# Patient Record
Sex: Female | Born: 1945 | Race: White | Hispanic: No | Smoking: Former smoker
Health system: Southern US, Community
[De-identification: ages and names within clinical notes are randomized; demographics above are authoritative.]

## PROBLEM LIST (undated history)

## (undated) ENCOUNTER — Emergency Department (HOSPITAL_BASED_OUTPATIENT_CLINIC_OR_DEPARTMENT_OTHER): Payer: Medicare Other

## (undated) DIAGNOSIS — M1990 Unspecified osteoarthritis, unspecified site: Secondary | ICD-10-CM

## (undated) DIAGNOSIS — E785 Hyperlipidemia, unspecified: Secondary | ICD-10-CM

## (undated) DIAGNOSIS — G609 Hereditary and idiopathic neuropathy, unspecified: Secondary | ICD-10-CM

## (undated) DIAGNOSIS — G629 Polyneuropathy, unspecified: Secondary | ICD-10-CM

## (undated) DIAGNOSIS — R519 Headache, unspecified: Secondary | ICD-10-CM

## (undated) DIAGNOSIS — T7840XA Allergy, unspecified, initial encounter: Secondary | ICD-10-CM

## (undated) DIAGNOSIS — I10 Essential (primary) hypertension: Secondary | ICD-10-CM

## (undated) DIAGNOSIS — R51 Headache: Secondary | ICD-10-CM

## (undated) DIAGNOSIS — H269 Unspecified cataract: Secondary | ICD-10-CM

## (undated) DIAGNOSIS — F1190 Opioid use, unspecified, uncomplicated: Secondary | ICD-10-CM

## (undated) DIAGNOSIS — E876 Hypokalemia: Secondary | ICD-10-CM

## (undated) DIAGNOSIS — Z973 Presence of spectacles and contact lenses: Secondary | ICD-10-CM

## (undated) HISTORY — PX: EYE SURGERY: SHX253

## (undated) HISTORY — DX: Unspecified cataract: H26.9

## (undated) HISTORY — DX: Polyneuropathy, unspecified: G62.9

## (undated) HISTORY — DX: Essential (primary) hypertension: I10

## (undated) HISTORY — PX: SHOULDER OPEN ROTATOR CUFF REPAIR: SHX2407

## (undated) HISTORY — DX: Hyperlipidemia, unspecified: E78.5

## (undated) HISTORY — PX: KNEE ARTHROSCOPY: SUR90

## (undated) HISTORY — PX: ABDOMINAL HYSTERECTOMY: SHX81

## (undated) HISTORY — DX: Allergy, unspecified, initial encounter: T78.40XA

## (undated) HISTORY — PX: TONSILLECTOMY: SUR1361

## (undated) HISTORY — PX: CATARACT EXTRACTION: SUR2

---

## 2011-04-05 DIAGNOSIS — E785 Hyperlipidemia, unspecified: Secondary | ICD-10-CM | POA: Diagnosis not present

## 2011-04-05 DIAGNOSIS — Z79899 Other long term (current) drug therapy: Secondary | ICD-10-CM | POA: Diagnosis not present

## 2011-04-05 DIAGNOSIS — J209 Acute bronchitis, unspecified: Secondary | ICD-10-CM | POA: Diagnosis not present

## 2011-04-20 DIAGNOSIS — IMO0002 Reserved for concepts with insufficient information to code with codable children: Secondary | ICD-10-CM | POA: Diagnosis not present

## 2011-05-17 DIAGNOSIS — J209 Acute bronchitis, unspecified: Secondary | ICD-10-CM | POA: Diagnosis not present

## 2011-05-29 DIAGNOSIS — G589 Mononeuropathy, unspecified: Secondary | ICD-10-CM | POA: Diagnosis not present

## 2011-05-29 DIAGNOSIS — J209 Acute bronchitis, unspecified: Secondary | ICD-10-CM | POA: Diagnosis not present

## 2011-07-18 DIAGNOSIS — Z1272 Encounter for screening for malignant neoplasm of vagina: Secondary | ICD-10-CM | POA: Diagnosis not present

## 2011-07-18 DIAGNOSIS — Z01419 Encounter for gynecological examination (general) (routine) without abnormal findings: Secondary | ICD-10-CM | POA: Diagnosis not present

## 2011-08-14 DIAGNOSIS — E785 Hyperlipidemia, unspecified: Secondary | ICD-10-CM | POA: Diagnosis not present

## 2011-08-14 DIAGNOSIS — I10 Essential (primary) hypertension: Secondary | ICD-10-CM | POA: Diagnosis not present

## 2011-08-29 DIAGNOSIS — G609 Hereditary and idiopathic neuropathy, unspecified: Secondary | ICD-10-CM | POA: Diagnosis not present

## 2011-08-29 DIAGNOSIS — M1990 Unspecified osteoarthritis, unspecified site: Secondary | ICD-10-CM | POA: Diagnosis not present

## 2011-09-27 DIAGNOSIS — M1990 Unspecified osteoarthritis, unspecified site: Secondary | ICD-10-CM | POA: Diagnosis not present

## 2011-09-27 DIAGNOSIS — G609 Hereditary and idiopathic neuropathy, unspecified: Secondary | ICD-10-CM | POA: Diagnosis not present

## 2011-09-27 DIAGNOSIS — Z79899 Other long term (current) drug therapy: Secondary | ICD-10-CM | POA: Diagnosis not present

## 2011-10-25 DIAGNOSIS — E8941 Symptomatic postprocedural ovarian failure: Secondary | ICD-10-CM | POA: Diagnosis not present

## 2011-11-01 DIAGNOSIS — Z1231 Encounter for screening mammogram for malignant neoplasm of breast: Secondary | ICD-10-CM | POA: Diagnosis not present

## 2011-11-02 DIAGNOSIS — H18519 Endothelial corneal dystrophy, unspecified eye: Secondary | ICD-10-CM | POA: Diagnosis not present

## 2011-11-02 DIAGNOSIS — H524 Presbyopia: Secondary | ICD-10-CM | POA: Diagnosis not present

## 2011-11-02 DIAGNOSIS — H3129 Other hereditary choroidal dystrophy: Secondary | ICD-10-CM | POA: Diagnosis not present

## 2011-11-09 DIAGNOSIS — E559 Vitamin D deficiency, unspecified: Secondary | ICD-10-CM | POA: Diagnosis not present

## 2011-11-09 DIAGNOSIS — E785 Hyperlipidemia, unspecified: Secondary | ICD-10-CM | POA: Diagnosis not present

## 2011-11-13 DIAGNOSIS — I10 Essential (primary) hypertension: Secondary | ICD-10-CM | POA: Diagnosis not present

## 2011-11-13 DIAGNOSIS — E785 Hyperlipidemia, unspecified: Secondary | ICD-10-CM | POA: Diagnosis not present

## 2011-11-16 DIAGNOSIS — Z1212 Encounter for screening for malignant neoplasm of rectum: Secondary | ICD-10-CM | POA: Diagnosis not present

## 2011-11-23 DIAGNOSIS — L820 Inflamed seborrheic keratosis: Secondary | ICD-10-CM | POA: Diagnosis not present

## 2012-03-13 DIAGNOSIS — Z79899 Other long term (current) drug therapy: Secondary | ICD-10-CM | POA: Diagnosis not present

## 2012-03-13 DIAGNOSIS — G609 Hereditary and idiopathic neuropathy, unspecified: Secondary | ICD-10-CM | POA: Diagnosis not present

## 2012-03-13 DIAGNOSIS — M1990 Unspecified osteoarthritis, unspecified site: Secondary | ICD-10-CM | POA: Diagnosis not present

## 2012-04-11 DIAGNOSIS — H109 Unspecified conjunctivitis: Secondary | ICD-10-CM | POA: Diagnosis not present

## 2012-05-22 DIAGNOSIS — M1990 Unspecified osteoarthritis, unspecified site: Secondary | ICD-10-CM | POA: Diagnosis not present

## 2012-05-22 DIAGNOSIS — G609 Hereditary and idiopathic neuropathy, unspecified: Secondary | ICD-10-CM | POA: Diagnosis not present

## 2012-05-22 DIAGNOSIS — Z79899 Other long term (current) drug therapy: Secondary | ICD-10-CM | POA: Diagnosis not present

## 2012-06-12 DIAGNOSIS — M1990 Unspecified osteoarthritis, unspecified site: Secondary | ICD-10-CM | POA: Diagnosis not present

## 2012-06-12 DIAGNOSIS — Z79899 Other long term (current) drug therapy: Secondary | ICD-10-CM | POA: Diagnosis not present

## 2012-06-12 DIAGNOSIS — G609 Hereditary and idiopathic neuropathy, unspecified: Secondary | ICD-10-CM | POA: Diagnosis not present

## 2012-07-02 DIAGNOSIS — M79609 Pain in unspecified limb: Secondary | ICD-10-CM | POA: Diagnosis not present

## 2012-07-02 DIAGNOSIS — E1142 Type 2 diabetes mellitus with diabetic polyneuropathy: Secondary | ICD-10-CM | POA: Diagnosis not present

## 2012-07-17 ENCOUNTER — Ambulatory Visit (INDEPENDENT_AMBULATORY_CARE_PROVIDER_SITE_OTHER): Payer: Medicare Other | Admitting: Nurse Practitioner

## 2012-07-17 ENCOUNTER — Encounter: Payer: Self-pay | Admitting: Nurse Practitioner

## 2012-07-17 VITALS — BP 117/67 | HR 69 | Temp 99.0°F | Ht 68.4 in | Wt 191.0 lb

## 2012-07-17 DIAGNOSIS — G609 Hereditary and idiopathic neuropathy, unspecified: Secondary | ICD-10-CM

## 2012-07-17 DIAGNOSIS — I10 Essential (primary) hypertension: Secondary | ICD-10-CM | POA: Diagnosis not present

## 2012-07-17 DIAGNOSIS — Z23 Encounter for immunization: Secondary | ICD-10-CM

## 2012-07-17 DIAGNOSIS — E785 Hyperlipidemia, unspecified: Secondary | ICD-10-CM

## 2012-07-17 DIAGNOSIS — G43909 Migraine, unspecified, not intractable, without status migrainosus: Secondary | ICD-10-CM | POA: Diagnosis not present

## 2012-07-17 DIAGNOSIS — E876 Hypokalemia: Secondary | ICD-10-CM

## 2012-07-17 DIAGNOSIS — E782 Mixed hyperlipidemia: Secondary | ICD-10-CM | POA: Insufficient documentation

## 2012-07-17 LAB — COMPLETE METABOLIC PANEL WITH GFR
ALT: 30 U/L (ref 0–35)
AST: 26 U/L (ref 0–37)
Albumin: 4.2 g/dL (ref 3.5–5.2)
Alkaline Phosphatase: 59 U/L (ref 39–117)
BUN: 18 mg/dL (ref 6–23)
CO2: 31 mEq/L (ref 19–32)
Calcium: 9.5 mg/dL (ref 8.4–10.5)
Chloride: 102 mEq/L (ref 96–112)
Creat: 0.82 mg/dL (ref 0.50–1.10)
GFR, Est African American: 86 mL/min
GFR, Est Non African American: 75 mL/min
Glucose, Bld: 102 mg/dL — ABNORMAL HIGH (ref 70–99)
Potassium: 3.8 mEq/L (ref 3.5–5.3)
Sodium: 141 mEq/L (ref 135–145)
Total Bilirubin: 1.2 mg/dL (ref 0.3–1.2)
Total Protein: 5.9 g/dL — ABNORMAL LOW (ref 6.0–8.3)

## 2012-07-17 MED ORDER — ATORVASTATIN CALCIUM 40 MG PO TABS: 40.0000 mg | ORAL_TABLET | Freq: Every day | ORAL | Status: DC

## 2012-07-17 MED ORDER — ATENOLOL 50 MG PO TABS: 50.0000 mg | ORAL_TABLET | Freq: Every day | ORAL | Status: DC

## 2012-07-17 MED ORDER — POTASSIUM CHLORIDE CRYS ER 20 MEQ PO TBCR: 20.0000 meq | EXTENDED_RELEASE_TABLET | Freq: Every day | ORAL | Status: DC

## 2012-07-17 MED ORDER — TRIAMTERENE-HCTZ 37.5-25 MG PO TABS: 1.0000 | ORAL_TABLET | Freq: Every day | ORAL | Status: DC

## 2012-07-17 NOTE — Patient Instructions (Addendum)
Health Maintenance, Females A healthy lifestyle and preventative care can promote health and wellness.  Maintain regular health, dental, and eye exams.  Eat a healthy diet. Foods like vegetables, fruits, whole grains, low-fat dairy products, and lean protein foods contain the nutrients you need without too many calories. Decrease your intake of foods high in solid fats, added sugars, and salt. Get information about a proper diet from your caregiver, if necessary.  Regular physical exercise is one of the most important things you can do for your health. Most adults should get at least 150 minutes of moderate-intensity exercise (any activity that increases your heart rate and causes you to sweat) each week. In addition, most adults need muscle-strengthening exercises on 2 or more days a week.   Maintain a healthy weight. The body mass index (BMI) is a screening tool to identify possible weight problems. It provides an estimate of body fat based on height and weight. Your caregiver can help determine your BMI, and can help you achieve or maintain a healthy weight. For adults 20 years and older:  A BMI below 18.5 is considered underweight.  A BMI of 18.5 to 24.9 is normal.  A BMI of 25 to 29.9 is considered overweight.  A BMI of 30 and above is considered obese.  Maintain normal blood lipids and cholesterol by exercising and minimizing your intake of saturated fat. Eat a balanced diet with plenty of fruits and vegetables. Blood tests for lipids and cholesterol should begin at age 20 and be repeated every 5 years. If your lipid or cholesterol levels are high, you are over 50, or you are a high risk for heart disease, you may need your cholesterol levels checked more frequently.Ongoing high lipid and cholesterol levels should be treated with medicines if diet and exercise are not effective.  If you smoke, find out from your caregiver how to quit. If you do not use tobacco, do not start.  If you  are pregnant, do not drink alcohol. If you are breastfeeding, be very cautious about drinking alcohol. If you are not pregnant and choose to drink alcohol, do not exceed 1 drink per day. One drink is considered to be 12 ounces (355 mL) of beer, 5 ounces (148 mL) of wine, or 1.5 ounces (44 mL) of liquor.  Avoid use of street drugs. Do not share needles with anyone. Ask for help if you need support or instructions about stopping the use of drugs.  High blood pressure causes heart disease and increases the risk of stroke. Blood pressure should be checked at least every 1 to 2 years. Ongoing high blood pressure should be treated with medicines, if weight loss and exercise are not effective.  If you are 55 to 66 years old, ask your caregiver if you should take aspirin to prevent strokes.  Diabetes screening involves taking a blood sample to check your fasting blood sugar level. This should be done once every 3 years, after age 45, if you are within normal weight and without risk factors for diabetes. Testing should be considered at a younger age or be carried out more frequently if you are overweight and have at least 1 risk factor for diabetes.  Breast cancer screening is essential preventative care for women. You should practice "breast self-awareness." This means understanding the normal appearance and feel of your breasts and may include breast self-examination. Any changes detected, no matter how small, should be reported to a caregiver. Women in their 20s and 30s should have   a clinical breast exam (CBE) by a caregiver as part of a regular health exam every 1 to 3 years. After age 40, women should have a CBE every year. Starting at age 40, women should consider having a mammogram (breast X-ray) every year. Women who have a family history of breast cancer should talk to their caregiver about genetic screening. Women at a high risk of breast cancer should talk to their caregiver about having an MRI and a  mammogram every year.  The Pap test is a screening test for cervical cancer. Women should have a Pap test starting at age 21. Between ages 21 and 29, Pap tests should be repeated every 2 years. Beginning at age 30, you should have a Pap test every 3 years as long as the past 3 Pap tests have been normal. If you had a hysterectomy for a problem that was not cancer or a condition that could lead to cancer, then you no longer need Pap tests. If you are between ages 65 and 70, and you have had normal Pap tests going back 10 years, you no longer need Pap tests. If you have had past treatment for cervical cancer or a condition that could lead to cancer, you need Pap tests and screening for cancer for at least 20 years after your treatment. If Pap tests have been discontinued, risk factors (such as a new sexual partner) need to be reassessed to determine if screening should be resumed. Some women have medical problems that increase the chance of getting cervical cancer. In these cases, your caregiver may recommend more frequent screening and Pap tests.  The human papillomavirus (HPV) test is an additional test that may be used for cervical cancer screening. The HPV test looks for the virus that can cause the cell changes on the cervix. The cells collected during the Pap test can be tested for HPV. The HPV test could be used to screen women aged 30 years and older, and should be used in women of any age who have unclear Pap test results. After the age of 30, women should have HPV testing at the same frequency as a Pap test.  Colorectal cancer can be detected and often prevented. Most routine colorectal cancer screening begins at the age of 50 and continues through age 75. However, your caregiver may recommend screening at an earlier age if you have risk factors for colon cancer. On a yearly basis, your caregiver may provide home test kits to check for hidden blood in the stool. Use of a small camera at the end of a  tube, to directly examine the colon (sigmoidoscopy or colonoscopy), can detect the earliest forms of colorectal cancer. Talk to your caregiver about this at age 50, when routine screening begins. Direct examination of the colon should be repeated every 5 to 10 years through age 75, unless early forms of pre-cancerous polyps or small growths are found.  Hepatitis C blood testing is recommended for all people born from 1945 through 1965 and any individual with known risks for hepatitis C.  Practice safe sex. Use condoms and avoid high-risk sexual practices to reduce the spread of sexually transmitted infections (STIs). Sexually active women aged 25 and younger should be checked for Chlamydia, which is a common sexually transmitted infection. Older women with new or multiple partners should also be tested for Chlamydia. Testing for other STIs is recommended if you are sexually active and at increased risk.  Osteoporosis is a disease in which the   bones lose minerals and strength with aging. This can result in serious bone fractures. The risk of osteoporosis can be identified using a bone density scan. Women ages 63 and over and women at risk for fractures or osteoporosis should discuss screening with their caregivers. Ask your caregiver whether you should be taking a calcium supplement or vitamin D to reduce the rate of osteoporosis.  Menopause can be associated with physical symptoms and risks. Hormone replacement therapy is available to decrease symptoms and risks. You should talk to your caregiver about whether hormone replacement therapy is right for you.  Use sunscreen with a sun protection factor (SPF) of 30 or greater. Apply sunscreen liberally and repeatedly throughout the day. You should seek shade when your shadow is shorter than you. Protect yourself by wearing long sleeves, pants, a wide-brimmed hat, and sunglasses year round, whenever you are outdoors.  Notify your caregiver of new moles or  changes in moles, especially if there is a change in shape or color. Also notify your caregiver if a mole is larger than the size of a pencil eraser.  Stay current with your immunizations. Document Released: 09/26/2010 Document Revised: 06/05/2011 Document Reviewed: 09/26/2010 [facility] Patient Information 2013 [place], [place]. Pneumococcal Vaccine, Polyvalent solution for injection What is this medicine? PNEUMOCOCCAL VACCINE, POLYVALENT (NEU mo KOK al vak SEEN, pol ee VEY luhnt) is a vaccine to prevent pneumococcus bacteria infection. These bacteria are a major cause of ear infections, Strep throat infections, and serious pneumonia, meningitis, or blood infections worldwide. These vaccines help the body to produce antibodies (protective substances) that help your body defend against these bacteria. This vaccine is recommended for people 86 years of age and older with health problems. It is also recommended for all adults over 57 years old. This vaccine will not treat an infection. This medicine may be used for other purposes; ask your health care provider or pharmacist if you have questions. What should I tell my health care provider before I take this medicine? They need to know if you have any of these conditions: -bleeding problems -bone marrow or organ transplant -cancer, Hodgkin's disease -fever -infection -immune system problems -low platelet count in the blood -seizures -an unusual or allergic reaction to pneumococcal vaccine, diphtheria toxoid, other vaccines, latex, other medicines, foods, dyes, or preservatives -pregnant or trying to get pregnant -breast-feeding How should I use this medicine? This vaccine is for injection into a muscle or under the skin. It is given by a health care professional. A copy of Vaccine Information Statements will be given before each vaccination. Read this sheet carefully each time. The sheet may change frequently. Talk to your pediatrician regarding  the use of this medicine in children. While this drug may be prescribed for children as young as 67 years of age for selected conditions, precautions do apply. Overdosage: If you think you have taken too much of this medicine contact a poison control center or emergency room at once. NOTE: This medicine is only for you. Do not share this medicine with others. What if I miss a dose? It is important not to miss your dose. Call your doctor or health care professional if you are unable to keep an appointment. What may interact with this medicine? -medicines for cancer chemotherapy -medicines that suppress your immune function -medicines that treat or prevent blood clots like warfarin, enoxaparin, and dalteparin -steroid medicines like prednisone or cortisone This list may not describe all possible interactions. Give your health care provider a list of  all the medicines, herbs, non-prescription drugs, or dietary supplements you use. Also tell them if you smoke, drink alcohol, or use illegal drugs. Some items may interact with your medicine. What should I watch for while using this medicine? Mild fever and pain should go away in 3 days or less. Report any unusual symptoms to your doctor or health care professional. What side effects may I notice from receiving this medicine? Side effects that you should report to your doctor or health care professional as soon as possible: -allergic reactions like skin rash, itching or hives, swelling of the face, lips, or tongue -breathing problems -confused -fever over 102 degrees F -pain, tingling, numbness in the hands or feet -seizures -unusual bleeding or bruising -unusual muscle weakness Side effects that usually do not require medical attention (report to your doctor or health care professional if they continue or are bothersome): -aches and pains -diarrhea -fever of 102 degrees F or less -headache -irritable -loss of appetite -pain, tender at site  where injected -trouble sleeping This list may not describe all possible side effects. Call your doctor for medical advice about side effects. You may report side effects to FDA at 1-800-FDA-1088. Where should I keep my medicine? This does not apply. This vaccine is given in a clinic, pharmacy, doctor's office, or other health care setting and will not be stored at home. NOTE: This sheet is a summary. It may not cover all possible information. If you have questions about this medicine, talk to your doctor, pharmacist, or health care provider.  2013, [employer]. (10/18/2007 2:32:37 PM)

## 2012-07-17 NOTE — Progress Notes (Signed)
Subjective:    Patient ID: Brittany Mercer, female    DOB: 03-06-1946, 67 y.o.   MRN: [id number]  Hypertension This is a chronic problem. The current episode started more than 1 year ago. The problem is unchanged. The problem is controlled. Pertinent negatives include no blurred vision, chest pain, headaches, neck pain, palpitations, peripheral edema or shortness of breath. There are no associated agents to hypertension. Risk factors for coronary artery disease include dyslipidemia, post-menopausal state and stress. Past treatments include beta blockers and diuretics. The current treatment provides significant improvement. There are no compliance problems.   Hyperlipidemia This is a chronic problem. The current episode started more than 1 year ago. The problem is controlled. Recent lipid tests were reviewed and are normal. There are no known factors aggravating her hyperlipidemia. Pertinent negatives include no chest pain, focal weakness, leg pain, myalgias or shortness of breath. The current treatment provides significant improvement of lipids. There are no compliance problems.  Risk factors for coronary artery disease include hypertension and post-menopausal.  Hypokalemia Patient takes K+ daily. No c/o cramps in lower extremities Peripheral Neuropathy Nucyenta and oxycodone- Doing well- Patient in fight with ins to get nucyenta approved. Nucynta works better than oxycodone without the side effects.   Review of Systems  HENT: Negative for neck pain.   Eyes: Negative for blurred vision.  Respiratory: Negative for shortness of breath.   Cardiovascular: Negative for chest pain and palpitations.  Musculoskeletal: Negative for myalgias.  Neurological: Negative for focal weakness and headaches.  All other systems reviewed and are negative.       Objective:   Physical Exam  Constitutional: She is oriented to person, place, and time. She appears well-developed and well-nourished.  HENT:   Nose: Nose normal.  Mouth/Throat: Oropharynx is clear and moist.  Eyes: EOM are normal.  Neck: Trachea normal, normal range of motion and full passive range of motion without pain. Neck supple. No JVD present. Carotid bruit is not present. No thyromegaly present.  Cardiovascular: Normal rate, regular rhythm, normal heart sounds and intact distal pulses.  Exam reveals no gallop and no friction rub.   No murmur heard. Pulmonary/Chest: Effort normal and breath sounds normal.  Abdominal: Soft. Bowel sounds are normal. She exhibits no distension and no mass. There is no tenderness.  Musculoskeletal: Normal range of motion.  Lymphadenopathy:    She has no cervical adenopathy.  Neurological: She is alert and oriented to person, place, and time. She has normal reflexes.  Skin: Skin is warm and dry.  Psychiatric: She has a normal mood and affect. Her behavior is normal. Judgment and thought content normal.   BP 117/67  Pulse 69  Temp(Src) 99 F (37.2 C) (Oral)  Ht 5' 8.4" (1.737 m)  Wt 191 lb (86.637 kg)  BMI 28.71 kg/m2        Assessment & Plan:  1. Essential hypertension, benign Low Na+ diet - COMPLETE METABOLIC PANEL WITH GFR - triamterene-hydrochlorothiazide (MAXZIDE-25) 37.5-25 MG per tablet; Take 1 each (1 tablet total) by mouth daily.  Dispense: 90 tablet; Refill: 1 - atenolol (TENORMIN) 50 MG tablet; Take 1 tablet (50 mg total) by mouth daily.  Dispense: 90 tablet; Refill: 1  2. Hyperlipidemia with target LDL less than 100 Low fat diet and exercise - NMR Lipoprofile with Lipids - atorvastatin (LIPITOR) 40 MG tablet; Take 1 tablet (40 mg total) by mouth daily.  Dispense: 90 tablet; Refill: 1  3. Migraines Avoid caffeine Keep headache diary  4. Unspecified hereditary and  idiopathic peripheral neuropathy Patient will take topamax when needed Nucynta as needed  5. Hypokalemia  - potassium chloride SA (K-DUR,KLOR-CON) 20 MEQ tablet; Take 1 tablet (20 mEq total) by mouth  daily.  Dispense: 90 tablet; Refill: 1  6. Immunization due  - Pneumococcal polysaccharide vaccine 23-valent greater than or equal to 2yo subcutaneous/IM; Standing Mary-Margaret Daphine Deutscher, FNP

## 2012-07-18 LAB — NMR LIPOPROFILE WITH LIPIDS
Cholesterol, Total: 141 mg/dL (ref <200)
HDL Particle Number: 28.7 umol/L — ABNORMAL LOW (ref >=30.5)
HDL Size: 8.6 nm — ABNORMAL LOW (ref >=9.2)
HDL-C: 37 mg/dL — ABNORMAL LOW (ref >=40)
LDL (calc): 81 mg/dL (ref <100)
LDL Particle Number: 1420 nmol/L — ABNORMAL HIGH (ref <1000)
LDL Size: 20.2 nm — ABNORMAL LOW (ref >20.5)
LP-IR Score: 73 — ABNORMAL HIGH (ref <=45)
Large HDL-P: 3.1 umol/L — ABNORMAL LOW (ref >=4.8)
Large VLDL-P: 3.9 nmol/L — ABNORMAL HIGH (ref <=2.7)
Small LDL Particle Number: 883 nmol/L — ABNORMAL HIGH (ref <=527)
Triglycerides: 116 mg/dL (ref <150)
VLDL Size: 49.6 nm — ABNORMAL HIGH (ref <=46.6)

## 2012-07-23 ENCOUNTER — Other Ambulatory Visit: Payer: Self-pay | Admitting: Nurse Practitioner

## 2012-08-21 DIAGNOSIS — Z79899 Other long term (current) drug therapy: Secondary | ICD-10-CM | POA: Diagnosis not present

## 2012-08-21 DIAGNOSIS — G609 Hereditary and idiopathic neuropathy, unspecified: Secondary | ICD-10-CM | POA: Diagnosis not present

## 2012-08-21 DIAGNOSIS — M1990 Unspecified osteoarthritis, unspecified site: Secondary | ICD-10-CM | POA: Diagnosis not present

## 2012-10-23 DIAGNOSIS — Z79899 Other long term (current) drug therapy: Secondary | ICD-10-CM | POA: Diagnosis not present

## 2012-10-23 DIAGNOSIS — M1990 Unspecified osteoarthritis, unspecified site: Secondary | ICD-10-CM | POA: Diagnosis not present

## 2012-10-23 DIAGNOSIS — G609 Hereditary and idiopathic neuropathy, unspecified: Secondary | ICD-10-CM | POA: Diagnosis not present

## 2012-11-27 DIAGNOSIS — Z79899 Other long term (current) drug therapy: Secondary | ICD-10-CM | POA: Diagnosis not present

## 2012-11-27 DIAGNOSIS — M1990 Unspecified osteoarthritis, unspecified site: Secondary | ICD-10-CM | POA: Diagnosis not present

## 2012-11-27 DIAGNOSIS — G609 Hereditary and idiopathic neuropathy, unspecified: Secondary | ICD-10-CM | POA: Diagnosis not present

## 2012-12-12 DIAGNOSIS — H2510 Age-related nuclear cataract, unspecified eye: Secondary | ICD-10-CM | POA: Diagnosis not present

## 2013-01-09 DIAGNOSIS — Z79899 Other long term (current) drug therapy: Secondary | ICD-10-CM | POA: Diagnosis not present

## 2013-02-17 ENCOUNTER — Encounter (INDEPENDENT_AMBULATORY_CARE_PROVIDER_SITE_OTHER): Payer: Self-pay

## 2013-02-17 ENCOUNTER — Ambulatory Visit (INDEPENDENT_AMBULATORY_CARE_PROVIDER_SITE_OTHER): Payer: Medicare Other | Admitting: Family Medicine

## 2013-02-17 ENCOUNTER — Encounter: Payer: Self-pay | Admitting: Family Medicine

## 2013-02-17 VITALS — BP 116/67 | HR 73 | Temp 99.5°F | Ht 68.4 in | Wt 188.0 lb

## 2013-02-17 DIAGNOSIS — R300 Dysuria: Secondary | ICD-10-CM | POA: Diagnosis not present

## 2013-02-17 DIAGNOSIS — Z23 Encounter for immunization: Secondary | ICD-10-CM

## 2013-02-17 LAB — POCT URINALYSIS DIPSTICK
Bilirubin, UA: NEGATIVE
Blood, UA: NEGATIVE
Glucose, UA: NEGATIVE
Ketones, UA: NEGATIVE
Nitrite, UA: NEGATIVE
Protein, UA: NEGATIVE
Spec Grav, UA: <=1.005
Urobilinogen, UA: NEGATIVE
pH, UA: 7.5

## 2013-02-17 LAB — POCT UA - MICROSCOPIC ONLY
Bacteria, U Microscopic: NEGATIVE
Casts, Ur, LPF, POC: NEGATIVE
Crystals, Ur, HPF, POC: NEGATIVE
Mucus, UA: NEGATIVE
RBC, urine, microscopic: NEGATIVE
Yeast, UA: NEGATIVE

## 2013-02-17 MED ORDER — CIPROFLOXACIN HCL 500 MG PO TABS: 500.0000 mg | ORAL_TABLET | Freq: Two times a day (BID) | ORAL | Status: DC

## 2013-02-17 NOTE — Addendum Note (Signed)
Addended by: Gwenith Daily on: 02/17/2013 02:10 PM   Modules accepted: Orders

## 2013-02-17 NOTE — Progress Notes (Signed)
  Subjective:    Patient ID: Brittany Mercer, female    DOB: 1945-12-10, 67 y.o.   MRN: [id number]  HPI DYSURIA Onset:  1 week  Description: dysuria, increased urinary frequency  Modifying factors: none   Symptoms Urgency:  yes Frequency: yes  Hesitancy:  yes Hematuria:  no Flank Pain:  no Fever: no Nausea/Vomiting:  no Missed LMP: no STD exposure: no Discharge: no Irritants: no Rash: no  Red Flags   More than 3 UTI's last 12 months:  no PMH of  Diabetes or Immunosuppression:  no Renal Disease/Calculi: no Urinary Tract Abnormality:  no Instrumentation or Trauma: no      Review of Systems  All other systems reviewed and are negative.       Objective:   Physical Exam  Constitutional: She is oriented to person, place, and time. She appears well-developed and well-nourished.  HENT:  Head: Normocephalic and atraumatic.  Eyes: Conjunctivae are normal. Pupils are equal, round, and reactive to light.  Neck: Normal range of motion. Neck supple.  Cardiovascular: Normal rate and regular rhythm.   Pulmonary/Chest: Effort normal and breath sounds normal.  Abdominal: Soft. Bowel sounds are normal.  No flank pain  Minimal suprapubic tenderness   Musculoskeletal: Normal range of motion.  Neurological: She is alert and oriented to person, place, and time.  Skin: Skin is warm.    Urinalysis    Component Value Date/Time   BILIRUBINUR NEG 02/17/2013 1158   UROBILINOGEN negative 02/17/2013 1158   NITRITE NEG 02/17/2013 1158   LEUKOCYTESUR moderate (2+) 02/17/2013 1158           Assessment & Plan:  Dysuria - Plan: POCT urinalysis dipstick, POCT UA - Microscopic Only, ciprofloxacin (CIPRO) 500 MG tablet  Will treat with cipro  Urine culture No red flags on exam today.  Discussed supportive care and GU red flags.  Follow up as needed.

## 2013-02-17 NOTE — Patient Instructions (Signed)
Influenza Vaccine (Flu Vaccine, Inactivated) 2013 2014 What You Need to Know WHY GET VACCINATED?  Influenza ("flu") is a contagious disease that spreads around the [place] every winter, usually between October and May.  Flu is caused by the influenza virus, and can be spread by coughing, sneezing, and close contact.  Anyone can get flu, but the risk of getting flu is highest among children. Symptoms come on suddenly and may last several days. They can include:  Fever or chills.  Sore throat.  Muscle aches.  Fatigue.  Cough.  Headache.  Runny or stuffy nose. Flu can make some people much sicker than others. These people include young children, people 65 and older, pregnant women, and people with certain health conditions such as heart, lung or kidney disease, or a weakened immune system. Flu vaccine is especially important for these people, and anyone in close contact with them. Flu can also lead to pneumonia, and make existing medical conditions worse. It can cause diarrhea and seizures in children. Each year thousands of people in the [place] die from flu, and many more are hospitalized. Flu vaccine is the best protection we have from flu and its complications. Flu vaccine also helps prevent spreading flu from person to person. INACTIVATED FLU VACCINE There are 2 types of influenza vaccine:  You are getting an inactivated flu vaccine, which does not contain any live influenza virus. It is given by injection with a needle, and often called the "flu shot."  A different live, attenuated (weakened) influenza vaccine is sprayed into the nostrils. This vaccine is described in a separate Vaccine Information Statement. Flu vaccine is recommended every year. Children 6 months through 8 years of age should get 2 doses the first year they get vaccinated. Flu viruses are always changing. Each year's flu vaccine is made to protect from viruses that are most likely to cause disease  that year. While flu vaccine cannot prevent all cases of flu, it is our best defense against the disease. Inactivated flu vaccine protects against 3 or 4 different influenza viruses. It takes about 2 weeks for protection to develop after the vaccination, and protection lasts several months to a year. Some illnesses that are not caused by influenza virus are often mistaken for flu. Flu vaccine will not prevent these illnesses. It can only prevent influenza. A "high-dose" flu vaccine is available for people 65 years of age and older. The person giving you the vaccine can tell you more about it. Some inactivated flu vaccine contains a very small amount of a mercury-based preservative called thimerosal. Studies have shown that thimerosal in vaccines is not harmful, but flu vaccines that do not contain a preservative are available. SOME PEOPLE SHOULD NOT GET THIS VACCINE Tell the person who gives you the vaccine:  If you have any severe (life-threatening) allergies. If you ever had a life-threatening allergic reaction after a dose of flu vaccine, or have a severe allergy to any part of this vaccine, you may be advised not to get a dose. Most, but not all, types of flu vaccine contain a small amount of egg.  If you ever had Guillain Barr Syndrome (a severe paralyzing illness, also called GBS). Some people with a history of GBS should not get this vaccine. This should be discussed with your doctor.  If you are not feeling well. They might suggest waiting until you feel better. But you should come back. RISKS OF A VACCINE REACTION With a vaccine, like any medicine, there   If you are not feeling well. They might suggest waiting until you feel better. But you should come back.  RISKS OF A VACCINE REACTION  With a vaccine, like any medicine, there is a chance of side effects. These are usually mild and go away on their own.  Serious side effects are also possible, but are very rare. Inactivated flu vaccine does not contain live flu virus, sogetting flu from this vaccine is not possible.  Brief fainting spells and related symptoms (such as jerking movements) can happen after any medical  procedure, including vaccination. Sitting or lying down for about 15 minutes after a vaccination can help prevent fainting and injuries caused by falls. Tell your doctor if you feel dizzy or lightheaded, or have vision changes or ringing in the ears.  Mild problems following inactivated flu vaccine:  · Soreness, redness, or swelling where the shot was given.  · Hoarseness; sore, red or itchy eyes; or cough.  · Fever.  · Aches.  · Headache.  · Itching.  · Fatigue.  If these problems occur, they usually begin soon after the shot and last 1 or 2 days.  Moderate problems following inactivated flu vaccine:  · Young children who get inactivated flu vaccine and pneumococcal vaccine (PCV13) at the same time may be at increased risk for seizures caused by fever. Ask your doctor for more information. Tell your doctor if a child who is getting flu vaccine has ever had a seizure.  Severe problems following inactivated flu vaccine:  · A severe allergic reaction could occur after any vaccine (estimated less than 1 in a million doses).  · There is a small possibility that inactivated flu vaccine could be associated with Guillan Barré Syndrome (GBS), no more than 1 or 2 cases per million people vaccinated. This is much lower than the risk of severe complications from flu, which can be prevented by flu vaccine.  The safety of vaccines is always being monitored. For more information, visit: www.cdc.gov/vaccinesafety/  WHAT IF THERE IS A SERIOUS REACTION?  What should I look for?  · Look for anything that concerns you, such as signs of a severe allergic reaction, very high fever, or behavior changes.  Signs of a severe allergic reaction can include hives, swelling of the face and throat, difficulty breathing, a fast heartbeat, dizziness, and weakness. These would start a few minutes to a few hours after the vaccination.  What should I do?  · If you think it is a severe allergic reaction or other emergency that cannot wait, call 9 1 1  or get the person to the nearest hospital. Otherwise, call your doctor.  · Afterward, the reaction should be reported to the Vaccine Adverse Event Reporting System (VAERS). Your doctor might file this report, or you can do it yourself through the VAERS website at www.vaers.hhs.gov, or by calling 1-800-822-7967.  VAERS is only for reporting reactions. They do not give medical advice.  THE NATIONAL VACCINE INJURY COMPENSATION PROGRAM  The National Vaccine Injury Compensation Program (VICP) is a federal program that was created to compensate people who may have been injured by certain vaccines.  Persons who believe they may have been injured by a vaccine can learn about the program and about filing a claim by calling 1-800-338-2382 or visiting the VICP website at www.hrsa.gov/vaccinecompensation  HOW CAN I LEARN MORE?  · Ask your doctor.  · Call your local or state health department.  · Contact the Centers for Disease Control and Prevention (CDC):  ·

## 2013-02-19 ENCOUNTER — Other Ambulatory Visit: Payer: Self-pay | Admitting: Nurse Practitioner

## 2013-02-19 LAB — URINE CULTURE

## 2013-02-25 ENCOUNTER — Telehealth: Payer: Self-pay | Admitting: *Deleted

## 2013-02-25 NOTE — Telephone Encounter (Signed)
Message copied by Gwenith Daily on Tue Feb 25, 2013  3:48 PM ------      Message from: Floydene Flock      Created: Sat Feb 22, 2013  9:23 AM       pansensitive ecoli      On cipro       Complete treatment course.        ------

## 2013-02-25 NOTE — Telephone Encounter (Signed)
Called to inform patient to continue antibiotic because culture showed that bacteria was sensitive to it. Unable to reach patient.

## 2013-02-26 NOTE — Telephone Encounter (Signed)
Patient completed course of antibiotics yesterday. Symptoms have resolved. She will contact us if they return.

## 2013-02-26 NOTE — Telephone Encounter (Signed)
Message copied by Gwenith Daily on Wed Feb 26, 2013  8:43 AM ------      Message from: Floydene Flock      Created: Sat Feb 22, 2013  9:23 AM       pansensitive ecoli      On cipro       Complete treatment course.        ------

## 2013-03-11 ENCOUNTER — Other Ambulatory Visit: Payer: Medicare Other | Admitting: Nurse Practitioner

## 2013-03-28 ENCOUNTER — Telehealth: Payer: Self-pay | Admitting: Family Medicine

## 2013-03-28 NOTE — Telephone Encounter (Signed)
Woke up with left elbow pain. Noticed redness with vesicular center. Has applied ice.  Patient was concerned about possible tick bite because area looked like a bullseye. Explained that the bullseye doesn't appear immediately after the bite and this bite sounded recent due to the blister formation.  She will continue to watch it. She can take advil and benadryl and apply ice for comfort. She is aware of Saturday clinic hours and that there is someone she can reach by phone when we are closed. If the area drastically worsens she should seek care at urgent care of ED.

## 2013-04-02 DIAGNOSIS — M1990 Unspecified osteoarthritis, unspecified site: Secondary | ICD-10-CM | POA: Diagnosis not present

## 2013-04-02 DIAGNOSIS — G609 Hereditary and idiopathic neuropathy, unspecified: Secondary | ICD-10-CM | POA: Diagnosis not present

## 2013-04-02 DIAGNOSIS — Z79899 Other long term (current) drug therapy: Secondary | ICD-10-CM | POA: Diagnosis not present

## 2013-04-08 ENCOUNTER — Ambulatory Visit (INDEPENDENT_AMBULATORY_CARE_PROVIDER_SITE_OTHER): Payer: Medicare Other | Admitting: Nurse Practitioner

## 2013-04-08 ENCOUNTER — Encounter: Payer: Self-pay | Admitting: Nurse Practitioner

## 2013-04-08 VITALS — BP 128/69 | HR 71 | Temp 99.1°F | Ht 68.4 in | Wt 180.0 lb

## 2013-04-08 DIAGNOSIS — Z124 Encounter for screening for malignant neoplasm of cervix: Secondary | ICD-10-CM | POA: Diagnosis not present

## 2013-04-08 DIAGNOSIS — E785 Hyperlipidemia, unspecified: Secondary | ICD-10-CM | POA: Diagnosis not present

## 2013-04-08 DIAGNOSIS — E876 Hypokalemia: Secondary | ICD-10-CM | POA: Diagnosis not present

## 2013-04-08 DIAGNOSIS — I10 Essential (primary) hypertension: Secondary | ICD-10-CM | POA: Diagnosis not present

## 2013-04-08 DIAGNOSIS — R21 Rash and other nonspecific skin eruption: Secondary | ICD-10-CM

## 2013-04-08 DIAGNOSIS — Z01419 Encounter for gynecological examination (general) (routine) without abnormal findings: Secondary | ICD-10-CM

## 2013-04-08 DIAGNOSIS — Z13820 Encounter for screening for osteoporosis: Secondary | ICD-10-CM

## 2013-04-08 DIAGNOSIS — Z0000 Encounter for general adult medical examination without abnormal findings: Secondary | ICD-10-CM

## 2013-04-08 LAB — POCT URINALYSIS DIPSTICK
Bilirubin, UA: NEGATIVE
Blood, UA: NEGATIVE
Glucose, UA: NEGATIVE
Ketones, UA: NEGATIVE
Leukocytes, UA: NEGATIVE
Nitrite, UA: NEGATIVE
Spec Grav, UA: 1.025
Urobilinogen, UA: NEGATIVE
pH, UA: 6

## 2013-04-08 LAB — POCT UA - MICROSCOPIC ONLY
Casts, Ur, LPF, POC: NEGATIVE
Crystals, Ur, HPF, POC: NEGATIVE
Yeast, UA: NEGATIVE

## 2013-04-08 LAB — POCT CBC
Granulocyte percent: 69.8 %G (ref 37–80)
HCT, POC: 43.2 % (ref 37.7–47.9)
Hemoglobin: 13.8 g/dL (ref 12.2–16.2)
Lymph, poc: 1.7 (ref 0.6–3.4)
MCH, POC: 30.3 pg (ref 27–31.2)
MCHC: 31.9 g/dL (ref 31.8–35.4)
MCV: 94.8 fL (ref 80–97)
MPV: 8.2 fL (ref 0–99.8)
POC Granulocyte: 4.5 (ref 2–6.9)
POC LYMPH PERCENT: 26.2 %L (ref 10–50)
Platelet Count, POC: 141 10*3/uL — AB (ref 142–424)
RBC: 4.6 M/uL (ref 4.04–5.48)
RDW, POC: 12.6 %
WBC: 6.5 10*3/uL (ref 4.6–10.2)

## 2013-04-08 MED ORDER — ATENOLOL 50 MG PO TABS: 50.0000 mg | ORAL_TABLET | Freq: Every day | ORAL | Status: DC

## 2013-04-08 MED ORDER — TRIAMTERENE-HCTZ 37.5-25 MG PO TABS: 1.0000 | ORAL_TABLET | Freq: Every day | ORAL | Status: DC

## 2013-04-08 MED ORDER — DOXYCYCLINE HYCLATE 100 MG PO TABS: 100.0000 mg | ORAL_TABLET | Freq: Two times a day (BID) | ORAL | Status: DC

## 2013-04-08 MED ORDER — ATORVASTATIN CALCIUM 40 MG PO TABS: 40.0000 mg | ORAL_TABLET | Freq: Every day | ORAL | Status: DC

## 2013-04-08 MED ORDER — POTASSIUM CHLORIDE CRYS ER 20 MEQ PO TBCR: 20.0000 meq | EXTENDED_RELEASE_TABLET | Freq: Once | ORAL | Status: DC

## 2013-04-08 NOTE — Patient Instructions (Signed)

## 2013-04-08 NOTE — Progress Notes (Signed)
Subjective:    Patient ID: Brittany Mercer, female    DOB: 1945-11-14, 69 y.o.   MRN: [id number]  HPI  Patient here today for annual physical exam and PAP- she is doing wll today- Her only complaint is a rash that started on face 1 week ago- Was 2 small pumps on left cheeka nd has now spread to red pumps bil cheeks. Says that the face burns. Patient Active Problem List   Diagnosis Date Noted  . Essential hypertension, benign 07/17/2012  . Hyperlipidemia with target LDL less than 100 07/17/2012  . Migraines 07/17/2012  . Unspecified hereditary and idiopathic peripheral neuropathy 07/17/2012  . Hypokalemia 07/17/2012   Outpatient Encounter Prescriptions as of 04/08/2013  Medication Sig  . atenolol (TENORMIN) 50 MG tablet TAKE 1 TABLET ONCE A DAY  . atorvastatin (LIPITOR) 40 MG tablet TAKE 1 TABLET DAILY  . NUCYNTA 100 MG TABS   . potassium chloride SA (K-DUR,KLOR-CON) 20 MEQ tablet TAKE 1 TABLET DAILY  . topiramate (TOPAMAX) 25 MG tablet   . triamterene-hydrochlorothiazide (MAXZIDE-25) 37.5-25 MG per tablet Take 1 each (1 tablet total) by mouth daily.  . [DISCONTINUED] ciprofloxacin (CIPRO) 500 MG tablet Take 1 tablet (500 mg total) by mouth 2 (two) times daily.       Review of Systems  Constitutional: Negative.   HENT: Negative.   Eyes: Negative.   Respiratory: Negative.   Cardiovascular: Negative.   Gastrointestinal: Negative.   Genitourinary: Negative.   Musculoskeletal: Negative.   Neurological: Negative.   Hematological: Negative.   Psychiatric/Behavioral: Negative.        Objective:   Physical Exam  Constitutional: She is oriented to person, place, and time. She appears well-developed and well-nourished.  HENT:  Head: Normocephalic.  Right Ear: Hearing, tympanic membrane, external ear and ear canal normal.  Left Ear: Hearing, tympanic membrane, external ear and ear canal normal.  Nose: Nose normal.  Mouth/Throat: Uvula is midline and oropharynx is clear and  moist.  Eyes: Conjunctivae and EOM are normal. Pupils are equal, round, and reactive to light.  Neck: Normal range of motion and full passive range of motion without pain. Neck supple. No JVD present. Carotid bruit is not present. No mass and no thyromegaly present.  Cardiovascular: Normal rate, normal heart sounds and intact distal pulses.   No murmur heard. Pulmonary/Chest: Effort normal and breath sounds normal. Right breast exhibits no inverted nipple, no mass, no nipple discharge, no skin change and no tenderness. Left breast exhibits no inverted nipple, no mass, no nipple discharge, no skin change and no tenderness.  Abdominal: Soft. Bowel sounds are normal. She exhibits no mass. There is no tenderness.  Genitourinary: Vagina normal and uterus normal. No breast swelling, tenderness, discharge or bleeding.  bimanual exam-No adnexal masses or tenderness. Vaginal cuff intact hemoccult negative  Musculoskeletal: Normal range of motion.  Lymphadenopathy:    She has no cervical adenopathy.  Neurological: She is alert and oriented to person, place, and time.  Skin: Skin is warm and dry.  Psychiatric: She has a normal mood and affect. Her behavior is normal. Judgment and thought content normal.   BP 128/69  Pulse 71  Temp(Src) 99.1 F (37.3 C) (Oral)  Ht 5' 8.4" (1.737 m)  Wt 180 lb (81.647 kg)  BMI 27.06 kg/m2       Assessment & Plan:   1. Encounter for routine gynecological examination   2. Essential hypertension, benign   3. Hyperlipidemia with target LDL less than 100   4.  Hypokalemia   5. Osteoporosis screening   6. Annual physical exam    Orders Placed This Encounter  Procedures  . DG Bone Density    Standing Status: Future     Number of Occurrences:      Standing Expiration Date: 06/08/2014    Order Specific Question:  Reason for Exam (SYMPTOM  OR DIAGNOSIS REQUIRED)    Answer:  screening    Order Specific Question:  Preferred imaging location?    Answer:  Internal    . CMP14+EGFR  . NMR, lipoprofile  . Thyroid Panel With TSH  . POCT UA - Microscopic Only  . POCT urinalysis dipstick  . POCT CBC   Meds ordered this encounter  Medications  . triamterene-hydrochlorothiazide (MAXZIDE-25) 37.5-25 MG per tablet    Sig: Take 1 tablet by mouth daily.    Dispense:  90 tablet    Refill:  1    Order Specific Question:  Supervising Provider    Answer:  Chipper Herb [[id number]]  . atenolol (TENORMIN) 50 MG tablet    Sig: Take 1 tablet (50 mg total) by mouth daily.    Dispense:  90 tablet    Refill:  1    Order Specific Question:  Supervising Provider    Answer:  Chipper Herb [[id number]]  . atorvastatin (LIPITOR) 40 MG tablet    Sig: Take 1 tablet (40 mg total) by mouth daily at 6 PM.    Dispense:  90 tablet    Refill:  1    Order Specific Question:  Supervising Provider    Answer:  Chipper Herb [[id number]]  . potassium chloride SA (K-DUR,KLOR-CON) 20 MEQ tablet    Sig: Take 1 tablet (20 mEq total) by mouth once.    Dispense:  90 tablet    Refill:  1    Order Specific Question:  Supervising Provider    Answer:  Chipper Herb [[id number]]  . doxycycline (VIBRA-TABS) 100 MG tablet    Sig: Take 1 tablet (100 mg total) by mouth 2 (two) times daily.    Dispense:  20 tablet    Refill:  0    Order Specific Question:  Supervising Provider    Answer:  Joycelyn Man   Will schedule dexa and mammogram Health maintenance reviewed Diet and exercise encouraged Continue all meds Follow up  In 6 months   [place], FNP

## 2013-04-09 LAB — NMR, LIPOPROFILE
Cholesterol: 162 mg/dL
HDL Cholesterol by NMR: 41 mg/dL
HDL Particle Number: 28.1 umol/L — ABNORMAL LOW
LDL Particle Number: 1344 nmol/L — ABNORMAL HIGH
LDL Size: 20.6 nm
LDLC SERPL CALC-MCNC: 85 mg/dL
LP-IR Score: 56 — ABNORMAL HIGH
Small LDL Particle Number: 658 nmol/L — ABNORMAL HIGH
Triglycerides by NMR: 180 mg/dL — ABNORMAL HIGH

## 2013-04-09 LAB — CMP14+EGFR
ALT: 23 IU/L (ref 0–32)
AST: 21 IU/L (ref 0–40)
Albumin/Globulin Ratio: 3.3 — ABNORMAL HIGH (ref 1.1–2.5)
Albumin: 4.6 g/dL (ref 3.6–4.8)
Alkaline Phosphatase: 66 IU/L (ref 39–117)
BUN/Creatinine Ratio: 19 (ref 11–26)
BUN: 15 mg/dL (ref 8–27)
CO2: 27 mmol/L (ref 18–29)
Calcium: 9.3 mg/dL (ref 8.6–10.2)
Chloride: 101 mmol/L (ref 97–108)
Creatinine, Ser: 0.8 mg/dL (ref 0.57–1.00)
GFR calc Af Amer: 89 mL/min/{1.73_m2} (ref >59)
GFR calc non Af Amer: 77 mL/min/{1.73_m2} (ref >59)
Globulin, Total: 1.4 g/dL — ABNORMAL LOW (ref 1.5–4.5)
Glucose: 99 mg/dL (ref 65–99)
Potassium: 3.8 mmol/L (ref 3.5–5.2)
Sodium: 143 mmol/L (ref 134–144)
Total Bilirubin: 0.9 mg/dL (ref 0.0–1.2)
Total Protein: 6 g/dL (ref 6.0–8.5)

## 2013-04-09 LAB — PAP IG (IMAGE GUIDED): PAP Smear Comment: 0

## 2013-04-09 LAB — THYROID PANEL WITH TSH
Free Thyroxine Index: 1.3 (ref 1.2–4.9)
T3 Uptake Ratio: 26 % (ref 24–39)
T4, Total: 5.1 ug/dL (ref 4.5–12.0)
TSH: 1.73 u[IU]/mL (ref 0.450–4.500)

## 2013-04-11 ENCOUNTER — Encounter: Payer: Self-pay | Admitting: Nurse Practitioner

## 2013-04-11 ENCOUNTER — Ambulatory Visit (INDEPENDENT_AMBULATORY_CARE_PROVIDER_SITE_OTHER): Payer: Medicare Other | Admitting: Nurse Practitioner

## 2013-04-11 VITALS — BP 99/50 | HR 62 | Temp 99.3°F | Ht 68.4 in | Wt 183.0 lb

## 2013-04-11 DIAGNOSIS — L259 Unspecified contact dermatitis, unspecified cause: Secondary | ICD-10-CM | POA: Diagnosis not present

## 2013-04-11 MED ORDER — METHYLPREDNISOLONE ACETATE 80 MG/ML IJ SUSP
80.0000 mg | Freq: Once | INTRAMUSCULAR | Status: AC
  Administered 2013-04-11: 80 mg via INTRAMUSCULAR

## 2013-04-11 MED ORDER — PREDNISONE 20 MG PO TABS
ORAL_TABLET | ORAL | Status: DC
Start: 2013-04-11 — End: 2013-10-07

## 2013-04-11 NOTE — Progress Notes (Signed)
   Subjective:    Patient ID: Brittany Mercer, female    DOB: Jan 09, 1946, 68 y.o.   MRN: [id number]  HPI Patient in today for follow up of rash on face- was given doxycycline which has not helped- She say sthat the rash stings and she now has a small spot on right hand that looks similar.    Review of Systems  Respiratory: Negative.   Cardiovascular: Negative.   Gastrointestinal: Negative.   All other systems reviewed and are negative.       Objective:   Physical Exam  Constitutional: She appears well-developed and well-nourished.  Cardiovascular: Normal rate, regular rhythm and normal heart sounds.   Pulmonary/Chest: Effort normal and breath sounds normal.  Skin:  erymethous maculo papular lesions to bil cheeks and chin and a small similar patchy area to right hand.    BP 99/50  Pulse 62  Temp(Src) 99.3 F (37.4 C) (Oral)  Ht 5' 8.4" (1.737 m)  Wt 183 lb (83.008 kg)  BMI 27.51 kg/m2       Assessment & Plan:   1. Contact dermatitis    Meds ordered this encounter  Medications  . methylPREDNISolone acetate (DEPO-MEDROL) injection 80 mg    Sig:   . predniSONE (DELTASONE) 20 MG tablet    Sig: 2 po at sametime daily X5 days- start on saturday    Dispense:  10 tablet    Refill:  0    Order Specific Question:  Supervising Provider    Answer:  Chipper Herb [[id number]]   Avoid picking or scratching Cool compresses if needed Call on Monday and let me know if improved- if no better will do derm referral  Mary-Margaret Hassell Done, FNP

## 2013-04-11 NOTE — Patient Instructions (Signed)

## 2013-04-23 DIAGNOSIS — G609 Hereditary and idiopathic neuropathy, unspecified: Secondary | ICD-10-CM | POA: Diagnosis not present

## 2013-04-23 DIAGNOSIS — Z79899 Other long term (current) drug therapy: Secondary | ICD-10-CM | POA: Diagnosis not present

## 2013-04-23 DIAGNOSIS — M1990 Unspecified osteoarthritis, unspecified site: Secondary | ICD-10-CM | POA: Diagnosis not present

## 2013-06-02 DIAGNOSIS — L821 Other seborrheic keratosis: Secondary | ICD-10-CM | POA: Diagnosis not present

## 2013-06-02 DIAGNOSIS — L820 Inflamed seborrheic keratosis: Secondary | ICD-10-CM | POA: Diagnosis not present

## 2013-06-02 DIAGNOSIS — D485 Neoplasm of uncertain behavior of skin: Secondary | ICD-10-CM | POA: Diagnosis not present

## 2013-06-02 DIAGNOSIS — L570 Actinic keratosis: Secondary | ICD-10-CM | POA: Diagnosis not present

## 2013-06-19 DIAGNOSIS — C44721 Squamous cell carcinoma of skin of unspecified lower limb, including hip: Secondary | ICD-10-CM | POA: Diagnosis not present

## 2013-06-25 ENCOUNTER — Encounter (HOSPITAL_COMMUNITY): Payer: Self-pay | Admitting: Emergency Medicine

## 2013-06-25 ENCOUNTER — Emergency Department (HOSPITAL_COMMUNITY): Payer: Medicare Other

## 2013-06-25 ENCOUNTER — Emergency Department (HOSPITAL_COMMUNITY)
Admission: EM | Admit: 2013-06-25 | Discharge: 2013-06-25 | Disposition: A | Discharge status: Home / Self Care | Payer: Medicare Other | Attending: Emergency Medicine | Admitting: Emergency Medicine

## 2013-06-25 ENCOUNTER — Telehealth: Payer: Self-pay | Admitting: Nurse Practitioner

## 2013-06-25 DIAGNOSIS — Z8719 Personal history of other diseases of the digestive system: Secondary | ICD-10-CM | POA: Insufficient documentation

## 2013-06-25 DIAGNOSIS — Z79899 Other long term (current) drug therapy: Secondary | ICD-10-CM | POA: Diagnosis not present

## 2013-06-25 DIAGNOSIS — IMO0002 Reserved for concepts with insufficient information to code with codable children: Secondary | ICD-10-CM | POA: Insufficient documentation

## 2013-06-25 DIAGNOSIS — G609 Hereditary and idiopathic neuropathy, unspecified: Secondary | ICD-10-CM | POA: Insufficient documentation

## 2013-06-25 DIAGNOSIS — Y929 Unspecified place or not applicable: Secondary | ICD-10-CM | POA: Insufficient documentation

## 2013-06-25 DIAGNOSIS — S1093XA Contusion of unspecified part of neck, initial encounter: Secondary | ICD-10-CM | POA: Diagnosis not present

## 2013-06-25 DIAGNOSIS — E785 Hyperlipidemia, unspecified: Secondary | ICD-10-CM | POA: Insufficient documentation

## 2013-06-25 DIAGNOSIS — S0990XA Unspecified injury of head, initial encounter: Secondary | ICD-10-CM | POA: Diagnosis not present

## 2013-06-25 DIAGNOSIS — Z792 Long term (current) use of antibiotics: Secondary | ICD-10-CM | POA: Diagnosis not present

## 2013-06-25 DIAGNOSIS — S0003XA Contusion of scalp, initial encounter: Secondary | ICD-10-CM | POA: Diagnosis not present

## 2013-06-25 DIAGNOSIS — I10 Essential (primary) hypertension: Secondary | ICD-10-CM | POA: Insufficient documentation

## 2013-06-25 DIAGNOSIS — S0083XA Contusion of other part of head, initial encounter: Principal | ICD-10-CM

## 2013-06-25 DIAGNOSIS — S0590XA Unspecified injury of unspecified eye and orbit, initial encounter: Secondary | ICD-10-CM | POA: Diagnosis not present

## 2013-06-25 DIAGNOSIS — W06XXXA Fall from bed, initial encounter: Secondary | ICD-10-CM | POA: Insufficient documentation

## 2013-06-25 DIAGNOSIS — Z880 Allergy status to penicillin: Secondary | ICD-10-CM | POA: Insufficient documentation

## 2013-06-25 DIAGNOSIS — W1809XA Striking against other object with subsequent fall, initial encounter: Secondary | ICD-10-CM | POA: Insufficient documentation

## 2013-06-25 DIAGNOSIS — Z87891 Personal history of nicotine dependence: Secondary | ICD-10-CM | POA: Insufficient documentation

## 2013-06-25 DIAGNOSIS — Y9389 Activity, other specified: Secondary | ICD-10-CM | POA: Insufficient documentation

## 2013-06-25 NOTE — ED Provider Notes (Signed)
CSN: [id number]     Arrival date & time 06/25/13  1103 History  This chart was scribed for Sharyon Cable, MD by Terressa Koyanagi, ED Scribe. This patient was seen in room APA14/AP[facility]14 and the patient's care was started at 11:43 AM.  PCP: Redge Gainer, MD   Chief Complaint  Patient presents with  . Fall   Patient is a 68 y.o. female presenting with fall. The history is provided by the patient. No language interpreter was used.  Fall  HPI Comments: Brittany Mercer is a 68 y.o. female , with a history of HTN, HLD, and peripheral neuropathy who presents to the Emergency Department complaining of a fall onset this morning around 4:30AM when she was getting out of bed. Pt complains of associated abrasion to the right eye; soreness of, and stinging sensation on, her scalp. Pt reports that presently the stinging and soreness in her head have subsided. Pt further reports that this is her 3rd fall in the last 4 months. Pt denies she lost consciousness when she fell today. Pt further denies neck pain; chest pain; vision changes; vomiting, diarrhea; injury to her mouth or teeth; or pain in the extremities. Pt denies being on any blood thinners.   Pt is a former smoker with a quit date of 07/18/99.  Past Medical History  Diagnosis Date  . Hypertension   . Hyperlipidemia   . Diverticulitis   . Peripheral neuropathy    Past Surgical History  Procedure Laterality Date  . Cesarean section    . Tonsillectomy    . Knee surgery      right  . Shoulder surgery     Family History  Problem Relation Age of Onset  . Hypertension Mother   . Hyperlipidemia Mother   . Stroke Mother   . Hypertension Father   . Hypertension Brother    History  Substance Use Topics  . Smoking status: Former Smoker    Quit date: 07/18/1999  . Smokeless tobacco: Not on file  . Alcohol Use: Yes   OB History   Grav Para Term Preterm Abortions TAB SAB Ect Mult Living                 Review of Systems  HENT:   Soreness and stinging of the head.   Eyes: Negative for visual disturbance.  Gastrointestinal: Negative for vomiting and diarrhea.  Musculoskeletal: Negative for myalgias.  Skin: Positive for wound (abrasion right eye ).  Neurological: Negative for syncope.  Hematological: Does not bruise/bleed easily.  All other systems reviewed and are negative.   Allergies  Penicillins and Sulfa antibiotics  Home Medications   Current Outpatient Rx  Name  Route  Sig  Dispense  Refill  . atenolol (TENORMIN) 50 MG tablet   Oral   Take 1 tablet (50 mg total) by mouth daily.   90 tablet   1   . atorvastatin (LIPITOR) 40 MG tablet   Oral   Take 1 tablet (40 mg total) by mouth daily at 6 PM.   90 tablet   1   . doxycycline (VIBRA-TABS) 100 MG tablet   Oral   Take 1 tablet (100 mg total) by mouth 2 (two) times daily.   20 tablet   0   . NUCYNTA 100 MG TABS               . potassium chloride SA (K-DUR,KLOR-CON) 20 MEQ tablet   Oral   Take 1 tablet (20 mEq total) by  mouth once.   90 tablet   1   . predniSONE (DELTASONE) 20 MG tablet      2 po at sametime daily X5 days- start on saturday   10 tablet   0   . topiramate (TOPAMAX) 25 MG tablet               . triamterene-hydrochlorothiazide (MAXZIDE-25) 37.5-25 MG per tablet   Oral   Take 1 tablet by mouth daily.   90 tablet   1    Triage Vitals: BP 163/76  Pulse 91  Temp(Src) 98.3 F (36.8 C) (Oral)  Resp 17  Ht 5' 8.75" (1.746 m)  Wt 175 lb (79.379 kg)  BMI 26.04 kg/m2  SpO2 100% Physical Exam  Nursing note and vitals reviewed.  CONSTITUTIONAL: Well developed/well nourished HEAD: forehead hematoma noted no other signs of trauma EYES: EOMI/PERRL, no signs of globe/orbtial injury, no conjunctival erythema ENMT: Mucous membranes moist, No evidence of facial/nasal trauma, no septal hematoma NECK: supple no meningeal signs SPINE:entire spine nontender CV: S1/S2 noted, no murmurs/rubs/gallops noted LUNGS: Lungs  are clear to auscultation bilaterally, no apparent distress ABDOMEN: soft, nontender, no rebound or guarding GU:no cva tenderness NEURO: Pt is awake/alert, moves all extremitiesx4, no focal weakness noted, no ataxia, GCS 15 EXTREMITIES: pulses normal, full ROM SKIN: warm, color normal PSYCH: no abnormalities of mood noted  ED Course  Procedures  DIAGNOSTIC STUDIES: Oxygen Saturation is 100% on RA, normal by my interpretation.    COORDINATION OF CARE:  11:54 AM-Discussed treatment plan which includes CT, discussing blood work results from January, and EKG, with pt at bedside and pt agreed to plan. Pt offered meds for pain, but pt declined pain meds.   Pt feels well and stable for d/c home.  We elected not to perform labs as she has had this in recent past and will f/u with her PCP  Imaging Review Ct Head Wo Contrast  06/25/2013   CLINICAL DATA:  Pain post trauma  EXAM: CT HEAD WITHOUT CONTRAST  TECHNIQUE: Contiguous axial images were obtained from the base of the skull through the vertex without intravenous contrast.  COMPARISON:  None.  FINDINGS: The ventricles are normal in size and configuration. There is no mass, hemorrhage, extra-axial fluid collection, or midline shift. Gray-white compartments appear normal. There is no demonstrable acute infarct.  Minimal basal ganglia calcification is felt to be physiologic. Bony calvarium appears intact. The mastoid air cells are clear. There is a right frontal scalp hematoma. There is minimal mucosal thickening in the left maxillary antrum. There is patchy sinus disease in the left sphenoid sinus.  IMPRESSION: Right frontal scalp hematoma. Areas of paranasal sinus disease. No intracranial mass, hemorrhage, or extra-axial fluid. No evidence suggesting acute infarct.   Electronically Signed   By: Lowella Grip M.D.   On: 06/25/2013 12:30     EKG Interpretation   Date/Time:  Wednesday June 25 2013 12:27:57 EDT Ventricular Rate:  66 PR Interval:   174 QRS Duration: 86 QT Interval:  422 QTC Calculation: 442 R Axis:   -25 Text Interpretation:  Normal sinus rhythm Normal ECG No previous ECGs  available Confirmed by Christy Gentles  MD, Elenore Rota ([id number]) on 06/25/2013 12:35:57  PM      MDM   Final diagnoses:  Minor head injury    Nursing notes including past medical history and social history reviewed and considered in documentation   I personally performed the services described in this documentation, which was scribed in my presence.  The recorded information has been reviewed and is accurate.    Sharyon Cable, MD 06/25/13 [phone]

## 2013-06-25 NOTE — ED Notes (Signed)
Patient denies any dizziness upon sitting and standing.  

## 2013-06-25 NOTE — ED Notes (Signed)
Pt states hx of peripheral neuropathy. States she has had 3 falls since November. Abrasion to right eye lid. Pt states soreness from fall and stinging to her head from hitting the floor. Denies LOC with today's fall.

## 2013-06-25 NOTE — ED Notes (Signed)
nad noted prior to dc. Dc instructions reviewed and explained. Voiced understanding.  

## 2013-06-25 NOTE — Telephone Encounter (Signed)
Fallen 3 x in last few months Hit head on wall today Told to go to a [facility] ER and be eval and have CT of head

## 2013-06-25 NOTE — Discharge Instructions (Signed)

## 2013-08-29 DIAGNOSIS — Z1231 Encounter for screening mammogram for malignant neoplasm of breast: Secondary | ICD-10-CM | POA: Diagnosis not present

## 2013-09-03 DIAGNOSIS — G609 Hereditary and idiopathic neuropathy, unspecified: Secondary | ICD-10-CM | POA: Diagnosis not present

## 2013-09-03 DIAGNOSIS — M1990 Unspecified osteoarthritis, unspecified site: Secondary | ICD-10-CM | POA: Diagnosis not present

## 2013-09-03 DIAGNOSIS — Z79899 Other long term (current) drug therapy: Secondary | ICD-10-CM | POA: Diagnosis not present

## 2013-09-03 DIAGNOSIS — M47817 Spondylosis without myelopathy or radiculopathy, lumbosacral region: Secondary | ICD-10-CM | POA: Diagnosis not present

## 2013-09-10 DIAGNOSIS — M4120 Other idiopathic scoliosis, site unspecified: Secondary | ICD-10-CM | POA: Diagnosis not present

## 2013-09-10 DIAGNOSIS — M47817 Spondylosis without myelopathy or radiculopathy, lumbosacral region: Secondary | ICD-10-CM | POA: Diagnosis not present

## 2013-09-10 DIAGNOSIS — M5137 Other intervertebral disc degeneration, lumbosacral region: Secondary | ICD-10-CM | POA: Diagnosis not present

## 2013-09-10 DIAGNOSIS — D1779 Benign lipomatous neoplasm of other sites: Secondary | ICD-10-CM | POA: Diagnosis not present

## 2013-09-24 DIAGNOSIS — M1990 Unspecified osteoarthritis, unspecified site: Secondary | ICD-10-CM | POA: Diagnosis not present

## 2013-09-24 DIAGNOSIS — M47814 Spondylosis without myelopathy or radiculopathy, thoracic region: Secondary | ICD-10-CM | POA: Diagnosis not present

## 2013-09-24 DIAGNOSIS — M47812 Spondylosis without myelopathy or radiculopathy, cervical region: Secondary | ICD-10-CM | POA: Diagnosis not present

## 2013-09-24 DIAGNOSIS — G609 Hereditary and idiopathic neuropathy, unspecified: Secondary | ICD-10-CM | POA: Diagnosis not present

## 2013-10-02 DIAGNOSIS — M5030 Other cervical disc degeneration, unspecified cervical region: Secondary | ICD-10-CM | POA: Diagnosis not present

## 2013-10-02 DIAGNOSIS — M47812 Spondylosis without myelopathy or radiculopathy, cervical region: Secondary | ICD-10-CM | POA: Diagnosis not present

## 2013-10-02 DIAGNOSIS — M47814 Spondylosis without myelopathy or radiculopathy, thoracic region: Secondary | ICD-10-CM | POA: Diagnosis not present

## 2013-10-02 DIAGNOSIS — M5124 Other intervertebral disc displacement, thoracic region: Secondary | ICD-10-CM | POA: Diagnosis not present

## 2013-10-02 DIAGNOSIS — M5020 Other cervical disc displacement, unspecified cervical region: Secondary | ICD-10-CM | POA: Diagnosis not present

## 2013-10-06 DIAGNOSIS — Z79899 Other long term (current) drug therapy: Secondary | ICD-10-CM | POA: Diagnosis not present

## 2013-10-06 DIAGNOSIS — M47817 Spondylosis without myelopathy or radiculopathy, lumbosacral region: Secondary | ICD-10-CM | POA: Diagnosis not present

## 2013-10-06 DIAGNOSIS — M1990 Unspecified osteoarthritis, unspecified site: Secondary | ICD-10-CM | POA: Diagnosis not present

## 2013-10-06 DIAGNOSIS — G609 Hereditary and idiopathic neuropathy, unspecified: Secondary | ICD-10-CM | POA: Diagnosis not present

## 2013-10-07 ENCOUNTER — Encounter: Payer: Self-pay | Admitting: Nurse Practitioner

## 2013-10-07 ENCOUNTER — Ambulatory Visit (INDEPENDENT_AMBULATORY_CARE_PROVIDER_SITE_OTHER): Payer: Medicare Other | Admitting: Nurse Practitioner

## 2013-10-07 VITALS — BP 122/68 | HR 78 | Temp 98.9°F | Ht 68.75 in | Wt 184.4 lb

## 2013-10-07 DIAGNOSIS — G609 Hereditary and idiopathic neuropathy, unspecified: Secondary | ICD-10-CM | POA: Diagnosis not present

## 2013-10-07 DIAGNOSIS — I10 Essential (primary) hypertension: Secondary | ICD-10-CM

## 2013-10-07 DIAGNOSIS — L02232 Carbuncle of back [any part, except buttock]: Secondary | ICD-10-CM

## 2013-10-07 DIAGNOSIS — E876 Hypokalemia: Secondary | ICD-10-CM | POA: Diagnosis not present

## 2013-10-07 DIAGNOSIS — L02229 Furuncle of trunk, unspecified: Secondary | ICD-10-CM

## 2013-10-07 DIAGNOSIS — E785 Hyperlipidemia, unspecified: Secondary | ICD-10-CM

## 2013-10-07 DIAGNOSIS — L02239 Carbuncle of trunk, unspecified: Secondary | ICD-10-CM

## 2013-10-07 DIAGNOSIS — Z13820 Encounter for screening for osteoporosis: Secondary | ICD-10-CM

## 2013-10-07 MED ORDER — TRIAMTERENE-HCTZ 37.5-25 MG PO TABS: 1.0000 | ORAL_TABLET | Freq: Every day | ORAL | Status: DC

## 2013-10-07 MED ORDER — POTASSIUM CHLORIDE CRYS ER 20 MEQ PO TBCR: 20.0000 meq | EXTENDED_RELEASE_TABLET | Freq: Once | ORAL | Status: DC

## 2013-10-07 MED ORDER — CEPHALEXIN 500 MG PO CAPS
500.0000 mg | ORAL_CAPSULE | Freq: Four times a day (QID) | ORAL | Status: DC
Start: 2013-10-07 — End: 2013-10-27

## 2013-10-07 MED ORDER — ATORVASTATIN CALCIUM 40 MG PO TABS: 40.0000 mg | ORAL_TABLET | Freq: Every day | ORAL | Status: DC

## 2013-10-07 MED ORDER — ATENOLOL 50 MG PO TABS: 50.0000 mg | ORAL_TABLET | Freq: Every day | ORAL | Status: DC

## 2013-10-07 NOTE — Progress Notes (Signed)
Subjective:    Patient ID: Brittany Mercer, female    DOB: Jan 21, 1946, 68 y.o.   MRN: [id number]  Patient here today for follow up of chronic medical problems. SHe is doing well. SHe complains of saddness following the death of her husband, but does nit feel like she needs any medication for this- She does say that she has been falling a lot- Dr. Francesco Runner is sending her to neurologist for further work up- He has already done MRI of back and head which were negative. She is going to have nerve conduction studies next.   Hypertension This is a chronic problem. The current episode started more than 1 year ago. The problem is unchanged. The problem is controlled. Pertinent negatives include no blurred vision, chest pain, headaches, neck pain, palpitations, peripheral edema or shortness of breath. There are no associated agents to hypertension. Risk factors for coronary artery disease include dyslipidemia, post-menopausal state and stress. Past treatments include beta blockers and diuretics. The current treatment provides significant improvement. There are no compliance problems.   Hyperlipidemia This is a chronic problem. The current episode started more than 1 year ago. The problem is controlled. Recent lipid tests were reviewed and are normal. There are no known factors aggravating her hyperlipidemia. Pertinent negatives include no chest pain, focal weakness, leg pain, myalgias or shortness of breath. The current treatment provides significant improvement of lipids. There are no compliance problems.  Risk factors for coronary artery disease include hypertension and post-menopausal.  Hypokalemia Patient takes K+ daily. No c/o cramps in lower extremities Peripheral Neuropathy Nucyenta and oxycodone- Doing well- Patient in fight with ins to get nucyenta approved. Nucynta works better than oxycodone without the side effects. Dr. Francesco Runner.  * has a lesion on her back that has gottenb bigger that she wants [place]  to look at.   Review of Systems  Eyes: Negative for blurred vision.  Respiratory: Negative for shortness of breath.   Cardiovascular: Negative for chest pain and palpitations.  Musculoskeletal: Negative for myalgias and neck pain.  Neurological: Negative for focal weakness and headaches.  All other systems reviewed and are negative.      Objective:   Physical Exam  Constitutional: She is oriented to person, place, and time. She appears well-developed and well-nourished.  HENT:  Nose: Nose normal.  Mouth/Throat: Oropharynx is clear and moist.  Eyes: EOM are normal.  Neck: Trachea normal, normal range of motion and full passive range of motion without pain. Neck supple. No JVD present. Carotid bruit is not present. No thyromegaly present.  Cardiovascular: Normal rate, regular rhythm, normal heart sounds and intact distal pulses.  Exam reveals no gallop and no friction rub.   No murmur heard. Pulmonary/Chest: Effort normal and breath sounds normal.  Abdominal: Soft. Bowel sounds are normal. She exhibits no distension and no mass. There is no tenderness.  Musculoskeletal: Normal range of motion.  Lymphadenopathy:    She has no cervical adenopathy.  Neurological: She is alert and oriented to person, place, and time. She has normal reflexes.  Cranial nerve testing deferred to neurologist.  Skin: Skin is warm and dry.  3cm indurated lesion mid upper back- no drainage  Psychiatric: She has a normal mood and affect. Her behavior is normal. Judgment and thought content normal.   BP 122/68  Pulse 78  Temp(Src) 98.9 F (37.2 C) (Oral)  Ht 5' 8.75" (1.746 m)  Wt 184 lb 6.4 oz (83.643 kg)  BMI 27.44 kg/m2  Assessment & Plan:   1. Unspecified hereditary and idiopathic peripheral neuropathy   2. Hypokalemia   3. Hyperlipidemia with target LDL less than 100   4. Essential hypertension, benign   5. Screening for osteoporosis   6. Carbuncle of back     Orders Placed This  Encounter  Procedures  . CMP14+EGFR  . NMR, lipoprofile   Meds ordered this encounter  Medications  . triamterene-hydrochlorothiazide (MAXZIDE-25) 37.5-25 MG per tablet    Sig: Take 1 tablet by mouth daily.    Dispense:  90 tablet    Refill:  1    Order Specific Question:  Supervising Provider    Answer:  Chipper Herb [[id number]]  . potassium chloride SA (K-DUR,KLOR-CON) 20 MEQ tablet    Sig: Take 1 tablet (20 mEq total) by mouth once.    Dispense:  90 tablet    Refill:  1    Order Specific Question:  Supervising Provider    Answer:  Chipper Herb [[id number]]  . atorvastatin (LIPITOR) 40 MG tablet    Sig: Take 1 tablet (40 mg total) by mouth daily at 6 PM.    Dispense:  90 tablet    Refill:  1    Order Specific Question:  Supervising Provider    Answer:  Chipper Herb [[id number]]  . atenolol (TENORMIN) 50 MG tablet    Sig: Take 1 tablet (50 mg total) by mouth daily.    Dispense:  90 tablet    Refill:  1    Order Specific Question:  Supervising Provider    Answer:  Chipper Herb [[id number]]  . cephALEXin (KEFLEX) 500 MG capsule    Sig: Take 1 capsule (500 mg total) by mouth 4 (four) times daily.    Dispense:  30 capsule    Refill:  0    Order Specific Question:  Supervising Provider    Answer:  Chipper Herb [place] pending Health maintenance reviewed Diet and exercise encouraged Continue all meds Follow up  In 3 months   [place], FNP

## 2013-10-07 NOTE — Patient Instructions (Signed)

## 2013-10-08 LAB — NMR, LIPOPROFILE
Cholesterol: 174 mg/dL (ref 100–199)
HDL Cholesterol by NMR: 36 mg/dL — ABNORMAL LOW (ref >39)
HDL Particle Number: 27.9 umol/L — ABNORMAL LOW (ref >=30.5)
LDL Particle Number: 1436 nmol/L — ABNORMAL HIGH (ref <1000)
LDL Size: 20.4 nm (ref >20.5)
LDLC SERPL CALC-MCNC: 100 mg/dL — ABNORMAL HIGH (ref 0–99)
LP-IR Score: 81 — ABNORMAL HIGH (ref <=45)
Small LDL Particle Number: 739 nmol/L — ABNORMAL HIGH (ref <=527)
Triglycerides by NMR: 190 mg/dL — ABNORMAL HIGH (ref 0–149)

## 2013-10-08 LAB — CMP14+EGFR
ALT: 19 IU/L (ref 0–32)
AST: 17 IU/L (ref 0–40)
Albumin/Globulin Ratio: 2.4 (ref 1.1–2.5)
Albumin: 4.5 g/dL (ref 3.6–4.8)
Alkaline Phosphatase: 74 IU/L (ref 39–117)
BUN/Creatinine Ratio: 17 (ref 11–26)
BUN: 15 mg/dL (ref 8–27)
CO2: 26 mmol/L (ref 18–29)
Calcium: 9.6 mg/dL (ref 8.7–10.3)
Chloride: 101 mmol/L (ref 97–108)
Creatinine, Ser: 0.89 mg/dL (ref 0.57–1.00)
GFR calc Af Amer: 78 mL/min/{1.73_m2} (ref >59)
GFR calc non Af Amer: 67 mL/min/{1.73_m2} (ref >59)
Globulin, Total: 1.9 g/dL (ref 1.5–4.5)
Glucose: 94 mg/dL (ref 65–99)
Potassium: 3.2 mmol/L — ABNORMAL LOW (ref 3.5–5.2)
Sodium: 144 mmol/L (ref 134–144)
Total Bilirubin: 0.9 mg/dL (ref 0.0–1.2)
Total Protein: 6.4 g/dL (ref 6.0–8.5)

## 2013-10-27 ENCOUNTER — Encounter (INDEPENDENT_AMBULATORY_CARE_PROVIDER_SITE_OTHER): Payer: Self-pay

## 2013-10-27 ENCOUNTER — Encounter: Payer: Self-pay | Admitting: Neurology

## 2013-10-27 ENCOUNTER — Ambulatory Visit (INDEPENDENT_AMBULATORY_CARE_PROVIDER_SITE_OTHER): Payer: Medicare Other | Admitting: Neurology

## 2013-10-27 ENCOUNTER — Telehealth: Payer: Self-pay | Admitting: Neurology

## 2013-10-27 VITALS — BP 161/91 | HR 82 | Ht 69.0 in | Wt 181.0 lb

## 2013-10-27 DIAGNOSIS — G609 Hereditary and idiopathic neuropathy, unspecified: Secondary | ICD-10-CM

## 2013-10-27 DIAGNOSIS — G478 Other sleep disorders: Secondary | ICD-10-CM | POA: Diagnosis not present

## 2013-10-27 DIAGNOSIS — F513 Sleepwalking [somnambulism]: Secondary | ICD-10-CM | POA: Insufficient documentation

## 2013-10-27 DIAGNOSIS — Z0289 Encounter for other administrative examinations: Secondary | ICD-10-CM

## 2013-10-27 MED ORDER — DULOXETINE HCL 30 MG PO CPEP: ORAL_CAPSULE | ORAL | Status: DC

## 2013-10-27 NOTE — Progress Notes (Signed)
Reason for visit: Peripheral neuropathy  Brittany Mercer is a 68 y.o. female  History of present illness:  Brittany Mercer is a 68 year old right-handed white female with a history of a peripheral neuropathy that was originally diagnosed in 2008. She indicates that her last nerve conduction study was done in 2009, when she lived in [place], [place]. She does not have the report of the study with her. The patient was told that she had a significant peripheral neuropathy involving the arms and legs. She has had a lot of burning dysesthesias in the feet, and some occasional lancinating pains in the feet. She has been placed on Topamax, but she has had cognitive side effects on this medication, with word finding problems. The patient believes that the Topamax has helped the pain some, however. The patient denies any significant balance issues. She indicates that she has been having some emotional stress following the death of her mother, and then her husband. The patient began having problems with sleepwalking that developed in April 2015. The patient indicates that she remembers getting out of bed, but she starts getting somewhat drowsy but she may fall off to sleep before she can get back to bed. The patient is becoming more withdrawn, and wants to sleep throughout the day. The patient is concerned she may be getting depressed. She denies any neck or low back pain, and she has had recent studies of the cervical and thoracic spine. A CT scan of the head was done in April 2015. The patient was felt to have signs of hyperreflexia on a recent examination, and she is sent to this office for an evaluation. She denies issues controlling the bowels or the bladder. She denies any significant gait stability problems during the daytime, and she does not have to use a cane or a walker. Her father also had a peripheral neuropathy. The patient indicates that a prior blood work evaluation did not show the etiology  of her neuropathy problem.  Past Medical History  Diagnosis Date  . Hypertension   . Hyperlipidemia   . Diverticulitis   . Peripheral neuropathy   . 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 Cesarean section    . Tonsillectomy    . Knee surgery      right  . Shoulder surgery Left     Family History  Problem Relation Age of Onset  . Hypertension Mother   . Hyperlipidemia Mother   . Stroke Mother   . Hypertension Father   . Dementia Father   . Neuropathy Father   . Hypertension Brother     Social history:  reports that she quit smoking about 15 years ago. Her smoking use included Cigarettes. She has a 12.5 pack-year smoking history. She does not have any smokeless tobacco history on file. She reports that she drinks alcohol. She reports that she does not use illicit drugs.  Medications:  Current Outpatient Prescriptions on File Prior to Visit  Medication Sig Dispense Refill  . atenolol (TENORMIN) 50 MG tablet Take 1 tablet (50 mg total) by mouth daily.  90 tablet  1  . atorvastatin (LIPITOR) 40 MG tablet Take 1 tablet (40 mg total) by mouth daily at 6 PM.  90 tablet  1  . NUCYNTA 100 MG TABS       . potassium chloride SA (K-DUR,KLOR-CON) 20 MEQ tablet Take 1 tablet (20 mEq total) by mouth once.  90 tablet  1  .  triamterene-hydrochlorothiazide (MAXZIDE-25) 37.5-25 MG per tablet Take 1 tablet by mouth daily.  90 tablet  1   No current facility-administered medications on file prior to visit.      Allergies  Allergen Reactions  . Penicillins   . Sulfa Antibiotics     ROS:  Out of a complete 14 system review of symptoms, the patient complains only of the following symptoms, and all other reviewed systems are negative.  Numbness of the feet Sleepwalking  Blood pressure 161/91, pulse 82, height 5\' 9"  (1.753 m), weight 181 lb (82.101 kg).  Physical Exam  General: The patient is alert and cooperative at the time of the  examination.  Eyes: Pupils are equal, round, and reactive to light. Discs are flat bilaterally.  Neck: The neck is supple, no carotid bruits are noted.  Respiratory: The respiratory examination is clear.  Cardiovascular: The cardiovascular examination reveals a regular rate and rhythm, no obvious murmurs or rubs are noted.  Skin: Extremities are without significant edema.  Neurologic Exam  Mental status: The patient is alert and oriented x 3 at the time of the examination. The patient has apparent normal recent and remote memory, with an apparently normal attention span and concentration ability.  Cranial nerves: Facial symmetry is present. There is good sensation of the face to pinprick and soft touch bilaterally. The strength of the facial muscles and the muscles to head turning and shoulder shrug are normal bilaterally. Speech is well enunciated, no aphasia or dysarthria is noted. Extraocular movements are full. Visual fields are full. The tongue is midline, and the patient has symmetric elevation of the soft palate. No obvious hearing deficits are noted.  Motor: The motor testing reveals 5 over 5 strength of all 4 extremities. Good symmetric motor tone is noted throughout.  Sensory: Sensory testing is intact to pinprick, soft touch, vibration sensation, and position sense on all 4 extremities, with exception that there is a stocking pattern pinprick sensory deficit up to the knees bilaterally. No evidence of extinction is noted.  Coordination: Cerebellar testing reveals good finger-nose-finger and heel-to-shin bilaterally.  Gait and station: Gait is normal. Tandem gait is normal. Romberg is negative. No drift is seen.  Reflexes: Deep tendon reflexes are symmetric, but are slightly depressed bilaterally. Toes are downgoing bilaterally.   CT brain 06/25/13:  IMPRESSION:  Right frontal scalp hematoma. Areas of paranasal sinus disease. No  intracranial mass, hemorrhage, or extra-axial  fluid. No evidence  suggesting acute infarct.     Assessment/Plan:  1. Peripheral neuropathy  2. Somnambulism   3. Probable depression  The patient is having new issues with sleepwalking, and this may be a manifestation of underlying stress and depression. The patient indicates that she never had difficulty with sleepwalking as a child. She will be placed on Cymbalta for the peripheral neuropathy pain and for the depression, and she will be tapered off of the Topamax. The patient is having cognitive side effects on the Topamax. She will be set up for a nerve conduction study of both legs, and one arm. She will followup in about 4 months. If the sleepwalking continues, a sleep evaluation may be considered.  Jill Alexanders MD 10/27/2013 8:08 PM  [facility] [address] [place],  [address]  Phone [phone] Fax [phone]

## 2013-10-27 NOTE — Telephone Encounter (Signed)
I called patient. The nerve conduction studies do not show any abnormalities. The patient may have a small fiber neuropathy, but clearly does not have a demyelinating neuropathy or large fiber neuropathy.

## 2013-10-27 NOTE — Patient Instructions (Signed)

## 2013-10-27 NOTE — Procedures (Signed)
     HISTORY:  Brittany Mercer is a 68 year old patient with a history of a peripheral neuropathy diagnosed in 2008. She reports some burning dysesthesias in the feet, worse at night. She denies any balance issues. She is being reevaluated for the neuropathy issue.  NERVE CONDUCTION STUDIES:  Nerve conduction studies were performed on the right upper extremity. The distal motor latencies and motor amplitudes for the median and ulnar nerves were within normal limits. The F wave latencies and nerve conduction velocities for these nerves were also normal. The sensory latencies for the median and ulnar nerves were normal.  Nerve conduction studies were performed on both lower extremities. The distal motor latencies and motor amplitudes for the peroneal and posterior tibial nerves were within normal limits. The nerve conduction velocities for these nerves were also normal. The H reflex latencies were normal. The sensory latencies for the peroneal nerves were within normal limits.   EMG STUDIES:  EMG evaluation was not performed.  IMPRESSION:  Nerve conduction studies done on the right upper extremity and both lower extremities were within normal limits. No evidence of a peripheral neuropathy is seen. A small fiber neuropathy may be missed by a standard nerve conduction studies, however. Clinical correlation is required.  Jill Alexanders MD 10/27/2013 1:02 PM  [facility] [address] [place], [place] [address]  Phone [phone] Fax [phone]

## 2013-11-28 DIAGNOSIS — M47817 Spondylosis without myelopathy or radiculopathy, lumbosacral region: Secondary | ICD-10-CM | POA: Diagnosis not present

## 2013-11-28 DIAGNOSIS — Z79899 Other long term (current) drug therapy: Secondary | ICD-10-CM | POA: Diagnosis not present

## 2013-11-28 DIAGNOSIS — M1990 Unspecified osteoarthritis, unspecified site: Secondary | ICD-10-CM | POA: Diagnosis not present

## 2013-11-28 DIAGNOSIS — G609 Hereditary and idiopathic neuropathy, unspecified: Secondary | ICD-10-CM | POA: Diagnosis not present

## 2013-12-08 DIAGNOSIS — L570 Actinic keratosis: Secondary | ICD-10-CM | POA: Diagnosis not present

## 2013-12-08 DIAGNOSIS — Z85828 Personal history of other malignant neoplasm of skin: Secondary | ICD-10-CM | POA: Diagnosis not present

## 2013-12-18 DIAGNOSIS — F329 Major depressive disorder, single episode, unspecified: Secondary | ICD-10-CM | POA: Diagnosis not present

## 2013-12-31 ENCOUNTER — Ambulatory Visit (INDEPENDENT_AMBULATORY_CARE_PROVIDER_SITE_OTHER): Payer: Medicare Other | Admitting: Pharmacist

## 2013-12-31 ENCOUNTER — Encounter: Payer: Self-pay | Admitting: Pharmacist

## 2013-12-31 ENCOUNTER — Ambulatory Visit (INDEPENDENT_AMBULATORY_CARE_PROVIDER_SITE_OTHER): Payer: Medicare Other

## 2013-12-31 VITALS — Ht 69.0 in | Wt 180.0 lb

## 2013-12-31 DIAGNOSIS — Z13820 Encounter for screening for osteoporosis: Secondary | ICD-10-CM

## 2013-12-31 DIAGNOSIS — E876 Hypokalemia: Secondary | ICD-10-CM | POA: Diagnosis not present

## 2013-12-31 DIAGNOSIS — E559 Vitamin D deficiency, unspecified: Secondary | ICD-10-CM | POA: Diagnosis not present

## 2013-12-31 DIAGNOSIS — Z79899 Other long term (current) drug therapy: Secondary | ICD-10-CM | POA: Diagnosis not present

## 2013-12-31 DIAGNOSIS — Z23 Encounter for immunization: Secondary | ICD-10-CM

## 2013-12-31 MED ORDER — POTASSIUM CHLORIDE ER 10 MEQ PO TBCR: 20.0000 meq | EXTENDED_RELEASE_TABLET | Freq: Every day | ORAL | Status: DC

## 2013-12-31 NOTE — Progress Notes (Signed)
Patient ID: Zoella Roberti, female   DOB: May 15, 1945, 68 y.o.   MRN: [id number] [facility] Current Height: Height: '5\' 9"'  (175.3 cm)      Max Lifetime Height:  '5\' 9"'  Current Weight: Weight: 180 lb (81.647 kg)       Ethnicity:Caucasian    HPI: Does pt already have a diagnosis of:  Osteopenia?  No Osteoporosis?  No  Back Pain?  No       Kyphosis?  No Prior fracture?  No Med(s) for Osteoporosis/Osteopenia:  none Med(s) previously tried for Osteoporosis/Osteopenia:  None  In reviewing patient's medication list she states that she is on taking potassium regularly due to difficulty swallowing KCL 67mq tablets.  Per patient she was instructed to take 1.5 tablets daily and she is usually only taking 0.5 to 1 tablet daily.  Last Potassium level was 3.2 (09/2013).  She has increase OJ and banana intake.                                                             PMH: Age at menopause:  Surgical - 68yo Hysterectomy?  Yes Oophorectomy?  Yes HRT? Yes - Former.  Type/duration: estrace - for about 12 years Steroid Use?  No Thyroid med?  No History of cancer?  No History of digestive disorders (ie Crohn's)?  No Current or previous eating disorders?  No Last Vitamin D Result:  Not on record Last GFR Result:  37 (10/07/2013)   FH/SH: Family history of osteoporosis?  No Parent with history of hip fracture?  No Family history of breast cancer?  No Exercise?  A little Smoking?  No Alcohol?  No    Calcium Assessment Calcium Intake  # of servings/day  Calcium mg  Milk (8 oz) 2  x  300  = 6018m Yogurt (4 oz) 1 x  200 = 20025mCheese (1 oz) 0 x  200 = 0  Other Calcium sources   250m54ma supplement 0 = 0   Estimated calcium intake per day 1050mg33mDEXA Results Date of Test T-Score for AP Spine L1-L4 T-Score for Total Left Hip T-Score for Total Right Hip  12/31/2013 5.5 1.8 1.3  10/25/2011 5.2 2.0 1.6             Assessment: Normal BMD Medication management  problem Hypokalemia  Recommendations: 1.  Change to smaller potassium tablet - 10mEq45me 2 to 3 tablets daily 2.   Orders Placed This Encounter  Procedures  . BMP8+EGFR  . Vit D  25 hydroxy (rtn osteoporosis monitoring)    3.  recommend calcium 1200mg d44m through supplementation or diet.  4.  recommend weight bearing exercise - 30 minutes at least 4 days per week.   4.  Counseled and educated about fall risk and prevention.  Recheck DEXA:  3 years  Time spent counseling patient:  30 minutes  Jaimon Bugaj ECherre RobinsD, CPP

## 2013-12-31 NOTE — Patient Instructions (Signed)

## 2014-01-01 ENCOUNTER — Telehealth: Payer: Self-pay | Admitting: Pharmacist

## 2014-01-01 DIAGNOSIS — E876 Hypokalemia: Secondary | ICD-10-CM

## 2014-01-01 DIAGNOSIS — F328 Other depressive episodes: Secondary | ICD-10-CM | POA: Diagnosis not present

## 2014-01-01 LAB — BMP8+EGFR
BUN/Creatinine Ratio: 12 (ref 11–26)
BUN: 10 mg/dL (ref 8–27)
CO2: 29 mmol/L (ref 18–29)
Calcium: 9.1 mg/dL (ref 8.7–10.3)
Chloride: 100 mmol/L (ref 97–108)
Creatinine, Ser: 0.81 mg/dL (ref 0.57–1.00)
GFR calc Af Amer: 87 mL/min/{1.73_m2} (ref >59)
GFR calc non Af Amer: 75 mL/min/{1.73_m2} (ref >59)
Glucose: 83 mg/dL (ref 65–99)
Potassium: 3.2 mmol/L — ABNORMAL LOW (ref 3.5–5.2)
Sodium: 143 mmol/L (ref 134–144)

## 2014-01-01 LAB — VITAMIN D 25 HYDROXY (VIT D DEFICIENCY, FRACTURES): Vit D, 25-Hydroxy: 17.7 ng/mL — ABNORMAL LOW (ref 30.0–100.0)

## 2014-01-01 MED ORDER — VITAMIN D (ERGOCALCIFEROL) 1.25 MG (50000 UNIT) PO CAPS: 50000.0000 [IU] | ORAL_CAPSULE | ORAL | Status: DC

## 2014-01-01 NOTE — Telephone Encounter (Signed)
Vitamin D low - start Vitamin D 50, 000IU weekly for 12 weeks, then recheck vitamin D levels.  Potassium low - likely due to non compliance and patient's difficulty swallowing potassium supplement. Issue was discussed at appt. Take potassium 61mEq 3 daily - recheck BMET in 2-4 weeks.

## 2014-01-02 NOTE — Telephone Encounter (Signed)
Patient returned called and labs results and recommendations given .  RTC for BMET in 2-4 weeks. - appt for lab given.

## 2014-01-22 DIAGNOSIS — F328 Other depressive episodes: Secondary | ICD-10-CM | POA: Diagnosis not present

## 2014-01-23 ENCOUNTER — Other Ambulatory Visit (INDEPENDENT_AMBULATORY_CARE_PROVIDER_SITE_OTHER): Payer: Medicare Other

## 2014-01-23 DIAGNOSIS — E876 Hypokalemia: Secondary | ICD-10-CM | POA: Diagnosis not present

## 2014-01-24 LAB — BMP8+EGFR
BUN/Creatinine Ratio: 14 (ref 11–26)
BUN: 12 mg/dL (ref 8–27)
CO2: 24 mmol/L (ref 18–29)
Calcium: 9.8 mg/dL (ref 8.7–10.3)
Chloride: 98 mmol/L (ref 97–108)
Creatinine, Ser: 0.83 mg/dL (ref 0.57–1.00)
GFR calc Af Amer: 84 mL/min/{1.73_m2} (ref >59)
GFR calc non Af Amer: 73 mL/min/{1.73_m2} (ref >59)
Glucose: 105 mg/dL — ABNORMAL HIGH (ref 65–99)
Potassium: 3.2 mmol/L — ABNORMAL LOW (ref 3.5–5.2)
Sodium: 141 mmol/L (ref 134–144)

## 2014-01-26 ENCOUNTER — Telehealth: Payer: Self-pay | Admitting: Pharmacist

## 2014-01-26 NOTE — Telephone Encounter (Signed)
Reviewed BMP from 01/23/2014 - potassium still low - verify compliance and consider increase potassium supplementation.  Tried to call patient - no answer.

## 2014-01-26 NOTE — Telephone Encounter (Signed)
Patient reports that she was feeling nauseated due to cymbalta.  She was only taking KCL 85mEq 1 tablet daily.  She has now stopped cymbalta and feeling better.   She will restart KCl 10 mEq 3 tablets daily.  Recheck BMET in 2-4 weeks.

## 2014-01-27 DIAGNOSIS — G609 Hereditary and idiopathic neuropathy, unspecified: Secondary | ICD-10-CM | POA: Diagnosis not present

## 2014-01-27 DIAGNOSIS — M1990 Unspecified osteoarthritis, unspecified site: Secondary | ICD-10-CM | POA: Diagnosis not present

## 2014-01-27 DIAGNOSIS — M47812 Spondylosis without myelopathy or radiculopathy, cervical region: Secondary | ICD-10-CM | POA: Diagnosis not present

## 2014-01-27 DIAGNOSIS — M47816 Spondylosis without myelopathy or radiculopathy, lumbar region: Secondary | ICD-10-CM | POA: Diagnosis not present

## 2014-01-27 DIAGNOSIS — Z79891 Long term (current) use of opiate analgesic: Secondary | ICD-10-CM | POA: Diagnosis not present

## 2014-01-29 DIAGNOSIS — W19XXXA Unspecified fall, initial encounter: Secondary | ICD-10-CM | POA: Diagnosis not present

## 2014-01-29 DIAGNOSIS — Z882 Allergy status to sulfonamides status: Secondary | ICD-10-CM | POA: Diagnosis not present

## 2014-01-29 DIAGNOSIS — G629 Polyneuropathy, unspecified: Secondary | ICD-10-CM | POA: Diagnosis not present

## 2014-01-29 DIAGNOSIS — M25551 Pain in right hip: Secondary | ICD-10-CM | POA: Diagnosis not present

## 2014-01-29 DIAGNOSIS — S0990XA Unspecified injury of head, initial encounter: Secondary | ICD-10-CM | POA: Diagnosis not present

## 2014-01-29 DIAGNOSIS — R296 Repeated falls: Secondary | ICD-10-CM | POA: Diagnosis not present

## 2014-01-29 DIAGNOSIS — S0012XA Contusion of left eyelid and periocular area, initial encounter: Secondary | ICD-10-CM | POA: Diagnosis not present

## 2014-01-29 DIAGNOSIS — S6992XA Unspecified injury of left wrist, hand and finger(s), initial encounter: Secondary | ICD-10-CM | POA: Diagnosis not present

## 2014-01-29 DIAGNOSIS — Z880 Allergy status to penicillin: Secondary | ICD-10-CM | POA: Diagnosis not present

## 2014-01-29 DIAGNOSIS — Z9181 History of falling: Secondary | ICD-10-CM | POA: Diagnosis not present

## 2014-01-29 DIAGNOSIS — S79912A Unspecified injury of left hip, initial encounter: Secondary | ICD-10-CM | POA: Diagnosis not present

## 2014-01-29 DIAGNOSIS — S79911A Unspecified injury of right hip, initial encounter: Secondary | ICD-10-CM | POA: Diagnosis not present

## 2014-01-29 DIAGNOSIS — S0093XA Contusion of unspecified part of head, initial encounter: Secondary | ICD-10-CM | POA: Diagnosis not present

## 2014-01-29 DIAGNOSIS — Z9119 Patient's noncompliance with other medical treatment and regimen: Secondary | ICD-10-CM | POA: Diagnosis not present

## 2014-01-29 DIAGNOSIS — T148 Other injury of unspecified body region: Secondary | ICD-10-CM | POA: Diagnosis not present

## 2014-01-29 DIAGNOSIS — M25552 Pain in left hip: Secondary | ICD-10-CM | POA: Diagnosis not present

## 2014-01-29 DIAGNOSIS — I10 Essential (primary) hypertension: Secondary | ICD-10-CM | POA: Diagnosis not present

## 2014-01-29 DIAGNOSIS — Z79899 Other long term (current) drug therapy: Secondary | ICD-10-CM | POA: Diagnosis not present

## 2014-02-09 ENCOUNTER — Ambulatory Visit (INDEPENDENT_AMBULATORY_CARE_PROVIDER_SITE_OTHER): Payer: Medicare Other | Admitting: Nurse Practitioner

## 2014-02-09 ENCOUNTER — Encounter: Payer: Self-pay | Admitting: Nurse Practitioner

## 2014-02-09 VITALS — BP 116/68 | HR 74 | Temp 97.4°F | Ht 69.0 in | Wt 188.0 lb

## 2014-02-09 DIAGNOSIS — I10 Essential (primary) hypertension: Secondary | ICD-10-CM

## 2014-02-09 DIAGNOSIS — F329 Major depressive disorder, single episode, unspecified: Secondary | ICD-10-CM

## 2014-02-09 DIAGNOSIS — G609 Hereditary and idiopathic neuropathy, unspecified: Secondary | ICD-10-CM

## 2014-02-09 DIAGNOSIS — H8110 Benign paroxysmal vertigo, unspecified ear: Secondary | ICD-10-CM | POA: Insufficient documentation

## 2014-02-09 DIAGNOSIS — E876 Hypokalemia: Secondary | ICD-10-CM

## 2014-02-09 DIAGNOSIS — H8113 Benign paroxysmal vertigo, bilateral: Secondary | ICD-10-CM | POA: Diagnosis not present

## 2014-02-09 DIAGNOSIS — E785 Hyperlipidemia, unspecified: Secondary | ICD-10-CM | POA: Diagnosis not present

## 2014-02-09 DIAGNOSIS — F32A Depression, unspecified: Secondary | ICD-10-CM

## 2014-02-09 MED ORDER — ESCITALOPRAM OXALATE 10 MG PO TABS: 10.0000 mg | ORAL_TABLET | Freq: Every day | ORAL | Status: DC

## 2014-02-09 MED ORDER — MECLIZINE HCL 25 MG PO TABS: 25.0000 mg | ORAL_TABLET | Freq: Three times a day (TID) | ORAL | Status: DC | PRN

## 2014-02-09 NOTE — Progress Notes (Signed)
Subjective:    Patient ID: Brittany Mercer, female    DOB: 06/05/1945, 68 y.o.   MRN: [id number]  Patient here today for follow up of chronic medical problems.    Dizziness This is a new problem. The current episode started in the past 7 days. The problem occurs constantly. The problem has been unchanged. Pertinent negatives include no chest pain, headaches, myalgias or neck pain. The symptoms are aggravated by standing, twisting and walking. She has tried nothing for the symptoms.  Hypertension This is a chronic problem. The current episode started more than 1 year ago. The problem is controlled. Pertinent negatives include no chest pain, headaches, neck pain, palpitations or shortness of breath. Risk factors for coronary artery disease include post-menopausal state and dyslipidemia. Past treatments include beta blockers. There are no compliance problems.   Hyperlipidemia This is a chronic problem. The current episode started more than 1 year ago. The problem is controlled. Pertinent negatives include no chest pain, myalgias or shortness of breath. Current antihyperlipidemic treatment includes statins. Risk factors for coronary artery disease include post-menopausal, hypertension and dyslipidemia.  Hypokalemia Patient takes K+ daily. No c/o cramps in lower extremities Peripheral Neuropathy Nucyenta and oxycodone- Doing well- Patient in fight with ins to get nucyenta approved. Nucynta works better than oxycodone without the side effects. Dr. Francesco Runner.  * has a lesion on her back that has gotten bigger that she wants [place] to look at. She stated that the lesion has gotten smaller and is no longer painful.   * patient report seeing a neurologist for sleep walking. She has fallen several times, and wake up in different part of the house.   Review of Systems  Respiratory: Negative for shortness of breath.   Cardiovascular: Negative for chest pain and palpitations.  Musculoskeletal: Negative for  myalgias and neck pain.  Neurological: Positive for dizziness. Negative for headaches.  All other systems reviewed and are negative.      Objective:   Physical Exam  Constitutional: She is oriented to person, place, and time. She appears well-developed and well-nourished.  HENT:  Head: Normocephalic.  Eyes: Conjunctivae are normal. Pupils are equal, round, and reactive to light.  Neck: Normal range of motion.  Cardiovascular: Normal rate, regular rhythm and normal heart sounds.  Exam reveals no gallop.   No murmur heard. Pulmonary/Chest: Effort normal. No respiratory distress. She has no wheezes. She has no rales. She exhibits no tenderness.  Abdominal: Soft. Bowel sounds are normal.  Musculoskeletal: Normal range of motion. She exhibits no edema or tenderness.  Neurological: She is alert and oriented to person, place, and time. She has normal reflexes.  Skin: Skin is warm.  Psychiatric: She has a normal mood and affect. Her behavior is normal. Judgment and thought content normal.    BP 116/68 mmHg  Pulse 74  Temp(Src) 97.4 F (36.3 C) (Oral)  Ht 5\' 9"  (1.753 m)  Wt 188 lb (85.276 kg)  BMI 27.75 kg/m2       Assessment & Plan:  1. Essential hypertension, benign **low NA diet*  2. Hyperlipidemia with target LDL less than 100 Low fat diuet - NMR, lipoprofile  3. Hypokalemia  4. Hereditary and idiopathic peripheral neuropathy  5. Benign paroxysmal positional vertigo, bilateral  - meclizine (ANTIVERT) 25 MG tablet; Take 1 tablet (25 mg total) by mouth 3 (three) times daily as needed for dizziness.  Dispense: 90 tablet; Refill: 2  6. Depression Patient thinks that her problems with "sleep walking"  Could be  stressed related- and patient says that she knows that she is depressed. - escitalopram (LEXAPRO) 10 MG tablet; Take 1 tablet (10 mg total) by mouth daily.  Dispense: 30 tablet; Refill: 3    Labs pending Health maintenance reviewed Diet and exercise  encouraged Continue all meds Follow up  In 1 mont- for depression   Mary-Margaret Hassell Done, FNP

## 2014-02-09 NOTE — Progress Notes (Deleted)
° °  Subjective:    Patient ID: Brittany Mercer, female    DOB: April 24, 1945, 68 y.o.   MRN: [id number]  HPI    Review of Systems     Objective:   Physical Exam        Assessment & Plan:

## 2014-02-09 NOTE — Patient Instructions (Signed)

## 2014-02-10 LAB — NMR, LIPOPROFILE
Cholesterol: 180 mg/dL (ref 100–199)
HDL Cholesterol by NMR: 37 mg/dL — ABNORMAL LOW (ref >39)
HDL Particle Number: 35.2 umol/L (ref >=30.5)
LDL Particle Number: 1628 nmol/L — ABNORMAL HIGH (ref <1000)
LDL Size: 19.8 nm (ref >20.5)
LDL-C: 87 mg/dL (ref 0–99)
LP-IR Score: 91 — ABNORMAL HIGH (ref <=45)
Small LDL Particle Number: 1218 nmol/L — ABNORMAL HIGH (ref <=527)
Triglycerides by NMR: 279 mg/dL — ABNORMAL HIGH (ref 0–149)

## 2014-02-26 ENCOUNTER — Ambulatory Visit: Payer: Medicare Other | Admitting: Adult Health

## 2014-02-26 DIAGNOSIS — F329 Major depressive disorder, single episode, unspecified: Secondary | ICD-10-CM | POA: Diagnosis not present

## 2014-03-03 ENCOUNTER — Encounter: Payer: Self-pay | Admitting: Adult Health

## 2014-03-03 ENCOUNTER — Ambulatory Visit (INDEPENDENT_AMBULATORY_CARE_PROVIDER_SITE_OTHER): Payer: Medicare Other | Admitting: Adult Health

## 2014-03-03 VITALS — BP 129/75 | HR 68 | Temp 98.6°F | Ht 69.0 in | Wt 193.0 lb

## 2014-03-03 DIAGNOSIS — G609 Hereditary and idiopathic neuropathy, unspecified: Secondary | ICD-10-CM | POA: Diagnosis not present

## 2014-03-03 NOTE — Progress Notes (Signed)
I have read the note, and I agree with the clinical assessment and plan.  Mary Hockey KEITH   

## 2014-03-03 NOTE — Patient Instructions (Signed)

## 2014-03-03 NOTE — Progress Notes (Signed)
PATIENT: Brittany Mercer DOB: September 19, 1945  REASON FOR VISIT: follow up HISTORY FROM: patient  HISTORY OF PRESENT ILLNESS: Brittany Mercer is a 68 year old female with a history of peripheral neuropathy and depression. She returns today for follow-up. She was prescribed Cymbalta but could not tolerate it- she states that she felt sick all the time. Her prior NCS with EMG was unremarkable. Dr. Alden Hipp in [place] gave the patient nucynta to take for her neuropathy pain. She states that is working well for her at this time. She has burning and tingling in the feet and will occasionally have sharp pain that extends up to the knees. She reports that her gait has gotten a little clumsy. She states that her balance is " not good." The patient has had two falls since the last visit. She fell this morning states her rubber sole shoes got stuck on the carpet causing her to fall. She fell on the right side hitting her ribs. Denies any trouble breathing but states that her side is sore. She has a cane and walker but doesn't use them regularly. She continues to have episodes of sleepwalking however she states that does not happen very often. States that when she gets out of bed she is awake but will fall asleep while she is still up. Patient states that her mood is a little better- she is now seeing a psychologist. She is on lexapro for depression.  No new medical history since last seen.   HISTORY 10/27/13 (WILLIS): 68 year old right-handed white female with a history of a peripheral neuropathy that was originally diagnosed in 2008. She indicates that her last nerve conduction study was done in 2009, when she lived in [place], [place]. She does not have the report of the study with her. The patient was told that she had a significant peripheral neuropathy involving the arms and legs. She has had a lot of burning dysesthesias in the feet, and some occasional lancinating pains in the feet. She has been placed on  Topamax, but she has had cognitive side effects on this medication, with word finding problems. The patient believes that the Topamax has helped the pain some, however. The patient denies any significant balance issues. She indicates that she has been having some emotional stress following the death of her mother, and then her husband. The patient began having problems with sleepwalking that developed in April 2015. The patient indicates that she remembers getting out of bed, but she starts getting somewhat drowsy but she may fall off to sleep before she can get back to bed. The patient is becoming more withdrawn, and wants to sleep throughout the day. The patient is concerned she may be getting depressed. She denies any neck or low back pain, and she has had recent studies of the cervical and thoracic spine. A CT scan of the head was done in April 2015. The patient was felt to have signs of hyperreflexia on a recent examination, and she is sent to this office for an evaluation. She denies issues controlling the bowels or the bladder. She denies any significant gait stability problems during the daytime, and she does not have to use a cane or a walker. Her father also had a peripheral neuropathy. The patient indicates that a prior blood work evaluation did not show the etiology of her neuropathy problem  REVIEW OF SYSTEMS: Out of a complete 14 system review of symptoms, the patient complains only of the following symptoms, and all other  reviewed systems are negative.  Dizziness  ALLERGIES: Allergies  Allergen Reactions  . Penicillins   . Sulfa Antibiotics     HOME MEDICATIONS: Outpatient Prescriptions Prior to Visit  Medication Sig Dispense Refill  . atenolol (TENORMIN) 50 MG tablet Take 1 tablet (50 mg total) by mouth daily. 90 tablet 1  . atorvastatin (LIPITOR) 40 MG tablet Take 1 tablet (40 mg total) by mouth daily at 6 PM. 90 tablet 1  . escitalopram (LEXAPRO) 10 MG tablet Take 1 tablet (10 mg  total) by mouth daily. 30 tablet 3  . loratadine (CLARITIN) 10 MG tablet Take 10 mg by mouth daily as needed for allergies.    Marland Kitchen meclizine (ANTIVERT) 25 MG tablet Take 1 tablet (25 mg total) by mouth 3 (three) times daily as needed for dizziness. 90 tablet 2  . NUCYNTA 100 MG TABS Take 1 tablet by mouth as needed.     . potassium chloride (K-DUR) 10 MEQ tablet Take 2-3 tablets (20-30 mEq total) by mouth daily. 90 tablet 2  . triamterene-hydrochlorothiazide (MAXZIDE-25) 37.5-25 MG per tablet Take 1 tablet by mouth daily. 90 tablet 1  . Vitamin D, Ergocalciferol, (DRISDOL) 50000 UNITS CAPS capsule Take 1 capsule (50,000 Units total) by mouth every 7 (seven) days. 12 capsule 0   No facility-administered medications prior to visit.    PAST MEDICAL HISTORY: Past Medical History  Diagnosis Date  . Hypertension   . Hyperlipidemia   . Diverticulitis   . Peripheral neuropathy   . 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 Cesarean section    . Tonsillectomy    . Knee surgery      right  . Shoulder surgery Left   . Abdominal hysterectomy      partial - 1 ovary removed/ 1 ovary remains    FAMILY HISTORY: Family History  Problem Relation Age of Onset  . Hypertension Mother   . Hyperlipidemia Mother   . Stroke Mother   . Hypertension Father   . Dementia Father   . Neuropathy Father   . Hypertension Brother     SOCIAL HISTORY: History   Social History  . Marital Status: Married    Spouse Name: N/A    Number of Children: 2  . Years of Education: college   Occupational History  . Retired   . [employer]    Social History Main Topics  . Smoking status: Former Smoker -- 0.50 packs/day for 25 years    Types: Cigarettes    Quit date: 07/18/1998  . Smokeless tobacco: Not on file  . Alcohol Use: Yes     Comment: rarely  . Drug Use: No  . Sexual Activity: Not on file   Other Topics Concern  . Not on file    Social History Narrative      PHYSICAL EXAM  Filed Vitals:   03/03/14 0838  BP: 129/75  Pulse: 68  Temp: 98.6 F (37 C)  TempSrc: Oral  Height: 5\' 9"  (1.753 m)  Weight: 193 lb (87.544 kg)   Body mass index is 28.49 kg/(m^2).  Generalized: Well developed, in no acute distress   Neurological examination  Mentation: Alert oriented to time, place, history taking. Follows all commands speech and language fluent Cranial nerve II-XII: Pupils were equal round reactive to light. Extraocular movements were full, visual field were full on confrontational test. Facial sensation and strength were normal. Uvula tongue midline. Head turning and shoulder  shrug  were normal and symmetric. Motor: The motor testing reveals 5 over 5 strength of all 4 extremities. Good symmetric motor tone is noted throughout.  Sensory: Sensory testing is intact to soft touch on all 4 extremities. No evidence of extinction is noted.  Coordination: Cerebellar testing reveals good finger-nose-finger and heel-to-shin bilaterally.  Gait and station: Gait is slightly wide based. Tandem gait is slightly unsteady. Romberg is negative. No drift is seen.  Reflexes: Deep tendon reflexes are symmetric and normal bilaterally.    DIAGNOSTIC DATA (LABS, IMAGING, TESTING) - I reviewed patient records, labs, notes, testing and imaging myself where available.  Lab Results  Component Value Date   WBC 6.5 04/08/2013   HGB 13.8 04/08/2013   HCT 43.2 04/08/2013   MCV 94.8 04/08/2013      Component Value Date/Time   NA 141 01/23/2014 0938   NA 141 07/17/2012 0915   K 3.2* 01/23/2014 0938   CL 98 01/23/2014 0938   CO2 24 01/23/2014 0938   GLUCOSE 105* 01/23/2014 0938   GLUCOSE 102* 07/17/2012 0915   BUN 12 01/23/2014 0938   BUN 18 07/17/2012 0915   CREATININE 0.83 01/23/2014 0938   CREATININE 0.82 07/17/2012 0915   CALCIUM 9.8 01/23/2014 0938   PROT 6.4 10/07/2013 0839   PROT 5.9* 07/17/2012 0915   ALBUMIN 4.2  07/17/2012 0915   AST 17 10/07/2013 0839   ALT 19 10/07/2013 0839   ALKPHOS 74 10/07/2013 0839   BILITOT 0.9 10/07/2013 0839   GFRNONAA 73 01/23/2014 0938   GFRNONAA 75 07/17/2012 0915   GFRAA 84 01/23/2014 0938   GFRAA 86 07/17/2012 0915   Lab Results  Component Value Date   CHOL 180 02/09/2014   HDL 37* 02/09/2014   LDLCALC 100* 10/07/2013   TRIG 279* 02/09/2014   No results found for: HGBA1C No results found for: [id number] Lab Results  Component Value Date   TSH 1.730 04/08/2013      ASSESSMENT AND PLAN 68 y.o. year old female  has a past medical history of Hypertension; Hyperlipidemia; Diverticulitis; Peripheral neuropathy; and Somnambulism (10/27/2013). here with:  1. Peripheral Neuropathy  Patient neuropathy discomfort is controlled with Nucynta prescribed by Dr. Alden Hipp. Patient has had two falls since the last visit. I have encouraged the patient to use her cane or walker more often. If her falls become more frequent she should let us know. For now she can continue taking Nucynta, if her discomfort worsens she should let us know. She will follow-up in one year or sooner if needed.  Ward Givens, MSN, NP-C 03/03/2014, 8:50 AM [facility] [address] [place], [place] [address] [phone]  Note: This document was prepared with digital dictation and possible smart phrase technology. Any transcriptional errors that result from this process are unintentional.

## 2014-03-12 ENCOUNTER — Encounter: Payer: Self-pay | Admitting: Nurse Practitioner

## 2014-03-12 ENCOUNTER — Ambulatory Visit (INDEPENDENT_AMBULATORY_CARE_PROVIDER_SITE_OTHER): Payer: Medicare Other | Admitting: Nurse Practitioner

## 2014-03-12 VITALS — BP 115/65 | HR 66 | Temp 99.1°F | Ht 69.0 in | Wt 192.0 lb

## 2014-03-12 DIAGNOSIS — F4321 Adjustment disorder with depressed mood: Secondary | ICD-10-CM

## 2014-03-12 DIAGNOSIS — F329 Major depressive disorder, single episode, unspecified: Secondary | ICD-10-CM

## 2014-03-12 DIAGNOSIS — Z634 Disappearance and death of family member: Secondary | ICD-10-CM

## 2014-03-12 DIAGNOSIS — F4329 Adjustment disorder with other symptoms: Secondary | ICD-10-CM

## 2014-03-12 DIAGNOSIS — F4381 Prolonged grief disorder: Secondary | ICD-10-CM

## 2014-03-12 DIAGNOSIS — F32A Depression, unspecified: Secondary | ICD-10-CM

## 2014-03-12 MED ORDER — ESCITALOPRAM OXALATE 20 MG PO TABS: 20.0000 mg | ORAL_TABLET | Freq: Every day | ORAL | Status: DC

## 2014-03-12 NOTE — Progress Notes (Signed)
   Subjective:    Patient ID: Brittany Mercer, female    DOB: Nov 25, 1945, 68 y.o.   MRN: [id number]  HPI Patient is in today for follow up of depression- She was started on lexapro at last visit- she says that she is not doing any different. She has fallen a couple of times and has a broken rib and that has her down.     Review of Systems  Constitutional: Negative.   HENT: Negative.   Cardiovascular: Negative.   Gastrointestinal: Negative.   Genitourinary: Negative.   Neurological: Negative.   Psychiatric/Behavioral: Negative.   All other systems reviewed and are negative.      Objective:   Physical Exam  Constitutional: She appears well-developed and well-nourished.  Cardiovascular: Normal rate, regular rhythm and normal heart sounds.   Pulmonary/Chest: Effort normal and breath sounds normal.  Neurological: She is alert.  Skin: Skin is warm.  Psychiatric: Her speech is normal and behavior is normal. Judgment and thought content normal. Cognition and memory are normal. She exhibits a depressed mood.   BP 115/65 mmHg  Pulse 66  Temp(Src) 99.1 F (37.3 C) (Oral)  Ht 5\' 9"  (1.753 m)  Wt 192 lb (87.091 kg)  BMI 28.34 kg/m2        Assessment & Plan:   1. Depression   2. Complicated grief    Meds ordered this encounter  Medications  . escitalopram (LEXAPRO) 20 MG tablet    Sig: Take 1 tablet (20 mg total) by mouth daily.    Dispense:  30 tablet    Refill:  5    Order Specific Question:  Supervising Provider    Answer:  Chipper Herb [[id number]]   Stress management Continue grief counseliing Follow up in 2 months and prn  Mary-Margaret Hassell Done, FNP

## 2014-03-12 NOTE — Patient Instructions (Signed)
Grief Reaction Grief is a normal response to the death of someone close to you. Feelings of fear, anger, and guilt can affect almost everyone who loses someone they love. Symptoms of depression are also common. These include problems with sleep, loss of appetite, and lack of energy. These grief reaction symptoms often last for weeks to months after a loss. They may also return during special times that remind you of the person you lost, such as an anniversary or birthday. Anxiety, insomnia, irritability, and deep depression may last beyond the period of normal grief. If you experience these feelings for 6 months or longer, you may have clinical depression. Clinical depression requires further medical attention. If you think that you have clinical depression, you should contact your caregiver. If you have a history of depression or a family history of depression, you are at greater risk of clinical depression. You are also at greater risk of developing clinical depression if the loss was traumatic or the loss was of someone with whom you had unresolved issues.  A grief reaction can become complicated by being blocked. This means being unable to cry or express extreme emotions. This may prolong the grieving period and worsen the emotional effects of the loss. Mourning is a natural event in human life. A healthy grief reaction is one that is not blocked. It requires a time of sadness and readjustment. It is very important to share your sorrow and fear with others, especially close friends and family. Professional counselors and clergy can also help you process your grief. Document Released: 03/13/2005 Document Revised: 07/28/2013 Document Reviewed: 11/21/2005 [facility] Patient Information 2015 [place], [place]. This information is not intended to replace advice given to you by your health care provider. Make sure you discuss any questions you have with your health care provider.

## 2014-03-26 DIAGNOSIS — F329 Major depressive disorder, single episode, unspecified: Secondary | ICD-10-CM | POA: Diagnosis not present

## 2014-04-22 ENCOUNTER — Other Ambulatory Visit: Payer: Self-pay | Admitting: Nurse Practitioner

## 2014-05-13 ENCOUNTER — Other Ambulatory Visit: Payer: Self-pay | Admitting: Pharmacist

## 2014-05-14 DIAGNOSIS — M47816 Spondylosis without myelopathy or radiculopathy, lumbar region: Secondary | ICD-10-CM | POA: Diagnosis not present

## 2014-05-14 DIAGNOSIS — F329 Major depressive disorder, single episode, unspecified: Secondary | ICD-10-CM | POA: Diagnosis not present

## 2014-05-14 DIAGNOSIS — M47812 Spondylosis without myelopathy or radiculopathy, cervical region: Secondary | ICD-10-CM | POA: Diagnosis not present

## 2014-05-14 DIAGNOSIS — M47814 Spondylosis without myelopathy or radiculopathy, thoracic region: Secondary | ICD-10-CM | POA: Diagnosis not present

## 2014-06-08 ENCOUNTER — Encounter: Payer: Self-pay | Admitting: Nurse Practitioner

## 2014-06-08 ENCOUNTER — Ambulatory Visit (INDEPENDENT_AMBULATORY_CARE_PROVIDER_SITE_OTHER): Payer: Medicare Other | Admitting: Nurse Practitioner

## 2014-06-08 VITALS — BP 102/64 | HR 58 | Temp 97.7°F | Ht 69.0 in | Wt 176.0 lb

## 2014-06-08 DIAGNOSIS — I10 Essential (primary) hypertension: Secondary | ICD-10-CM | POA: Diagnosis not present

## 2014-06-08 DIAGNOSIS — E876 Hypokalemia: Secondary | ICD-10-CM

## 2014-06-08 DIAGNOSIS — L570 Actinic keratosis: Secondary | ICD-10-CM | POA: Diagnosis not present

## 2014-06-08 DIAGNOSIS — L821 Other seborrheic keratosis: Secondary | ICD-10-CM | POA: Diagnosis not present

## 2014-06-08 DIAGNOSIS — H8113 Benign paroxysmal vertigo, bilateral: Secondary | ICD-10-CM | POA: Diagnosis not present

## 2014-06-08 DIAGNOSIS — E785 Hyperlipidemia, unspecified: Secondary | ICD-10-CM

## 2014-06-08 DIAGNOSIS — G609 Hereditary and idiopathic neuropathy, unspecified: Secondary | ICD-10-CM | POA: Diagnosis not present

## 2014-06-08 DIAGNOSIS — Z85828 Personal history of other malignant neoplasm of skin: Secondary | ICD-10-CM | POA: Diagnosis not present

## 2014-06-08 DIAGNOSIS — F513 Sleepwalking [somnambulism]: Secondary | ICD-10-CM

## 2014-06-08 NOTE — Patient Instructions (Signed)

## 2014-06-08 NOTE — Progress Notes (Signed)
Subjective:    Patient ID: Brittany Mercer, female    DOB: [birth year], 69 y.o.   MRN: [id number]  Patient here today for follow up of chronic medical problems. She reports stopping her Lipitor about a month ago due to muscle ache. She reports recent falls about a week ago, she reports hitting her head and had a black eye (left) as a result. She denies any current headaches.   Dizziness This is a new problem. The current episode started in the past 7 days. The problem occurs constantly. The problem has been unchanged. Pertinent negatives include no chest pain, headaches, myalgias or neck pain. The symptoms are aggravated by standing, twisting and walking. She has tried nothing for the symptoms.  Hypertension This is a chronic problem. The current episode started more than 1 year ago. The problem is controlled. Pertinent negatives include no chest pain, headaches, neck pain, palpitations or shortness of breath. Risk factors for coronary artery disease include post-menopausal state and dyslipidemia. Past treatments include beta blockers. There are no compliance problems.   Hyperlipidemia This is a chronic problem. The current episode started more than 1 year ago. The problem is controlled. Recent lipid tests were reviewed and are high. Pertinent negatives include no chest pain, myalgias or shortness of breath. Risk factors for coronary artery disease include post-menopausal, hypertension and dyslipidemia.  Hypokalemia Patient takes K+ daily. No c/o cramps in lower extremities Peripheral Neuropathy Nucyenta and oxycodone- Doing well- Patient in fight with ins to get nucyenta approved. Nucynta works better than oxycodone without the side effects. Dr. Francesco Runner.  *patient is currently seeing a neurologist for recurrent falls. Her last appointment was several months ago.   *patient reports a sore spot on the sole of her right foot that she noticed two weeks ago. She reports gradually worsening. She has  applied iodine to it. She    Review of Systems  Respiratory: Negative for shortness of breath.   Cardiovascular: Negative for chest pain and palpitations.  Musculoskeletal: Negative for myalgias and neck pain.  Neurological: Positive for dizziness. Negative for headaches.  All other systems reviewed and are negative.      Objective:   Physical Exam  Constitutional: She is oriented to person, place, and time. She appears well-developed and well-nourished.  HENT:  Head: Normocephalic.  Eyes: Conjunctivae are normal. Pupils are equal, round, and reactive to light.  Neck: Normal range of motion.  Cardiovascular: Normal rate, regular rhythm and normal heart sounds.  Exam reveals no gallop.   No murmur heard. Pulmonary/Chest: Effort normal. No respiratory distress. She has no wheezes. She has no rales. She exhibits no tenderness.  Abdominal: Soft. Bowel sounds are normal.  Musculoskeletal: Normal range of motion. She exhibits no edema or tenderness.  Neurological: She is alert and oriented to person, place, and time. She has normal reflexes.  Skin: Skin is warm.  Psychiatric: She has a normal mood and affect. Her behavior is normal. Judgment and thought content normal.    BP 102/64 mmHg  Pulse 58  Temp(Src) 97.7 F (36.5 C) (Oral)  Ht _0  (1.753 m)  Wt 176 lb (79.833 kg)  BMI 25.98 kg/m2       Assessment & Plan:  1. Essential hypertension, benign Do not add slat to diet - CMP14+EGFR  2. Hypokalemia  3. Hyperlipidemia with target LDL less than 100 Low fat  especiallt sent stopping statin - NMR, lipoprofile  4. Benign paroxysmal positional vertigo, bilateral Keep follow up appointment with neurologisyt  5. Hereditary and idiopathic peripheral neuropathy  6. Somnambulism   hemoccult cards given to patient with directions Labs pending Health maintenance reviewed Diet and exercise encouraged Continue all meds Follow up  In 6 month   [place],  FNP

## 2014-06-09 LAB — CMP14+EGFR
ALT: 18 IU/L (ref 0–32)
AST: 13 IU/L (ref 0–40)
Albumin/Globulin Ratio: 2.2 (ref 1.1–2.5)
Albumin: 4.2 g/dL (ref 3.6–4.8)
Alkaline Phosphatase: 71 IU/L (ref 39–117)
BUN/Creatinine Ratio: 25 (ref 11–26)
BUN: 22 mg/dL (ref 8–27)
Bilirubin Total: 0.4 mg/dL (ref 0.0–1.2)
CO2: 29 mmol/L (ref 18–29)
Calcium: 9.8 mg/dL (ref 8.7–10.3)
Chloride: 97 mmol/L (ref 97–108)
Creatinine, Ser: 0.89 mg/dL (ref 0.57–1.00)
GFR calc Af Amer: 77 mL/min/{1.73_m2} (ref >59)
GFR calc non Af Amer: 67 mL/min/{1.73_m2} (ref >59)
Globulin, Total: 1.9 g/dL (ref 1.5–4.5)
Glucose: 72 mg/dL (ref 65–99)
Potassium: 3.7 mmol/L (ref 3.5–5.2)
Sodium: 141 mmol/L (ref 134–144)
Total Protein: 6.1 g/dL (ref 6.0–8.5)

## 2014-06-09 LAB — NMR, LIPOPROFILE
Cholesterol: 217 mg/dL — ABNORMAL HIGH (ref 100–199)
HDL Cholesterol by NMR: 31 mg/dL — ABNORMAL LOW (ref >39)
HDL Particle Number: 20.2 umol/L — ABNORMAL LOW (ref >=30.5)
LDL Particle Number: 2186 nmol/L — ABNORMAL HIGH (ref <1000)
LDL Size: 20.7 nm (ref >20.5)
LDL-C: 124 mg/dL — ABNORMAL HIGH (ref 0–99)
LP-IR Score: 80 — ABNORMAL HIGH (ref <=45)
Small LDL Particle Number: 1234 nmol/L — ABNORMAL HIGH (ref <=527)
Triglycerides by NMR: 308 mg/dL — ABNORMAL HIGH (ref 0–149)

## 2014-06-11 ENCOUNTER — Telehealth: Payer: Self-pay | Admitting: Nurse Practitioner

## 2014-06-11 ENCOUNTER — Other Ambulatory Visit: Payer: Medicare Other

## 2014-06-11 DIAGNOSIS — Z1212 Encounter for screening for malignant neoplasm of rectum: Secondary | ICD-10-CM

## 2014-06-11 NOTE — Telephone Encounter (Signed)
Stp she had questions regarding the stool card sample and where she needed to mail the card. Advised pt to bring the envelope and all back to the office, pt voiced understanding.

## 2014-06-11 NOTE — Progress Notes (Signed)
Lab only 

## 2014-06-13 LAB — FECAL OCCULT BLOOD, IMMUNOCHEMICAL: Fecal Occult Bld: NEGATIVE

## 2014-08-07 ENCOUNTER — Ambulatory Visit (INDEPENDENT_AMBULATORY_CARE_PROVIDER_SITE_OTHER): Payer: Medicare Other | Admitting: Family Medicine

## 2014-08-07 ENCOUNTER — Encounter: Payer: Self-pay | Admitting: Family Medicine

## 2014-08-07 ENCOUNTER — Encounter (INDEPENDENT_AMBULATORY_CARE_PROVIDER_SITE_OTHER): Payer: Self-pay

## 2014-08-07 ENCOUNTER — Ambulatory Visit (INDEPENDENT_AMBULATORY_CARE_PROVIDER_SITE_OTHER): Payer: Medicare Other

## 2014-08-07 VITALS — BP 92/61 | HR 68 | Temp 98.7°F | Ht 69.0 in | Wt 174.0 lb

## 2014-08-07 DIAGNOSIS — M25561 Pain in right knee: Secondary | ICD-10-CM | POA: Diagnosis not present

## 2014-08-07 DIAGNOSIS — R2689 Other abnormalities of gait and mobility: Secondary | ICD-10-CM

## 2014-08-07 DIAGNOSIS — G609 Hereditary and idiopathic neuropathy, unspecified: Secondary | ICD-10-CM

## 2014-08-07 MED ORDER — PREDNISONE 10 MG PO TABS: ORAL_TABLET | ORAL | Status: DC

## 2014-08-07 NOTE — Progress Notes (Signed)
Subjective:  Patient ID: Brittany Mercer, female    DOB: April 24, 1945  Age: 69 y.o. MRN: [id number]  CC: Knee Pain   HPI Brittany Mercer presents for anterior right knee pain. She has been off balance and feeling that it's giving way on her at times. She does not have any history of falling. Pain is moderately severe and increasing in severity.  Also having multiple episodes of numbness and tingling in the leg. This is not limited to the knee that is both proximal and distal.  History [name] has a past medical history of Hypertension; Hyperlipidemia; Diverticulitis; Peripheral neuropathy; and Somnambulism (10/27/2013).   She has past surgical history that includes Cesarean section; Tonsillectomy; Knee surgery; Shoulder surgery (Left); and Abdominal hysterectomy.   Her family history includes Dementia in her father; Hyperlipidemia in her mother; Hypertension in her brother, father, and mother; Neuropathy in her father; Stroke in her mother.She reports that she quit smoking about 16 years ago. Her smoking use included Cigarettes. She has a 12.5 pack-year smoking history. She does not have any smokeless tobacco history on file. She reports that she drinks alcohol. She reports that she does not use illicit drugs.  Outpatient Prescriptions Prior to Visit  Medication Sig Dispense Refill  . atenolol (TENORMIN) 50 MG tablet Take 1 tablet (50 mg total) by mouth daily. 90 tablet 1  . escitalopram (LEXAPRO) 20 MG tablet Take 1 tablet (20 mg total) by mouth daily. 30 tablet 5  . loratadine (CLARITIN) 10 MG tablet Take 10 mg by mouth daily as needed for allergies.    Marland Kitchen meclizine (ANTIVERT) 25 MG tablet Take 1 tablet (25 mg total) by mouth 3 (three) times daily as needed for dizziness. 90 tablet 2  . NUCYNTA 100 MG TABS Take 1 tablet by mouth as needed.     . potassium chloride (K-DUR) 10 MEQ tablet TAKE 2 TO 3 TABLETS DAILY AS DIRECTED 90 tablet 1  . topiramate (TOPAMAX) 25 MG tablet     .  triamterene-hydrochlorothiazide (MAXZIDE-25) 37.5-25 MG per tablet Take 1 tablet by mouth daily. 90 tablet 1  . atorvastatin (LIPITOR) 40 MG tablet Take 1 tablet (40 mg total) by mouth daily at 6 PM. (Patient not taking: Reported on 06/08/2014) 90 tablet 1   No facility-administered medications prior to visit.    ROS Review of Systems  Constitutional: Negative for fever, chills, diaphoresis, appetite change, fatigue and unexpected weight change.  HENT: Negative for congestion, ear pain, hearing loss, postnasal drip, rhinorrhea, sneezing, sore throat and trouble swallowing.   Eyes: Negative for pain.  Respiratory: Negative for cough, chest tightness and shortness of breath.   Cardiovascular: Negative for chest pain and palpitations.  Gastrointestinal: Negative for nausea, vomiting, abdominal pain, diarrhea and constipation.  Genitourinary: Negative for dysuria, frequency and menstrual problem.  Musculoskeletal: Positive for joint swelling and arthralgias.  Skin: Negative for rash.  Neurological: Positive for weakness and numbness. Negative for dizziness, speech difficulty and headaches.  Psychiatric/Behavioral: Negative for dysphoric mood and agitation.    Objective:  BP 92/61 mmHg  Pulse 68  Temp(Src) 98.7 F (37.1 C) (Oral)  Ht 5\' 9"  (1.753 m)  Wt 174 lb (78.926 kg)  BMI 25.68 kg/m2  BP Readings from Last 3 Encounters:  08/07/14 92/61  06/08/14 102/64  03/12/14 115/65    Wt Readings from Last 3 Encounters:  08/07/14 174 lb (78.926 kg)  06/08/14 176 lb (79.833 kg)  03/12/14 192 lb (87.091 kg)     Physical Exam  Constitutional:  She is oriented to person, place, and time. She appears well-developed and well-nourished. No distress.  HENT:  Head: Normocephalic and atraumatic.  Right Ear: External ear normal.  Left Ear: External ear normal.  Nose: Nose normal.  Mouth/Throat: Oropharynx is clear and moist.  Eyes: Conjunctivae and EOM are normal. Pupils are equal, round,  and reactive to light.  Neck: Normal range of motion. Neck supple. No thyromegaly present.  Cardiovascular: Normal rate, regular rhythm and normal heart sounds.   No murmur heard. Pulmonary/Chest: Effort normal and breath sounds normal. No respiratory distress. She has no wheezes. She has no rales.  Abdominal: Soft. Bowel sounds are normal. She exhibits no distension. There is no tenderness.  Lymphadenopathy:    She has no cervical adenopathy.  Neurological: She is alert and oriented to person, place, and time. She has normal reflexes.  Skin: Skin is warm and dry.  Psychiatric: She has a normal mood and affect. Her behavior is normal. Judgment and thought content normal.    No results found for: HGBA1C  Lab Results  Component Value Date   WBC 6.5 04/08/2013   HGB 13.8 04/08/2013   HCT 43.2 04/08/2013   GLUCOSE 72 06/08/2014   CHOL 217* 06/08/2014   TRIG 308* 06/08/2014   HDL 31* 06/08/2014   LDLCALC 100* 10/07/2013   ALT 18 06/08/2014   AST 13 06/08/2014   NA 141 06/08/2014   K 3.7 06/08/2014   CL 97 06/08/2014   CREATININE 0.89 06/08/2014   BUN 22 06/08/2014   CO2 29 06/08/2014   TSH 1.730 04/08/2013    Ct Head Wo Contrast  06/25/2013   CLINICAL DATA:  Pain post trauma  EXAM: CT HEAD WITHOUT CONTRAST  TECHNIQUE: Contiguous axial images were obtained from the base of the skull through the vertex without intravenous contrast.  COMPARISON:  None.  FINDINGS: The ventricles are normal in size and configuration. There is no mass, hemorrhage, extra-axial fluid collection, or midline shift. Gray-white compartments appear normal. There is no demonstrable acute infarct.  Minimal basal ganglia calcification is felt to be physiologic. Bony calvarium appears intact. The mastoid air cells are clear. There is a right frontal scalp hematoma. There is minimal mucosal thickening in the left maxillary antrum. There is patchy sinus disease in the left sphenoid sinus.  IMPRESSION: Right frontal scalp  hematoma. Areas of paranasal sinus disease. No intracranial mass, hemorrhage, or extra-axial fluid. No evidence suggesting acute infarct.   Electronically Signed   By: Lowella Grip M.D.   On: 06/25/2013 12:30    Assessment & Plan:   Timothea was seen today for knee pain.  Diagnoses and all orders for this visit:  Right knee pain Orders: -     DG Knee 1-2 Views Right -     Ambulatory referral to Physical Therapy -     Ambulatory referral to Orthopedic Surgery  Unstable balance Orders: -     Ambulatory referral to Physical Therapy  Hereditary and idiopathic peripheral neuropathy Orders: -     Ambulatory referral to Neurology  Other orders -     predniSONE (DELTASONE) 10 MG tablet; Take 5 daily for 3 days followed by 4,3,2 and 1 for 3 days each.   I have discontinued Ms. Leitch's atorvastatin. I am also having her start on predniSONE. Additionally, I am having her maintain her NUCYNTA, loratadine, meclizine, escitalopram, triamterene-hydrochlorothiazide, atenolol, potassium chloride, and topiramate.  Meds ordered this encounter  Medications  . predniSONE (DELTASONE) 10 MG tablet  Sig: Take 5 daily for 3 days followed by 4,3,2 and 1 for 3 days each.    Dispense:  45 tablet    Refill:  0     Follow-up: Return in about 3 months (around 11/07/2014).  Claretta Fraise, M.D.

## 2014-08-18 DIAGNOSIS — M47812 Spondylosis without myelopathy or radiculopathy, cervical region: Secondary | ICD-10-CM | POA: Diagnosis not present

## 2014-08-18 DIAGNOSIS — Z79891 Long term (current) use of opiate analgesic: Secondary | ICD-10-CM | POA: Diagnosis not present

## 2014-08-18 DIAGNOSIS — G609 Hereditary and idiopathic neuropathy, unspecified: Secondary | ICD-10-CM | POA: Diagnosis not present

## 2014-08-18 DIAGNOSIS — M1990 Unspecified osteoarthritis, unspecified site: Secondary | ICD-10-CM | POA: Diagnosis not present

## 2014-08-26 ENCOUNTER — Ambulatory Visit: Payer: Medicare Other | Attending: Family Medicine | Admitting: Physical Therapy

## 2014-08-26 DIAGNOSIS — M25661 Stiffness of right knee, not elsewhere classified: Secondary | ICD-10-CM | POA: Insufficient documentation

## 2014-08-26 DIAGNOSIS — M25561 Pain in right knee: Secondary | ICD-10-CM | POA: Insufficient documentation

## 2014-08-26 NOTE — Therapy (Signed)
[facility] [address] [place], [place], [address] Phone: [phone]   Fax:  [phone]  Physical Therapy Evaluation  Patient Details  Name: Brittany Mercer MRN: [id number] Date of Birth: 08/05/1945 Referring Provider:  Mechele Claude, MD  Encounter Date: 08/26/2014      PT End of Session - 08/26/14 1229    Visit Number 1   Number of Visits 12   Date for PT Re-Evaluation 10/07/14   PT Start Time 1030   PT Stop Time 1122   PT Time Calculation (min) 52 min   Activity Tolerance Patient tolerated treatment well      Past Medical History  Diagnosis Date  . Hypertension   . Hyperlipidemia   . Diverticulitis   . Peripheral neuropathy   . 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 Cesarean section    . Tonsillectomy    . Knee surgery      right  . Shoulder surgery Left   . Abdominal hysterectomy      partial - 1 ovary removed/ 1 ovary remains    There were no vitals filed for this visit.  Visit Diagnosis:  Right knee pain - Plan: PT plan of care cert/re-cert  Knee stiffness, right - Plan: PT plan of care cert/re-cert      Subjective Assessment - 08/26/14 1222    Subjective Right knee hurts very badly after beeing seated for an extended period of time.   Limitations Sitting   How long can you sit comfortably? 20-25 minutes.   Patient Stated Goals Get out of my right knee pain.            [facility] PT Assessment - 08/26/14 0001    Assessment   Medical Diagnosis Right knee pain.   Onset Date/Surgical Date --  02/2014.   Precautions   Precautions None   Restrictions   Weight Bearing Restrictions No   Balance Screen   Has the patient fallen in the past 6 months Yes   How many times? 1   Has the patient had a decrease in activity level because of a fear of falling?  Yes   Is the patient reluctant to leave their home because of a fear of falling?  Yes   Home Environment   Living Environment Private residence   Prior Function   Level of Independence Independent   Observation/Other Assessments-Edema    Edema --  Minimal+ left knee edema.   ROM / Strength   AROM / PROM / Strength AROM;Strength   AROM   Overall AROM Comments -12 degrees to 120 degrees.   Strength   Overall Strength Comments --  Left knee strength= 4+/5.   Palpation   Patella mobility Moderate decrease in left knee patellar mobility in all directions.   Palpation comment Tender to palpation over left knee medial joint line.   Ambulation/Gait   Gait Comments Very antalgic gait pattern with decreased left LE stance time and lack of left knee flexion.                   [facility] Adult PT Treatment/Exercise - 08/26/14 0001    Modalities   Modalities Electrical Stimulation   Electrical Stimulation   Electrical Stimulation Location Right knee.   Electrical Stimulation Action IFC at 80-150 HX at 100% scan x 20 minutes.   Electrical Stimulation Goals Pain  PT Short Term Goals - 09-01-14 1250    PT SHORT TERM GOAL #1   Title Ind with HEP.   Time 6   Period Weeks   Status New           PT Long Term Goals - 09/01/2014 1251    PT LONG TERM GOAL #1   Title Active right knee extension to 0 degrees.   PT LONG TERM GOAL #2   Title Perform ADL's with right knee pain not > 3/10.   Time 6   Period Weeks   Status New   PT LONG TERM GOAL #3   Title Sit 30 minutes then transition to standing with pain not > 3-4/10.   Time 6   Period Weeks   Status New               Plan - Sep 01, 2014 1244    Clinical Impression Statement The patient has had ongoing and worsening right knee pain since December.  She states her pain can rise to nearly a 10/10 after being seated for an extended period of time and then rising from a seated position.   Pt will benefit from skilled therapeutic intervention in order to improve on the following deficits Pain;Decreased  activity tolerance;Decreased strength;Decreased range of motion   Rehab Potential Good   PT Frequency 2x / week   PT Duration 6 weeks   PT Treatment/Interventions Electrical Stimulation;Ultrasound;Moist Heat;Cryotherapy;Therapeutic exercise;Therapeutic activities;Neuromuscular re-education;Passive range of motion   PT Next Visit Plan Stationary bike; Pain-free right quadriceps strengthening.  Right patellar mobs.  Modalities PRN.  Extension stretching.   Consulted and Agree with Plan of Care Patient          G-Codes - 09-01-14 1250    Functional Assessment Tool Used FOTO.   Functional Limitation Mobility: Walking and moving around   Mobility: Walking and Moving Around Current Status [id number]) At least 40 percent but less than 60 percent impaired, limited or restricted   Mobility: Walking and Moving Around Goal Status [phone]) At least 20 percent but less than 40 percent impaired, limited or restricted       Problem List Patient Active Problem List   Diagnosis Date Noted  . Benign paroxysmal positional vertigo 02/09/2014  . 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 Essential hypertension, benign 07/17/2012  . Hyperlipidemia with target LDL less than 100 07/17/2012  . Migraines 07/17/2012  . Hereditary and idiopathic peripheral neuropathy 07/17/2012  . Hypokalemia 07/17/2012    Yvaine Jankowiak, [place] MPT 09/01/14, 12:56 PM  [facility] [address] [place], [place], [address] Phone: [phone]   Fax:  [phone]

## 2014-08-31 ENCOUNTER — Encounter: Payer: Self-pay | Admitting: *Deleted

## 2014-08-31 ENCOUNTER — Ambulatory Visit: Payer: Medicare Other | Admitting: *Deleted

## 2014-08-31 DIAGNOSIS — M25661 Stiffness of right knee, not elsewhere classified: Secondary | ICD-10-CM

## 2014-08-31 DIAGNOSIS — M25561 Pain in right knee: Secondary | ICD-10-CM

## 2014-08-31 NOTE — Patient Instructions (Signed)
Quad Set   Slowly tighten muscles on thigh of straight leg while counting out loud to __3 seconds__. Repeat with other leg. Repeat _10-15___ times. Do __2-3__ sessions per day.  Strengthening: Straight Leg Raise (Phase 1)   Tighten muscles on front of right thigh, then lift leg _12___ inches from surface, keeping knee locked.  Repeat __10-15__ times per set. Do _3___ sets per session. Do _2-3___ sessions per day.

## 2014-08-31 NOTE — Therapy (Signed)
[facility] [address] [place], [place], [address] Phone: [phone]   Fax:  [phone]  Physical Therapy Treatment  Patient Details  Name: Brittany Mercer MRN: [id number] Date of Birth: [birth year] Referring Provider:  Bennie Pierini, *  Encounter Date: 08/31/2014      PT End of Session - 08/31/14 1214    Visit Number 2   Number of Visits 12   Date for PT Re-Evaluation 10/07/14   PT Start Time 1115   PT Stop Time 1204   PT Time Calculation (min) 49 min   Activity Tolerance Patient tolerated treatment well      Past Medical History  Diagnosis Date  . Hypertension   . Hyperlipidemia   . Diverticulitis   . Peripheral neuropathy   . 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 Cesarean section    . Tonsillectomy    . Knee surgery      right  . Shoulder surgery Left   . Abdominal hysterectomy      partial - 1 ovary removed/ 1 ovary remains    There were no vitals filed for this visit.  Visit Diagnosis:  Right knee pain  Knee stiffness, right      Subjective Assessment - 08/31/14 1116    Subjective Right knee hurts very badly after beeing seated for an extended period of time.   Limitations Sitting   How long can you sit comfortably? 20-25 minutes.   Patient Stated Goals Get out of my right knee pain.                         [facility] Adult PT Treatment/Exercise - 08/31/14 0001    Exercises   Exercises Knee/Hip   Knee/Hip Exercises: Stretches   Active Hamstring Stretch 5 reps;30 seconds  with ankle pumps for calf stretching   Knee/Hip Exercises: Aerobic   Stationary Bike x 10 mins no resistance   Knee/Hip Exercises: Supine   Heel Slides AROM;Right;20 reps   Terminal Knee Extension AROM;Right;20 reps  LAQs   Straight Leg Raises AROM;Right;1 set;15 reps   Modalities   Modalities --   Manual Therapy   Manual Therapy Soft tissue mobilization;Passive  ROM   Soft tissue mobilization STW to posterior aspect RT knee including HSs and Calves   Passive ROM PROM for extension ROM and patella mobs with Pt supine                PT Education - 08/31/14 1200    Education provided Yes   Education Details LAQs, SLR,HSS,   Home TENS instruction   Person(s) Educated Patient   Methods Explanation;Demonstration;Tactile cues;Handout;Verbal cues   Comprehension Verbalized understanding;Returned demonstration          PT Short Term Goals - 08/26/14 1250    PT SHORT TERM GOAL #1   Title Ind with HEP.   Time 6   Period Weeks   Status New           PT Long Term Goals - 08/26/14 1251    PT LONG TERM GOAL #1   Title Active right knee extension to 0 degrees.   PT LONG TERM GOAL #2   Title Perform ADL's with right knee pain not > 3/10.   Time 6   Period Weeks   Status New   PT LONG TERM GOAL #3   Title Sit 30 minutes then transition to standing with pain not >  3-4/10.   Time 6   Period Weeks   Status New               Plan - 08/31/14 1215    Pt will benefit from skilled therapeutic intervention in order to improve on the following deficits Pain;Decreased activity tolerance;Decreased strength;Decreased range of motion   PT Frequency 2x / week   PT Duration 6 weeks   PT Treatment/Interventions Electrical Stimulation;Ultrasound;Moist Heat;Cryotherapy;Therapeutic exercise;Therapeutic activities;Neuromuscular re-education;Passive range of motion   PT Next Visit Plan Stationary bike; Pain-free right quadriceps strengthening.  Right patellar mobs.  Modalities PRN.  Extension stretching. [place]        Problem List Patient Active Problem List   Diagnosis Date Noted  . Benign paroxysmal positional vertigo 02/09/2014  . 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 Essential hypertension, benign 07/17/2012  . Hyperlipidemia with target LDL less than 100 07/17/2012  . Migraines 07/17/2012  . Hereditary and idiopathic peripheral neuropathy  07/17/2012  . Hypokalemia 07/17/2012    Katlyne Nishida,CHRIS,PTA 08/31/2014, 12:58 PM  [facility] [address] [place], [place], [address] Phone: [phone]   Fax:  [phone]

## 2014-09-03 ENCOUNTER — Encounter: Payer: Self-pay | Admitting: *Deleted

## 2014-09-03 ENCOUNTER — Ambulatory Visit: Payer: Medicare Other | Admitting: *Deleted

## 2014-09-03 DIAGNOSIS — M25661 Stiffness of right knee, not elsewhere classified: Secondary | ICD-10-CM | POA: Diagnosis not present

## 2014-09-03 DIAGNOSIS — M25561 Pain in right knee: Secondary | ICD-10-CM | POA: Diagnosis not present

## 2014-09-03 NOTE — Therapy (Signed)
[facility] [place], [place], [address] Phone: [phone]   Fax:  [phone]  Physical Therapy Treatment  Patient Details  Name: Brittany Mercer MRN: [id number] Date of Birth: [birth year] Referring Provider:  Chevis Pretty, *  Encounter Date: 09/03/2014      PT End of Session - 09/03/14 1134    Visit Number 3   Number of Visits 12   Date for PT Re-Evaluation 10/07/14   PT Start Time 1115   PT Stop Time 1287   PT Time Calculation (min) 50 min      Past Medical History  Diagnosis Date  . Hypertension   . Hyperlipidemia   . Diverticulitis   . Peripheral neuropathy   . 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 Cesarean section    . Tonsillectomy    . Knee surgery      right  . Shoulder surgery Left   . Abdominal hysterectomy      partial - 1 ovary removed/ 1 ovary remains    There were no vitals filed for this visit.  Visit Diagnosis:  Right knee pain  Knee stiffness, right      Subjective Assessment - 09/03/14 1124    Subjective Right knee hurts very badly after beeing seated for an extended period of time.    Limitations Sitting   How long can you sit comfortably? 20-25 minutes.   Patient Stated Goals Get out of my right knee pain.   Currently in Pain? Yes   Pain Score 3    Pain Location Knee   Pain Orientation Right   Pain Descriptors / Indicators Aching   Pain Type Chronic pain   Pain Onset More than a month ago   Aggravating Factors  sitting to long   Pain Relieving Factors movement                         [facility] Adult PT Treatment/Exercise - 09/03/14 0001    Knee/Hip Exercises: Stretches   Active Hamstring Stretch 5 reps;30 seconds  with ankle pumps for calf stretching   Knee/Hip Exercises: Aerobic   Stationary Bike x 10 mins no resistance   Knee/Hip Exercises: Standing   Rocker Board 3 minutes  calf stretching   Modalities   Modalities Ultrasound   Ultrasound   Ultrasound Location RT KNEE Lateral/ medial Jt line   Ultrasound Parameters 1.5 w/cm2 x 10 mins   Ultrasound Goals Pain   Manual Therapy   Manual Therapy Passive ROM;Myofascial release   Soft tissue mobilization STW/IASTM to lateral/ medial aspect RT knee    Passive ROM PROM for extension ROM and patella mobs with Pt supine. Contract/ relax HS stretching for RT leg                  PT Short Term Goals - 09/03/14 1752    PT SHORT TERM GOAL #1   Title Ind with HEP.   Period Weeks   Status Achieved           PT Long Term Goals - 08/26/14 1251    PT LONG TERM GOAL #1   Title Active right knee extension to 0 degrees.   PT LONG TERM GOAL #2   Title Perform ADL's with right knee pain not > 3/10.   Time 6   Period Weeks   Status New   PT LONG TERM GOAL #3   Title Sit  30 minutes then transition to standing with pain not > 3-4/10.   Time 6   Period Weeks   Status New               Plan - 09/03/14 1747    Clinical Impression Statement Pt fairly well after Rx today and had less pain after Rx. Her HSs were notably tight, but had good results with contract/relax stretching. RT patella still remains hypo-mobile, but loosend with mobs. Goals are on-going at this time   Pt will benefit from skilled therapeutic intervention in order to improve on the following deficits Pain;Decreased activity tolerance;Decreased strength;Decreased range of motion   Rehab Potential Good   PT Treatment/Interventions Electrical Stimulation;Ultrasound;Moist Heat;Cryotherapy;Therapeutic exercise;Therapeutic activities;Neuromuscular re-education;Passive range of motion   PT Next Visit Plan Stationary bike; Pain-free right quadriceps strengthening.  Right patellar mobs.  Modalities PRN.  Extension stretching. 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 Benign paroxysmal positional vertigo 02/09/2014  . 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 Essential hypertension, benign 07/17/2012  . Hyperlipidemia with target LDL less than 100 07/17/2012  . Migraines 07/17/2012  . Hereditary and idiopathic peripheral neuropathy 07/17/2012  . Hypokalemia 07/17/2012    RAMSEUR,CHRIS, PTA 09/03/2014, 5:54 PM  [facility] [address] [place], [place], [address] Phone: [phone]   Fax:  [phone]

## 2014-09-07 ENCOUNTER — Ambulatory Visit: Payer: Medicare Other | Admitting: Physical Therapy

## 2014-09-07 ENCOUNTER — Encounter: Payer: Self-pay | Admitting: Physical Therapy

## 2014-09-07 DIAGNOSIS — M25661 Stiffness of right knee, not elsewhere classified: Secondary | ICD-10-CM

## 2014-09-07 DIAGNOSIS — M25561 Pain in right knee: Secondary | ICD-10-CM | POA: Diagnosis not present

## 2014-09-07 NOTE — Therapy (Signed)
[facility] [address] [place], [place], [address] Phone: [phone]   Fax:  [phone]  Physical Therapy Treatment  Patient Details  Name: Brittany Mercer MRN: [id number] Date of Birth: 1945/06/20 Referring Provider:  Bennie Pierini, *  Encounter Date: 09/07/2014      PT End of Session - 09/07/14 1146    Visit Number 4   Number of Visits 12   Date for PT Re-Evaluation 10/07/14   PT Start Time 1114   PT Stop Time 1159   PT Time Calculation (min) 45 min   Activity Tolerance Patient tolerated treatment well   Behavior During Therapy [facility] for tasks assessed/performed      Past Medical History  Diagnosis Date  . Hypertension   . Hyperlipidemia   . Diverticulitis   . Peripheral neuropathy   . 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 Cesarean section    . Tonsillectomy    . Knee surgery      right  . Shoulder surgery Left   . Abdominal hysterectomy      partial - 1 ovary removed/ 1 ovary remains    There were no vitals filed for this visit.  Visit Diagnosis:  Right knee pain  Knee stiffness, right      Subjective Assessment - 09/07/14 1117    Subjective sitting causes more pain thann standing   Limitations Sitting   How long can you sit comfortably? 20-25 minutes.   Patient Stated Goals Get out of my right knee pain.   Pain Score 5    Pain Location Knee   Pain Orientation Right   Pain Descriptors / Indicators Sore;Aching   Pain Type Chronic pain   Pain Onset More than a month ago   Aggravating Factors  prolong sitting   Pain Relieving Factors movement                         [facility] Adult PT Treatment/Exercise - 09/07/14 0001    Knee/Hip Exercises: Aerobic   Stationary Bike x 10 mins no resistance   Knee/Hip Exercises: Standing   Forward Step Up Right;3 sets;10 reps;Step Height: 6"   Rocker Board 3 minutes   Modalities   Modalities  Cryotherapy   Cryotherapy   Number Minutes Cryotherapy 15 Minutes   Cryotherapy Location Knee   Type of Cryotherapy --  vasopnumatic   Electrical Stimulation   Electrical Stimulation Location Right knee.   Electrical Stimulation Action IFC   Electrical Stimulation Parameters 1-10HZ    Electrical Stimulation Goals Pain   Manual Therapy   Manual Therapy Passive ROM   Passive ROM low load hold for ext and patella mobs                  PT Short Term Goals - 09/03/14 1752    PT SHORT TERM GOAL #1   Title Ind with HEP.   Period Weeks   Status Achieved           PT Long Term Goals - 09/07/14 1153    PT LONG TERM GOAL #1   Title Active right knee extension to 0 degrees.   Time 6   Period Weeks   Status On-going   PT LONG TERM GOAL #2   Title Perform ADL's with right knee pain not > 3/10.   Time 6   Period Weeks   Status On-going   PT LONG TERM GOAL #3  Title Sit 30 minutes then transition to standing with pain not > 3-4/10.   Time 6   Period Weeks   Status On-going               Plan - 09/07/14 1148    Clinical Impression Statement Patient progressing slowly due to pain in right knee. Patint has tenderness with patella mobs and c/o more pain with prolong sitting. Patient doing good with HEP. Patient has an appt with MD. Charlann Boxer on 08/27/14. Goals ongoing due to pain and ROM limitations.   Pt will benefit from skilled therapeutic intervention in order to improve on the following deficits Pain;Decreased activity tolerance;Decreased strength;Decreased range of motion   Rehab Potential Good   PT Frequency 2x / week   PT Duration 6 weeks   PT Treatment/Interventions Electrical Stimulation;Ultrasound;Moist Heat;Cryotherapy;Therapeutic exercise;Therapeutic activities;Neuromuscular re-education;Passive range of motion   PT Next Visit Plan Stationary bike; Pain-free right quadriceps strengthening.  Right patellar mobs.  Modalities PRN.  Extension stretching. 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 Benign paroxysmal positional vertigo 02/09/2014  . 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 Essential hypertension, benign 07/17/2012  . Hyperlipidemia with target LDL less than 100 07/17/2012  . Migraines 07/17/2012  . Hereditary and idiopathic peripheral neuropathy 07/17/2012  . Hypokalemia 07/17/2012    Selene Peltzer P, PTA 09/07/2014, 12:04 PM  [facility] [address] [place], [place], [address] Phone: [phone]   Fax:  [phone]

## 2014-09-10 ENCOUNTER — Ambulatory Visit: Payer: Medicare Other | Admitting: Physical Therapy

## 2014-09-10 ENCOUNTER — Encounter: Payer: Self-pay | Admitting: Physical Therapy

## 2014-09-10 DIAGNOSIS — M25561 Pain in right knee: Secondary | ICD-10-CM | POA: Diagnosis not present

## 2014-09-10 DIAGNOSIS — M25661 Stiffness of right knee, not elsewhere classified: Secondary | ICD-10-CM

## 2014-09-10 NOTE — Therapy (Signed)
[facility] [place], [place], [address] Phone: [phone]   Fax:  [phone]  Physical Therapy Treatment  Patient Details  Name: Brittany Mercer MRN: [id number] Date of Birth: [birth year] Referring Provider:  Chevis Pretty, *  Encounter Date: 09/10/2014      PT End of Session - 09/10/14 1121    Visit Number 5   Number of Visits 12   Date for PT Re-Evaluation 10/07/14   PT Start Time 1116   PT Stop Time 1213   PT Time Calculation (min) 57 min   Activity Tolerance Patient tolerated treatment well   Behavior During Therapy [facility] for tasks assessed/performed      Past Medical History  Diagnosis Date  . Hypertension   . Hyperlipidemia   . Diverticulitis   . Peripheral neuropathy   . 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 Cesarean section    . Tonsillectomy    . Knee surgery      right  . Shoulder surgery Left   . Abdominal hysterectomy      partial - 1 ovary removed/ 1 ovary remains    There were no vitals filed for this visit.  Visit Diagnosis:  Right knee pain  Knee stiffness, right      Subjective Assessment - 09/10/14 1118    Subjective States that yesterday she was driving to and from [place] and sat through lunch with a friend which lasted in total 4 hours and had no pain. Did not have to take Ibuprofen last night. Reports that PT has really helped her in not having as much pain in the morning time as she used to but always has pain in the nigt time due to neuropathy.   Limitations Sitting   How long can you sit comfortably? 20-25 minutes.   Patient Stated Goals Get out of my right knee pain.   Currently in Pain? Yes   Pain Score 2    Pain Location Knee   Pain Orientation Right   Pain Descriptors / Indicators Shooting  Reports that on bike when she extends R knee she experiences shooting pain   Pain Type Chronic pain   Pain Onset More than a  month ago            [facility] PT Assessment - 09/10/14 0001    Assessment   Medical Diagnosis Right knee pain.   Onset Date/Surgical Date --  02/2014   Next MD Visit 09/18/2014                     [facility] Adult PT Treatment/Exercise - 09/10/14 0001    Knee/Hip Exercises: Aerobic   Stationary Bike Bike x12 min no resistance   Knee/Hip Exercises: Standing   Lateral Step Up Right;3 sets;10 reps;Step Height: 6"   Forward Step Up Right;3 sets;10 reps;Step Height: 6"   Rocker Board 3 minutes   Knee/Hip Exercises: Seated   Long Arc Quad Strengthening;Right;3 sets;10 reps   Long Arc Quad Weight 2 lbs.   Modalities   Modalities Cryotherapy;Electrical Stimulation   Cryotherapy   Number Minutes Cryotherapy 15 Minutes   Cryotherapy Location Knee   Type of Cryotherapy Other (comment)  Vasopneumatic   Electrical Stimulation   Electrical Stimulation Location Right knee.   Electrical Stimulation Action IFC   Electrical Stimulation Parameters 1-10 Hz x26min   Electrical Stimulation Goals Pain   Manual Therapy   Manual Therapy Passive ROM;Soft tissue mobilization  Soft tissue mobilization R patellar mobs into L/R, sup/inf; difficult due to patient report that R knee will never be able to straighten completely   Passive ROM PROM R knee into flex/ext with gentle holds at end range                  PT Short Term Goals - 09/03/14 1752    PT SHORT TERM GOAL #1   Title Ind with HEP.   Period Weeks   Status Achieved           PT Long Term Goals - 09/10/14 1207    PT LONG TERM GOAL #1   Title Active right knee extension to 0 degrees.   Time 6   Period Weeks   Status On-going   PT LONG TERM GOAL #2   Title Perform ADL's with right knee pain not > 3/10.   Time 6   Period Weeks   Status Achieved   PT LONG TERM GOAL #3   Title Sit 30 minutes then transition to standing with pain not > 3-4/10.   Time 6   Period Weeks   Status Achieved  Reports that she can  transition following sitting for 30 min with pain less than 3/10 but continues to take time in order for patient to be able to walk without pain following standing.               Plan - 09/10/14 1203    Clinical Impression Statement Patient tolerated treatment well today only expressing fatigue following the forward and lateral step ups. Achieved 2/3 LT goal today for ADLs and sit/stand pain ratings. Minimal increased swelling observed in the R knee mostly in the inferior aspect before PROM. Completed therapeutic exercises with only minimal verbal cueing and demonstration for technique. R patellar mobilizations difficult secondary to lack of R knee extension and patient report that R knee will never be able to extend fully again. Normal modalites response noted following removal of the modalties. FOTO limitation at intake 58%, current limitation 50% per 5th visit FOTO. Denied pain following treatment.   Pt will benefit from skilled therapeutic intervention in order to improve on the following deficits Pain;Decreased activity tolerance;Decreased strength;Decreased range of motion   Rehab Potential Good   PT Frequency 2x / week   PT Duration 6 weeks   PT Treatment/Interventions Electrical Stimulation;Ultrasound;Moist Heat;Cryotherapy;Therapeutic exercise;Therapeutic activities;Neuromuscular re-education;Passive range of motion   PT Next Visit Plan Continue per MPT POC. Please route next week's notes to Dr. Alvan Dame and Dr. Livia Snellen.   Consulted and Agree with Plan of Care Patient        Problem List Patient Active Problem List   Diagnosis Date Noted  . Benign paroxysmal positional vertigo 02/09/2014  . 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 Essential hypertension, benign 07/17/2012  . Hyperlipidemia with target LDL less than 100 07/17/2012  . Migraines 07/17/2012  . Hereditary and idiopathic peripheral neuropathy 07/17/2012  . Hypokalemia 07/17/2012    Wynelle Fanny, PTA 09/10/2014, 12:19  PM  [facility] [address] [place], [place], [address] Phone: [phone]   Fax:  [phone]

## 2014-09-14 ENCOUNTER — Encounter: Payer: Self-pay | Admitting: Physical Therapy

## 2014-09-14 ENCOUNTER — Ambulatory Visit: Payer: Medicare Other | Admitting: Physical Therapy

## 2014-09-14 ENCOUNTER — Other Ambulatory Visit: Payer: Self-pay | Admitting: Nurse Practitioner

## 2014-09-14 DIAGNOSIS — M25661 Stiffness of right knee, not elsewhere classified: Secondary | ICD-10-CM

## 2014-09-14 DIAGNOSIS — M25561 Pain in right knee: Secondary | ICD-10-CM

## 2014-09-14 NOTE — Therapy (Signed)
[facility] [address] [place], [place], [address] Phone: [phone]   Fax:  [phone]  Physical Therapy Treatment  Patient Details  Name: Brittany Mercer MRN: [id number] Date of Birth: 1946/01/18 Referring Provider:  Bennie Pierini, *  Encounter Date: 09/14/2014      PT End of Session - 09/14/14 1152    Visit Number 6   Number of Visits 12   Date for PT Re-Evaluation 10/07/14   PT Start Time 1114   PT Stop Time 1205   PT Time Calculation (min) 51 min   Activity Tolerance Patient tolerated treatment well   Behavior During Therapy [facility] for tasks assessed/performed      Past Medical History  Diagnosis Date  . Hypertension   . Hyperlipidemia   . Diverticulitis   . Peripheral neuropathy   . 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 Cesarean section    . Tonsillectomy    . Knee surgery      right  . Shoulder surgery Left   . Abdominal hysterectomy      partial - 1 ovary removed/ 1 ovary remains    There were no vitals filed for this visit.  Visit Diagnosis:  Right knee pain  Knee stiffness, right      Subjective Assessment - 09/14/14 1117    Subjective less pain overall and able to sit longer at a time, going to see neurologist and orthopedic doctor to cover all basis on pain possiblities   Limitations Sitting   How long can you sit comfortably? 20-25 minutes initial and up to 2 hours last week   Patient Stated Goals Get out of my right knee pain.   Currently in Pain? Yes   Pain Score 2    Pain Location Knee   Pain Orientation Right   Pain Descriptors / Indicators Sore   Pain Type Chronic pain   Pain Onset More than a month ago   Aggravating Factors  prolong sitting   Pain Relieving Factors rest and movement            [facility] PT Assessment - 09/14/14 0001    ROM / Strength   AROM / PROM / Strength PROM   AROM   Overall AROM  Deficits   Overall AROM  Comments -8 degrees ext   PROM   Overall PROM  Deficits   Overall PROM Comments -5 degrees   PROM Assessment Site Knee   Right/Left Knee Right                     [facility] Adult PT Treatment/Exercise - 09/14/14 0001    Knee/Hip Exercises: Aerobic   Stationary Bike Bike x1 min no resistance   Knee/Hip Exercises: Standing   Lateral Step Up Right;3 sets;10 reps;Step Height: 6"   Forward Step Up Right;3 sets;10 reps;Step Height: 6"   Rocker Board 2 minutes   Knee/Hip Exercises: Seated   Long Arc Quad Strengthening;Right;3 sets;10 reps   Long Arc Quad Weight 3 lbs.   Modalities   Modalities Moist Heat   Moist Heat Therapy   Number Minutes Moist Heat 15 Minutes   Moist Heat Location Knee   Electrical Stimulation   Electrical Stimulation Location Right knee.   Electrical Stimulation Action IFC   Electrical Stimulation Parameters 1-10HZ    Electrical Stimulation Goals Pain   Manual Therapy   Manual Therapy Passive ROM;Soft tissue mobilization   Soft tissue mobilization R patellar mobs  Passive ROM PROM R knee into ext with gentle holds at end range                  PT Short Term Goals - 09/03/14 1752    PT SHORT TERM GOAL #1   Title Ind with HEP.   Period Weeks   Status Achieved           PT Long Term Goals - 09/10/14 1207    PT LONG TERM GOAL #1   Title Active right knee extension to 0 degrees.   Time 6   Period Weeks   Status On-going   PT LONG TERM GOAL #2   Title Perform ADL's with right knee pain not > 3/10.   Time 6   Period Weeks   Status Achieved   PT LONG TERM GOAL #3   Title Sit 30 minutes then transition to standing with pain not > 3-4/10.   Time 6   Period Weeks   Status Achieved  Reports that she can transition following sitting for 30 min with pain less than 3/10 but continues to take time in order for patient to be able to walk without pain following standing.               Plan - 09/14/14 1208    Clinical  Impression Statement Patient tolerated treatment well today. Patient has felt a lot of improvement overall and less pain. Patient was able to sit for two hours at a time with no pain. Patient has improved ROM in right knee ext today yet goal ongoing due to limitation.   Pt will benefit from skilled therapeutic intervention in order to improve on the following deficits Pain;Decreased activity tolerance;Decreased strength;Decreased range of motion   Rehab Potential Good   PT Frequency 2x / week   PT Duration 6 weeks   PT Treatment/Interventions Electrical Stimulation;Ultrasound;Moist Heat;Cryotherapy;Therapeutic exercise;Therapeutic activities;Neuromuscular re-education;Passive range of motion   Consulted and Agree with Plan of Care Patient        Problem List Patient Active Problem List   Diagnosis Date Noted  . Benign paroxysmal positional vertigo 02/09/2014  . 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 Essential hypertension, benign 07/17/2012  . Hyperlipidemia with target LDL less than 100 07/17/2012  . Migraines 07/17/2012  . Hereditary and idiopathic peripheral neuropathy 07/17/2012  . Hypokalemia 07/17/2012    Shawnique Mariotti P, PTA 09/14/2014, 12:15 PM  [facility] [address] [place], [place], [address] Phone: [phone]   Fax:  [phone]

## 2014-09-16 ENCOUNTER — Encounter: Payer: Medicare Other | Admitting: Physical Therapy

## 2014-09-16 ENCOUNTER — Ambulatory Visit: Payer: Medicare Other | Admitting: Nurse Practitioner

## 2014-09-16 DIAGNOSIS — G8929 Other chronic pain: Secondary | ICD-10-CM | POA: Diagnosis not present

## 2014-09-16 DIAGNOSIS — M25561 Pain in right knee: Secondary | ICD-10-CM | POA: Diagnosis not present

## 2014-09-17 ENCOUNTER — Encounter: Payer: Medicare Other | Admitting: Physical Therapy

## 2014-09-18 ENCOUNTER — Encounter: Payer: Self-pay | Admitting: Family Medicine

## 2014-09-18 ENCOUNTER — Ambulatory Visit (INDEPENDENT_AMBULATORY_CARE_PROVIDER_SITE_OTHER): Payer: Medicare Other | Admitting: Family Medicine

## 2014-09-18 VITALS — BP 108/64 | HR 59 | Temp 99.1°F | Ht 69.0 in | Wt 173.4 lb

## 2014-09-18 DIAGNOSIS — E785 Hyperlipidemia, unspecified: Secondary | ICD-10-CM | POA: Diagnosis not present

## 2014-09-18 DIAGNOSIS — M609 Myositis, unspecified: Secondary | ICD-10-CM | POA: Diagnosis not present

## 2014-09-18 DIAGNOSIS — M791 Myalgia: Secondary | ICD-10-CM

## 2014-09-18 DIAGNOSIS — IMO0001 Reserved for inherently not codable concepts without codable children: Secondary | ICD-10-CM

## 2014-09-18 DIAGNOSIS — I10 Essential (primary) hypertension: Secondary | ICD-10-CM

## 2014-09-18 MED ORDER — EZETIMIBE 10 MG PO TABS: 10.0000 mg | ORAL_TABLET | Freq: Every day | ORAL | Status: DC

## 2014-09-18 NOTE — Progress Notes (Signed)
Subjective:  Patient ID: Brittany Mercer, female    DOB: 06-26-1945  Age: 69 y.o. MRN: [id number]  CC: Leg Pain and Hypertension   HPI Brittany Mercer presents for  follow-up of hypertension. Patient has no history of headache chest pain or shortness of breath or recent cough. Patient also denies symptoms of TIA such as numbness weakness lateralizing. Patient checks  blood pressure at home and has not had any elevated readings recently. Patient denies side effects from his medication. States taking it regularly.  States statin caused severe muscle ache in the legs. Went away when Brittany Mercer. Has tried 3 forms with similar results.   History Brittany Mercer has a past medical history of Hypertension; Hyperlipidemia; Diverticulitis; Peripheral neuropathy; and Somnambulism (10/27/2013).   She has past surgical history that includes Cesarean section; Tonsillectomy; Knee surgery; Shoulder surgery (Left); and Abdominal hysterectomy.   Her family history includes Dementia in her father; Hyperlipidemia in her mother; Hypertension in her brother, father, and mother; Neuropathy in her father; Stroke in her mother.She reports that she quit smoking about 16 years ago. Her smoking use included Cigarettes. She has a 12.5 pack-year smoking history. She does not have any smokeless tobacco history on file. She reports that she drinks alcohol. She reports that she does not use illicit drugs.  Current Outpatient Prescriptions on File Prior to Visit  Medication Sig Dispense Refill  . atenolol (TENORMIN) 50 MG tablet Take 1 tablet (50 mg total) by mouth daily. 90 tablet 1  . escitalopram (LEXAPRO) 20 MG tablet Take 1 tablet (20 mg total) by mouth daily. 30 tablet 5  . loratadine (CLARITIN) 10 MG tablet Take 10 mg by mouth daily as needed for allergies.    Marland Kitchen meclizine (ANTIVERT) 25 MG tablet Take 1 tablet (25 mg total) by mouth 3 (three) times daily as needed for dizziness. 90 tablet 2  . NUCYNTA 100 MG TABS Take 1 tablet  by mouth as needed.     . potassium chloride (K-DUR,KLOR-CON) 10 MEQ tablet TAKE 2 TO 3 TABLETS DAILY AS DIRECTED 90 tablet 0  . topiramate (TOPAMAX) 25 MG tablet     . triamterene-hydrochlorothiazide (MAXZIDE-25) 37.5-25 MG per tablet Take 1 tablet by mouth daily. 90 tablet 1   No current facility-administered medications on file prior to visit.    ROS Review of Systems  Constitutional: Negative for fever, chills, diaphoresis, appetite change, fatigue and unexpected weight change.  HENT: Negative for congestion, ear pain, hearing loss, postnasal drip, rhinorrhea, sneezing, sore throat and trouble swallowing.   Eyes: Negative for pain.  Respiratory: Negative for cough, chest tightness and shortness of breath.   Cardiovascular: Negative for chest pain and palpitations.  Gastrointestinal: Negative for nausea, vomiting, abdominal pain, diarrhea and constipation.  Genitourinary: Negative for dysuria, frequency and menstrual problem.  Musculoskeletal: Negative for joint swelling and arthralgias.  Skin: Negative for rash.  Neurological: Negative for dizziness, weakness, numbness and headaches.  Psychiatric/Behavioral: Negative for dysphoric mood and agitation.    Objective:  BP 108/64 mmHg  Pulse 59  Temp(Src) 99.1 F (37.3 C) (Oral)  Ht 5\' 9"  (1.753 m)  Wt 173 lb 6.4 oz (78.654 kg)  BMI 25.60 kg/m2  BP Readings from Last 3 Encounters:  09/18/14 108/64  08/07/14 92/61  06/08/14 102/64    Wt Readings from Last 3 Encounters:  09/18/14 173 lb 6.4 oz (78.654 kg)  08/07/14 174 lb (78.926 kg)  06/08/14 176 lb (79.833 kg)     Physical Exam  Constitutional: She is oriented  to person, place, and time. She appears well-developed and well-nourished. No distress.  HENT:  Head: Normocephalic and atraumatic.  Right Ear: External ear normal.  Left Ear: External ear normal.  Nose: Nose normal.  Mouth/Throat: Oropharynx is clear and moist.  Eyes: Conjunctivae and EOM are normal. Pupils  are equal, round, and reactive to light.  Neck: Normal range of motion. Neck supple. No thyromegaly present.  Cardiovascular: Normal rate, regular rhythm and normal heart sounds.   No murmur heard. Pulmonary/Chest: Effort normal and breath sounds normal. No respiratory distress. She has no wheezes. She has no rales.  Abdominal: Soft. Bowel sounds are normal. She exhibits no distension. There is no tenderness.  Lymphadenopathy:    She has no cervical adenopathy.  Neurological: She is alert and oriented to person, place, and time. She has normal reflexes.  Skin: Skin is warm and dry.  Psychiatric: She has a normal mood and affect. Her behavior is normal. Judgment and thought content normal.    No results found for: HGBA1C  Lab Results  Component Value Date   WBC 6.5 04/08/2013   HGB 13.8 04/08/2013   HCT 43.2 04/08/2013   GLUCOSE 72 06/08/2014   CHOL 217* 06/08/2014   TRIG 308* 06/08/2014   HDL 31* 06/08/2014   LDLCALC 100* 10/07/2013   ALT 18 06/08/2014   AST 13 06/08/2014   NA 141 06/08/2014   K 3.7 06/08/2014   CL 97 06/08/2014   CREATININE 0.89 06/08/2014   BUN 22 06/08/2014   CO2 29 06/08/2014   TSH 1.730 04/08/2013    Ct Head Wo Contrast  06/25/2013   CLINICAL DATA:  Pain post trauma  EXAM: CT HEAD WITHOUT CONTRAST  TECHNIQUE: Contiguous axial images were obtained from the base of the skull through the vertex without intravenous contrast.  COMPARISON:  None.  FINDINGS: The ventricles are normal in size and configuration. There is no mass, hemorrhage, extra-axial fluid collection, or midline shift. Gray-white compartments appear normal. There is no demonstrable acute infarct.  Minimal basal ganglia calcification is felt to be physiologic. Bony calvarium appears intact. The mastoid air cells are clear. There is a right frontal scalp hematoma. There is minimal mucosal thickening in the left maxillary antrum. There is patchy sinus disease in the left sphenoid sinus.  IMPRESSION:  Right frontal scalp hematoma. Areas of paranasal sinus disease. No intracranial mass, hemorrhage, or extra-axial fluid. No evidence suggesting acute infarct.   Electronically Signed   By: Lowella Grip M.D.   On: 06/25/2013 12:30    Assessment & Plan:   Anye was seen today for leg pain and hypertension.  Diagnoses and all orders for this visit:  Essential hypertension, benign  Hyperlipidemia with target LDL less than 100  Myalgia and myositis  Other orders -     ezetimibe (ZETIA) 10 MG tablet; Take 1 tablet (10 mg total) by mouth daily. For cholesterol  I have discontinued Ms. Piechocki's predniSONE. I am also having her start on ezetimibe. Additionally, I am having her maintain her NUCYNTA, loratadine, meclizine, escitalopram, triamterene-hydrochlorothiazide, atenolol, topiramate, and potassium chloride.  Meds ordered this encounter  Medications  . ezetimibe (ZETIA) 10 MG tablet    Sig: Take 1 tablet (10 mg total) by mouth daily. For cholesterol    Dispense:  90 tablet    Refill:  3     Follow-up: Return in about 1 month (around 10/18/2014).  Claretta Fraise, M.D.

## 2014-09-21 ENCOUNTER — Encounter: Payer: Self-pay | Admitting: Physical Therapy

## 2014-09-21 ENCOUNTER — Ambulatory Visit: Payer: Medicare Other | Admitting: Physical Therapy

## 2014-09-21 DIAGNOSIS — M25561 Pain in right knee: Secondary | ICD-10-CM | POA: Diagnosis not present

## 2014-09-21 DIAGNOSIS — M25661 Stiffness of right knee, not elsewhere classified: Secondary | ICD-10-CM | POA: Diagnosis not present

## 2014-09-21 NOTE — Patient Instructions (Signed)
  Step-Down / Step-Up   Stand on stair step or __6__ inch stool. Slowly bend left leg, lowering other foot to floor. Return by straightening front leg. Repeat __10__ times per set. Do __2-3__ sets per session. Do __2-3__ sessions per day.    Strengthening: Hip Abduction (Side-Lying)   Tighten muscles on front of left thigh, then lift leg __5__ inches from surface, keeping knee locked.  Repeat __10__ times per set. Do __2__ sets per session. Do _2___ sessions per day.  Knee Extension Mobilization: Towel Prop   With rolled towel under right ankle, place _1-5___ pound weight across knee. Hold __5+__ minutes. Repeat __2-3__ times per set. Do __2__ sets per session. Do __2-4__ sessions per day.

## 2014-09-21 NOTE — Therapy (Signed)
[facility] [address] [place], [place], [address] Phone: [phone]   Fax:  [phone]  Physical Therapy Treatment  Patient Details  Name: Brittany Mercer MRN: [id number] Date of Birth: 1946-01-02 Referring Provider:  Bennie Pierini, *  Encounter Date: 09/21/2014      PT End of Session - 09/21/14 1315    Visit Number 7   Number of Visits 12   Date for PT Re-Evaluation 10/07/14   PT Start Time 1231   PT Stop Time 1328   PT Time Calculation (min) 57 min   Activity Tolerance Patient tolerated treatment well   Behavior During Therapy [facility] for tasks assessed/performed      Past Medical History  Diagnosis Date  . Hypertension   . Hyperlipidemia   . Diverticulitis   . Peripheral neuropathy   . 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 Cesarean section    . Tonsillectomy    . Knee surgery      right  . Shoulder surgery Left   . Abdominal hysterectomy      partial - 1 ovary removed/ 1 ovary remains    There were no vitals filed for this visit.  Visit Diagnosis:  Right knee pain  Knee stiffness, right      Subjective Assessment - 09/21/14 1236    Subjective went to ortho MD and will eventually have full knee replacement and will go to neurologist on friday. had a lot of pain wednesday for unknown reason. better today per patient   Limitations Sitting   How long can you sit comfortably? 20-25 minutes initial and up to 2 hours last week   Patient Stated Goals Get out of my right knee pain.   Currently in Pain? Yes   Pain Score 2    Pain Location Knee   Pain Orientation Right   Pain Descriptors / Indicators Sore   Pain Type Chronic pain   Pain Onset More than a month ago   Aggravating Factors  prolong sitting   Pain Relieving Factors up and moving around            [facility] PT Assessment - 09/21/14 0001    AROM   Overall AROM  Deficits   Overall AROM Comments -8  degrees ext   PROM   Overall PROM  Deficits   Overall PROM Comments -5 degrees   PROM Assessment Site Knee   Right/Left Knee Right                     [facility] Adult PT Treatment/Exercise - 09/21/14 0001    Knee/Hip Exercises: Aerobic   Stationary Bike Nustep L3 x   Knee/Hip Exercises: Standing   Lateral Step Up Right;3 sets;10 reps;Step Height: 6"   Forward Step Up Right;3 sets;10 reps;Step Height: 6"   Rocker Board 2 minutes   Knee/Hip Exercises: Seated   Long Arc Quad Strengthening;Right;3 sets;10 reps   Long Arc Quad Weight 4 lbs.   Cryotherapy   Number Minutes Cryotherapy 15 Minutes   Cryotherapy Location Knee   Type of Cryotherapy --  vasopnumatic   Electrical Stimulation   Electrical Stimulation Location Right knee.   Electrical Stimulation Action IFC   Electrical Stimulation Parameters 1-10HZ    Electrical Stimulation Goals Pain   Manual Therapy   Manual Therapy Passive ROM;Soft tissue mobilization   Soft tissue mobilization R patellar mobs    Passive ROM PROM R knee into ext with  gentle holds at end range                PT Education - 09/21/14 1315    Education provided Yes   Education Details HEP   Person(s) Educated Patient   Methods Explanation;Demonstration;Handout   Comprehension Verbalized understanding;Returned demonstration          PT Short Term Goals - 09/03/14 1752    PT SHORT TERM GOAL #1   Title Ind with HEP.   Period Weeks   Status Achieved           PT Long Term Goals - 09/21/14 1327    PT LONG TERM GOAL #1   Title Active right knee extension to 0 degrees.   Time 6   Period Weeks   Status Not Met  AROM -8 degrees/ PROM -5 degrees   PT LONG TERM GOAL #2   Title Perform ADL's with right knee pain not > 3/10.   Time 6   Period Weeks   Status Achieved   PT LONG TERM GOAL #3   Title Sit 30 minutes then transition to standing with pain not > 3-4/10.   Time 6   Status Achieved               Plan  - 09/21/14 1316    Clinical Impression Statement Patient has continued to progress with all activities. Patient has met all goals except ext goal. AROM -8 degrees. FOTO 61% limitation (initial 58%) patient would like today to be last day and continue exersises and self ROM at home. Patient will be scheduling her knee replacement surgery soon.    Pt will benefit from skilled therapeutic intervention in order to improve on the following deficits Pain;Decreased activity tolerance;Decreased strength;Decreased range of motion   Rehab Potential Good   PT Frequency 2x / week   PT Duration 6 weeks   PT Treatment/Interventions Electrical Stimulation;Ultrasound;Moist Heat;Cryotherapy;Therapeutic exercise;Therapeutic activities;Neuromuscular re-education;Passive range of motion   PT Next Visit Plan DC per patient request   Consulted and Agree with Plan of Care Patient        Problem List Patient Active Problem List   Diagnosis Date Noted  . Benign paroxysmal positional vertigo 02/09/2014  . 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 Essential hypertension, benign 07/17/2012  . Hyperlipidemia with target LDL less than 100 07/17/2012  . Migraines 07/17/2012  . Hereditary and idiopathic peripheral neuropathy 07/17/2012  . Hypokalemia 07/17/2012   Cathie Hoops, PTA 09/21/2014 1:30 PM  Winnona Wargo P, PTA 09/21/2014, 1:30 PM  [facility] [address] [place], [place], [address] Phone: [phone]   Fax:  [phone]    PHYSICAL THERAPY DISCHARGE SUMMARY  Visits from Start of Care: 7  Current functional level related to goals / functional outcomes: See above   Remaining deficits: See above   Education / Equipment: HEP  Plan: Patient agrees to discharge.  Patient goals were partially met. Patient is being discharged due to the patient's request.  ?????  Patient would like to save her visits for her TKR in the near future.  Solon Palm,  PT 09/21/2014 2:56 PM [facility] [address] [place], [place], [address] Phone: [phone]   Fax:  [phone]

## 2014-09-25 ENCOUNTER — Ambulatory Visit (INDEPENDENT_AMBULATORY_CARE_PROVIDER_SITE_OTHER): Payer: Medicare Other | Admitting: Neurology

## 2014-09-25 ENCOUNTER — Encounter: Payer: Self-pay | Admitting: Neurology

## 2014-09-25 ENCOUNTER — Other Ambulatory Visit: Payer: Medicare Other

## 2014-09-25 VITALS — BP 120/86 | HR 108 | Ht 69.0 in | Wt 173.0 lb

## 2014-09-25 DIAGNOSIS — G609 Hereditary and idiopathic neuropathy, unspecified: Secondary | ICD-10-CM

## 2014-09-25 DIAGNOSIS — M79609 Pain in unspecified limb: Secondary | ICD-10-CM | POA: Diagnosis not present

## 2014-09-25 DIAGNOSIS — M79604 Pain in right leg: Secondary | ICD-10-CM

## 2014-09-25 LAB — TSH: TSH: 1.803 u[IU]/mL (ref 0.350–4.500)

## 2014-09-25 LAB — VITAMIN B12: Vitamin B-12: 555 pg/mL (ref 211–911)

## 2014-09-25 NOTE — Progress Notes (Signed)
[facility] Clinic Note - Initial Visit   Date: 09/25/2014   Brittany Mercer MRN: [id number] DOB: Jul 26, 1945   Dear Dr. Darlyn Read:  Thank you for your kind referral of Brittany Mercer for consultation of right leg pain. Although her history is well known to you, please allow [place] to reiterate it for the purpose of our medical record. The patient was accompanied to the clinic by self.    History of Present Illness: Brittany Mercer is a 69 y.o. right-handed Caucasian female with hypertension, hyperlipidemia, and peripheral neuropathy presenting for evaluation of neuropathy and right leg pain.  Starting around 2005, she started experiencing burning, increased sensitivity of her feet and was eventually diagnosed with peripheral neuropathy by Dr. Allene Dillon in [place], [place].  She reports being told she "has damage in her arms and legs and hands and feet".   She was recently evaluated by Dr. Anne Hahn at [facility] for neuropathy whose clinic notes have been reviewed.  Her last NCS/EMG performed at [facility] in August 2016 did not show any large fiber neuropathy and it was suspected that she most likely has small fiber neuropathy.  Patient was not satisfied with the diagnosis given and is here for a second opinion.     She is taking topamax 75mg  at bedtime and Nucynta (written by pain management, Dr. Duane Boston with [facility]).  Her pain well-controlled on these two medications.  Prior to taking these medications, she was always soaking her feet in ice cold water because burning sensation was severe.  She continues to have stabbing pain which is variable, sometimes occuring several times per day and other times not having them for several weeks.  She has occasional weakness and endorses several falls.  Nothing that alleviates or exacerbates this pain.   Since December, she started having pain in her right knee and entire right leg. Pain is "bone deep" and achy.  Pain is intermittent and  noticed that it is worse with sitting.   She was sent to physical therapy and orthopeadic surgery.  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 There is no sharp shooting pain and it does not feel like her neuropathy pain.     Out-side paper records, electronic medical record, and images have been reviewed where available and summarized as:   Lab Results  Component Value Date   TSH 1.730 04/08/2013   CT head 06/25/2013: Right frontal scalp hematoma. Areas of paranasal sinus disease. No intracranial mass, hemorrhage, or extra-axial fluid. No evidence suggesting acute infarct.  Past Medical History  Diagnosis Date  . Hypertension   . Hyperlipidemia   . Diverticulitis   . Peripheral neuropathy   . 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 Cesarean section      X2  . Tonsillectomy    . Knee surgery      right x2  . Shoulder surgery Left   . Abdominal hysterectomy      partial - 1 ovary removed/ 1 ovary remains     Medications:  Current Outpatient Prescriptions on File Prior to Visit  Medication Sig Dispense Refill  . atenolol (TENORMIN) 50 MG tablet Take 1 tablet (50 mg total) by mouth daily. 90 tablet 1  . escitalopram (LEXAPRO) 20 MG tablet Take 1 tablet (20 mg total) by mouth daily. 30 tablet 5  . ezetimibe (ZETIA) 10 MG tablet Take 1 tablet (10 mg total) by mouth daily. For cholesterol  90 tablet 3  . loratadine (CLARITIN) 10 MG tablet Take 10 mg by mouth daily as needed for allergies.    Marland Kitchen meclizine (ANTIVERT) 25 MG tablet Take 1 tablet (25 mg total) by mouth 3 (three) times daily as needed for dizziness. 90 tablet 2  . NUCYNTA 100 MG TABS Take 1 tablet by mouth as needed.     . potassium chloride (K-DUR,KLOR-CON) 10 MEQ tablet TAKE 2 TO 3 TABLETS DAILY AS DIRECTED 90 tablet 0  . topiramate (TOPAMAX) 25 MG tablet Take 75 mg by mouth at bedtime.     . triamterene-hydrochlorothiazide (MAXZIDE-25) 37.5-25 MG  per tablet Take 1 tablet by mouth daily. 90 tablet 1   No current facility-administered medications on file prior to visit.    Allergies:  Allergies  Allergen Reactions  . Penicillins   . Statins Other (See Comments)    Myalgia  . Sulfa Antibiotics     Family History: Family History  Problem Relation Age of Onset  . Hypertension Mother   . Hyperlipidemia Mother   . Stroke Mother   . Hypertension Father   . Dementia Father   . Neuropathy Father   . Hypertension Brother     Social History: History   Social History  . Marital Status: Married    Spouse Name: N/A  . Number of Children: 2  . Years of Education: college   Occupational History  . Retired   . [employer]    Social History Main Topics  . Smoking status: Former Smoker -- 0.50 packs/day for 25 years    Types: Cigarettes    Quit date: 07/18/1998  . Smokeless tobacco: Not on file  . Alcohol Use: 0.0 oz/week    0 Standard drinks or equivalent per week     Comment: rarely - once a month  . Drug Use: No  . Sexual Activity: Not on file   Other Topics Concern  . Not on file   Social History Narrative    Review of Systems:  CONSTITUTIONAL: No fevers, chills, night sweats, or weight loss.   EYES: No visual changes or eye pain ENT: No hearing changes.  No history of nose bleeds.   RESPIRATORY: No cough, wheezing and shortness of breath.   CARDIOVASCULAR: Negative for chest pain, and palpitations.   GI: Negative for abdominal discomfort, blood in stools or black stools.  No recent change in bowel habits.   GU:  No history of incontinence.   MUSCLOSKELETAL: +history of joint pain or swelling.  +myalgias.   SKIN: Negative for lesions, rash, and itching.   HEMATOLOGY/ONCOLOGY: Negative for prolonged bleeding, bruising easily, and swollen nodes.  No history of cancer.   ENDOCRINE: Negative for cold or heat intolerance, polydipsia or goiter.   PSYCH:  No depression +anxiety symptoms.   NEURO: As  Above.   Vital Signs:  BP 120/86 mmHg  Pulse 108  Ht 5\' 9"  (1.753 m)  Wt 173 lb (78.472 kg)  BMI 25.54 kg/m2   General Medical Exam:   General:  Well appearing, comfortable.   Eyes/ENT: see cranial nerve examination.   Neck: No masses appreciated.  Full range of motion without tenderness.  No carotid bruits. Respiratory:  Clear to auscultation, good air entry bilaterally.   Cardiac:  Regular rate and rhythm, no murmur.   Extremities:  No deformities, edema, or skin discoloration.  Skin:  No rashes or lesions.  Neurological Exam: MENTAL STATUS including orientation to time, place, person, recent and remote memory,  attention span and concentration, language, and fund of knowledge is normal.  Speech is not dysarthric.  CRANIAL NERVES: II:  No visual field defects.  Unremarkable fundi.   III-IV-VI: Pupils equal round and reactive to light.  Normal conjugate, extra-ocular eye movements in all directions of gaze.  No nystagmus.  No ptosis.   V:  Normal facial sensation.     VII:  Normal facial symmetry and movements VIII:  Normal hearing and vestibular function.   IX-X:  Normal palatal movement.   XI:  Normal shoulder shrug and head rotation.   XII:  Normal tongue strength and range of motion, no deviation or fasciculation.  MOTOR:  No atrophy, fasciculations or abnormal movements.  No pronator drift.  Tone is normal.    Right Upper Extremity:    Left Upper Extremity:    Deltoid  5/5   Deltoid  5/5   Biceps  5/5   Biceps  5/5   Triceps  5/5   Triceps  5/5   Wrist extensors  5/5   Wrist extensors  5/5   Wrist flexors  5/5   Wrist flexors  5/5   Finger extensors  5/5   Finger extensors  5/5   Finger flexors  5/5   Finger flexors  5/5   Dorsal interossei  5/5   Dorsal interossei  5/5   Abductor pollicis  5/5   Abductor pollicis  5/5   Tone (Ashworth scale)  0  Tone (Ashworth scale)  0   Right Lower Extremity:    Left Lower Extremity:    Hip flexors  5/5   Hip flexors  5/5   Hip  extensors  5/5   Hip extensors  5/5   Knee flexors  5/5   Knee flexors  5/5   Knee extensors  5/5   Knee extensors  5/5   Dorsiflexors  5/5   Dorsiflexors  5/5   Plantarflexors  5/5   Plantarflexors  5/5   Toe extensors  5/5   Toe extensors  5/5   Toe flexors  5/5   Toe flexors  5/5   Tone (Ashworth scale)  0  Tone (Ashworth scale)  0   MSRs:  Right                                                                 Left brachioradialis 2+  brachioradialis 2+  biceps 2+  biceps 2+  triceps 2+  triceps 2+  patellar 2+  patellar 2+  ankle jerk 1+  ankle jerk 1+  Hoffman no  Hoffman no  plantar response down  plantar response down   SENSORY: Reduced vibration and temperature distal to knees.  Pin prick intact. Romberg's sign absent.   COORDINATION/GAIT: Normal finger-to- nose-finger and heel-to-shin.  Intact rapid alternating movements bilaterally.  Able to rise from a chair without using arms.  Gait narrow based and stable. She is unable to perform tandem and stressed gait.    IMPRESSION: 1.  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  She has painful paresthesias and gait instability, but reports having adequate pain relief with her current regimen prescribed by her pain management provider which includes topamax 75mg  daily and Nucynta 100mg  daily.  She had multiple  questions regarding this diagnosis which I answered to the best of my ability.   She will follow-up with her pain management provider for pain control.   For completeness, I will check her vitamin B12, copper, and TSH levels, although she most likely had extensive serology testing during her initial diagnosis which returned normal.    2.  Right leg pain, described as achy and most likely orthopaedic in origin.  There is no radicular pain and it is not characterized by neuropathic features.  I suspect she may have referred pain from her right knee.  Return to clinic as needed   The duration of  this appointment visit was 40 minutes of face-to-face time with the patient.  Greater than 50% of this time was spent in counseling, explanation of diagnosis, planning of further management, and coordination of care.   Thank you for allowing me to participate in patient's care.  If I can answer any additional questions, I would be pleased to do so.    Sincerely,    Berkeley Vanaken K. Allena Katz, DO

## 2014-09-25 NOTE — Patient Instructions (Signed)
1.  Check blood work 2.  We will call you with the results

## 2014-09-29 LAB — COPPER, SERUM: Copper: 153 ug/dL (ref 70–175)

## 2014-10-20 ENCOUNTER — Telehealth: Payer: Self-pay | Admitting: Family Medicine

## 2014-10-20 MED ORDER — DULOXETINE HCL 30 MG PO CPEP: 30.0000 mg | ORAL_CAPSULE | Freq: Every day | ORAL | Status: DC

## 2014-10-20 NOTE — Telephone Encounter (Signed)
Pt notified of Dr Livia Snellen recommendation Verbalizes understanding

## 2014-10-20 NOTE — Telephone Encounter (Signed)
Pt called regarding Lexapro She stated she had just refilled med and was told to finish RX but to call when she finished so med could be changed Please advise

## 2014-10-20 NOTE — Telephone Encounter (Signed)
Cymbalta prescription sent. Follow-up 1 month.

## 2014-11-18 ENCOUNTER — Other Ambulatory Visit: Payer: Self-pay | Admitting: Family Medicine

## 2014-12-01 ENCOUNTER — Other Ambulatory Visit: Payer: Self-pay | Admitting: Nurse Practitioner

## 2014-12-09 DIAGNOSIS — M25561 Pain in right knee: Secondary | ICD-10-CM | POA: Diagnosis not present

## 2014-12-09 DIAGNOSIS — G8929 Other chronic pain: Secondary | ICD-10-CM | POA: Diagnosis not present

## 2014-12-09 DIAGNOSIS — M1711 Unilateral primary osteoarthritis, right knee: Secondary | ICD-10-CM | POA: Diagnosis not present

## 2014-12-14 DIAGNOSIS — L570 Actinic keratosis: Secondary | ICD-10-CM | POA: Diagnosis not present

## 2014-12-21 ENCOUNTER — Ambulatory Visit (INDEPENDENT_AMBULATORY_CARE_PROVIDER_SITE_OTHER): Payer: Medicare Other | Admitting: Family Medicine

## 2014-12-21 DIAGNOSIS — E876 Hypokalemia: Secondary | ICD-10-CM | POA: Diagnosis not present

## 2014-12-21 DIAGNOSIS — R42 Dizziness and giddiness: Secondary | ICD-10-CM | POA: Diagnosis not present

## 2014-12-21 DIAGNOSIS — E559 Vitamin D deficiency, unspecified: Secondary | ICD-10-CM

## 2014-12-21 DIAGNOSIS — Z780 Asymptomatic menopausal state: Secondary | ICD-10-CM | POA: Diagnosis not present

## 2014-12-21 DIAGNOSIS — Z23 Encounter for immunization: Secondary | ICD-10-CM

## 2014-12-21 DIAGNOSIS — F32A Depression, unspecified: Secondary | ICD-10-CM

## 2014-12-21 DIAGNOSIS — E785 Hyperlipidemia, unspecified: Secondary | ICD-10-CM | POA: Diagnosis not present

## 2014-12-21 DIAGNOSIS — F329 Major depressive disorder, single episode, unspecified: Secondary | ICD-10-CM | POA: Diagnosis not present

## 2014-12-21 DIAGNOSIS — I10 Essential (primary) hypertension: Secondary | ICD-10-CM | POA: Diagnosis not present

## 2014-12-21 DIAGNOSIS — M1711 Unilateral primary osteoarthritis, right knee: Secondary | ICD-10-CM | POA: Diagnosis not present

## 2014-12-21 NOTE — Patient Instructions (Addendum)
Take duloxetine every other day for 3 doses which is of course 6 days. Then you may discontinue the medicine. Monitor for returning signs of depression due to grief or other circumstances.

## 2014-12-21 NOTE — Progress Notes (Signed)
Subjective:  Patient ID: Brittany Mercer, female    DOB: Jul 27, 1945  Age: 69 y.o. MRN: [id number]  CC: Hypertension and Hyperlipidemia   HPI Judye Lorino presents for  follow-up of hypertension. Patient has no history of headache chest pain or shortness of breath or recent cough. Patient also denies symptoms of TIA such as numbness weakness lateralizing. Patient checks  blood pressure at home and has not had any elevated readings recently. Patient denies side effects from his medication. States taking it regularly.  Patient also  in for follow-up of elevated cholesterol. Doing well without complaints on current medication. Denies side effects of statin including myalgia and arthralgia and nausea. Also in today for liver function testing. Currently no chest pain, shortness of breath or other cardiovascular related symptoms noted.  Dizziness and to falls recently. Sharp pains in left ear. Hx of cerumen impaction. Symptoms are actually getting better. She's been using some eardrops to soften her wax at home. That seems to have decreased both the pain and the dizziness. Additionally she would like to discontinue the Cymbalta. She has only been taking 30 mg daily. She has a friend now to whom she can talk to work through her recent exacerbation of her grieving of her husband. She feels that she will not have issues with SA D or depression through the holidays.    History Farryn has a past medical history of Hypertension; Hyperlipidemia; Diverticulitis; Peripheral neuropathy; and Somnambulism (10/27/2013).   She has past surgical history that includes Cesarean section; Tonsillectomy; Knee surgery; Shoulder surgery (Left); and Abdominal hysterectomy.   Her family history includes Dementia in her father; Hyperlipidemia in her mother; Hypertension in her brother, father, and mother; Neuropathy in her father; Stroke in her mother.She reports that she quit smoking about 16 years ago. Her smoking use  included Cigarettes. She has a 12.5 pack-year smoking history. She does not have any smokeless tobacco history on file. She reports that she drinks alcohol. She reports that she does not use illicit drugs.  Current Outpatient Prescriptions on File Prior to Visit  Medication Sig Dispense Refill  . atenolol (TENORMIN) 50 MG tablet Take 1 tablet (50 mg total) by mouth daily. 90 tablet 0  . cyclobenzaprine (FLEXERIL) 5 MG tablet Take 5 mg by mouth as needed.     . DULoxetine (CYMBALTA) 30 MG capsule Take 1 cap by mouth daily. For 1 wk then 2 dai ly. Take with a full stomach at suppertime 60 capsule 0  . ezetimibe (ZETIA) 10 MG tablet Take 1 tablet (10 mg total) by mouth daily. For cholesterol 90 tablet 3  . loratadine (CLARITIN) 10 MG tablet Take 10 mg by mouth daily as needed for allergies.    Marland Kitchen meclizine (ANTIVERT) 25 MG tablet Take 1 tablet (25 mg total) by mouth 3 (three) times daily as needed for dizziness. 90 tablet 2  . NUCYNTA 100 MG TABS Take 1 tablet by mouth as needed.     . potassium chloride (K-DUR) 10 MEQ tablet TAKE 2 TO 3 TABLETS DAILY AS DIRECTED 90 tablet 0  . topiramate (TOPAMAX) 25 MG tablet Take 75 mg by mouth at bedtime.     . triamterene-hydrochlorothiazide (MAXZIDE-25) 37.5-25 MG per tablet Take 1 tablet by mouth daily. 90 tablet 0   No current facility-administered medications on file prior to visit.    ROS Review of Systems  Constitutional: Negative for fever, chills, diaphoresis, appetite change, fatigue and unexpected weight change.  HENT: Negative for congestion, ear pain,  hearing loss, postnasal drip, rhinorrhea, sneezing, sore throat and trouble swallowing.   Eyes: Negative for pain.  Respiratory: Negative for cough, chest tightness and shortness of breath.   Cardiovascular: Negative for chest pain and palpitations.  Gastrointestinal: Negative for nausea, vomiting, abdominal pain, diarrhea and constipation.  Genitourinary: Negative for dysuria, frequency and  menstrual problem.  Musculoskeletal: Negative for joint swelling and arthralgias.  Skin: Negative for rash.  Neurological: Negative for dizziness, weakness, numbness and headaches.  Psychiatric/Behavioral: Negative for dysphoric mood and agitation.    Objective:  There were no vitals taken for this visit.  BP Readings from Last 3 Encounters:  09/25/14 120/86  09/18/14 108/64  08/07/14 92/61    Wt Readings from Last 3 Encounters:  09/25/14 173 lb (78.472 kg)  09/18/14 173 lb 6.4 oz (78.654 kg)  08/07/14 174 lb (78.926 kg)     Physical Exam  Constitutional: She is oriented to person, place, and time. She appears well-developed and well-nourished. No distress.  HENT:  Head: Normocephalic and atraumatic.  Right Ear: External ear normal.  Left Ear: External ear normal.  Nose: Nose normal.  Mouth/Throat: Oropharynx is clear and moist.  Eyes: Conjunctivae and EOM are normal. Pupils are equal, round, and reactive to light.  Neck: Normal range of motion. Neck supple. No thyromegaly present.  Cardiovascular: Normal rate, regular rhythm and normal heart sounds.   No murmur heard. Pulmonary/Chest: Effort normal and breath sounds normal. No respiratory distress. She has no wheezes. She has no rales.  Abdominal: Soft. Bowel sounds are normal. She exhibits no distension. There is no tenderness.  Lymphadenopathy:    She has no cervical adenopathy.  Neurological: She is alert and oriented to person, place, and time. She has normal reflexes.  Skin: Skin is warm and dry.  Psychiatric: She has a normal mood and affect. Her behavior is normal. Judgment and thought content normal.    No results found for: HGBA1C  Lab Results  Component Value Date   WBC 6.5 04/08/2013   HGB 13.8 04/08/2013   HCT 43.2 04/08/2013   GLUCOSE 72 06/08/2014   CHOL 217* 06/08/2014   TRIG 308* 06/08/2014   HDL 31* 06/08/2014   LDLCALC 100* 10/07/2013   ALT 18 06/08/2014   AST 13 06/08/2014   NA 141  06/08/2014   K 3.7 06/08/2014   CL 97 06/08/2014   CREATININE 0.89 06/08/2014   BUN 22 06/08/2014   CO2 29 06/08/2014   TSH 1.803 09/25/2014    Ct Head Wo Contrast  06/25/2013   CLINICAL DATA:  Pain post trauma  EXAM: CT HEAD WITHOUT CONTRAST  TECHNIQUE: Contiguous axial images were obtained from the base of the skull through the vertex without intravenous contrast.  COMPARISON:  None.  FINDINGS: The ventricles are normal in size and configuration. There is no mass, hemorrhage, extra-axial fluid collection, or midline shift. Gray-white compartments appear normal. There is no demonstrable acute infarct.  Minimal basal ganglia calcification is felt to be physiologic. Bony calvarium appears intact. The mastoid air cells are clear. There is a right frontal scalp hematoma. There is minimal mucosal thickening in the left maxillary antrum. There is patchy sinus disease in the left sphenoid sinus.  IMPRESSION: Right frontal scalp hematoma. Areas of paranasal sinus disease. No intracranial mass, hemorrhage, or extra-axial fluid. No evidence suggesting acute infarct.   Electronically Signed   By: Lowella Grip M.D.   On: 06/25/2013 12:30    Assessment & Plan:   Isatou was seen today  for hypertension and hyperlipidemia.  Diagnoses and all orders for this visit:  Essential hypertension, benign -     CBC with Differential/Platelet -     CMP14+EGFR  Hyperlipidemia with target LDL less than 100 -     CMP14+EGFR -     Lipid panel  Hypokalemia -     CMP14+EGFR  Vitamin D deficiency -     Vit D  25 hydroxy (rtn osteoporosis monitoring)  Postmenopausal -     Vit D  25 hydroxy (rtn osteoporosis monitoring)  Encounter for immunization  Dizziness and giddiness  Depression  Other orders -     Flu Vaccine QUAD 36+ mos IM   I am having Ms. Rainford maintain her NUCYNTA, loratadine, meclizine, topiramate, ezetimibe, cyclobenzaprine, DULoxetine, potassium chloride, atenolol,  triamterene-hydrochlorothiazide, and ibuprofen.  Meds ordered this encounter  Medications  . ibuprofen (ADVIL,MOTRIN) 200 MG tablet    Sig: Take 200 mg by mouth every 6 (six) hours as needed.   Take duloxetine every other day for 3 doses which is of course 6 days. Then you may discontinue the medicine. Monitor for returning signs of depression due to grief or other circumstances.  Follow-up: Return in about 4 months (around 04/22/2015) for cholesterol, hypertension, Pain, neuropdizzinessrhyy and .  Claretta Fraise, M.D.

## 2014-12-30 DIAGNOSIS — M1990 Unspecified osteoarthritis, unspecified site: Secondary | ICD-10-CM | POA: Diagnosis not present

## 2014-12-30 DIAGNOSIS — M25561 Pain in right knee: Secondary | ICD-10-CM | POA: Diagnosis not present

## 2014-12-30 DIAGNOSIS — G609 Hereditary and idiopathic neuropathy, unspecified: Secondary | ICD-10-CM | POA: Diagnosis not present

## 2014-12-30 DIAGNOSIS — M47816 Spondylosis without myelopathy or radiculopathy, lumbar region: Secondary | ICD-10-CM | POA: Diagnosis not present

## 2015-01-14 NOTE — H&P (Signed)
TOTAL KNEE ADMISSION H&P  Patient is being admitted for right total knee arthroplasty.  Subjective:  Chief Complaint:     Right knee primary OA / pain  HPI: Brittany Mercer, 69 y.o. female, has a history of pain and functional disability in the right knee due to arthritis and has failed non-surgical conservative treatments for greater than 12 weeks to includeNSAID's and/or analgesics, corticosteriod injections, use of assistive devices and activity modification.  Onset of symptoms was gradual, starting 1+ years ago with gradually worsening course since that time. The patient noted prior procedures on the knee to include  arthroscopy on the right knee(s).  Patient currently rates pain in the right knee(s) at 6 out of 10 with activity. Patient has night pain, worsening of pain with activity and weight bearing, pain that interferes with activities of daily living, pain with passive range of motion, crepitus and joint swelling.  Patient has evidence of periarticular osteophytes and joint space narrowing by imaging studies.  There is no active infection.  Risks, benefits and expectations were discussed with the patient.  Risks including but not limited to the risk of anesthesia, blood clots, nerve damage, blood vessel damage, failure of the prosthesis, infection and up to and including death.  Patient understand the risks, benefits and expectations and wishes to proceed with surgery.   PCP: Claretta Fraise, MD  D/C Plans:      Home with HHPT  Post-op Meds:       No Rx given  Tranexamic Acid:      To be given - IV  Decadron:      Is to be given  FYI:     ASA post-op  Continue with home analgesic meds  No DME - HHPT with [facility]    Patient Active Problem List   Diagnosis Date Noted  . Benign paroxysmal positional vertigo 02/09/2014  . 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 Essential hypertension, benign 07/17/2012  . Hyperlipidemia with target LDL less than 100 07/17/2012  . Migraines 07/17/2012  .  Hereditary and idiopathic peripheral neuropathy 07/17/2012  . Hypokalemia 07/17/2012   Past Medical History  Diagnosis Date  . Hypertension   . Hyperlipidemia   . Diverticulitis   . Peripheral neuropathy   . 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 Cesarean section      X2  . Tonsillectomy    . Knee surgery      right x2  . Shoulder surgery Left   . Abdominal hysterectomy      partial - 1 ovary removed/ 1 ovary remains    No prescriptions prior to admission   Allergies  Allergen Reactions  . Statins Other (See Comments)    Myalgia  . Penicillins Rash    Pt not sure if she is allergic to penicillin or sulfa. So she does not take either one.  Has patient had a PCN reaction causing immediate rash, facial/tongue/throat swelling, SOB or lightheadedness with hypotension: No Has patient had a PCN reaction causing severe rash involving mucus membranes or skin necrosis: Yes rash on arms.  Has patient had a PCN reaction that required hospitalization No Has patient had a PCN reaction occurring within the last 10 years:No If all of the above answers are "NO",  . Sulfa Antibiotics Rash    Pt not sure if she is allergic to sulfa or penicillin so she doesn't take either one.     Social History  Substance Use Topics  . Smoking status:  Former Smoker -- 0.50 packs/day for 25 years    Types: Cigarettes    Quit date: 07/18/1998  . Smokeless tobacco: Not on file  . Alcohol Use: 0.0 oz/week    0 Standard drinks or equivalent per week     Comment: rarely - once a month    Family History  Problem Relation Age of Onset  . Hypertension Mother   . Hyperlipidemia Mother   . Stroke Mother   . Hypertension Father   . Dementia Father   . Neuropathy Father   . Hypertension Brother      Review of Systems  Constitutional: Negative.   Eyes: Negative.   Respiratory: Negative.   Cardiovascular: Negative.   Gastrointestinal: Negative.   Genitourinary:  Negative.   Musculoskeletal: Positive for joint pain.  Skin: Negative.   Neurological: Positive for headaches.  Endo/Heme/Allergies: Negative.   Psychiatric/Behavioral: Negative.     Objective:  Physical Exam  Constitutional: She is oriented to person, place, and time. She appears well-developed.  HENT:  Head: Normocephalic.  Eyes: Pupils are equal, round, and reactive to light.  Neck: Neck supple. No JVD present. No tracheal deviation present. No thyromegaly present.  Cardiovascular: Normal rate, regular rhythm, normal heart sounds and intact distal pulses.   Respiratory: Effort normal and breath sounds normal. No stridor. No respiratory distress. She has no wheezes.  GI: Soft. There is no tenderness. There is no guarding.  Musculoskeletal:       Right knee: She exhibits decreased range of motion, swelling and bony tenderness. She exhibits no ecchymosis, no deformity, no laceration and no erythema. Tenderness found.  Lymphadenopathy:    She has no cervical adenopathy.  Neurological: She is alert and oriented to person, place, and time.  Skin: Skin is warm and dry.  Psychiatric: She has a normal mood and affect.      Labs:  Estimated body mass index is 25.54 kg/(m^2) as calculated from the following:   Height as of 09/25/14: 5\' 9"  (1.753 m).   Weight as of 09/25/14: 78.472 kg (173 lb).   Imaging Review Plain radiographs demonstrate severe degenerative joint disease of the right knee(s).  The bone quality appears to be good for age and reported activity level.  Assessment/Plan:  End stage arthritis, right knee   The patient history, physical examination, clinical judgment of the provider and imaging studies are consistent with end stage degenerative joint disease of the right knee(s) and total knee arthroplasty is deemed medically necessary. The treatment options including medical management, injection therapy arthroscopy and arthroplasty were discussed at length. The risks and  benefits of total knee arthroplasty were presented and reviewed. The risks due to aseptic loosening, infection, stiffness, patella tracking problems, thromboembolic complications and other imponderables were discussed. The patient acknowledged the explanation, agreed to proceed with the plan and consent was signed. Patient is being admitted for inpatient treatment for surgery, pain control, PT, OT, prophylactic antibiotics, VTE prophylaxis, progressive ambulation and ADL's and discharge planning. The patient is planning to be discharged home with home health services.      [place] Keianna Signer   PA-C  01/14/2015, 12:43 PM

## 2015-01-16 ENCOUNTER — Encounter (HOSPITAL_COMMUNITY): Payer: Self-pay | Admitting: Family Medicine

## 2015-01-18 ENCOUNTER — Other Ambulatory Visit: Payer: Self-pay

## 2015-01-18 ENCOUNTER — Encounter (HOSPITAL_COMMUNITY): Payer: Self-pay

## 2015-01-18 ENCOUNTER — Other Ambulatory Visit (HOSPITAL_COMMUNITY): Payer: Medicare Other

## 2015-01-18 ENCOUNTER — Encounter (HOSPITAL_COMMUNITY)
Admission: RE | Admit: 2015-01-18 | Discharge: 2015-01-18 | Disposition: A | Discharge status: Home / Self Care | Payer: Medicare Other | Source: Ambulatory Visit | Attending: Orthopedic Surgery | Admitting: Orthopedic Surgery

## 2015-01-18 DIAGNOSIS — M179 Osteoarthritis of knee, unspecified: Secondary | ICD-10-CM | POA: Insufficient documentation

## 2015-01-18 DIAGNOSIS — Z01818 Encounter for other preprocedural examination: Secondary | ICD-10-CM | POA: Diagnosis not present

## 2015-01-18 HISTORY — DX: Headache: R51

## 2015-01-18 HISTORY — DX: Headache, unspecified: R51.9

## 2015-01-18 LAB — BASIC METABOLIC PANEL
Anion gap: 7 (ref 5–15)
BUN: 28 mg/dL — ABNORMAL HIGH (ref 6–20)
CO2: 28 mmol/L (ref 22–32)
Calcium: 10 mg/dL (ref 8.9–10.3)
Chloride: 107 mmol/L (ref 101–111)
Creatinine, Ser: 0.88 mg/dL (ref 0.44–1.00)
GFR calc Af Amer: >60 mL/min (ref >60)
GFR calc non Af Amer: >60 mL/min (ref >60)
Glucose, Bld: 113 mg/dL — ABNORMAL HIGH (ref 65–99)
Potassium: 3.4 mmol/L — ABNORMAL LOW (ref 3.5–5.1)
Sodium: 142 mmol/L (ref 135–145)

## 2015-01-18 LAB — URINALYSIS, ROUTINE W REFLEX MICROSCOPIC
Bilirubin Urine: NEGATIVE
Glucose, UA: NEGATIVE mg/dL
Hgb urine dipstick: NEGATIVE
Ketones, ur: NEGATIVE mg/dL
Leukocytes, UA: NEGATIVE
Nitrite: NEGATIVE
Protein, ur: NEGATIVE mg/dL
Specific Gravity, Urine: 1.019 (ref 1.005–1.030)
Urobilinogen, UA: 1 mg/dL (ref 0.0–1.0)
pH: 6 (ref 5.0–8.0)

## 2015-01-18 LAB — CBC
HCT: 42.6 % (ref 36.0–46.0)
Hemoglobin: 15.1 g/dL — ABNORMAL HIGH (ref 12.0–15.0)
MCH: 31.7 pg (ref 26.0–34.0)
MCHC: 35.4 g/dL (ref 30.0–36.0)
MCV: 89.3 fL (ref 78.0–100.0)
Platelets: 187 10*3/uL (ref 150–400)
RBC: 4.77 MIL/uL (ref 3.87–5.11)
RDW: 13.4 % (ref 11.5–15.5)
WBC: 7.7 10*3/uL (ref 4.0–10.5)

## 2015-01-18 LAB — SURGICAL PCR SCREEN
MRSA, PCR: NEGATIVE
Staphylococcus aureus: NEGATIVE

## 2015-01-18 LAB — APTT: aPTT: 28 seconds (ref 24–37)

## 2015-01-18 LAB — PROTIME-INR
INR: 1.02 (ref 0.00–1.49)
Prothrombin Time: 13.6 seconds (ref 11.6–15.2)

## 2015-01-18 LAB — ABO/RH: ABO/RH(D): B POS

## 2015-01-18 NOTE — Patient Instructions (Addendum)
YOUR PROCEDURE IS SCHEDULED ON : 01/25/15  REPORT TO [facility] MA[facility] ENTRANCE FOLLOW SIGNS TO EAST ELEVATOR - GO TO 3rd FLOOR CHECK IN AT 3 EAST NURSES STATION (SHORT STAY) AT:  10:00 AM  CALL THIS NUMBER IF YOU HAVE PROBLEMS THE MORNING OF SURGERY [phone]  REMEMBER:ONLY 1 PER PERSON MAY GO TO SHORT STAY WITH YOU TO GET READY THE MORNING OF YOUR SURGERY  DO NOT EAT FOOD OR DRINK LIQUIDS AFTER MIDNIGHT MAY HAVE WATER UNTIL 6:00 AM  TAKE THESE MEDICINES THE MORNING OF SURGERY: CLARITIN / TOPAMAX / MAY TAKE NUCYNTA IF NEEDED  YOU MAY NOT HAVE ANY METAL ON YOUR BODY INCLUDING HAIR PINS AND PIERCING'S. DO NOT WEAR JEWELRY, MAKEUP, LOTIONS, POWDERS OR PERFUMES. DO NOT WEAR NAIL POLISH. DO NOT SHAVE 48 HRS PRIOR TO SURGERY. MEN MAY SHAVE FACE AND NECK.  DO NOT [place] IS NOT RESPONSIBLE FOR VALUABLES.  CONTACTS, DENTURES OR PARTIALS MAY NOT BE WORN TO SURGERY. LEAVE SUITCASE IN CAR. CAN BE BROUGHT TO ROOM AFTER SURGERY.  PATIENTS DISCHARGED THE DAY OF SURGERY WILL NOT BE ALLOWED TO DRIVE HOME.  PLEASE READ OVER THE FOLLOWING INSTRUCTION SHEETS _________________________________________________________________________________                                          [place] - PREPARING FOR SURGERY  Before surgery, you can play an important role.  Because skin is not sterile, your skin needs to be as free of germs as possible.  You can reduce the number of germs on your skin by washing with CHG (chlorahexidine gluconate) soap before surgery.  CHG is an antiseptic cleaner which kills germs and bonds with the skin to continue killing germs even after washing. Please DO NOT use if you have an allergy to CHG or antibacterial soaps.  If your skin becomes reddened/irritated stop using the CHG and inform your nurse when you arrive at Short Stay. Do not shave (including legs and underarms) for at least 48 hours prior to the first CHG  shower.  You may shave your face. Please follow these instructions carefully:   1.  Shower with CHG Soap the night before surgery and the  morning of Surgery.   2.  If you choose to wash your hair, wash your hair first as usual with your  normal  Shampoo.   3.  After you shampoo, rinse your hair and body thoroughly to remove the  shampoo.                                         4.  Use CHG as you would any other liquid soap.  You can apply chg directly  to the skin and wash . Gently wash with scrungie or clean wascloth    5.  Apply the CHG Soap to your body ONLY FROM THE NECK DOWN.   Do not use on open                           Wound or open sores. Avoid contact with eyes, ears mouth and genitals (private parts).  Genitals (private parts) with your normal soap.              6.  Wash thoroughly, paying special attention to the area where your surgery  will be performed.   7.  Thoroughly rinse your body with warm water from the neck down.   8.  DO NOT shower/wash with your normal soap after using and rinsing off  the CHG Soap .                9.  Pat yourself dry with a clean towel.             10.  Wear clean night clothes to bed after shower             11.  Place clean sheets on your bed the night of your first shower and do not  sleep with pets.  Day of Surgery : Do not apply any lotions/deodorants the morning of surgery.  Please wear clean clothes to the hospital/surgery center.  FAILURE TO FOLLOW THESE INSTRUCTIONS MAY RESULT IN THE CANCELLATION OF YOUR SURGERY    PATIENT SIGNATURE_________________________________  ______________________________________________________________________     Adam Phenix  An incentive spirometer is a tool that can help keep your lungs clear and active. This tool measures how well you are filling your lungs with each breath. Taking long deep breaths may help reverse or decrease the chance of developing  breathing (pulmonary) problems (especially infection) following:  A long period of time when you are unable to move or be active. BEFORE THE PROCEDURE   If the spirometer includes an indicator to show your best effort, your nurse or respiratory therapist will set it to a desired goal.  If possible, sit up straight or lean slightly forward. Try not to slouch.  Hold the incentive spirometer in an upright position. INSTRUCTIONS FOR USE   Sit on the edge of your bed if possible, or sit up as far as you can in bed or on a chair.  Hold the incentive spirometer in an upright position.  Breathe out normally.  Place the mouthpiece in your mouth and seal your lips tightly around it.  Breathe in slowly and as deeply as possible, raising the piston or the ball toward the top of the column.  Hold your breath for 3-5 seconds or for as long as possible. Allow the piston or ball to fall to the bottom of the column.  Remove the mouthpiece from your mouth and breathe out normally.  Rest for a few seconds and repeat Steps 1 through 7 at least 10 times every 1-2 hours when you are awake. Take your time and take a few normal breaths between deep breaths.  The spirometer may include an indicator to show your best effort. Use the indicator as a goal to work toward during each repetition.  After each set of 10 deep breaths, practice coughing to be sure your lungs are clear. If you have an incision (the cut made at the time of surgery), support your incision when coughing by placing a pillow or rolled up towels firmly against it. Once you are able to get out of bed, walk around indoors and cough well. You may stop using the incentive spirometer when instructed by your caregiver.  RISKS AND COMPLICATIONS  Take your time so you do not get dizzy or light-headed.  If you are in pain, you may need to take or ask for pain medication before doing incentive spirometry. It is  harder to take a deep breath if you  are having pain. AFTER USE  Rest and breathe slowly and easily.  It can be helpful to keep track of a log of your progress. Your caregiver can provide you with a simple table to help with this. If you are using the spirometer at home, follow these instructions: [place] IF:   You are having difficultly using the spirometer.  You have trouble using the spirometer as often as instructed.  Your pain medication is not giving enough relief while using the spirometer.  You develop fever of 100.5 F (38.1 C) or higher. SEEK IMMEDIATE MEDICAL CARE IF:   You cough up bloody sputum that had not been present before.  You develop fever of 102 F (38.9 C) or greater.  You develop worsening pain at or near the incision site. MAKE SURE YOU:   Understand these instructions.  Will watch your condition.  Will get help right away if you are not doing well or get worse. Document Released: 07/24/2006 Document Revised: 06/05/2011 Document Reviewed: 09/24/2006 Exit[employer] Patient Information 2014 [employer], [place].   ________________________________________________________________________  WHAT IS A BLOOD TRANSFUSION? Blood Transfusion Information  A transfusion is the replacement of blood or some of its parts. Blood is made up of multiple cells which provide different functions.  Red blood cells carry oxygen and are used for blood loss replacement.  White blood cells fight against infection.  Platelets control bleeding.  Plasma helps clot blood.  Other blood products are available for specialized needs, such as hemophilia or other clotting disorders. BEFORE THE TRANSFUSION  Who gives blood for transfusions?   Healthy volunteers who are fully evaluated to make sure their blood is safe. This is blood bank blood. Transfusion therapy is the safest it has ever been in the practice of medicine. Before blood is taken from a donor, a complete history is taken to make sure that person has  no history of diseases nor engages in risky social behavior (examples are intravenous drug use or sexual activity with multiple partners). The donor's travel history is screened to minimize risk of transmitting infections, such as malaria. The donated blood is tested for signs of infectious diseases, such as HIV and hepatitis. The blood is then tested to be sure it is compatible with you in order to minimize the chance of a transfusion reaction. If you or a relative donates blood, this is often done in anticipation of surgery and is not appropriate for emergency situations. It takes many days to process the donated blood. RISKS AND COMPLICATIONS Although transfusion therapy is very safe and saves many lives, the main dangers of transfusion include:   Getting an infectious disease.  Developing a transfusion reaction. This is an allergic reaction to something in the blood you were given. Every precaution is taken to prevent this. The decision to have a blood transfusion has been considered carefully by your caregiver before blood is given. Blood is not given unless the benefits outweigh the risks. AFTER THE TRANSFUSION  Right after receiving a blood transfusion, you will usually feel much better and more energetic. This is especially true if your red blood cells have gotten low (anemic). The transfusion raises the level of the red blood cells which carry oxygen, and this usually causes an energy increase.  The nurse administering the transfusion will monitor you carefully for complications. HOME CARE INSTRUCTIONS  No special instructions are needed after a transfusion. You may find your energy is better. Speak  with your caregiver about any limitations on activity for underlying diseases you may have. SEEK MEDICAL CARE IF:   Your condition is not improving after your transfusion.  You develop redness or irritation at the intravenous (IV) site. SEEK IMMEDIATE MEDICAL CARE IF:  Any of the following  symptoms occur over the next 12 hours:  Shaking chills.  You have a temperature by mouth above 102 F (38.9 C), not controlled by medicine.  Chest, back, or muscle pain.  People around you feel you are not acting correctly or are confused.  Shortness of breath or difficulty breathing.  Dizziness and fainting.  You get a rash or develop hives.  You have a decrease in urine output.  Your urine turns a dark color or changes to pink, red, or brown. Any of the following symptoms occur over the next 10 days:  You have a temperature by mouth above 102 F (38.9 C), not controlled by medicine.  Shortness of breath.  Weakness after normal activity.  The white part of the eye turns yellow (jaundice).  You have a decrease in the amount of urine or are urinating less often.  Your urine turns a dark color or changes to pink, red, or brown. Document Released: 03/10/2000 Document Revised: 06/05/2011 Document Reviewed: 10/28/2007 [facility] Patient Information 2014 [place], [place].  _______________________________________________________________________

## 2015-01-22 ENCOUNTER — Other Ambulatory Visit: Payer: Self-pay | Admitting: Family Medicine

## 2015-01-25 ENCOUNTER — Inpatient Hospital Stay (HOSPITAL_COMMUNITY): Payer: Medicare Other | Admitting: Anesthesiology

## 2015-01-25 ENCOUNTER — Inpatient Hospital Stay (HOSPITAL_COMMUNITY)
Admission: AD | Admit: 2015-01-25 | Discharge: 2015-01-26 | DRG: 470 | Disposition: A | Discharge status: Home Health | Payer: Medicare Other | Source: Ambulatory Visit | Attending: Orthopedic Surgery | Admitting: Orthopedic Surgery

## 2015-01-25 ENCOUNTER — Encounter (HOSPITAL_COMMUNITY)
Admission: AD | Disposition: A | Discharge status: Home Health | Payer: Self-pay | Source: Ambulatory Visit | Attending: Orthopedic Surgery

## 2015-01-25 ENCOUNTER — Encounter (HOSPITAL_COMMUNITY): Payer: Self-pay | Admitting: *Deleted

## 2015-01-25 DIAGNOSIS — M1711 Unilateral primary osteoarthritis, right knee: Secondary | ICD-10-CM | POA: Diagnosis not present

## 2015-01-25 DIAGNOSIS — M659 Synovitis and tenosynovitis, unspecified: Secondary | ICD-10-CM | POA: Diagnosis present

## 2015-01-25 DIAGNOSIS — I10 Essential (primary) hypertension: Secondary | ICD-10-CM | POA: Diagnosis present

## 2015-01-25 DIAGNOSIS — Z96651 Presence of right artificial knee joint: Secondary | ICD-10-CM

## 2015-01-25 DIAGNOSIS — Z87891 Personal history of nicotine dependence: Secondary | ICD-10-CM

## 2015-01-25 DIAGNOSIS — M25561 Pain in right knee: Secondary | ICD-10-CM | POA: Diagnosis not present

## 2015-01-25 DIAGNOSIS — E785 Hyperlipidemia, unspecified: Secondary | ICD-10-CM | POA: Diagnosis present

## 2015-01-25 DIAGNOSIS — M179 Osteoarthritis of knee, unspecified: Secondary | ICD-10-CM | POA: Diagnosis not present

## 2015-01-25 DIAGNOSIS — Z96659 Presence of unspecified artificial knee joint: Secondary | ICD-10-CM

## 2015-01-25 HISTORY — PX: TOTAL KNEE ARTHROPLASTY: SHX125

## 2015-01-25 LAB — TYPE AND SCREEN
ABO/RH(D): B POS
Antibody Screen: NEGATIVE

## 2015-01-25 SURGERY — ARTHROPLASTY, KNEE, TOTAL
Anesthesia: Spinal | Site: Knee | Laterality: Right

## 2015-01-25 MED ORDER — DEXAMETHASONE SODIUM PHOSPHATE 10 MG/ML IJ SOLN
10.0000 mg | Freq: Once | INTRAMUSCULAR | Status: DC
  Filled 2015-01-25: qty 1

## 2015-01-25 MED ORDER — CEFAZOLIN SODIUM-DEXTROSE 2-3 GM-% IV SOLR
2.0000 g | INTRAVENOUS | Status: AC
  Administered 2015-01-25: 2 g via INTRAVENOUS

## 2015-01-25 MED ORDER — 0.9 % SODIUM CHLORIDE (POUR BTL) OPTIME
TOPICAL | Status: DC | PRN
  Administered 2015-01-25: 1000 mL

## 2015-01-25 MED ORDER — PROPOFOL 10 MG/ML IV BOLUS
INTRAVENOUS | Status: AC
  Filled 2015-01-25: qty 20

## 2015-01-25 MED ORDER — MECLIZINE HCL 25 MG PO TABS
25.0000 mg | ORAL_TABLET | Freq: Three times a day (TID) | ORAL | Status: DC | PRN
  Filled 2015-01-25: qty 1

## 2015-01-25 MED ORDER — DOCUSATE SODIUM 100 MG PO CAPS
100.0000 mg | ORAL_CAPSULE | Freq: Two times a day (BID) | ORAL | Status: DC
  Administered 2015-01-25 – 2015-01-26 (×2): 100 mg via ORAL

## 2015-01-25 MED ORDER — SODIUM CHLORIDE 0.9 % IR SOLN
Status: DC | PRN
  Administered 2015-01-25: 1000 mL

## 2015-01-25 MED ORDER — KETOROLAC TROMETHAMINE 30 MG/ML IJ SOLN
INTRAMUSCULAR | Status: DC | PRN
  Administered 2015-01-25: 30 mg

## 2015-01-25 MED ORDER — PHENOL 1.4 % MT LIQD
1.0000 | OROMUCOSAL | Status: DC | PRN
  Filled 2015-01-25: qty 177

## 2015-01-25 MED ORDER — TOPIRAMATE 25 MG PO TABS
75.0000 mg | ORAL_TABLET | Freq: Every day | ORAL | Status: DC
  Filled 2015-01-25: qty 3

## 2015-01-25 MED ORDER — MIDAZOLAM HCL 2 MG/2ML IJ SOLN
INTRAMUSCULAR | Status: AC
  Filled 2015-01-25: qty 4

## 2015-01-25 MED ORDER — MENTHOL 3 MG MT LOZG: 1.0000 | LOZENGE | OROMUCOSAL | Status: DC | PRN

## 2015-01-25 MED ORDER — KETOROLAC TROMETHAMINE 30 MG/ML IJ SOLN
INTRAMUSCULAR | Status: AC
  Filled 2015-01-25: qty 1

## 2015-01-25 MED ORDER — POTASSIUM CHLORIDE ER 10 MEQ PO TBCR
10.0000 meq | EXTENDED_RELEASE_TABLET | Freq: Two times a day (BID) | ORAL | Status: DC
  Administered 2015-01-26: 10 meq via ORAL
  Filled 2015-01-25 (×2): qty 1

## 2015-01-25 MED ORDER — CEFAZOLIN SODIUM-DEXTROSE 2-3 GM-% IV SOLR
INTRAVENOUS | Status: AC
  Filled 2015-01-25: qty 50

## 2015-01-25 MED ORDER — MAGNESIUM CITRATE PO SOLN: 1.0000 | Freq: Once | ORAL | Status: DC | PRN

## 2015-01-25 MED ORDER — HYDROMORPHONE HCL 1 MG/ML IJ SOLN
0.5000 mg | INTRAMUSCULAR | Status: DC | PRN
  Administered 2015-01-25: 1 mg via INTRAVENOUS
  Administered 2015-01-25: 0.5 mg via INTRAVENOUS
  Administered 2015-01-25: 1 mg via INTRAVENOUS
  Filled 2015-01-25 (×3): qty 1

## 2015-01-25 MED ORDER — KETOROLAC TROMETHAMINE 15 MG/ML IJ SOLN
15.0000 mg | Freq: Four times a day (QID) | INTRAMUSCULAR | Status: DC
  Administered 2015-01-25 – 2015-01-26 (×3): 15 mg via INTRAVENOUS
  Filled 2015-01-25 (×7): qty 1

## 2015-01-25 MED ORDER — POLYETHYLENE GLYCOL 3350 17 G PO PACK
17.0000 g | PACK | Freq: Two times a day (BID) | ORAL | Status: DC
  Administered 2015-01-25: 17 g via ORAL

## 2015-01-25 MED ORDER — BUPIVACAINE IN DEXTROSE 0.75-8.25 % IT SOLN
INTRATHECAL | Status: DC | PRN
Start: 2015-01-25 — End: 2015-01-25
  Administered 2015-01-25: 2 mL via INTRATHECAL

## 2015-01-25 MED ORDER — TRIAMTERENE-HCTZ 37.5-25 MG PO TABS
1.0000 | ORAL_TABLET | Freq: Every day | ORAL | Status: DC
  Administered 2015-01-26: 1 via ORAL
  Filled 2015-01-25 (×2): qty 1

## 2015-01-25 MED ORDER — SODIUM CHLORIDE 0.9 % IJ SOLN
INTRAMUSCULAR | Status: AC
  Filled 2015-01-25: qty 50

## 2015-01-25 MED ORDER — BUPIVACAINE-EPINEPHRINE (PF) 0.25% -1:200000 IJ SOLN
INTRAMUSCULAR | Status: DC | PRN
  Administered 2015-01-25: 30 mL

## 2015-01-25 MED ORDER — SODIUM CHLORIDE 0.9 % IJ SOLN
INTRAMUSCULAR | Status: DC | PRN
  Administered 2015-01-25: 30 mL

## 2015-01-25 MED ORDER — EPHEDRINE SULFATE 50 MG/ML IJ SOLN
INTRAMUSCULAR | Status: DC | PRN
  Administered 2015-01-25: 10 mg via INTRAVENOUS
  Administered 2015-01-25: 5 mg via INTRAVENOUS
  Administered 2015-01-25: 10 mg via INTRAVENOUS
  Administered 2015-01-25: 15 mg via INTRAVENOUS
  Administered 2015-01-25: 10 mg via INTRAVENOUS

## 2015-01-25 MED ORDER — ASPIRIN EC 325 MG PO TBEC
325.0000 mg | DELAYED_RELEASE_TABLET | Freq: Two times a day (BID) | ORAL | Status: DC
  Administered 2015-01-26: 325 mg via ORAL
  Filled 2015-01-25 (×3): qty 1

## 2015-01-25 MED ORDER — ONDANSETRON HCL 4 MG/2ML IJ SOLN
INTRAMUSCULAR | Status: DC | PRN
  Administered 2015-01-25: 4 mg via INTRAVENOUS

## 2015-01-25 MED ORDER — LIDOCAINE HCL (CARDIAC) 20 MG/ML IV SOLN
INTRAVENOUS | Status: DC | PRN
  Administered 2015-01-25: 50 mg via INTRAVENOUS

## 2015-01-25 MED ORDER — FENTANYL CITRATE (PF) 100 MCG/2ML IJ SOLN
INTRAMUSCULAR | Status: DC | PRN
  Administered 2015-01-25: 100 ug via INTRAVENOUS

## 2015-01-25 MED ORDER — ALUM & MAG HYDROXIDE-SIMETH 200-200-20 MG/5ML PO SUSP: 30.0000 mL | ORAL | Status: DC | PRN

## 2015-01-25 MED ORDER — DIPHENHYDRAMINE HCL 25 MG PO CAPS
25.0000 mg | ORAL_CAPSULE | Freq: Four times a day (QID) | ORAL | Status: DC | PRN
  Administered 2015-01-26: 25 mg via ORAL
  Filled 2015-01-25: qty 1

## 2015-01-25 MED ORDER — ONDANSETRON HCL 4 MG/2ML IJ SOLN: 4.0000 mg | Freq: Four times a day (QID) | INTRAMUSCULAR | Status: DC | PRN

## 2015-01-25 MED ORDER — FENTANYL CITRATE (PF) 100 MCG/2ML IJ SOLN
INTRAMUSCULAR | Status: AC
  Filled 2015-01-25: qty 4

## 2015-01-25 MED ORDER — ATENOLOL 50 MG PO TABS: 50.0000 mg | ORAL_TABLET | Freq: Every day | ORAL | Status: DC

## 2015-01-25 MED ORDER — FERROUS SULFATE 325 (65 FE) MG PO TABS
325.0000 mg | ORAL_TABLET | Freq: Three times a day (TID) | ORAL | Status: DC
  Administered 2015-01-26: 325 mg via ORAL
  Filled 2015-01-25 (×5): qty 1

## 2015-01-25 MED ORDER — DEXAMETHASONE SODIUM PHOSPHATE 10 MG/ML IJ SOLN
10.0000 mg | Freq: Once | INTRAMUSCULAR | Status: AC
  Administered 2015-01-25: 10 mg via INTRAVENOUS

## 2015-01-25 MED ORDER — MIDAZOLAM HCL 5 MG/5ML IJ SOLN
INTRAMUSCULAR | Status: DC | PRN
  Administered 2015-01-25: 2 mg via INTRAVENOUS

## 2015-01-25 MED ORDER — BUPIVACAINE-EPINEPHRINE (PF) 0.25% -1:200000 IJ SOLN
INTRAMUSCULAR | Status: AC
  Filled 2015-01-25: qty 30

## 2015-01-25 MED ORDER — METOCLOPRAMIDE HCL 10 MG PO TABS: 5.0000 mg | ORAL_TABLET | Freq: Three times a day (TID) | ORAL | Status: DC | PRN

## 2015-01-25 MED ORDER — POTASSIUM CHLORIDE 2 MEQ/ML IV SOLN
INTRAVENOUS | Status: DC
  Administered 2015-01-25: 16:00:00 via INTRAVENOUS
  Filled 2015-01-25 (×5): qty 1000

## 2015-01-25 MED ORDER — LACTATED RINGERS IV SOLN
INTRAVENOUS | Status: DC
  Administered 2015-01-25: 1000 mL via INTRAVENOUS
  Administered 2015-01-25 (×2): via INTRAVENOUS

## 2015-01-25 MED ORDER — TRANEXAMIC ACID 1000 MG/10ML IV SOLN
1000.0000 mg | Freq: Once | INTRAVENOUS | Status: AC
  Administered 2015-01-25: 1000 mg via INTRAVENOUS
  Filled 2015-01-25: qty 10

## 2015-01-25 MED ORDER — ATENOLOL 50 MG PO TABS
50.0000 mg | ORAL_TABLET | Freq: Every day | ORAL | Status: DC
  Administered 2015-01-25: 50 mg via ORAL
  Filled 2015-01-25 (×3): qty 1

## 2015-01-25 MED ORDER — DEXAMETHASONE SODIUM PHOSPHATE 10 MG/ML IJ SOLN
INTRAMUSCULAR | Status: AC
  Filled 2015-01-25: qty 1

## 2015-01-25 MED ORDER — ONDANSETRON HCL 4 MG PO TABS: 4.0000 mg | ORAL_TABLET | Freq: Four times a day (QID) | ORAL | Status: DC | PRN

## 2015-01-25 MED ORDER — CYCLOBENZAPRINE HCL 5 MG PO TABS
5.0000 mg | ORAL_TABLET | Freq: Three times a day (TID) | ORAL | Status: DC | PRN
Start: 2015-01-25 — End: 2015-01-26

## 2015-01-25 MED ORDER — EZETIMIBE 10 MG PO TABS
10.0000 mg | ORAL_TABLET | Freq: Every evening | ORAL | Status: DC
  Filled 2015-01-25 (×2): qty 1

## 2015-01-25 MED ORDER — CHLORHEXIDINE GLUCONATE 4 % EX LIQD
60.0000 mL | Freq: Once | CUTANEOUS | Status: DC
Start: 2015-01-25 — End: 2015-01-25

## 2015-01-25 MED ORDER — BISACODYL 10 MG RE SUPP: 10.0000 mg | Freq: Every day | RECTAL | Status: DC | PRN

## 2015-01-25 MED ORDER — CEFAZOLIN SODIUM-DEXTROSE 2-3 GM-% IV SOLR
2.0000 g | Freq: Four times a day (QID) | INTRAVENOUS | Status: AC
Start: 2015-01-25 — End: 2015-01-26
  Administered 2015-01-25 – 2015-01-26 (×2): 2 g via INTRAVENOUS
  Filled 2015-01-25 (×2): qty 50

## 2015-01-25 MED ORDER — METOCLOPRAMIDE HCL 5 MG/ML IJ SOLN: 5.0000 mg | Freq: Three times a day (TID) | INTRAMUSCULAR | Status: DC | PRN

## 2015-01-25 MED ORDER — LORATADINE 10 MG PO TABS
10.0000 mg | ORAL_TABLET | Freq: Every day | ORAL | Status: DC | PRN
  Filled 2015-01-25: qty 1

## 2015-01-25 MED ORDER — PROPOFOL 500 MG/50ML IV EMUL
INTRAVENOUS | Status: DC | PRN
  Administered 2015-01-25: 40 ug/kg/min via INTRAVENOUS

## 2015-01-25 MED ORDER — HYDROMORPHONE HCL 1 MG/ML IJ SOLN: 0.2500 mg | INTRAMUSCULAR | Status: DC | PRN

## 2015-01-25 MED ORDER — OXYCODONE HCL 5 MG PO TABS
20.0000 mg | ORAL_TABLET | ORAL | Status: DC
  Administered 2015-01-25 – 2015-01-26 (×6): 20 mg via ORAL
  Filled 2015-01-25 (×3): qty 4
  Filled 2015-01-25: qty 6
  Filled 2015-01-25 (×2): qty 4

## 2015-01-25 MED ORDER — PROPOFOL 10 MG/ML IV BOLUS
INTRAVENOUS | Status: DC | PRN
  Administered 2015-01-25: 30 mg via INTRAVENOUS

## 2015-01-25 SURGICAL SUPPLY — 43 items
BAG DECANTER FOR FLEXI CONT (MISCELLANEOUS) IMPLANT
BAG ZIPLOCK 12X15 (MISCELLANEOUS) IMPLANT
BANDAGE ELASTIC 6 VELCRO ST LF (GAUZE/BANDAGES/DRESSINGS) ×2 IMPLANT
BLADE SAW SGTL 13.0X1.19X90.0M (BLADE) ×2 IMPLANT
BOWL SMART MIX CTS (DISPOSABLE) ×2 IMPLANT
CAPT KNEE TOTAL 3 ATTUNE ×2 IMPLANT
CEMENT HV SMART SET (Cement) ×4 IMPLANT
CLOTH BEACON ORANGE TIMEOUT ST (SAFETY) ×2 IMPLANT
CUFF TOURN SGL QUICK 34 (TOURNIQUET CUFF) ×1
CUFF TRNQT CYL 34X4X40X1 (TOURNIQUET CUFF) ×1 IMPLANT
DECANTER SPIKE VIAL GLASS SM (MISCELLANEOUS) ×2 IMPLANT
DRAPE U-SHAPE 47X51 STRL (DRAPES) ×2 IMPLANT
DRSG AQUACEL AG ADV 3.5X10 (GAUZE/BANDAGES/DRESSINGS) ×2 IMPLANT
DURAPREP 26ML APPLICATOR (WOUND CARE) ×4 IMPLANT
ELECT REM PT RETURN 9FT ADLT (ELECTROSURGICAL) ×2
ELECTRODE REM PT RTRN 9FT ADLT (ELECTROSURGICAL) ×1 IMPLANT
GLOVE BIOGEL PI IND STRL 7.5 (GLOVE) ×6 IMPLANT
GLOVE BIOGEL PI IND STRL 8.5 (GLOVE) ×1 IMPLANT
GLOVE BIOGEL PI INDICATOR 7.5 (GLOVE) ×6
GLOVE BIOGEL PI INDICATOR 8.5 (GLOVE) ×1
GLOVE ECLIPSE 8.0 STRL XLNG CF (GLOVE) ×2 IMPLANT
GLOVE ORTHO TXT STRL SZ7.5 (GLOVE) ×4 IMPLANT
GOWN SPEC L3 XXLG W/TWL (GOWN DISPOSABLE) ×2 IMPLANT
GOWN STRL REUS W/TWL LRG LVL3 (GOWN DISPOSABLE) ×8 IMPLANT
HANDPIECE INTERPULSE COAX TIP (DISPOSABLE) ×1
LIQUID BAND (GAUZE/BANDAGES/DRESSINGS) ×2 IMPLANT
MANIFOLD NEPTUNE II (INSTRUMENTS) ×2 IMPLANT
PACK TOTAL KNEE CUSTOM (KITS) ×2 IMPLANT
POSITIONER SURGICAL ARM (MISCELLANEOUS) ×2 IMPLANT
SET HNDPC FAN SPRY TIP SCT (DISPOSABLE) ×1 IMPLANT
SET PAD KNEE POSITIONER (MISCELLANEOUS) ×2 IMPLANT
SUCTION FRAZIER 12FR DISP (SUCTIONS) ×2 IMPLANT
SUT MNCRL AB 4-0 PS2 18 (SUTURE) ×2 IMPLANT
SUT VIC AB 1 CT1 36 (SUTURE) ×2 IMPLANT
SUT VIC AB 2-0 CT1 27 (SUTURE) ×3
SUT VIC AB 2-0 CT1 TAPERPNT 27 (SUTURE) ×3 IMPLANT
SUT VLOC 180 0 24IN GS25 (SUTURE) ×2 IMPLANT
SYR 50ML LL SCALE MARK (SYRINGE) ×2 IMPLANT
TRAY FOLEY W/METER SILVER 14FR (SET/KITS/TRAYS/PACK) ×2 IMPLANT
TRAY FOLEY W/METER SILVER 16FR (SET/KITS/TRAYS/PACK) IMPLANT
WATER STERILE IRR 1500ML POUR (IV SOLUTION) ×2 IMPLANT
WRAP KNEE MAXI GEL POST OP (GAUZE/BANDAGES/DRESSINGS) ×2 IMPLANT
YANKAUER SUCT BULB TIP 10FT TU (MISCELLANEOUS) ×2 IMPLANT

## 2015-01-25 NOTE — Interval H&P Note (Signed)
History and Physical Interval Note:  01/25/2015 11:16 AM  Brittany Mercer  has presented today for surgery, with the diagnosis of right knee osteoarthritis  The various methods of treatment have been discussed with the patient and family. After consideration of risks, benefits and other options for treatment, the patient has consented to  Procedure(s): RIGHT TOTAL KNEE ARTHROPLASTY (Right) as a surgical intervention .  The patient's history has been reviewed, patient examined, no change in status, stable for surgery.  I have reviewed the patient's chart and labs.  Questions were answered to the patient's satisfaction.     Mauri Pole

## 2015-01-25 NOTE — Plan of Care (Signed)
Problem: Consults Goal: Diagnosis- Total Joint Replacement Left total knee     

## 2015-01-25 NOTE — Discharge Instructions (Signed)

## 2015-01-25 NOTE — Transfer of Care (Signed)
Immediate Anesthesia Transfer of Care Note  Patient: Brittany Mercer  Procedure(s) Performed: Procedure(s): RIGHT TOTAL KNEE ARTHROPLASTY (Right)  Patient Location: PACU  Anesthesia Type:Spinal  Level of Consciousness: awake, alert  and oriented  Airway & Oxygen Therapy: Patient Spontanous Breathing and Patient connected to face mask oxygen  Post-op Assessment: Report given to RN and Post -op Vital signs reviewed and stable  Post vital signs: Reviewed and stable  Last Vitals:  Filed Vitals:   01/25/15 1000  BP: 137/60  Pulse: 71  Temp: 37.2 C  Resp: 16    Complications: No apparent anesthesia complications

## 2015-01-25 NOTE — Op Note (Signed)
NAME:  Brittany Mercer                      MEDICAL RECORD NO.:  [id number]                             FACILITY:  [facility]      PHYSICIAN:  Pietro Cassis. Alvan Dame, M.D.  DATE OF BIRTH:  09-04-1945      DATE OF PROCEDURE:  01/25/2015                                     OPERATIVE REPORT         PREOPERATIVE DIAGNOSIS:  Right knee osteoarthritis.      POSTOPERATIVE DIAGNOSIS:  Right knee osteoarthritis.      FINDINGS:  The patient was noted to have complete loss of cartilage and   bone-on-bone arthritis with associated osteophytes in the medial and patellofemoral compartments of   the knee with a significant synovitis and associated effusion.      PROCEDURE:  Right total knee replacement.      COMPONENTS USED:  DePuy Attune rotating platform posterior stabilized knee   system, a size 5N femur, 3 tibia, size 6 mm PS AOX insert, and 35 anatomic patellar   button.      SURGEON:  Pietro Cassis. Alvan Dame, M.D.      ASSISTANT:  Danae Orleans, PA-C.      ANESTHESIA:  Spinal.      SPECIMENS:  None.      COMPLICATION:  None.      DRAINS:  None.  EBL: <50cc      TOURNIQUET TIME:   Total Tourniquet Time Documented: Thigh (Right) - 30 minutes Total: Thigh (Right) - 30 minutes  .      The patient was stable to the recovery room.      INDICATION FOR PROCEDURE:  Brittany Mercer is a 69 y.o. female patient of   mine.  The patient had been seen, evaluated, and treated conservatively in the   office with medication, activity modification, and injections.  The patient had   radiographic changes of bone-on-bone arthritis with endplate sclerosis and osteophytes noted.      The patient failed conservative measures including medication, injections, and activity modification, and at this point was ready for more definitive measures.   Based on the radiographic changes and failed conservative measures, the patient   decided to proceed with total knee replacement.  Risks of infection,   DVT, component  failure, need for revision surgery, postop course, and   expectations were all   discussed and reviewed.  Consent was obtained for benefit of pain   relief.      PROCEDURE IN DETAIL:  The patient was brought to the operative theater.   Once adequate anesthesia, preoperative antibiotics, 2 gm of Ancef, 1 gm of Tranexamic Acid, and 10 mg of Decadron administered, the patient was positioned supine with the right thigh tourniquet placed.  The  right lower extremity was prepped and draped in sterile fashion.  A time-   out was performed identifying the patient, planned procedure, and   extremity.      The right lower extremity was placed in the [facility] leg holder.  The leg was   exsanguinated, tourniquet elevated to 250 mmHg.  A midline incision was   made followed by  median parapatellar arthrotomy.  Following initial   exposure, attention was first directed to the patella.  Precut   measurement was noted to be 22 mm.  I resected down to 14 mm and used a   35 patellar button to restore patellar height as well as cover the cut   surface.      The lug holes were drilled and a metal shim was placed to protect the   patella from retractors and saw blades.      At this point, attention was now directed to the femur.  The femoral   canal was opened with a drill, irrigated to try to prevent fat emboli.  An   intramedullary rod was passed at 3 degrees valgus, 9 mm of bone was   resected off the distal femur.  Following this resection, the tibia was   subluxated anteriorly.  Using the extramedullary guide, 2 mm of bone was resected off   the proximal medial tibia.  We confirmed the gap would be   stable medially and laterally with a size 5 mm insert as well as confirmed   the cut was perpendicular in the coronal plane, checking with an alignment rod.      Once this was done, I sized the femur to be a size 5 in the anterior-   posterior dimension, chose a narrow component based on medial and    lateral dimension.  The size 5 rotation block was then pinned in   position anterior referenced using the C-clamp to set rotation.  The   anterior, posterior, and  chamfer cuts were made without difficulty nor   notching making certain that I was along the anterior cortex to help   with flexion gap stability.      The final box cut was made off the lateral aspect of distal femur.      At this point, the tibia was sized to be a size 3, the size 3 tray was   then pinned in position through the medial third of the tubercle,   drilled, and keel punched.  Trial reduction was now carried with a 5 femur,  3 tibia, a size 6 mm insert, and the 35 patella botton.  The knee was brought to   extension, full extension with good flexion stability with the patella   tracking through the trochlea without application of pressure.  Given   all these findings, the trial components removed.  Final components were   opened and cement was mixed.  The knee was irrigated with normal saline   solution and pulse lavage.  The synovial lining was   then injected with 30cc of 0.25% Marcaine with epinephrine and 1 cc of Toradol plus 30 cc of NS for a  total of 61 cc.      The knee was irrigated.  Final implants were then cemented onto clean and   dried cut surfaces of bone with the knee brought to extension with a size 6   mm trial insert.      Once the cement had fully cured, the excess cement was removed   throughout the knee.  I confirmed I was satisfied with the range of   motion and stability, and the final size 6 mm PS AOX insert was chosen.  It was   placed into the knee.      The tourniquet had been let down at 30 minutes.  No significant   hemostasis required.  The  extensor mechanism was then reapproximated using #1 Vicryl and # 0 V-lock sutures with the knee   in flexion.  The   remaining wound was closed with 2-0 Vicryl and running 4-0 Monocryl.   The knee was cleaned, dried, dressed sterilely using  Dermabond and   Aquacel dressing.  The patient was then   brought to recovery room in stable condition, tolerating the procedure   well.   Please note that Physician Assistant, Danae Orleans, PA-C, was present for the entirety of the case, and was utilized for pre-operative positioning, peri-operative retractor management, general facilitation of the procedure.  He was also utilized for primary wound closure at the end of the case.              Pietro Cassis Alvan Dame, M.D.    01/25/2015 1:41 PM

## 2015-01-25 NOTE — Anesthesia Postprocedure Evaluation (Signed)
  Anesthesia Post-op Note  Patient: Brittany Mercer  Procedure(s) Performed: Procedure(s): RIGHT TOTAL KNEE ARTHROPLASTY (Right)  Patient Location: PACU  Anesthesia Type: Spinal/MAC  Level of Consciousness: awake and alert   Airway and Oxygen Therapy: Patient Spontanous Breathing  Post-op Pain: Controlled  Post-op Assessment: Post-op Vital signs reviewed, Patient's Cardiovascular Status Stable and Respiratory Function Stable. Block receeding.  Post-op Vital Signs: Reviewed  Filed Vitals:   01/25/15 1500  BP: 127/92  Pulse: 63  Temp:   Resp: 16    Complications: No apparent anesthesia complications

## 2015-01-25 NOTE — Anesthesia Procedure Notes (Signed)
Spinal Patient location during procedure: OR Start time: 01/25/2015 12:21 PM End time: 01/25/2015 12:26 PM Staffing Anesthesiologist: Roderic Palau Performed by: anesthesiologist  Preanesthetic Checklist Completed: patient identified, surgical consent, pre-op evaluation, timeout performed, IV checked, risks and benefits discussed and monitors and equipment checked Spinal Block Patient position: sitting Prep: DuraPrep Patient monitoring: cardiac monitor, continuous pulse ox and blood pressure Approach: midline Location: L3-4 Injection technique: single-shot Needle Needle type: Quincke  Needle gauge: 22 G Needle length: 9 cm Assessment Sensory level: T6 Additional Notes Functioning IV was confirmed and monitors were applied. Sterile prep and drape, including hand hygiene and sterile gloves were used. The patient was positioned and the spine was prepped. The skin was anesthetized with lidocaine.  Free flow of clear CSF was obtained prior to injecting local anesthetic into the CSF.  The spinal needle aspirated freely following injection.  The needle was carefully withdrawn.  The patient tolerated the procedure well. CRNA made two attempts with 24G Sprotte prior to my attempt.

## 2015-01-25 NOTE — Anesthesia Preprocedure Evaluation (Addendum)
Anesthesia Evaluation  Patient identified by MRN, date of birth, ID band Patient awake    Reviewed: Allergy & Precautions, H&P , NPO status , Patient's Chart, lab work & pertinent test results, reviewed documented beta blocker date and time   Airway Mallampati: II  TM Distance: >3 FB Neck ROM: Full    Dental no notable dental hx. (+) Teeth Intact, Dental Advisory Given   Pulmonary neg pulmonary ROS, former smoker,    Pulmonary exam normal breath sounds clear to auscultation       Cardiovascular hypertension, Pt. on medications and Pt. on home beta blockers  Rhythm:Regular Rate:Normal     Neuro/Psych  Headaches, negative psych ROS   GI/Hepatic negative GI ROS, Neg liver ROS,   Endo/Other  negative endocrine ROS  Renal/GU negative Renal ROS  negative genitourinary   Musculoskeletal  (+) Arthritis , Osteoarthritis,    Abdominal   Peds  Hematology negative hematology ROS (+)   Anesthesia Other Findings   Reproductive/Obstetrics negative OB ROS                            Anesthesia Physical Anesthesia Plan  ASA: II  Anesthesia Plan: Spinal   Post-op Pain Management:    Induction: Intravenous  Airway Management Planned: Simple Face Mask  Additional Equipment:   Intra-op Plan:   Post-operative Plan:   Informed Consent: I have reviewed the patients History and Physical, chart, labs and discussed the procedure including the risks, benefits and alternatives for the proposed anesthesia with the patient or authorized representative who has indicated his/her understanding and acceptance.   Dental advisory given  Plan Discussed with: CRNA  Anesthesia Plan Comments:         Anesthesia Quick Evaluation

## 2015-01-26 LAB — CBC
HCT: 35 % — ABNORMAL LOW (ref 36.0–46.0)
Hemoglobin: 12.1 g/dL (ref 12.0–15.0)
MCH: 31.4 pg (ref 26.0–34.0)
MCHC: 34.6 g/dL (ref 30.0–36.0)
MCV: 90.9 fL (ref 78.0–100.0)
Platelets: 143 10*3/uL — ABNORMAL LOW (ref 150–400)
RBC: 3.85 MIL/uL — ABNORMAL LOW (ref 3.87–5.11)
RDW: 13.4 % (ref 11.5–15.5)
WBC: 10.7 10*3/uL — ABNORMAL HIGH (ref 4.0–10.5)

## 2015-01-26 LAB — BASIC METABOLIC PANEL
Anion gap: 6 (ref 5–15)
BUN: 19 mg/dL (ref 6–20)
CO2: 26 mmol/L (ref 22–32)
Calcium: 8.7 mg/dL — ABNORMAL LOW (ref 8.9–10.3)
Chloride: 112 mmol/L — ABNORMAL HIGH (ref 101–111)
Creatinine, Ser: 0.88 mg/dL (ref 0.44–1.00)
GFR calc Af Amer: >60 mL/min (ref >60)
GFR calc non Af Amer: >60 mL/min (ref >60)
Glucose, Bld: 115 mg/dL — ABNORMAL HIGH (ref 65–99)
Potassium: 4 mmol/L (ref 3.5–5.1)
Sodium: 144 mmol/L (ref 135–145)

## 2015-01-26 MED ORDER — DOCUSATE SODIUM 100 MG PO CAPS: 100.0000 mg | ORAL_CAPSULE | Freq: Two times a day (BID) | ORAL | Status: DC

## 2015-01-26 MED ORDER — CYCLOBENZAPRINE HCL 5 MG PO TABS: 5.0000 mg | ORAL_TABLET | Freq: Three times a day (TID) | ORAL | Status: DC | PRN

## 2015-01-26 MED ORDER — FERROUS SULFATE 325 (65 FE) MG PO TABS: 325.0000 mg | ORAL_TABLET | Freq: Three times a day (TID) | ORAL | Status: DC

## 2015-01-26 MED ORDER — ASPIRIN 325 MG PO TBEC: 325.0000 mg | DELAYED_RELEASE_TABLET | Freq: Two times a day (BID) | ORAL | Status: AC

## 2015-01-26 MED ORDER — POLYETHYLENE GLYCOL 3350 17 G PO PACK: 17.0000 g | PACK | Freq: Two times a day (BID) | ORAL | Status: DC

## 2015-01-26 NOTE — Evaluation (Signed)
Occupational Therapy One Time Evaluation Patient Details Name: Brittany Mercer MRN: [id number] DOB: [birth year] Today's Date: 01/26/2015    History of Present Illness 69 yo female s/p R TKA 01/25/15.    Clinical Impression   Pt doing well. All education completed for OT. Pt will have assist by son and daughter in law for a week. No further OT needs.    Follow Up Recommendations  No OT follow up;Supervision/Assistance - 24 hour    Equipment Recommendations  None recommended by OT    Recommendations for Other Services       Precautions / Restrictions Precautions Precautions: Fall;Knee Restrictions Weight Bearing Restrictions: No RLE Weight Bearing: Weight bearing as tolerated      Mobility Bed Mobility      General bed mobility comments: in chair.   Transfers Overall transfer level: Needs assistance Equipment used: Rolling walker (2 wheeled) Transfers: Sit to/from Stand Sit to Stand: Min guard         General transfer comment: cues for hand placement and LE management. guard for safety.    Balance                                            ADL Overall ADL's : Needs assistance/impaired Eating/Feeding: Independent;Sitting   Grooming: Wash/dry hands;Min guard;Standing   Upper Body Bathing: Set up;Sitting   Lower Body Bathing: Minimal assistance;Sit to/from stand   Upper Body Dressing : Set up;Sitting   Lower Body Dressing: Minimal assistance;Sit to/from stand   Toilet Transfer: Min guard;Ambulation;Comfort height toilet;Grab bars;RW   Toileting- Water quality scientist and Hygiene: Min guard;Sit to/from stand   Tub/ Shower Transfer: Walk-in shower;Minimal assistance;Rolling walker     General ADL Comments: Discussed sequence for shower transfer and practiced technique. Issued shower transfer handout and reviewed information. Light min assist for support to lift walker in and out of shower. Educated on Lb dressing techniques and to  sit down to thread clothing over LEs--daughter in law can assist until pt able. Pt has a higher commode and vanity next to commode and did well with higher commode here.      Vision     Perception     Praxis      Pertinent Vitals/Pain Pain Assessment: 0-10 Pain Score: 6  Pain Location: R knee with activity Pain Descriptors / Indicators: Aching;Sore Pain Intervention(s): Monitored during session;Ice applied;Repositioned     Hand Dominance     Extremity/Trunk Assessment Upper Extremity Assessment Upper Extremity Assessment: Overall WFL for tasks assessed      Cervical / Trunk Assessment Cervical / Trunk Assessment: Normal   Communication Communication Communication: No difficulties   Cognition Arousal/Alertness: Awake/alert Behavior During Therapy: WFL for tasks assessed/performed Overall Cognitive Status: Within Functional Limits for tasks assessed                     General Comments       Exercises       Shoulder Instructions      Home Living Family/patient expects to be discharged to:: Private residence Living Arrangements: Alone Available Help at Discharge: Family (son and daughter n law) Type of Home: House Home Access: Stairs to enter Technical brewer of Steps: 2 Entrance Stairs-Rails: None Home Layout: Able to live on main level with bedroom/bathroom     Bathroom Shower/Tub: Occupational psychologist: Handicapped height  Home Equipment: Benson - 2 wheels;Walker - 4 wheels;Cane - single point;Shower seat          Prior Functioning/Environment Level of Independence: Independent with assistive device(s)        Comments: used walker intermittently    OT Diagnosis: Generalized weakness   OT Problem List:     OT Treatment/Interventions:      OT Goals(Current goals can be found in the care plan section) Acute Rehab OT Goals Patient Stated Goal: home later. OT Goal Formulation: With patient  OT Frequency:      Barriers to D/C:            Co-evaluation              End of Session Equipment Utilized During Treatment: Rolling walker  Activity Tolerance: Patient tolerated treatment well Patient left: in chair;with call bell/phone within reach   Time: 1001-1029 OT Time Calculation (min): 28 min Charges:  OT General Charges $OT Visit: 1 Procedure OT Evaluation $Initial OT Evaluation Tier I: 1 Procedure OT Treatments $Therapeutic Activity: 8-22 mins G-Codes:    Jules Schick  [phone] 01/26/2015, 11:23 AM

## 2015-01-26 NOTE — Progress Notes (Signed)
Physical Therapy Treatment Patient Details Name: Brittany Mercer MRN: [id number] DOB: 06/09/1945 Today's Date: 01/26/2015    History of Present Illness 69 yo female s/p R TKA 01/25/15.     PT Comments    Progressing well with mobility. All education completed. REady to d/c from PT standpoint.   Follow Up Recommendations  Home health PT     Equipment Recommendations  None recommended by PT    Recommendations for Other Services       Precautions / Restrictions Precautions Precautions: Knee;Fall Restrictions Weight Bearing Restrictions: No RLE Weight Bearing: Weight bearing as tolerated    Mobility  Bed Mobility Overal bed mobility: Needs Assistance Bed Mobility: Supine to Sit     Supine to sit: Min guard     General bed mobility comments: close guard for safety  Transfers Overall transfer level: Needs assistance Equipment used: Rolling walker (2 wheeled) Transfers: Sit to/from Stand Sit to Stand: Min guard         General transfer comment: cues for hand placement and LE management. guard for safety.  Ambulation/Gait Ambulation/Gait assistance: Min guard Ambulation Distance (Feet): 135 Feet Assistive device: Rolling walker (2 wheeled) Gait Pattern/deviations: Step-to pattern     General Gait Details: close guard for safety. VCs safety, sequence.    Stairs Stairs:  (Verbally reviewed/demonstrated technique.)          Wheelchair Mobility    Modified Rankin (Stroke Patients Only)       Balance                                    Cognition Arousal/Alertness: Awake/alert Behavior During Therapy: WFL for tasks assessed/performed Overall Cognitive Status: Within Functional Limits for tasks assessed                      Exercises    General Comments        Pertinent Vitals/Pain Pain Assessment: 0-10 Pain Score: 5  Pain Location: R knee Pain Descriptors / Indicators: Aching;Sore Pain Intervention(s): Monitored  during session;Repositioned    Home Living Family/patient expects to be discharged to:: Private residence Living Arrangements: Alone Available Help at Discharge: Family (son and daughter n law) Type of Home: House Home Access: Stairs to enter Entrance Stairs-Rails: None Home Layout: Able to live on main level with bedroom/bathroom Home Equipment: Walker - 2 wheels;Walker - 4 wheels;Cane - single point;Shower seat      Prior Function Level of Independence: Independent with assistive device(s)      Comments: used walker intermittently   PT Goals (current goals can now be found in the care plan section) Acute Rehab PT Goals Patient Stated Goal: home later. PT Goal Formulation: With patient Time For Goal Achievement: 02/02/15 Potential to Achieve Goals: Good Progress towards PT goals: Progressing toward goals    Frequency  7X/week    PT Plan Current plan remains appropriate    Co-evaluation             End of Session Equipment Utilized During Treatment: Gait belt Activity Tolerance: Patient tolerated treatment well Patient left: in bed;with call bell/phone within reach;with family/visitor present     Time: 5277-8242 PT Time Calculation (min) (ACUTE ONLY): 10 min  Charges:  $Gait Training: 8-22 mins                    G Codes:      Weston Anna,  MPT Pager: [phone]

## 2015-01-26 NOTE — Progress Notes (Signed)
Utilization review completed.  

## 2015-01-26 NOTE — Progress Notes (Signed)
Patient ID: Brittany Mercer, female   DOB: 1945/10/10, 69 y.o.   MRN: [id number] Subjective: 1 Day Post-Op Procedure(s) (LRB): RIGHT TOTAL KNEE ARTHROPLASTY (Right)    Patient reports pain as moderate.  Pain control actually good for now.  Up ready emails  Objective:   VITALS:   Filed Vitals:   01/26/15 0513  BP: 97/51  Pulse: 67  Temp: 98.2 F (36.8 C)  Resp: 16    Neurovascular intact Incision: dressing C/D/I  LABS  Recent Labs  01/26/15 0504  HGB 12.1  HCT 35.0*  WBC 10.7*  PLT 143*     Recent Labs  01/26/15 0504  NA 144  K 4.0  BUN 19  CREATININE 0.88  GLUCOSE 115*    No results for input(s): LABPT, INR in the last 72 hours.   Assessment/Plan: 1 Day Post-Op Procedure(s) (LRB): RIGHT TOTAL KNEE ARTHROPLASTY (Right)   Advance diet Up with therapy Discharge home with home health today after therapy X2  RTC in 2 weeks To follow up with Dr. Francesco Runner (pain physician) within a week to follow up her regiment Reviewed goals

## 2015-01-26 NOTE — Care Management Note (Signed)
Case Management Note  Patient Details  Name: Brittany Mercer MRN: [id number] Date of Birth: [birth year]  Subjective/Objective:                  RIGHT TOTAL KNEE ARTHROPLASTY (Right)  Action/Plan:  Discharge planning Expected Discharge Date:  01/26/15               Expected Discharge Plan:  [place]  In-House Referral:     Discharge planning Services  CM Consult  Post Acute Care Choice:    Choice offered to:  Patient  DME Arranged:  N/A DME Agency:  NA  HH Arranged:  PT HH Agency:  [facility]  Status of Service:  Completed, signed off  Medicare Important Message Given:    Date Medicare IM Given:    Medicare IM give by:    Date Additional Medicare IM Given:    Additional Medicare Important Message give by:     If discussed at [place] of Stay Meetings, dates discussed:    Additional Comments: Cm met with pt in room to offer choice of home health agency.  Pt chooses [facility] to render HHPT.  Referral given to [facility] rep, Tim (on unit).  Pt has DME at home.  No other CM needs were communicated. Dellie Catholic, RN 01/26/2015, 10:33 AM

## 2015-01-26 NOTE — Evaluation (Signed)
Physical Therapy Evaluation Patient Details Name: Brittany Mercer MRN: [id number] DOB: 28-Aug-1945 Today's Date: 01/26/2015   History of Present Illness  69 yo female s/p R TKA 01/25/15.   Clinical Impression  On eval, pt required Min assist-Min guard assist for mobility-walked ~135 feet with RW. Pain rated 6/10 with activity. Will plan to have a 2nd session to practice steps prior to d/c.     Follow Up Recommendations Home health PT    Equipment Recommendations  None recommended by PT    Recommendations for Other Services       Precautions / Restrictions Precautions Precautions: Fall;Knee Restrictions Weight Bearing Restrictions: No RLE Weight Bearing: Weight bearing as tolerated      Mobility  Bed Mobility Overal bed mobility: Needs Assistance Bed Mobility: Supine to Sit     Supine to sit: Min assist     General bed mobility comments: small amount of assist for LE.  Transfers Overall transfer level: Needs assistance Equipment used: Rolling walker (2 wheeled) Transfers: Sit to/from Stand Sit to Stand: Min guard         General transfer comment: cues for hand placement and LE management. guard for safety.  Ambulation/Gait Ambulation/Gait assistance: Min guard Ambulation Distance (Feet): 135 Feet Assistive device: Rolling walker (2 wheeled) Gait Pattern/deviations: Step-to pattern;Antalgic     General Gait Details: close guard for safety. VCs safety, sequence.   Stairs            Wheelchair Mobility    Modified Rankin (Stroke Patients Only)       Balance                                             Pertinent Vitals/Pain Pain Assessment: 0-10 Pain Score: 6  Pain Location: R knee with activity Pain Descriptors / Indicators: Aching;Sore Pain Intervention(s): Monitored during session;Ice applied;Repositioned    Home Living Family/patient expects to be discharged to:: Private residence Living Arrangements:  Alone Available Help at Discharge: Family (son and daughter n law) Type of Home: House Home Access: Stairs to enter Entrance Stairs-Rails: None Entrance Stairs-Number of Steps: 2 Home Layout: Able to live on main level with bedroom/bathroom Home Equipment: Walker - 2 wheels;Walker - 4 wheels;Cane - single point;Shower seat      Prior Function Level of Independence: Independent with assistive device(s)         Comments: used walker intermittently     Hand Dominance        Extremity/Trunk Assessment   Upper Extremity Assessment: Overall WFL for tasks assessed           Lower Extremity Assessment: Generalized weakness      Cervical / Trunk Assessment: Normal  Communication   Communication: No difficulties  Cognition Arousal/Alertness: Awake/alert Behavior During Therapy: WFL for tasks assessed/performed Overall Cognitive Status: Within Functional Limits for tasks assessed                      General Comments      Exercises Total Joint Exercises Ankle Circles/Pumps: AROM;Both;10 reps;Supine Quad Sets: AROM;Both;10 reps;Supine Heel Slides: AAROM;Right;10 reps;Supine Hip ABduction/ADduction: AROM;AAROM;Right;10 reps;Supine Straight Leg Raises: AROM;AAROM;Right;10 reps;Supine Goniometric ROM: ~10-50 degrees      Assessment/Plan    PT Assessment Patient needs continued PT services  PT Diagnosis Difficulty walking;Acute pain   PT Problem List Decreased strength;Decreased range of motion;Decreased activity tolerance;Decreased balance;Decreased  mobility;Decreased knowledge of use of DME;Pain  PT Treatment Interventions DME instruction;Gait training;Stair training;Functional mobility training;Therapeutic activities;Patient/family education;Balance training;Therapeutic exercise   PT Goals (Current goals can be found in the Care Plan section) Acute Rehab PT Goals Patient Stated Goal: home later. PT Goal Formulation: With patient Time For Goal  Achievement: 02/02/15 Potential to Achieve Goals: Good    Frequency 7X/week   Barriers to discharge        Co-evaluation               End of Session Equipment Utilized During Treatment: Gait belt Activity Tolerance: Patient tolerated treatment well Patient left: in chair;with call bell/phone within reach;with chair alarm set           Time: [phone] PT Time Calculation (min) (ACUTE ONLY): 23 min   Charges:   PT Evaluation $Initial PT Evaluation Tier I: 1 Procedure PT Treatments $Gait Training: 8-22 mins   PT G Codes:        Weston Anna, MPT Pager: [phone]

## 2015-01-27 DIAGNOSIS — Z87891 Personal history of nicotine dependence: Secondary | ICD-10-CM | POA: Diagnosis not present

## 2015-01-27 DIAGNOSIS — Z96651 Presence of right artificial knee joint: Secondary | ICD-10-CM | POA: Diagnosis not present

## 2015-01-27 DIAGNOSIS — H8110 Benign paroxysmal vertigo, unspecified ear: Secondary | ICD-10-CM | POA: Diagnosis not present

## 2015-01-27 DIAGNOSIS — I10 Essential (primary) hypertension: Secondary | ICD-10-CM | POA: Diagnosis not present

## 2015-01-27 DIAGNOSIS — Z471 Aftercare following joint replacement surgery: Secondary | ICD-10-CM | POA: Diagnosis not present

## 2015-01-27 DIAGNOSIS — G609 Hereditary and idiopathic neuropathy, unspecified: Secondary | ICD-10-CM | POA: Diagnosis not present

## 2015-01-28 DIAGNOSIS — Z471 Aftercare following joint replacement surgery: Secondary | ICD-10-CM | POA: Diagnosis not present

## 2015-01-28 DIAGNOSIS — I10 Essential (primary) hypertension: Secondary | ICD-10-CM | POA: Diagnosis not present

## 2015-01-28 DIAGNOSIS — G609 Hereditary and idiopathic neuropathy, unspecified: Secondary | ICD-10-CM | POA: Diagnosis not present

## 2015-01-28 DIAGNOSIS — H8110 Benign paroxysmal vertigo, unspecified ear: Secondary | ICD-10-CM | POA: Diagnosis not present

## 2015-01-28 DIAGNOSIS — Z96651 Presence of right artificial knee joint: Secondary | ICD-10-CM | POA: Diagnosis not present

## 2015-01-28 DIAGNOSIS — Z87891 Personal history of nicotine dependence: Secondary | ICD-10-CM | POA: Diagnosis not present

## 2015-01-29 DIAGNOSIS — I10 Essential (primary) hypertension: Secondary | ICD-10-CM | POA: Diagnosis not present

## 2015-01-29 DIAGNOSIS — Z96651 Presence of right artificial knee joint: Secondary | ICD-10-CM | POA: Diagnosis not present

## 2015-01-29 DIAGNOSIS — Z471 Aftercare following joint replacement surgery: Secondary | ICD-10-CM | POA: Diagnosis not present

## 2015-01-29 DIAGNOSIS — G609 Hereditary and idiopathic neuropathy, unspecified: Secondary | ICD-10-CM | POA: Diagnosis not present

## 2015-01-29 DIAGNOSIS — Z87891 Personal history of nicotine dependence: Secondary | ICD-10-CM | POA: Diagnosis not present

## 2015-01-29 DIAGNOSIS — H8110 Benign paroxysmal vertigo, unspecified ear: Secondary | ICD-10-CM | POA: Diagnosis not present

## 2015-02-01 NOTE — Discharge Summary (Signed)
Physician Discharge Summary  Patient ID: Brittany Mercer MRN: [id number] DOB/AGE: 68-31-1947 68 y.o.  Admit date: 01/25/2015 Discharge date: 01/26/2015   Procedures:  Procedure(s) (LRB): RIGHT TOTAL KNEE ARTHROPLASTY (Right)  Attending Physician:  Dr. Durene Romans   Admission Diagnoses:   Right knee primary OA / pain  Discharge Diagnoses:  Principal Problem:   S/P right TKA Active Problems:   S/P knee replacement  Past Medical History  Diagnosis Date  . Hypertension   . Hyperlipidemia   . Peripheral neuropathy (HCC)   . Headache   . Arthritis   . Sleepwalking     IN PAST - NONE IN PAST 2 YRS    HPI:    Brittany Mercer, 69 y.o. female, has a history of pain and functional disability in the right knee due to arthritis and has failed non-surgical conservative treatments for greater than 12 weeks to includeNSAID's and/or analgesics, corticosteriod injections, use of assistive devices and activity modification. Onset of symptoms was gradual, starting 1+ years ago with gradually worsening course since that time. The patient noted prior procedures on the knee to include arthroscopy on the right knee(s). Patient currently rates pain in the right knee(s) at 6 out of 10 with activity. Patient has night pain, worsening of pain with activity and weight bearing, pain that interferes with activities of daily living, pain with passive range of motion, crepitus and joint swelling. Patient has evidence of periarticular osteophytes and joint space narrowing by imaging studies. There is no active infection. Risks, benefits and expectations were discussed with the patient. Risks including but not limited to the risk of anesthesia, blood clots, nerve damage, blood vessel damage, failure of the prosthesis, infection and up to and including death. Patient understand the risks, benefits and expectations and wishes to proceed with surgery.   PCP: Mechele Claude, MD   Discharged Condition:  good  Hospital Course:  Patient underwent the above stated procedure on 01/25/2015. Patient tolerated the procedure well and brought to the recovery room in good condition and subsequently to the floor.  POD #1 BP: 97/51 ; Pulse: 67 ; Temp: 98.2 F (36.8 C) ; Resp: 16 Patient reports pain as moderate. Pain control actually good for now. Up already with PT.  Ready to be discharged home. Neurovascular intact and incision: dressing C/D/I  LABS  Basename    HGB  12.1  HCT  35.0    Discharge Exam: General appearance: alert, cooperative and no distress Extremities: Homans sign is negative, no sign of DVT, no edema, redness or tenderness in the calves or thighs and no ulcers, gangrene or trophic changes  Disposition: Home with follow up in 2 weeks   Follow-up Information    Follow up with Shelda Pal, MD. Schedule an appointment as soon as possible for a visit in 2 weeks.   Specialty:  Orthopedic Surgery   Contact information:   [address] [place] [address] [phone]       Follow up with [facility].   Why:  home health physical therapy   Contact information:   [address] [place] [address] [phone]       Discharge Instructions    Call MD / Call 911    Complete by:  As directed   If you experience chest pain or shortness of breath, CALL 911 and be transported to the hospital emergency room.  If you develope a fever above 101 F, pus (white drainage) or increased drainage or redness  at the wound, or calf pain, call your surgeon's office.     Change dressing    Complete by:  As directed   Maintain surgical dressing until follow up in the clinic. If the edges start to pull up, may reinforce with tape. If the dressing is no longer working, may remove and cover with gauze and tape, but must keep the area dry and clean.  Call with any questions or concerns.     Constipation Prevention    Complete by:  As directed   Drink  plenty of fluids.  Prune juice may be helpful.  You may use a stool softener, such as Colace (over the counter) 100 mg twice a day.  Use MiraLax (over the counter) for constipation as needed.     Diet - low sodium heart healthy    Complete by:  As directed      Discharge instructions    Complete by:  As directed   Maintain surgical dressing until follow up in the clinic. If the edges start to pull up, may reinforce with tape. If the dressing is no longer working, may remove and cover with gauze and tape, but must keep the area dry and clean.  Follow up in 2 weeks at [facility] Call with any questions or concerns.     Increase activity slowly as tolerated    Complete by:  As directed   Weight bearing as tolerated with assist device (walker, cane, etc) as directed, use it as long as suggested by your surgeon or therapist, typically at least 4-6 weeks.     TED hose    Complete by:  As directed   Use stockings (TED hose) for 2 weeks on both leg(s).  You may remove them at night for sleeping.             Medication List    STOP taking these medications        DULoxetine 30 MG capsule  Commonly known as:  CYMBALTA     ibuprofen 200 MG tablet  Commonly known as:  ADVIL,MOTRIN      TAKE these medications        aspirin 325 MG EC tablet  Take 1 tablet (325 mg total) by mouth 2 (two) times daily.     atenolol 50 MG tablet  Commonly known as:  TENORMIN  Take 1 tablet (50 mg total) by mouth daily.     BIOTIN PO  Take 2 tablets by mouth daily. Gummies.     cyclobenzaprine 5 MG tablet  Commonly known as:  FLEXERIL  Take 1 tablet (5 mg total) by mouth 3 (three) times daily as needed for muscle spasms.     docusate sodium 100 MG capsule  Commonly known as:  COLACE  Take 1 capsule (100 mg total) by mouth 2 (two) times daily.     ezetimibe 10 MG tablet  Commonly known as:  ZETIA  Take 1 tablet (10 mg total) by mouth daily. For cholesterol     ferrous sulfate 325 (65  FE) MG tablet  Take 1 tablet (325 mg total) by mouth 3 (three) times daily after meals.     loratadine 10 MG tablet  Commonly known as:  CLARITIN  Take 10 mg by mouth daily as needed for allergies.     meclizine 25 MG tablet  Commonly known as:  ANTIVERT  Take 1 tablet (25 mg total) by mouth 3 (three) times daily as needed for dizziness.  multivitamin with minerals Tabs tablet  Take 2 tablets by mouth daily. Gummies.     NUCYNTA 100 MG Tabs  Generic drug:  Tapentadol HCl  Take 1 tablet by mouth. Four to six times daily as needed for pain.     polyethylene glycol packet  Commonly known as:  MIRALAX / GLYCOLAX  Take 17 g by mouth 2 (two) times daily.     potassium chloride 10 MEQ tablet  Commonly known as:  K-DUR  TAKE 2 TO 3 TABLETS DAILY AS DIRECTED     topiramate 25 MG tablet  Commonly known as:  TOPAMAX  Take 75 mg by mouth at bedtime.     triamterene-hydrochlorothiazide 37.5-25 MG tablet  Commonly known as:  MAXZIDE-25  Take 1 tablet by mouth daily.         Signed: Anastasio Auerbach. Joy Haegele   PA-C  02/01/2015, 7:37 AM

## 2015-02-02 DIAGNOSIS — G609 Hereditary and idiopathic neuropathy, unspecified: Secondary | ICD-10-CM | POA: Diagnosis not present

## 2015-02-02 DIAGNOSIS — I10 Essential (primary) hypertension: Secondary | ICD-10-CM | POA: Diagnosis not present

## 2015-02-02 DIAGNOSIS — H8110 Benign paroxysmal vertigo, unspecified ear: Secondary | ICD-10-CM | POA: Diagnosis not present

## 2015-02-02 DIAGNOSIS — Z96651 Presence of right artificial knee joint: Secondary | ICD-10-CM | POA: Diagnosis not present

## 2015-02-02 DIAGNOSIS — Z87891 Personal history of nicotine dependence: Secondary | ICD-10-CM | POA: Diagnosis not present

## 2015-02-02 DIAGNOSIS — Z471 Aftercare following joint replacement surgery: Secondary | ICD-10-CM | POA: Diagnosis not present

## 2015-02-03 DIAGNOSIS — I10 Essential (primary) hypertension: Secondary | ICD-10-CM | POA: Diagnosis not present

## 2015-02-03 DIAGNOSIS — G609 Hereditary and idiopathic neuropathy, unspecified: Secondary | ICD-10-CM | POA: Diagnosis not present

## 2015-02-03 DIAGNOSIS — Z87891 Personal history of nicotine dependence: Secondary | ICD-10-CM | POA: Diagnosis not present

## 2015-02-03 DIAGNOSIS — Z471 Aftercare following joint replacement surgery: Secondary | ICD-10-CM | POA: Diagnosis not present

## 2015-02-03 DIAGNOSIS — Z96651 Presence of right artificial knee joint: Secondary | ICD-10-CM | POA: Diagnosis not present

## 2015-02-03 DIAGNOSIS — H8110 Benign paroxysmal vertigo, unspecified ear: Secondary | ICD-10-CM | POA: Diagnosis not present

## 2015-02-04 ENCOUNTER — Ambulatory Visit: Payer: Medicare Other | Attending: Orthopedic Surgery | Admitting: Physical Therapy

## 2015-02-04 DIAGNOSIS — M1990 Unspecified osteoarthritis, unspecified site: Secondary | ICD-10-CM | POA: Diagnosis not present

## 2015-02-04 DIAGNOSIS — M47812 Spondylosis without myelopathy or radiculopathy, cervical region: Secondary | ICD-10-CM | POA: Diagnosis not present

## 2015-02-04 DIAGNOSIS — M25561 Pain in right knee: Secondary | ICD-10-CM | POA: Diagnosis not present

## 2015-02-04 DIAGNOSIS — M25661 Stiffness of right knee, not elsewhere classified: Secondary | ICD-10-CM

## 2015-02-04 DIAGNOSIS — M25461 Effusion, right knee: Secondary | ICD-10-CM | POA: Diagnosis not present

## 2015-02-04 DIAGNOSIS — M47816 Spondylosis without myelopathy or radiculopathy, lumbar region: Secondary | ICD-10-CM | POA: Diagnosis not present

## 2015-02-04 DIAGNOSIS — G609 Hereditary and idiopathic neuropathy, unspecified: Secondary | ICD-10-CM | POA: Diagnosis not present

## 2015-02-04 NOTE — Therapy (Signed)
[facility] [address] [place], [place], [address] Phone: [phone]   Fax:  [phone]  Physical Therapy Evaluation  Patient Details  Name: Brittany Mercer MRN: [id number] Date of Birth: December 16, 1945 Referring Provider: Durene Romans MD.  Encounter Date: 02/04/2015      PT End of Session - 02/04/15 1345    Visit Number 1   Number of Visits 12   Date for PT Re-Evaluation 03/25/15   PT Start Time 0116   PT Stop Time 0155   PT Time Calculation (min) 39 min      Past Medical History  Diagnosis Date  . Hypertension   . Hyperlipidemia   . Peripheral neuropathy (HCC)   . Headache   . Arthritis   . 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 Cesarean section      X2  . Tonsillectomy    . Knee surgery      right x2  . Shoulder surgery Left   . Abdominal hysterectomy      partial - 1 ovary removed/ 1 ovary remains  . Total knee arthroplasty  12/2014  . Total knee arthroplasty Right 01/25/2015    Procedure: RIGHT TOTAL KNEE ARTHROPLASTY;  Surgeon: Durene Romans, MD;  Location: [facility];  Service: Orthopedics;  Laterality: Right;    There were no vitals filed for this visit.  Visit Diagnosis:  Right knee pain - Plan: PT plan of care cert/re-cert  Knee swelling, right - Plan: PT plan of care cert/re-cert  Knee stiffness, right - Plan: PT plan of care cert/re-cert      Subjective Assessment - 02/04/15 1319    Subjective Knee swelled up a lot after being on feet a lot.   Limitations Walking   How long can you walk comfortably? 10-15 minutes.   Patient Stated Goals Get out of my right knee pain.   Currently in Pain? Yes   Pain Score 4    Pain Location Knee   Pain Orientation Right   Pain Descriptors / Indicators Aching   Pain Type Surgical pain   Pain Onset More than a month ago   Aggravating Factors  Increased up time.   Pain Relieving Factors pain  medications.            [facility] PT Assessment - 02/04/15 0001    Assessment   Medical Diagnosis Right total knee replacement.   Referring Provider Durene Romans MD.   Onset Date/Surgical Date --  01/25/15(surgery date).   Precautions   Precautions --  No ultrasound.   Restrictions   Weight Bearing Restrictions No   Balance Screen   Has the patient fallen in the past 6 months Yes   How many times? --  2   Has the patient had a decrease in activity level because of a fear of falling?  Yes   Is the patient reluctant to leave their home because of a fear of falling?  No   Home Environment   Living Environment Private residence   Prior Function   Level of Independence Independent   Observation/Other Assessments-Edema    Edema Circumferential   Circumferential Edema   Circumferential - Right 6 cms > on right than left.   ROM / Strength   AROM / PROM / Strength AROM;Strength   AROM   Overall AROM Comments -25 degrees of active extension and passive= -20 degrees with active  right knee flexion to 90 degrees and passive= 95 degrees.   Strength   Overall Strength Comments Right hip= 4/5 and right knee= 4/5.   Palpation   Palpation comment Diffuse anterior right knee pain.   Ambulation/Gait   Gait Comments Antalgic gait with a SW and right knee held in flexion.                   [facility] Adult PT Treatment/Exercise - 26-Feb-2015 0001    Modalities   Modalities Vasopneumatic   Cryotherapy   Cryotherapy Location --  Right knee.   Ultrasound   Ultrasound Location --  Right knee.   Vasopneumatic   Number Minutes Vasopneumatic  --  9 minutes.   Vasopnuematic Location  --  Right knee.   Vasopneumatic Pressure Medium                  PT Short Term Goals - 2015/02/26 1501    PT SHORT TERM GOAL #1   Title Ind with HEP.   Time 4   Period Weeks   Status New   PT SHORT TERM GOAL #2   Title Full active knee extension in order to normalize gait   Time 4   Period  Weeks   Status New           PT Long Term Goals - Feb 26, 2015 1502    PT LONG TERM GOAL #1   Title Active knee flexion to 115 degrees+ so the patient can perform functional tasks and do so with pain not > 2-3/10.   Time 4   Period Weeks   Status New   PT LONG TERM GOAL #2   Title Increase right knee strength to a solid 5/5 to provide good stability for accomplishment of functional activities   Time 4   Period Weeks   Status New   PT LONG TERM GOAL #3   Title Decrease edema to within 2 cms of non-affected side to assist with pain reduction and range of motion gains.   Time 4   Period Weeks   Status New   PT LONG TERM GOAL #4   Title Perform a reciprocating stair gait with one railing with pain not > 2-3/10.   Time 4   Period Weeks   Status New               Plan - 02-26-2015 1351    Clinical Impression Statement The patient underwent a right total kneereplacement 10 days ago.  She presents to the clinic today with a SW and her post-surgical dressing still intact.  Her resting pain-level is a 4/10 today but can rise to a 6-7/10 if up too long in which her knee will swell a lot.  She has a TED hose but states it is too hard to put on.   Pt will benefit from skilled therapeutic intervention in order to improve on the following deficits Pain;Decreased activity tolerance;Increased edema;Decreased range of motion   Rehab Potential Good   PT Frequency 3x / week   PT Duration 4 weeks   PT Treatment/Interventions Electrical Stimulation;Cryotherapy;Therapeutic activities;Therapeutic exercise;Patient/family education;Manual techniques;Passive range of motion;Vasopneumatic Device   PT Next Visit Plan TKA protocol.   Consulted and Agree with Plan of Care Patient          G-Codes - Feb 26, 2015 1521    Functional Assessment Tool Used FOTO.   Functional Limitation Mobility: Walking and moving around   Mobility: Walking and Moving Around Current Status [phone])  At least 60 percent but  less than 80 percent impaired, limited or restricted   Mobility: Walking and Moving Around Goal Status [id number]) At least 20 percent but less than 40 percent impaired, limited or restricted       Problem List Patient Active Problem List   Diagnosis Date Noted  . 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 Benign paroxysmal positional vertigo 02/09/2014  . 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 Essential hypertension, benign 07/17/2012  . Hyperlipidemia with target LDL less than 100 07/17/2012  . Migraines 07/17/2012  . Hereditary and idiopathic peripheral neuropathy 07/17/2012  . Hypokalemia 07/17/2012    Margarito Dehaas, [place] MPT 02/04/2015, 3:22 PM  [facility] [address] [place], [place], [address] Phone: [phone]   Fax:  [phone]  Name: Brittany Mercer MRN: [id number] Date of Birth: 23-Feb-1946

## 2015-02-04 NOTE — Therapy (Signed)
[facility] [place], [place], [address] Phone: [phone]   Fax:  [phone]  Physical Therapy Treatment  Patient Details  Name: Brittany Mercer MRN: [id number] Date of Birth: 1946/01/05 No Data Recorded  Encounter Date: 09/21/2014    Past Medical History  Diagnosis Date  . Hypertension   . Hyperlipidemia   . Peripheral neuropathy ([place])   . Headache   . Arthritis   . 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 Cesarean section      X2  . Tonsillectomy    . Knee surgery      right x2  . Shoulder surgery Left   . Abdominal hysterectomy      partial - 1 ovary removed/ 1 ovary remains  . Total knee arthroplasty  12/2014  . Total knee arthroplasty Right 01/25/2015    Procedure: RIGHT TOTAL KNEE ARTHROPLASTY;  Surgeon: Paralee Cancel, MD;  Location: [facility];  Service: Orthopedics;  Laterality: Right;    There were no vitals filed for this visit.  Visit Diagnosis:  Right knee pain  Knee stiffness, right      Subjective Assessment - 02/04/15 1319    Subjective Knee swelled up a lot after being on feet a lot.   Limitations Walking   How long can you walk comfortably? 10-15 minutes.   Patient Stated Goals Get out of my right knee pain.   Currently in Pain? Yes   Pain Score 4    Pain Location Knee   Pain Orientation Right   Pain Descriptors / Indicators Aching   Pain Type Surgical pain   Pain Onset More than a month ago   Aggravating Factors  Increased up time.   Pain Relieving Factors pain medications.                         [facility] Adult PT Treatment/Exercise - 02/04/15 0001    Cryotherapy   Cryotherapy Location --  Right knee.                  PT Short Term Goals - 09/03/14 1752    PT SHORT TERM GOAL #1   Title Ind with HEP.   Period Weeks   Status Achieved           PT Long Term Goals - 09/21/14 1327    PT LONG TERM GOAL #1   Title Active right knee extension to 0 degrees.   Time 6   Period Weeks   Status Not Met  AROM -8 degrees/ PROM -5 degrees   PT LONG TERM GOAL #2   Title Perform ADL's with right knee pain not > 3/10.   Time 6   Period Weeks   Status Achieved   PT LONG TERM GOAL #3   Title Sit 30 minutes then transition to standing with pain not > 3-4/10.   Time 6   Status Achieved               Problem List Patient Active Problem List   Diagnosis Date Noted  . 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 Benign paroxysmal positional vertigo 02/09/2014  . 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 Essential hypertension, benign 07/17/2012  . Hyperlipidemia with target LDL less than 100 07/17/2012  . Migraines 07/17/2012  . Hereditary and idiopathic peripheral neuropathy 07/17/2012  .  Hypokalemia 07/17/2012   PHYSICAL THERAPY DISCHARGE SUMMARY  Visits from Start of Care:   Current functional level related to goals / functional outcomes: Please see above.   Remaining deficits: LTG #1 unmet.   Education / Equipment: HEP. Plan: Patient agrees to discharge.  Patient goals were partially met. Patient is being discharged due to meeting the stated rehab goals.  ?????       Keylon Labelle, [place] MPT 02/04/2015, 1:43 PM  [facility] [address] [place], [place], [address] Phone: [phone]   Fax:  [phone]  Name: Brittany Mercer MRN: [id number] Date of Birth: 07/28/1945

## 2015-02-09 ENCOUNTER — Encounter: Payer: Medicare Other | Admitting: Physical Therapy

## 2015-02-10 DIAGNOSIS — Z96651 Presence of right artificial knee joint: Secondary | ICD-10-CM | POA: Diagnosis not present

## 2015-02-10 DIAGNOSIS — Z471 Aftercare following joint replacement surgery: Secondary | ICD-10-CM | POA: Diagnosis not present

## 2015-02-11 ENCOUNTER — Encounter: Payer: Self-pay | Admitting: *Deleted

## 2015-02-11 ENCOUNTER — Ambulatory Visit: Payer: Medicare Other | Admitting: *Deleted

## 2015-02-11 DIAGNOSIS — M25461 Effusion, right knee: Secondary | ICD-10-CM

## 2015-02-11 DIAGNOSIS — M25661 Stiffness of right knee, not elsewhere classified: Secondary | ICD-10-CM

## 2015-02-11 DIAGNOSIS — M25561 Pain in right knee: Secondary | ICD-10-CM

## 2015-02-11 NOTE — Therapy (Signed)
[facility] [place], [place], [address] Phone: [phone]   Fax:  [phone]  Physical Therapy Treatment  Patient Details  Name: Brittany Mercer MRN: [id number] Date of Birth: [birth year] Referring Provider: Paralee Cancel MD.  Encounter Date: 02/11/2015      PT End of Session - 02/11/15 1124    Visit Number 2   Number of Visits 12   Date for PT Re-Evaluation 03/25/15   PT Start Time 1115   PT Stop Time 1205   PT Time Calculation (min) 50 min      Past Medical History  Diagnosis Date  . Hypertension   . Hyperlipidemia   . Peripheral neuropathy ([place])   . Headache   . Arthritis   . 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 Cesarean section      X2  . Tonsillectomy    . Knee surgery      right x2  . Shoulder surgery Left   . Abdominal hysterectomy      partial - 1 ovary removed/ 1 ovary remains  . Total knee arthroplasty  12/2014  . Total knee arthroplasty Right 01/25/2015    Procedure: RIGHT TOTAL KNEE ARTHROPLASTY;  Surgeon: Paralee Cancel, MD;  Location: [facility];  Service: Orthopedics;  Laterality: Right;    There were no vitals filed for this visit.  Visit Diagnosis:  Right knee pain  Knee swelling, right  Knee stiffness, right      Subjective Assessment - 02/11/15 1123    Subjective Knee swelled up a lot after being on feet a lot. Rt knee doing better today. Went to MD and He says I have a little infection and he prescribed an antibiotic   Limitations Walking   How long can you sit comfortably? 20-25 minutes initial and up to 2 hours last week   How long can you walk comfortably? 10-15 minutes.   Patient Stated Goals Get out of my right knee pain.   Currently in Pain? Yes   Pain Score 3    Pain Location Knee   Pain Orientation Right   Pain Descriptors / Indicators Aching   Pain Type Surgical pain   Pain Onset More than a month ago    Aggravating Factors  Being up   Pain Relieving Factors pain meds                         [facility] Adult PT Treatment/Exercise - 02/11/15 0001    Knee/Hip Exercises: Aerobic   Stationary Bike Nustep L5  x 25min  seat 9   Knee/Hip Exercises: Standing   Other Standing Knee Exercises Church pews for Quad activation   Other Standing Knee Exercises WB in walker and performing Quad set   Knee/Hip Exercises: Seated   Long Arc Quad Strengthening;Right;3 sets;10 reps   Long Arc Quad Weight 2 lbs.   Knee/Hip Exercises: Supine   Quad Sets AROM;2 sets;10 reps  challenging   Straight Leg Raises 3 sets;10 reps   Knee/Hip Exercises: Sidelying   Hip ABduction AROM;Right;3 sets;10 reps   Manual Therapy   Manual Therapy Passive ROM;Soft tissue mobilization   Soft tissue mobilization R patellar mobs    Passive ROM PROM R knee into ext with gentle holds at end range while pt performs quad set  PT Short Term Goals - 02/04/15 1501    PT SHORT TERM GOAL #1   Title Ind with HEP.   Time 4   Period Weeks   Status New   PT SHORT TERM GOAL #2   Title Full active knee extension in order to normalize gait   Time 4   Period Weeks   Status New           PT Long Term Goals - 02/04/15 1502    PT LONG TERM GOAL #1   Title Active knee flexion to 115 degrees+ so the patient can perform functional tasks and do so with pain not > 2-3/10.   Time 4   Period Weeks   Status New   PT LONG TERM GOAL #2   Title Increase right knee strength to a solid 5/5 to provide good stability for accomplishment of functional activities   Time 4   Period Weeks   Status New   PT LONG TERM GOAL #3   Title Decrease edema to within 2 cms of non-affected side to assist with pain reduction and range of motion gains.   Time 4   Period Weeks   Status New   PT LONG TERM GOAL #4   Title Perform a reciprocating stair gait with one railing with pain not > 2-3/10.   Time 4   Period  Weeks   Status New               Plan - 02/11/15 1142    Clinical Impression Statement Pt did fairly well with exs, but is still having a hard time with quad activation. She did better after practicing WB and NWB acts. Flexion ROM looked good, but Ext was limited .   Pt will benefit from skilled therapeutic intervention in order to improve on the following deficits Pain;Decreased activity tolerance;Increased edema;Decreased range of motion   Rehab Potential Good   PT Frequency 3x / week   PT Duration 4 weeks   PT Treatment/Interventions Electrical Stimulation;Cryotherapy;Therapeutic activities;Therapeutic exercise;Patient/family education;Manual techniques;Passive range of motion;Vasopneumatic Device   PT Next Visit Plan TKA protocol.        Problem List Patient Active Problem List   Diagnosis Date Noted  . 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 Benign paroxysmal positional vertigo 02/09/2014  . 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 Essential hypertension, benign 07/17/2012  . Hyperlipidemia with target LDL less than 100 07/17/2012  . Migraines 07/17/2012  . Hereditary and idiopathic peripheral neuropathy 07/17/2012  . Hypokalemia 07/17/2012    RAMSEUR,CHRIS, PTA 02/11/2015, 12:17 PM  [facility] [place], [place], [address] Phone: [phone]   Fax:  [phone]  Name: Brittany Mercer MRN: [id number] Date of Birth: Feb 10, 1946

## 2015-02-15 ENCOUNTER — Encounter: Payer: Medicare Other | Admitting: Physical Therapy

## 2015-02-22 ENCOUNTER — Ambulatory Visit: Payer: Medicare Other | Admitting: Physical Therapy

## 2015-02-22 ENCOUNTER — Encounter: Payer: Self-pay | Admitting: Physical Therapy

## 2015-02-22 DIAGNOSIS — M25661 Stiffness of right knee, not elsewhere classified: Secondary | ICD-10-CM | POA: Diagnosis not present

## 2015-02-22 DIAGNOSIS — M25461 Effusion, right knee: Secondary | ICD-10-CM

## 2015-02-22 DIAGNOSIS — M25561 Pain in right knee: Secondary | ICD-10-CM | POA: Diagnosis not present

## 2015-02-22 NOTE — Therapy (Signed)
[facility] [place], [place], [address] Phone: [phone]   Fax:  [phone]  Physical Therapy Treatment  Patient Details  Name: Brittany Mercer MRN: [id number] Date of Birth: 04-30-1945 Referring Provider: Paralee Cancel MD.  Encounter Date: 02/22/2015      PT End of Session - 02/22/15 1308    Visit Number 3   Number of Visits 12   Date for PT Re-Evaluation 03/25/15   PT Start Time [id number]   PT Stop Time [id number]  Patient requested end of vasopneumatic system after 7 minutes.   PT Time Calculation (min) 41 min   Activity Tolerance Patient tolerated treatment well   Behavior During Therapy WFL for tasks assessed/performed      Past Medical History  Diagnosis Date  . Hypertension   . Hyperlipidemia   . Peripheral neuropathy ([place])   . Headache   . Arthritis   . 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 Cesarean section      X2  . Tonsillectomy    . Knee surgery      right x2  . Shoulder surgery Left   . Abdominal hysterectomy      partial - 1 ovary removed/ 1 ovary remains  . Total knee arthroplasty  12/2014  . Total knee arthroplasty Right 01/25/2015    Procedure: RIGHT TOTAL KNEE ARTHROPLASTY;  Surgeon: Paralee Cancel, MD;  Location: [facility];  Service: Orthopedics;  Laterality: Right;    There were no vitals filed for this visit.  Visit Diagnosis:  Right knee pain  Knee swelling, right  Knee stiffness, right      Subjective Assessment - 02/22/15 1306    Subjective Reports that knee feels pretty good today. Was sick last week and didn't do much exercise. States that she has finished the antibiotic given by MD and states that it still feels hot but not as bad.   Limitations Walking   How long can you sit comfortably? 20-25 minutes initial and up to 2 hours last week   How long can you walk comfortably? 10-15 minutes.   Patient Stated Goals Get  out of my right knee pain.   Currently in Pain? Yes   Pain Score 3    Pain Location Knee   Pain Orientation Right   Pain Descriptors / Indicators Sore   Pain Type Surgical pain   Pain Onset More than a month ago            [facility] PT Assessment - 02/22/15 0001    Assessment   Medical Diagnosis Right total knee replacement.   Onset Date/Surgical Date 01/25/15   Next MD Visit 03/11/2015   ROM / Strength   AROM / PROM / Strength AROM   AROM   Overall AROM  Deficits   AROM Assessment Site Knee   Right/Left Knee Right   Right Knee Extension -16  -19 deg with rest   Right Knee Flexion 114                     [facility] Adult PT Treatment/Exercise - 02/22/15 0001    Exercises   Exercises Knee/Hip   Knee/Hip Exercises: Aerobic   Nustep L5 x12 min   Knee/Hip Exercises: Standing   Terminal Knee Extension Strengthening;Right;2 sets;10 reps;Theraband   Theraband Level (Terminal Knee Extension) Level 1 (Yellow)   Other Standing Knee Exercises Church  pews for Quad activation x30 reps   Knee/Hip Exercises: Supine   Short Arc Quad Sets Strengthening;Right;2 sets;10 reps;Other (comment)  3 sec hold   Short Arc Quad Sets Limitations 2#   Straight Leg Raises AROM;Right;3 sets;10 reps   Modalities   Modalities Vasopneumatic   Vasopneumatic   Number Minutes Vasopneumatic  7 minutes   Vasopnuematic Location  Knee   Vasopneumatic Pressure Medium   Vasopneumatic Temperature  34   Manual Therapy   Manual Therapy Passive ROM   Passive ROM PROM of R knee into flex/ext with gentle holds at end range and oscillations to promote relaxation                PT Education - 02/22/15 1347    Education provided Yes   Education Details HEP- prone hang    Person(s) Educated Patient   Methods Explanation;Verbal cues;Handout   Comprehension Verbalized understanding;Verbal cues required          PT Short Term Goals - 02/04/15 1501    PT SHORT TERM GOAL #1   Title Ind with  HEP.   Time 4   Period Weeks   Status New   PT SHORT TERM GOAL #2   Title Full active knee extension in order to normalize gait   Time 4   Period Weeks   Status New           PT Long Term Goals - 02/04/15 1502    PT LONG TERM GOAL #1   Title Active knee flexion to 115 degrees+ so the patient can perform functional tasks and do so with pain not > 2-3/10.   Time 4   Period Weeks   Status New   PT LONG TERM GOAL #2   Title Increase right knee strength to a solid 5/5 to provide good stability for accomplishment of functional activities   Time 4   Period Weeks   Status New   PT LONG TERM GOAL #3   Title Decrease edema to within 2 cms of non-affected side to assist with pain reduction and range of motion gains.   Time 4   Period Weeks   Status New   PT LONG TERM GOAL #4   Title Perform a reciprocating stair gait with one railing with pain not > 2-3/10.   Time 4   Period Weeks   Status New               Plan - 02/22/15 1341    Clinical Impression Statement Patient tolerated today's treatment fairly well although she continues to have difficulty with R Quad contraction and pain with TKE and church pews. Church pews and TKE with yellow theraband were completed today in efforts to increase R Quad contraction although very minimal contraction was noted during TKE but pain was reported with both exericses. SAQ with 2# ankle weight was utilized to improve R Quad contraction and a better R Quad contraction was noted. Ambulated into therapy gym with [facility] but ambulated around therapy gym without an assistive device. R knee temperature slightly increased compared to L knee and minimal redness was observed around inferior R incision. AROM of R knee measured as 16-114 deg in supine. Requested only ice today and only completed 7 minutes of vasopneumatic per patient request. Denied R knee pain following today's treatment and verbalized understanding of HEP for prone hang   Pt will benefit  from skilled therapeutic intervention in order to improve on the following deficits Pain;Decreased activity tolerance;Increased  edema;Decreased range of motion   Rehab Potential Good   PT Frequency 3x / week   PT Duration 4 weeks   PT Treatment/Interventions Electrical Stimulation;Cryotherapy;Therapeutic activities;Therapeutic exercise;Patient/family education;Manual techniques;Passive range of motion;Vasopneumatic Device   PT Next Visit Plan TKA protocol.   Consulted and Agree with Plan of Care Patient        Problem List Patient Active Problem List   Diagnosis Date Noted  . 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 Benign paroxysmal positional vertigo 02/09/2014  . 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 Essential hypertension, benign 07/17/2012  . Hyperlipidemia with target LDL less than 100 07/17/2012  . Migraines 07/17/2012  . Hereditary and idiopathic peripheral neuropathy 07/17/2012  . Hypokalemia 07/17/2012    Wynelle Fanny, PTA 02/22/2015, 2:15 PM  [facility] [address] [place], [place], [address] Phone: [phone]   Fax:  [phone]  Name: Brittany Mercer MRN: [id number] Date of Birth: Sep 30, 1945

## 2015-02-22 NOTE — Patient Instructions (Signed)
Knee Extension Mobilization: Hang (Prone)    With table supporting thighs, place ____ pound weight on right ankle. Hold to tolerance but start around 3-5 minutes if possible. Repeat ____ times per set. Do __1__ sets per session. Do _3___ sessions per day.  http://orth.exer.us/722   Copyright  VHI. All rights reserved.

## 2015-02-26 ENCOUNTER — Ambulatory Visit: Payer: Medicare Other | Attending: Orthopedic Surgery | Admitting: Physical Therapy

## 2015-02-26 DIAGNOSIS — M25661 Stiffness of right knee, not elsewhere classified: Secondary | ICD-10-CM | POA: Insufficient documentation

## 2015-02-26 DIAGNOSIS — M25561 Pain in right knee: Secondary | ICD-10-CM | POA: Diagnosis not present

## 2015-02-26 DIAGNOSIS — M25461 Effusion, right knee: Secondary | ICD-10-CM | POA: Diagnosis not present

## 2015-02-26 NOTE — Therapy (Signed)
[facility] [place], [place], [address] Phone: [phone]   Fax:  [phone]  Physical Therapy Treatment  Patient Details  Name: Brittany Mercer MRN: [id number] Date of Birth: 1946-02-06 Referring Provider: Paralee Cancel MD.  Encounter Date: 02/26/2015      PT End of Session - 02/26/15 0954    Visit Number 4   Number of Visits 12   Date for PT Re-Evaluation 03/25/15   PT Start Time 0953   PT Stop Time 1033   PT Time Calculation (min) 40 min   Activity Tolerance Patient limited by pain   Behavior During Therapy [facility] for tasks assessed/performed      Past Medical History  Diagnosis Date  . Hypertension   . Hyperlipidemia   . Peripheral neuropathy ([place])   . Headache   . Arthritis   . 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 Cesarean section      X2  . Tonsillectomy    . Knee surgery      right x2  . Shoulder surgery Left   . Abdominal hysterectomy      partial - 1 ovary removed/ 1 ovary remains  . Total knee arthroplasty  12/2014  . Total knee arthroplasty Right 01/25/2015    Procedure: RIGHT TOTAL KNEE ARTHROPLASTY;  Surgeon: Paralee Cancel, MD;  Location: [facility];  Service: Orthopedics;  Laterality: Right;    There were no vitals filed for this visit.  Visit Diagnosis:  Right knee pain  Knee stiffness, right      Subjective Assessment - 02/26/15 0954    Subjective Patient states that since 02/22/15 she has had a significant increase in pain in the R knee. She had no pain when she left PT, but 3-4 hours later pain started and has been bad ever since. She can't think of anything she has doen to hurt it.   Patient Stated Goals Get out of my right knee pain.   Currently in Pain? Yes   Pain Score 5    Pain Location Knee   Pain Orientation Right   Pain Descriptors / Indicators Sore   Pain Type Surgical pain   Pain Onset More than a month ago    Aggravating Factors  everything   Pain Relieving Factors ibuprofen but not sure if I can take it yet            [facility] PT Assessment - 02/26/15 0001    Assessment   Medical Diagnosis Right total knee replacement.   Onset Date/Surgical Date 01/25/15   Next MD Visit 03/11/2015                     [facility] Adult PT Treatment/Exercise - 02/26/15 0001    Knee/Hip Exercises: Aerobic   Nustep L5 x10 min   Knee/Hip Exercises: Supine   Quad Sets Strengthening;10 reps;2 sets  5 sec hold   Quad Sets Limitations required VCs, ball to do add squeeze and tactile cues as well; had to stop due to pain   Modalities   Modalities Electrical Stimulation;Cryotherapy   Cryotherapy   Number Minutes Cryotherapy 15 Minutes   Cryotherapy Location Knee   Type of Cryotherapy Ice pack   Electrical Stimulation   Electrical Stimulation Location R knee   Electrical Stimulation Action premod   Electrical Stimulation Parameters 80-150 Hz to tolerance x 15 min   Electrical  Stimulation Goals Pain   Manual Therapy   Manual Therapy Passive ROM   Manual therapy comments R patellar mobs   Passive ROM attemped ROM after patellar mobs, but pt unable to tolerate                  PT Short Term Goals - 02/04/15 1501    PT SHORT TERM GOAL #1   Title Ind with HEP.   Time 4   Period Weeks   Status New   PT SHORT TERM GOAL #2   Title Full active knee extension in order to normalize gait   Time 4   Period Weeks   Status New           PT Long Term Goals - 02/04/15 1502    PT LONG TERM GOAL #1   Title Active knee flexion to 115 degrees+ so the patient can perform functional tasks and do so with pain not > 2-3/10.   Time 4   Period Weeks   Status New   PT LONG TERM GOAL #2   Title Increase right knee strength to a solid 5/5 to provide good stability for accomplishment of functional activities   Time 4   Period Weeks   Status New   PT LONG TERM GOAL #3   Title Decrease edema to  within 2 cms of non-affected side to assist with pain reduction and range of motion gains.   Time 4   Period Weeks   Status New   PT LONG TERM GOAL #4   Title Perform a reciprocating stair gait with one railing with pain not > 2-3/10.   Time 4   Period Weeks   Status New               Plan - 02/26/15 1023    Clinical Impression Statement Patient presented today with increased pain in R keee beginning 3-4 hours after therapy visit on Monday. She was able to tolerate Nustep for ROM but had increased pain with quad sets and quad sets with Adduction as well as with attmempted manual therapy. She responded well to estim and ice decreasing pain to 3/10. She had no signs of infection in knee, but PT advised Pt to monitor temperature and knee. Discussed with patient that this is likely normal knee pain consistent with TKR and that we may have just pushed the tissues a little hard last visit.   PT Next Visit Plan TKA protocol. Continue modalities to manage pain as needed.   Consulted and Agree with Plan of Care Patient        Problem List Patient Active Problem List   Diagnosis Date Noted  . 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 Benign paroxysmal positional vertigo 02/09/2014  . 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 Essential hypertension, benign 07/17/2012  . Hyperlipidemia with target LDL less than 100 07/17/2012  . Migraines 07/17/2012  . Hereditary and idiopathic peripheral neuropathy 07/17/2012  . Hypokalemia 07/17/2012    [name] PT  02/26/2015, 11:12 AM  [facility] [address] [place], [place], [address] Phone: [phone]   Fax:  [phone]  Name: Brittany Mercer MRN: [id number] Date of Birth: 11-17-1945

## 2015-03-01 ENCOUNTER — Encounter: Payer: Medicare Other | Admitting: Physical Therapy

## 2015-03-04 ENCOUNTER — Ambulatory Visit: Payer: Medicare Other | Admitting: Adult Health

## 2015-03-04 ENCOUNTER — Encounter: Payer: Self-pay | Admitting: Physical Therapy

## 2015-03-04 ENCOUNTER — Ambulatory Visit: Payer: Medicare Other | Admitting: Physical Therapy

## 2015-03-04 DIAGNOSIS — M25561 Pain in right knee: Secondary | ICD-10-CM | POA: Diagnosis not present

## 2015-03-04 DIAGNOSIS — M25461 Effusion, right knee: Secondary | ICD-10-CM | POA: Diagnosis not present

## 2015-03-04 DIAGNOSIS — M25661 Stiffness of right knee, not elsewhere classified: Secondary | ICD-10-CM

## 2015-03-04 NOTE — Therapy (Signed)
[facility] [place], [place], [address] Phone: [phone]   Fax:  [phone]  Physical Therapy Treatment  Patient Details  Name: Brittany Mercer MRN: [id number] Date of Birth: April 08, 1945 Referring Provider: Paralee Cancel MD.  Encounter Date: 03/04/2015      PT End of Session - 03/04/15 0949    Visit Number 5   Number of Visits 12   Date for PT Re-Evaluation 03/25/15   PT Start Time 0948   PT Stop Time 1041   PT Time Calculation (min) 53 min   Activity Tolerance Patient limited by pain   Behavior During Therapy [facility] for tasks assessed/performed      Past Medical History  Diagnosis Date  . Hypertension   . Hyperlipidemia   . Peripheral neuropathy ([place])   . Headache   . Arthritis   . 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 Cesarean section      X2  . Tonsillectomy    . Knee surgery      right x2  . Shoulder surgery Left   . Abdominal hysterectomy      partial - 1 ovary removed/ 1 ovary remains  . Total knee arthroplasty  12/2014  . Total knee arthroplasty Right 01/25/2015    Procedure: RIGHT TOTAL KNEE ARTHROPLASTY;  Surgeon: Paralee Cancel, MD;  Location: [facility];  Service: Orthopedics;  Laterality: Right;    There were no vitals filed for this visit.  Visit Diagnosis:  Right knee pain  Knee stiffness, right  Knee swelling, right      Subjective Assessment - 03/04/15 0948    Subjective Reports that she is very sore in the superior and medial aspect of the R knee today.   Limitations Walking   How long can you sit comfortably? 20-25 minutes initial and up to 2 hours last week   How long can you walk comfortably? 10-15 minutes.   Patient Stated Goals Get out of my right knee pain.   Currently in Pain? Yes   Pain Score 4    Pain Location Knee   Pain Orientation Right;Upper;Medial   Pain Descriptors / Indicators Sore   Pain Type  Surgical pain   Pain Onset More than a month ago            [facility] PT Assessment - 03/04/15 0001    Assessment   Medical Diagnosis Right total knee replacement.   Onset Date/Surgical Date 01/25/15   Next MD Visit 03/11/2015                     [facility] Adult PT Treatment/Exercise - 03/04/15 0001    Knee/Hip Exercises: Aerobic   Nustep L4 x10 min   Knee/Hip Exercises: Supine   Quad Sets Strengthening;10 reps;2 sets;Other (comment)  x15 reps of QS with adduction   Short Arc Quad Sets Strengthening;Right;1 set;10 reps   Modalities   Modalities Designer, multimedia Location R knee   Printmaker Action Pre-Mod   Electrical Stimulation Parameters 80-150 Hz x15 min   Electrical Stimulation Goals Pain;Edema   Vasopneumatic   Number Minutes Vasopneumatic  10 minutes   Vasopnuematic Location  Knee   Vasopneumatic Pressure Medium   Vasopneumatic Temperature  50   Manual Therapy   Manual Therapy Passive ROM;Soft tissue mobilization   Soft tissue mobilization STW to R  HS in supine to decrease tightness   Passive ROM Passive R HS stretch in supine 3x30 sec                  PT Short Term Goals - 02/04/15 1501    PT SHORT TERM GOAL #1   Title Ind with HEP.   Time 4   Period Weeks   Status New   PT SHORT TERM GOAL #2   Title Full active knee extension in order to normalize gait   Time 4   Period Weeks   Status New           PT Long Term Goals - 02/04/15 1502    PT LONG TERM GOAL #1   Title Active knee flexion to 115 degrees+ so the patient can perform functional tasks and do so with pain not > 2-3/10.   Time 4   Period Weeks   Status New   PT LONG TERM GOAL #2   Title Increase right knee strength to a solid 5/5 to provide good stability for accomplishment of functional activities   Time 4   Period Weeks   Status New   PT LONG TERM GOAL #3   Title Decrease edema to  within 2 cms of non-affected side to assist with pain reduction and range of motion gains.   Time 4   Period Weeks   Status New   PT LONG TERM GOAL #4   Title Perform a reciprocating stair gait with one railing with pain not > 2-3/10.   Time 4   Period Weeks   Status New               Plan - 03/04/15 1036    Clinical Impression Statement Patient very limited today secondary to reported R knee pain today. Continues to present in clinic with R knee flexion in standing and during ambulation. Continues to have difficulty with R Quad contraction during the exercises completed today although fairly good R Quad contraction noted during R SAQ today. An area of redness was noted around the inferior aspect of the R knee incision that was circled per patient consent. Patient was educated to keep an eye on the redness and if the redness grew beyond circle to call MD. Temperature difference remains with R>L. Normal modalities response other than patient wishing for vasopneumatic system to be removed early due to coldness.   Pt will benefit from skilled therapeutic intervention in order to improve on the following deficits Pain;Decreased activity tolerance;Increased edema;Decreased range of motion   Rehab Potential Good   PT Frequency 3x / week   PT Duration 4 weeks   PT Treatment/Interventions Electrical Stimulation;Cryotherapy;Therapeutic activities;Therapeutic exercise;Patient/family education;Manual techniques;Passive range of motion;Vasopneumatic Device   PT Next Visit Plan TKA protocol. Continue modalities to manage pain as needed.   Consulted and Agree with Plan of Care Patient        Problem List Patient Active Problem List   Diagnosis Date Noted  . 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 Benign paroxysmal positional vertigo 02/09/2014  . 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 Essential hypertension, benign 07/17/2012  . Hyperlipidemia with target LDL less than 100  07/17/2012  . Migraines 07/17/2012  . Hereditary and idiopathic peripheral neuropathy 07/17/2012  . Hypokalemia 07/17/2012    Wynelle Fanny, PTA 03/04/2015, 10:45 AM  [facility] [address] [place], [place], [address] Phone: [phone]   Fax:  [phone]  Name: Brittany  Mercer MRN: [id number] Date of Birth: 1946/02/19

## 2015-03-09 ENCOUNTER — Ambulatory Visit: Payer: Medicare Other | Admitting: *Deleted

## 2015-03-09 DIAGNOSIS — M25661 Stiffness of right knee, not elsewhere classified: Secondary | ICD-10-CM

## 2015-03-09 DIAGNOSIS — M25561 Pain in right knee: Secondary | ICD-10-CM | POA: Diagnosis not present

## 2015-03-09 DIAGNOSIS — M25461 Effusion, right knee: Secondary | ICD-10-CM | POA: Diagnosis not present

## 2015-03-09 NOTE — Therapy (Signed)
[facility] [place], [place], [address] Phone: [phone]   Fax:  [phone]  Physical Therapy Treatment  Patient Details  Name: Brittany Mercer MRN: [id number] Date of Birth: 06/18/1945 Referring Provider: Paralee Cancel MD.  Encounter Date: 03/09/2015      PT End of Session - 03/09/15 1632    Visit Number 6   Number of Visits 12   Date for PT Re-Evaluation 03/25/15   PT Start Time 1600   PT Stop Time 1652   PT Time Calculation (min) 52 min   Behavior During Therapy [facility] for tasks assessed/performed      Past Medical History  Diagnosis Date  . Hypertension   . Hyperlipidemia   . Peripheral neuropathy ([place])   . Headache   . Arthritis   . 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 Cesarean section      X2  . Tonsillectomy    . Knee surgery      right x2  . Shoulder surgery Left   . Abdominal hysterectomy      partial - 1 ovary removed/ 1 ovary remains  . Total knee arthroplasty  12/2014  . Total knee arthroplasty Right 01/25/2015    Procedure: RIGHT TOTAL KNEE ARTHROPLASTY;  Surgeon: Paralee Cancel, MD;  Location: [facility];  Service: Orthopedics;  Laterality: Right;    There were no vitals filed for this visit.  Visit Diagnosis:  Right knee pain  Knee stiffness, right  Knee swelling, right      Subjective Assessment - 03/09/15 1527    Subjective Reports that she is very sore in the superior and medial aspect of the R knee today.   Limitations Walking   How long can you sit comfortably? 20-25 minutes initial and up to 2 hours last week   How long can you walk comfortably? 10-15 minutes.   Patient Stated Goals Get out of my right knee pain.   Currently in Pain? Yes   Pain Score 4    Pain Location Knee   Pain Orientation Right;Upper;Medial   Pain Descriptors / Indicators Sore   Pain Type Surgical pain   Pain Onset More than a month ago    Aggravating Factors  everything                         [facility] Adult PT Treatment/Exercise - 03/09/15 0001    Exercises   Exercises Knee/Hip   Knee/Hip Exercises: Aerobic   Nustep L4 x15 min seat 7   Electrical Stimulation   Electrical Stimulation Location R knee   Electrical Stimulation Action premod 80-150hz    Electrical Stimulation Goals Pain;Edema   Manual Therapy   Manual Therapy Passive ROM;Soft tissue mobilization   Manual therapy comments R patellar mobs   Soft tissue mobilization STW to R HS in prone to decrease tightness and with 2# cuff prolonged stretch   Passive ROM Passive R HS stretch in supine 3x30 sec                  PT Short Term Goals - 02/04/15 1501    PT SHORT TERM GOAL #1   Title Ind with HEP.   Time 4   Period Weeks   Status New   PT SHORT TERM GOAL #2   Title Full active knee extension in order to normalize gait  Time 4   Period Weeks   Status New           PT Long Term Goals - 02/04/15 1502    PT LONG TERM GOAL #1   Title Active knee flexion to 115 degrees+ so the patient can perform functional tasks and do so with pain not > 2-3/10.   Time 4   Period Weeks   Status New   PT LONG TERM GOAL #2   Title Increase right knee strength to a solid 5/5 to provide good stability for accomplishment of functional activities   Time 4   Period Weeks   Status New   PT LONG TERM GOAL #3   Title Decrease edema to within 2 cms of non-affected side to assist with pain reduction and range of motion gains.   Time 4   Period Weeks   Status New   PT LONG TERM GOAL #4   Title Perform a reciprocating stair gait with one railing with pain not > 2-3/10.   Time 4   Period Weeks   Status New               Plan - 03/09/15 1528    Pt will benefit from skilled therapeutic intervention in order to improve on the following deficits Pain;Decreased activity tolerance;Increased edema;Decreased range of motion   Rehab Potential  Good   PT Frequency 3x / week   PT Duration 4 weeks   PT Treatment/Interventions Electrical Stimulation;Cryotherapy;Therapeutic activities;Therapeutic exercise;Patient/family education;Manual techniques;Passive range of motion;Vasopneumatic Device   PT Next Visit Plan TKA protocol. Continue modalities to manage pain as needed.   Consulted and Agree with Plan of Care Patient        Problem List Patient Active Problem List   Diagnosis Date Noted  . 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 Benign paroxysmal positional vertigo 02/09/2014  . 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 Essential hypertension, benign 07/17/2012  . Hyperlipidemia with target LDL less than 100 07/17/2012  . Migraines 07/17/2012  . Hereditary and idiopathic peripheral neuropathy 07/17/2012  . Hypokalemia 07/17/2012    RAMSEUR,CHRIS, PTA 03/09/2015, 6:45 PM  [facility] [place], [place], [address] Phone: [phone]   Fax:  [phone]  Name: Brittany Mercer MRN: [id number] Date of Birth: Aug 15, 1945

## 2015-03-10 ENCOUNTER — Ambulatory Visit: Payer: Medicare Other | Admitting: Physical Therapy

## 2015-03-10 DIAGNOSIS — M25461 Effusion, right knee: Secondary | ICD-10-CM | POA: Diagnosis not present

## 2015-03-10 DIAGNOSIS — M25561 Pain in right knee: Secondary | ICD-10-CM

## 2015-03-10 DIAGNOSIS — M25661 Stiffness of right knee, not elsewhere classified: Secondary | ICD-10-CM | POA: Diagnosis not present

## 2015-03-10 NOTE — Therapy (Signed)
[facility] [address] [place], [place], [address] Phone: [phone]   Fax:  [phone]  Physical Therapy Treatment  Patient Details  Name: Almarosa Cahoon MRN: [id number] Date of Birth: 1945/08/29 Referring Provider: Durene Romans MD.  Encounter Date: 03/10/2015      PT End of Session - 03/10/15 1445    Visit Number 7   Number of Visits 12   Date for PT Re-Evaluation 03/25/15   PT Start Time 0102   PT Stop Time 0207   PT Time Calculation (min) 65 min   Activity Tolerance Patient limited by pain   Behavior During Therapy [facility] for tasks assessed/performed      Past Medical History  Diagnosis Date  . Hypertension   . Hyperlipidemia   . Peripheral neuropathy ([facility])   . Headache   . Arthritis   . 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 Cesarean section      X2  . Tonsillectomy    . Knee surgery      right x2  . Shoulder surgery Left   . Abdominal hysterectomy      partial - 1 ovary removed/ 1 ovary remains  . Total knee arthroplasty  12/2014  . Total knee arthroplasty Right 01/25/2015    Procedure: RIGHT TOTAL KNEE ARTHROPLASTY;  Surgeon: Durene Romans, MD;  Location: [facility];  Service: Orthopedics;  Laterality: Right;    There were no vitals filed for this visit.  Visit Diagnosis:  Right knee pain  Knee stiffness, right  Knee swelling, right      Subjective Assessment - 03/10/15 1440    Subjective Need to work on extension.   Pain Score 4    Pain Location Knee   Pain Orientation Right   Pain Descriptors / Indicators Sore   Pain Type Surgical pain   Pain Onset More than a month ago            [facility] PT Assessment - 03/10/15 0001    AROM   Right Knee Extension 17   Right Knee Flexion 115                     [facility] Adult PT Treatment/Exercise - 03/10/15 0001    Knee/Hip Exercises: Aerobic   Stationary Bike Nustep level 4  moving seat forward x 2 over 15 minutes.   Programme researcher, broadcasting/film/video Location --  Right knee.   Electrical Stimulation Action --  Pre-mod.   Electrical Stimulation Parameters --  80-150 HZ x 15 minutes.   Electrical Stimulation Goals Edema;Pain   Vasopneumatic   Number Minutes Vasopneumatic  --  15 minutes.   Vasopnuematic Location  --  Right knee.   Vasopneumatic Pressure Medium   Manual Therapy   Manual therapy comments Patient in prone hang while receiving STW/M to patient's right hamstrings f/b supine passive overpressure stretching and continued STW/M and flexion stretching x 23 minutes.                  PT Short Term Goals - 02/04/15 1501    PT SHORT TERM GOAL #1   Title Ind with HEP.   Time 4   Period Weeks   Status New   PT SHORT TERM GOAL #2   Title Full active knee extension in order to normalize gait   Time 4   Period Weeks  Status New           PT Long Term Goals - 02/04/15 1502    PT LONG TERM GOAL #1   Title Active knee flexion to 115 degrees+ so the patient can perform functional tasks and do so with pain not > 2-3/10.   Time 4   Period Weeks   Status New   PT LONG TERM GOAL #2   Title Increase right knee strength to a solid 5/5 to provide good stability for accomplishment of functional activities   Time 4   Period Weeks   Status New   PT LONG TERM GOAL #3   Title Decrease edema to within 2 cms of non-affected side to assist with pain reduction and range of motion gains.   Time 4   Period Weeks   Status New   PT LONG TERM GOAL #4   Title Perform a reciprocating stair gait with one railing with pain not > 2-3/10.   Time 4   Period Weeks   Status New               Plan - 03/10/15 1445    Clinical Impression Statement The patientis progressing well but she still has limited right knee extension.  We have been working hard on this during her PT sessions and she is stretching at home also.   Pt will  benefit from skilled therapeutic intervention in order to improve on the following deficits Pain;Decreased activity tolerance;Increased edema;Decreased range of motion   Rehab Potential Good   PT Frequency 3x / week   PT Duration 4 weeks   PT Treatment/Interventions Electrical Stimulation;Cryotherapy;Therapeutic activities;Therapeutic exercise;Patient/family education;Manual techniques;Passive range of motion;Vasopneumatic Device   PT Next Visit Plan TKA protocol. Continue modalities to manage pain as needed.        Problem List Patient Active Problem List   Diagnosis Date Noted  . 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 Benign paroxysmal positional vertigo 02/09/2014  . 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 Essential hypertension, benign 07/17/2012  . Hyperlipidemia with target LDL less than 100 07/17/2012  . Migraines 07/17/2012  . Hereditary and idiopathic peripheral neuropathy 07/17/2012  . Hypokalemia 07/17/2012    Yuridiana Formanek, [place] MPT 03/10/2015, 2:47 PM  [facility] [address] [place], [place], [address] Phone: [phone]   Fax:  [phone]  Name: Nabeela Roda MRN: [id number] Date of Birth: Jul 08, 1945

## 2015-03-11 ENCOUNTER — Encounter: Payer: Medicare Other | Admitting: *Deleted

## 2015-03-15 ENCOUNTER — Ambulatory Visit: Payer: Medicare Other | Admitting: Physical Therapy

## 2015-03-15 ENCOUNTER — Encounter: Payer: Self-pay | Admitting: Physical Therapy

## 2015-03-15 DIAGNOSIS — M25561 Pain in right knee: Secondary | ICD-10-CM

## 2015-03-15 DIAGNOSIS — M25461 Effusion, right knee: Secondary | ICD-10-CM

## 2015-03-15 DIAGNOSIS — M25661 Stiffness of right knee, not elsewhere classified: Secondary | ICD-10-CM

## 2015-03-15 NOTE — Therapy (Signed)
[facility] [place], [place], [address] Phone: [phone]   Fax:  [phone]  Physical Therapy Treatment  Patient Details  Name: Brittany Mercer MRN: [id number] Date of Birth: 01-Jun-1945 Referring Provider: Paralee Cancel MD.  Encounter Date: 03/15/2015      PT End of Session - 03/15/15 1316    Visit Number 8   Number of Visits 12   Date for PT Re-Evaluation 03/25/15   PT Start Time 1302   PT Stop Time 1400   PT Time Calculation (min) 58 min   Activity Tolerance Patient tolerated treatment well   Behavior During Therapy [facility] for tasks assessed/performed      Past Medical History  Diagnosis Date  . Hypertension   . Hyperlipidemia   . Peripheral neuropathy ([place])   . Headache   . Arthritis   . 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 Cesarean section      X2  . Tonsillectomy    . Knee surgery      right x2  . Shoulder surgery Left   . Abdominal hysterectomy      partial - 1 ovary removed/ 1 ovary remains  . Total knee arthroplasty  12/2014  . Total knee arthroplasty Right 01/25/2015    Procedure: RIGHT TOTAL KNEE ARTHROPLASTY;  Surgeon: Paralee Cancel, MD;  Location: [facility];  Service: Orthopedics;  Laterality: Right;    There were no vitals filed for this visit.  Visit Diagnosis:  Right knee pain  Knee stiffness, right  Knee swelling, right      Subjective Assessment - 03/15/15 1304    Subjective States that her knee hurt a lot Satruday and Sunday which was cold weather. States that her knee is "cranky" today.   Limitations Walking   How long can you sit comfortably? 20-25 minutes initial and up to 2 hours last week   How long can you walk comfortably? 10-15 minutes.   Patient Stated Goals Get out of my right knee pain.   Currently in Pain? Yes   Pain Score 3    Pain Location Knee   Pain Orientation Right   Pain Descriptors /  Indicators Sore   Pain Type Surgical pain   Pain Onset More than a month ago   Pain Frequency Constant            [facility] PT Assessment - 03/15/15 0001    Assessment   Medical Diagnosis Right total knee replacement.   Onset Date/Surgical Date 01/25/15   Next MD Visit 03/18/2015   Observation/Other Assessments-Edema    Edema Circumferential   Circumferential Edema   Circumferential - Right R knee 42.5 cm , L knee 39.1 cm   ROM / Strength   AROM / PROM / Strength AROM   AROM   Overall AROM  Deficits   AROM Assessment Site Knee   Right/Left Knee Right   Right Knee Extension 18   Right Knee Flexion 113                     [facility] Adult PT Treatment/Exercise - 03/15/15 0001    Knee/Hip Exercises: Aerobic   Nustep L5 x15 min   Knee/Hip Exercises: Supine   Quad Sets Other (comment)  Attempted but patient continues to have difficulty    Short Music therapist (comment)  with VMS to R VMO/Quad for [employer]  strengthening   Modalities   Modalities Designer, multimedia Location R VMO/Quad; R knee   Electrical Stimulation Action VMS; Pre-Mod   Electrical Stimulation Parameters 10/10, frequency 100 pps, x15 min; 80-150 Hz x15 min   Electrical Stimulation Goals Strength;Pain;Edema   Vasopneumatic   Number Minutes Vasopneumatic  15 minutes   Vasopnuematic Location  Knee   Vasopneumatic Pressure Medium                  PT Short Term Goals - 03/15/15 1357    PT SHORT TERM GOAL #1   Title Ind with HEP.   Time 4   Period Weeks   Status Achieved   PT SHORT TERM GOAL #2   Title Full active knee extension in order to normalize gait   Time 4   Period Weeks   Status On-going  AROM R knee ext -18 deg from neutral 03/15/2015           PT Long Term Goals - 03/15/15 1357    PT LONG TERM GOAL #1   Title Active knee flexion to 115 degrees+ so the patient can perform  functional tasks and do so with pain not > 2-3/10.   Time 4   Period Weeks   Status On-going  AROM R knee flexion 113 deg 03/15/2015   PT LONG TERM GOAL #2   Title Increase right knee strength to a solid 5/5 to provide good stability for accomplishment of functional activities   Time 4   Period Weeks   Status On-going   PT LONG TERM GOAL #3   Title Decrease edema to within 2 cms of non-affected side to assist with pain reduction and range of motion gains.   Time 4   Period Weeks   Status On-going  3.4 cm difference R>L 03/15/2015   PT LONG TERM GOAL #4   Title Perform a reciprocating stair gait with one railing with pain not > 2-3/10.   Time 4   Period Weeks   Status On-going               Plan - 03/15/15 1408    Clinical Impression Statement Patient continues to have difficulty with R Quad activation and control with R Quad exercises. Quad sets with adductor squeeze was attempted in several repititions but a controlled actvation was not achieved. VMS with SAQ was initiated today in supine to attempt to gain R Quad activation and control along with strength. No pain was experienced during VMS session per patient report. Measurements taken today included AROM R knee 18-113 deg and 3.4 cm difference in midpatellar edema with R>L. Normal modalities response noted following removal of the modalities. Denied R knee pain folllowing today's treatment.   Pt will benefit from skilled therapeutic intervention in order to improve on the following deficits Pain;Decreased activity tolerance;Increased edema;Decreased range of motion   Rehab Potential Good   PT Frequency 3x / week   PT Duration 4 weeks   PT Treatment/Interventions Electrical Stimulation;Cryotherapy;Therapeutic activities;Therapeutic exercise;Patient/family education;Manual techniques;Passive range of motion;Vasopneumatic Device   PT Next Visit Plan TKA protocol. Continue modalities to manage pain as needed.   Consulted and  Agree with Plan of Care Patient        Problem List Patient Active Problem List   Diagnosis Date Noted  . 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 Benign paroxysmal positional vertigo 02/09/2014  . 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 Essential  hypertension, benign 07/17/2012  . Hyperlipidemia with target LDL less than 100 07/17/2012  . Migraines 07/17/2012  . Hereditary and idiopathic peripheral neuropathy 07/17/2012  . Hypokalemia 07/17/2012    Wynelle Fanny, PTA 03/15/2015, 2:18 PM  [facility] [address] [place], [place], [address] Phone: [phone]   Fax:  [phone]  Name: Eibhlin Gornik MRN: [id number] Date of Birth: [birth year]

## 2015-03-16 ENCOUNTER — Ambulatory Visit: Payer: Medicare Other | Admitting: Physical Therapy

## 2015-03-16 DIAGNOSIS — M25561 Pain in right knee: Secondary | ICD-10-CM | POA: Diagnosis not present

## 2015-03-16 DIAGNOSIS — M25661 Stiffness of right knee, not elsewhere classified: Secondary | ICD-10-CM

## 2015-03-16 DIAGNOSIS — M25461 Effusion, right knee: Secondary | ICD-10-CM

## 2015-03-16 NOTE — Therapy (Signed)
[facility] [place], [place], [address] Phone: [phone]   Fax:  [phone]  Physical Therapy Treatment  Patient Details  Name: Brittany Mercer MRN: [id number] Date of Birth: 05-13-1945 Referring Provider: Paralee Cancel MD.  Encounter Date: 03/16/2015      PT End of Session - 03/16/15 1214    Visit Number 9   Number of Visits 12   Date for PT Re-Evaluation 03/25/15   PT Start Time 1116   PT Stop Time 1220   PT Time Calculation (min) 64 min   Activity Tolerance Patient tolerated treatment well   Behavior During Therapy [facility] for tasks assessed/performed      Past Medical History  Diagnosis Date  . Hypertension   . Hyperlipidemia   . Peripheral neuropathy ([place])   . Headache   . Arthritis   . 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 Cesarean section      X2  . Tonsillectomy    . Knee surgery      right x2  . Shoulder surgery Left   . Abdominal hysterectomy      partial - 1 ovary removed/ 1 ovary remains  . Total knee arthroplasty  12/2014  . Total knee arthroplasty Right 01/25/2015    Procedure: RIGHT TOTAL KNEE ARTHROPLASTY;  Surgeon: Paralee Cancel, MD;  [facility];  Service: Orthopedics;  Laterality: Right;    There were no vitals filed for this visit.  Visit Diagnosis:  Right knee pain  Knee stiffness, right  Knee swelling, right      Subjective Assessment - 03/16/15 1209    Subjective Sore from last treatment.   Pain Score 3    Pain Location Knee   Pain Orientation Right   Pain Descriptors / Indicators Sore   Pain Type Surgical pain   Pain Onset More than a month ago   Pain Frequency Constant                         [facility] Adult PT Treatment/Exercise - 03/16/15 0001    Knee/Hip Exercises: Aerobic   Stationary Bike level 4 x 15 minutes.   Knee/Hip Exercises: Supine   Short Arc Quad Sets Limitations VMS  to medical quadriceps while patient performed non-resisted SAQ's x 15 minutes (10 sec extension holds and 10 sec rest).   Acupuncturist Location --  Right knee.   Electrical Stimulation Action --  Pre-mod e'stim x 15 minutes.   Vasopneumatic   Number Minutes Vasopneumatic  15 minutes   Vasopnuematic Location  --  Right knee.   Vasopneumatic Pressure Medium   Manual Therapy   Passive ROM Prone hand x 5 minutes while patient received deep tissue STW/M to her right hamstring muscle group.                  PT Short Term Goals - 03/15/15 1357    PT SHORT TERM GOAL #1   Title Ind with HEP.   Time 4   Period Weeks   Status Achieved   PT SHORT TERM GOAL #2   Title Full active knee extension in order to normalize gait   Time 4   Period Weeks   Status On-going  AROM R knee ext -18 deg from neutral 03/15/2015           PT  Long Term Goals - 03/15/15 1357    PT LONG TERM GOAL #1   Title Active knee flexion to 115 degrees+ so the patient can perform functional tasks and do so with pain not > 2-3/10.   Time 4   Period Weeks   Status On-going  AROM R knee flexion 113 deg 03/15/2015   PT LONG TERM GOAL #2   Title Increase right knee strength to a solid 5/5 to provide good stability for accomplishment of functional activities   Time 4   Period Weeks   Status On-going   PT LONG TERM GOAL #3   Title Decrease edema to within 2 cms of non-affected side to assist with pain reduction and range of motion gains.   Time 4   Period Weeks   Status On-going  3.4 cm difference R>L 03/15/2015   PT LONG TERM GOAL #4   Title Perform a reciprocating stair gait with one railing with pain not > 2-3/10.   Time 4   Period Weeks   Status On-going               Plan - 03/15/15 1408    Clinical Impression Statement Patient continues to have difficulty with R Quad activation and control with R Quad exercises. Quad sets with adductor squeeze was  attempted in several repititions but a controlled actvation was not achieved. VMS with SAQ was initiated today in supine to attempt to gain R Quad activation and control along with strength. No pain was experienced during VMS session per patient report. Measurements taken today included AROM R knee 18-113 deg and 3.4 cm difference in midpatellar edema with R>L. Normal modalities response noted following removal of the modalities. Denied R knee pain folllowing today's treatment.   Pt will benefit from skilled therapeutic intervention in order to improve on the following deficits Pain;Decreased activity tolerance;Increased edema;Decreased range of motion   Rehab Potential Good   PT Frequency 3x / week   PT Duration 4 weeks   PT Treatment/Interventions Electrical Stimulation;Cryotherapy;Therapeutic activities;Therapeutic exercise;Patient/family education;Manual techniques;Passive range of motion;Vasopneumatic Device   PT Next Visit Plan TKA protocol. Continue modalities to manage pain as needed.   Consulted and Agree with Plan of Care Patient        Problem List Patient Active Problem List   Diagnosis Date Noted  . 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 Benign paroxysmal positional vertigo 02/09/2014  . 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 Essential hypertension, benign 07/17/2012  . Hyperlipidemia with target LDL less than 100 07/17/2012  . Migraines 07/17/2012  . Hereditary and idiopathic peripheral neuropathy 07/17/2012  . Hypokalemia 07/17/2012    APPLE[place]ATE, [place] MPT 03/16/2015, 12:26 PM  [facility] [address] [place], [place], [address] Phone: [phone]   Fax:  [phone]  Name: Brittany Mercer MRN: [id number] Date of Birth: [birth year]

## 2015-03-17 ENCOUNTER — Encounter: Payer: Medicare Other | Admitting: Physical Therapy

## 2015-03-18 DIAGNOSIS — Z471 Aftercare following joint replacement surgery: Secondary | ICD-10-CM | POA: Diagnosis not present

## 2015-03-18 DIAGNOSIS — Z79891 Long term (current) use of opiate analgesic: Secondary | ICD-10-CM | POA: Diagnosis not present

## 2015-03-18 DIAGNOSIS — Z96651 Presence of right artificial knee joint: Secondary | ICD-10-CM | POA: Diagnosis not present

## 2015-03-26 ENCOUNTER — Encounter: Payer: Self-pay | Admitting: Physical Therapy

## 2015-03-26 ENCOUNTER — Ambulatory Visit: Payer: Medicare Other | Admitting: Physical Therapy

## 2015-03-26 DIAGNOSIS — M25461 Effusion, right knee: Secondary | ICD-10-CM | POA: Diagnosis not present

## 2015-03-26 DIAGNOSIS — M25661 Stiffness of right knee, not elsewhere classified: Secondary | ICD-10-CM | POA: Diagnosis not present

## 2015-03-26 DIAGNOSIS — M25561 Pain in right knee: Secondary | ICD-10-CM

## 2015-03-26 NOTE — Therapy (Signed)
[facility] [place], [place], [address] Phone: [phone]   Fax:  [phone]  Physical Therapy Treatment  Patient Details  Name: Brittany Mercer MRN: [id number] Date of Birth: [birth year] Referring Provider: Paralee Cancel MD.  Encounter Date: 03/26/2015      PT End of Session - 03/26/15 1118    Visit Number 10   Number of Visits 12   Date for PT Re-Evaluation 03/25/15   PT Start Time 1117   PT Stop Time 1205   PT Time Calculation (min) 48 min   Activity Tolerance Patient tolerated treatment well   Behavior During Therapy [facility] for tasks assessed/performed      Past Medical History  Diagnosis Date  . Hypertension   . Hyperlipidemia   . Peripheral neuropathy ([place])   . Headache   . Arthritis   . 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 Cesarean section      X2  . Tonsillectomy    . Knee surgery      right x2  . Shoulder surgery Left   . Abdominal hysterectomy      partial - 1 ovary removed/ 1 ovary remains  . Total knee arthroplasty  12/2014  . Total knee arthroplasty Right 01/25/2015    Procedure: RIGHT TOTAL KNEE ARTHROPLASTY;  Surgeon: Paralee Cancel, MD;  Location: [facility];  Service: Orthopedics;  Laterality: Right;    There were no vitals filed for this visit.  Visit Diagnosis:  Right knee pain  Knee stiffness, right  Knee swelling, right      Subjective Assessment - 03/26/15 1118    Subjective Reports soreness but has been up on her feet a lot recently.   Limitations Walking   How long can you sit comfortably? 20-25 minutes initial and up to 2 hours last week   How long can you walk comfortably? 10-15 minutes.   Patient Stated Goals Get out of my right knee pain.            [facility] PT Assessment - 03/26/15 0001    Assessment   Medical Diagnosis Right total knee replacement.   Onset Date/Surgical Date 01/25/15   Observation/Other Assessments-Edema    Edema Circumferential   Circumferential Edema   Circumferential - Right 41.7   Circumferential - Left  40.7   ROM / Strength   AROM / PROM / Strength AROM   AROM   Overall AROM  Deficits   AROM Assessment Site Knee   Right/Left Knee Right   Right Knee Extension 17   Right Knee Flexion 116                     [facility] Adult PT Treatment/Exercise - 03/26/15 0001    Knee/Hip Exercises: Aerobic   Nustep L5 x15 min   Knee/Hip Exercises: Supine   Short Arc Quad Sets Strengthening;Right;Other (comment)  VMS to R VMO/Quad to improve strength and neuromuscular reed   Acupuncturist Location R VMO/Quad   Electrical Stimulation Action VMS   Electrical Stimulation Parameters 10/10, 100 pps frequency x15 min with SAQ   Electrical Stimulation Goals Strength   Manual Therapy   Manual Therapy Passive ROM;Myofascial release   Myofascial Release MFR to R Hamstrings to decrease tightness   Passive ROM PROM of R knee into flex/ext with gentle holds at end range  PT Short Term Goals - 03/26/15 1211    PT SHORT TERM GOAL #1   Title Ind with HEP.   Time 4   Period Weeks   Status Achieved   PT SHORT TERM GOAL #2   Title Full active knee extension in order to normalize gait   Time 4   Period Weeks   Status On-going  AROM R knee ext -17 deg from neutral 03/15/2015           PT Long Term Goals - 03/26/15 1203    PT LONG TERM GOAL #1   Title Active knee flexion to 115 degrees+ so the patient can perform functional tasks and do so with pain not > 2-3/10.   Time 4   Period Weeks   Status Achieved  AROM R knee flexion 116 deg 03/26/2015   PT LONG TERM GOAL #2   Title Increase right knee strength to a solid 5/5 to provide good stability for accomplishment of functional activities   Time 4   Period Weeks   Status On-going   PT LONG TERM GOAL #3   Title Decrease edema to within 2  cms of non-affected side to assist with pain reduction and range of motion gains.   Time 4   Period Weeks   Status Achieved  1 cm difference R>L 03/26/2015   PT LONG TERM GOAL #4   Title Perform a reciprocating stair gait with one railing with pain not > 2-3/10.   Time 4   Period Weeks   Status On-going               Plan - 03/26/15 1212    Clinical Impression Statement Patient continues to have difficulty with R knee extension and R quad activation. Achieved R knee flexion ROM goal with 116 deg and B edema measurements of 1 cm difference with R>L. Very firm end feel noted in R knee extension during PROM of R knee into extension. VMS continued to R Quad/VMO to encourage R Quad strengthening and imrpove R quad control. Normal VMS response noted folowing end of VMS session in supine. Patient denied cryotherapy following today's treatment. Patient has difficulty with stair ambulation per patient report.   Pt will benefit from skilled therapeutic intervention in order to improve on the following deficits Pain;Decreased activity tolerance;Increased edema;Decreased range of motion   Rehab Potential Good   PT Frequency 3x / week   PT Duration 4 weeks   PT Treatment/Interventions Electrical Stimulation;Cryotherapy;Therapeutic activities;Therapeutic exercise;Patient/family education;Manual techniques;Passive range of motion;Vasopneumatic Device   PT Next Visit Plan TKA protocol. Continue modalities to manage pain as needed.   Consulted and Agree with Plan of Care Patient        Problem List Patient Active Problem List   Diagnosis Date Noted  . 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 Benign paroxysmal positional vertigo 02/09/2014  . 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 Essential hypertension, benign 07/17/2012  . Hyperlipidemia with target LDL less than 100 07/17/2012  . Migraines 07/17/2012  . Hereditary and idiopathic peripheral neuropathy 07/17/2012  . Hypokalemia  07/17/2012    Ahmed Prima, PTA 03/26/2015 12:18 PM  [facility] [place], [place], [address] Phone: [phone]   Fax:  [phone]  Name: Brittany Mercer MRN: [id number] Date of Birth: 04-Nov-1945

## 2015-04-01 ENCOUNTER — Ambulatory Visit: Payer: Medicare Other | Attending: Orthopedic Surgery | Admitting: *Deleted

## 2015-04-01 DIAGNOSIS — M25661 Stiffness of right knee, not elsewhere classified: Secondary | ICD-10-CM | POA: Diagnosis not present

## 2015-04-01 DIAGNOSIS — Z96651 Presence of right artificial knee joint: Secondary | ICD-10-CM | POA: Diagnosis not present

## 2015-04-01 DIAGNOSIS — M25461 Effusion, right knee: Secondary | ICD-10-CM | POA: Diagnosis not present

## 2015-04-01 DIAGNOSIS — M25561 Pain in right knee: Secondary | ICD-10-CM | POA: Insufficient documentation

## 2015-04-01 DIAGNOSIS — Z471 Aftercare following joint replacement surgery: Secondary | ICD-10-CM | POA: Diagnosis not present

## 2015-04-01 NOTE — Therapy (Signed)
[facility] [address] [place], [place], [address] Phone: [phone]   Fax:  [phone]  Physical Therapy Treatment  Patient Details  Name: Brittany Mercer MRN: [id number] Date of Birth: 25-Sep-1945 Referring Provider: Durene Romans MD.  Encounter Date: 04/01/2015      PT End of Session - 04/01/15 0918    Visit Number 11   Number of Visits 12   Date for PT Re-Evaluation 04/06/15   PT Start Time 0900   PT Stop Time 0949   PT Time Calculation (min) 49 min      Past Medical History  Diagnosis Date  . Hypertension   . Hyperlipidemia   . Peripheral neuropathy ([facility])   . Headache   . Arthritis   . 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 Cesarean section      X2  . Tonsillectomy    . Knee surgery      right x2  . Shoulder surgery Left   . Abdominal hysterectomy      partial - 1 ovary removed/ 1 ovary remains  . Total knee arthroplasty  12/2014  . Total knee arthroplasty Right 01/25/2015    Procedure: RIGHT TOTAL KNEE ARTHROPLASTY;  Surgeon: Durene Romans, MD;  Location: [facility];  Service: Orthopedics;  Laterality: Right;    There were no vitals filed for this visit.  Visit Diagnosis:  Right knee pain  Knee stiffness, right  Knee swelling, right      Subjective Assessment - 04/01/15 0916    Subjective Reports soreness but has been up on her feet a lot recently. To MD today and will probably get JAS brace for ext.   Limitations Walking   How long can you sit comfortably? 20-25 minutes initial and up to 2 hours last week   How long can you walk comfortably? 10-15 minutes.   Patient Stated Goals Get out of my right knee pain.   Currently in Pain? Yes   Pain Score 3    Pain Location Knee   Pain Orientation Right   Pain Type Surgical pain   Pain Onset More than a month ago   Pain Frequency Constant   Aggravating Factors  everything             [facility] PT Assessment - 04/01/15 0001    AROM   Overall AROM  Deficits   AROM Assessment Site Knee   Right/Left Knee Right   PROM   Overall PROM  Deficits   Right/Left Knee Right   Right Knee Extension -14   Right Knee Flexion 115                     [facility] Adult PT Treatment/Exercise - 04/01/15 0001    Exercises   Exercises Knee/Hip   Knee/Hip Exercises: Aerobic   Nustep L5 x15 min   Knee/Hip Exercises: Standing   Rocker Board 5 minutes  calf stretching   Manual Therapy   Manual Therapy Passive ROM;Myofascial release   Manual therapy comments Patient in prone hang while receiving STW/M to patient's right hamstrings f/b supine passive overpressure stretching and continued STW/M and flexion stretching    Soft tissue mobilization STW/ IASTW to R HS in prone to decrease tightness and with 2# cuff prolonged stretch   Myofascial Release --   Passive ROM PROM of R knee into flex/ext with gentle holds at end  range                  PT Short Term Goals - 03/26/15 1211    PT SHORT TERM GOAL #1   Title Ind with HEP.   Time 4   Period Weeks   Status Achieved   PT SHORT TERM GOAL #2   Title Full active knee extension in order to normalize gait   Time 4   Period Weeks   Status On-going  AROM R knee ext -17 deg from neutral 03/15/2015           PT Long Term Goals - 03/26/15 1203    PT LONG TERM GOAL #1   Title Active knee flexion to 115 degrees+ so the patient can perform functional tasks and do so with pain not > 2-3/10.   Time 4   Period Weeks   Status Achieved  AROM R knee flexion 116 deg 03/26/2015   PT LONG TERM GOAL #2   Title Increase right knee strength to a solid 5/5 to provide good stability for accomplishment of functional activities   Time 4   Period Weeks   Status On-going   PT LONG TERM GOAL #3   Title Decrease edema to within 2 cms of non-affected side to assist with pain reduction and range of motion gains.   Time 4   Period Weeks    Status Achieved  1 cm difference R>L 03/26/2015   PT LONG TERM GOAL #4   Title Perform a reciprocating stair gait with one railing with pain not > 2-3/10.   Time 4   Period Weeks   Status On-going               Plan - 04/01/15 1002    Clinical Impression Statement Pt did fairly well today with Rx, but continues to have ROM deficits with extension (-14). Her ROM for Flexion was 115 degrees today and continues to improve. She still has notable tightness i Posterior aspect of RT knee as well. Current Goals are Ongoing   Pt will benefit from skilled therapeutic intervention in order to improve on the following deficits Pain;Decreased activity tolerance;Increased edema;Decreased range of motion   Rehab Potential Good   PT Frequency 3x / week   PT Duration 4 weeks   PT Treatment/Interventions Electrical Stimulation;Cryotherapy;Therapeutic activities;Therapeutic exercise;Patient/family education;Manual techniques;Passive range of motion;Vasopneumatic Device   PT Next Visit Plan TKA protocol. Continue modalities to manage pain as needed.   Send MD note before 3:00 today   Consulted and Agree with Plan of Care Patient        Problem List Patient Active Problem List   Diagnosis Date Noted  . 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 Benign paroxysmal positional vertigo 02/09/2014  . 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 Essential hypertension, benign 07/17/2012  . Hyperlipidemia with target LDL less than 100 07/17/2012  . Migraines 07/17/2012  . Hereditary and idiopathic peripheral neuropathy 07/17/2012  . Hypokalemia 07/17/2012    [name],CHRIS, PTA 04/01/2015, 2:48 PM  [facility] [address] [place], [place], [address] Phone: [phone]   Fax:  [phone]  Name: Brittany Mercer MRN: [id number] Date of Birth: [birth year]

## 2015-04-02 ENCOUNTER — Other Ambulatory Visit: Payer: Self-pay | Admitting: Family Medicine

## 2015-04-02 ENCOUNTER — Ambulatory Visit: Payer: Medicare Other | Admitting: Physical Therapy

## 2015-04-02 DIAGNOSIS — M25661 Stiffness of right knee, not elsewhere classified: Secondary | ICD-10-CM

## 2015-04-02 DIAGNOSIS — M25461 Effusion, right knee: Secondary | ICD-10-CM | POA: Diagnosis not present

## 2015-04-02 DIAGNOSIS — M25561 Pain in right knee: Secondary | ICD-10-CM | POA: Diagnosis not present

## 2015-04-02 NOTE — Therapy (Signed)
[facility] [place], [place], [address] Phone: [phone]   Fax:  [phone]  Physical Therapy Treatment  Patient Details  Name: Brittany Mercer MRN: [id number] Date of Birth: Jun 03, 1945 Referring Provider: Paralee Cancel, MD  Encounter Date: 04/02/2015      PT End of Session - 04/02/15 0910    Visit Number 12   Number of Visits 20   Date for PT Re-Evaluation 04/30/15   PT Start Time 0904   PT Stop Time 0949   PT Time Calculation (min) 45 min   Activity Tolerance Patient tolerated treatment well   Behavior During Therapy [facility] for tasks assessed/performed      Past Medical History  Diagnosis Date  . Hypertension   . Hyperlipidemia   . Peripheral neuropathy ([place])   . Headache   . Arthritis   . 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 Cesarean section      X2  . Tonsillectomy    . Knee surgery      right x2  . Shoulder surgery Left   . Abdominal hysterectomy      partial - 1 ovary removed/ 1 ovary remains  . Total knee arthroplasty  12/2014  . Total knee arthroplasty Right 01/25/2015    Procedure: RIGHT TOTAL KNEE ARTHROPLASTY;  Surgeon: Paralee Cancel, MD;  Location: [facility];  Service: Orthopedics;  Laterality: Right;    There were no vitals filed for this visit.  Visit Diagnosis:  Knee stiffness, right - Plan: PT plan of care cert/re-cert      Subjective Assessment - 04/02/15 0910    Subjective Patient states she went to MD yesterday and he wants PT to set up appointment for JAS splint.   How long can you sit comfortably? 20-25 minutes initial and up to 2 hours last week   How long can you walk comfortably? 10-15 minutes.   Patient Stated Goals Get out of my right knee pain.   Currently in Pain? Yes   Pain Score 2    Pain Location Knee   Pain Orientation Right   Pain Descriptors / Indicators Sore   Pain Type Surgical pain   Pain Onset  More than a month ago   Pain Frequency Constant   Aggravating Factors  everything            [facility] PT Assessment - 04/02/15 0001    Assessment   Medical Diagnosis Right total knee replacement.   Referring Provider Paralee Cancel, MD   Onset Date/Surgical Date 01/25/15   AROM   Overall AROM  Deficits   AROM Assessment Site Knee   Right/Left Knee Right   Right Knee Extension 17   Right Knee Flexion 108  pt has achieved 116 deg, but was measured after ext stretch   PROM   Overall PROM  Deficits   Right/Left Knee Right   Right Knee Extension -14   Right Knee Flexion 115   Strength   Overall Strength Comments --  Rt knee ext 5/5, flex 5-/5                     [facility] Adult PT Treatment/Exercise - 04/02/15 0001    Knee/Hip Exercises: Stretches   Other Knee/Hip Stretches supine ext stretch with Left ankle crossed over R knee x 30 sec   Knee/Hip Exercises: Aerobic   Recumbent Bike L0 x  5 min; L2 x 10 min full revolutions  adjusting seat for ROM   Manual Therapy   Manual Therapy Joint mobilization;Myofascial release;Passive ROM   Joint Mobilization in supine, ext mob with movement   Soft tissue mobilization STW/ IASTW to R HS and calf in prone to decrease tightness and with 2# cuff prolonged stretch; also in supine behind knee   Passive ROM for knee extension performed in prone and supine and with contract relax.                  PT Short Term Goals - 04/02/15 0945    PT SHORT TERM GOAL #1   Title Ind with HEP.   Time 4   Period Weeks   Status Achieved   PT SHORT TERM GOAL #2   Title Full active knee extension in order to normalize gait   Time 4   Period Weeks   Status On-going           PT Long Term Goals - 04/02/15 0946    PT LONG TERM GOAL #1   Title Active knee flexion to 115 degrees+ so the patient can perform functional tasks and do so with pain not > 2-3/10.   Period Weeks   Status Achieved   PT LONG TERM GOAL #2   Title Increase  right knee strength to a solid 5/5 to provide good stability for accomplishment of functional activities   Time 4   Period Weeks   Status On-going   PT LONG TERM GOAL #3   Title Decrease edema to within 2 cms of non-affected side to assist with pain reduction and range of motion gains.   Time 4   Period Weeks   Status Achieved   PT LONG TERM GOAL #4   Title Perform a reciprocating stair gait with one railing with pain not > 2-3/10.   Time 4   Period Weeks   Status On-going               Plan - 04/02/15 1102    Clinical Impression Statement Patient tolerated manual therapy well today. She was unhappy with not getting JAS splint yesterday and unfortunately PT was unable to contact JAS today to provide measurements, so plan is to do this 04/06/15. Patient continues to have deficits in flex and extension and HS weakness.  She still has difficulty descending stairs, but can ascend reciprocally.    Pt will benefit from skilled therapeutic intervention in order to improve on the following deficits Pain;Decreased activity tolerance;Increased edema;Decreased range of motion   Rehab Potential Good   PT Frequency 2x / week   PT Duration 4 weeks   PT Treatment/Interventions Electrical Stimulation;Cryotherapy;Therapeutic activities;Therapeutic exercise;Patient/family education;Manual techniques;Passive range of motion;Vasopneumatic Device;Stair training   PT Next Visit Plan Take JAS measurements and submit to rep asap. continue manual. Work on descending stairs. Recert sent [id number].   Consulted and Agree with Plan of Care Patient        Problem List Patient Active Problem List   Diagnosis Date Noted  . 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 Benign paroxysmal positional vertigo 02/09/2014  . 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 Essential hypertension, benign 07/17/2012  . Hyperlipidemia with target LDL less than 100 07/17/2012  . Migraines 07/17/2012  . Hereditary and  idiopathic peripheral neuropathy 07/17/2012  . Hypokalemia 07/17/2012   [name] PT  04/02/2015, 11:13 AM  [facility] [place], [place],  [place] Phone: [phone]   Fax:  [phone]  Name: Brittany Mercer MRN: [id number] Date of Birth: [birth year]

## 2015-04-06 ENCOUNTER — Encounter: Payer: Medicare Other | Admitting: Physical Therapy

## 2015-04-08 ENCOUNTER — Ambulatory Visit: Payer: Medicare Other | Admitting: Physical Therapy

## 2015-04-08 DIAGNOSIS — M25461 Effusion, right knee: Secondary | ICD-10-CM | POA: Diagnosis not present

## 2015-04-08 DIAGNOSIS — M25561 Pain in right knee: Secondary | ICD-10-CM

## 2015-04-08 DIAGNOSIS — M25661 Stiffness of right knee, not elsewhere classified: Secondary | ICD-10-CM | POA: Diagnosis not present

## 2015-04-08 NOTE — Therapy (Signed)
[facility] [place], [place], [address] Phone: [phone]   Fax:  [phone]  Physical Therapy Treatment  Patient Details  Name: Brittany Mercer MRN: [id number] Date of Birth: Jan 27, 1946 Referring Provider: Paralee Cancel, MD  Encounter Date: 04/08/2015      PT End of Session - 04/08/15 1522    Visit Number 13   Number of Visits 20   Date for PT Re-Evaluation 04/30/15   PT Start Time 2229   PT Stop Time 1606   PT Time Calculation (min) 49 min   Activity Tolerance Patient tolerated treatment well   Behavior During Therapy [facility] for tasks assessed/performed      Past Medical History  Diagnosis Date  . Hypertension   . Hyperlipidemia   . Peripheral neuropathy ([place])   . Headache   . Arthritis   . 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 Cesarean section      X2  . Tonsillectomy    . Knee surgery      right x2  . Shoulder surgery Left   . Abdominal hysterectomy      partial - 1 ovary removed/ 1 ovary remains  . Total knee arthroplasty  12/2014  . Total knee arthroplasty Right 01/25/2015    Procedure: RIGHT TOTAL KNEE ARTHROPLASTY;  Surgeon: Paralee Cancel, MD;  Location: [facility];  Service: Orthopedics;  Laterality: Right;    There were no vitals filed for this visit.  Visit Diagnosis:  Knee stiffness, right  Right knee pain      Subjective Assessment - 04/08/15 1522    Subjective Patient states she has had increased pain since 04/02/14. She did a lot the day before and woke up with it sore.    How long can you sit comfortably? 20-25 minutes initial and up to 2 hours last week   How long can you walk comfortably? patient able to run errands without limitation   Patient Stated Goals Get out of my right knee pain.   Currently in Pain? Yes   Pain Score 5    Pain Location Knee   Pain Orientation Right;Medial   Pain Descriptors / Indicators Sore   Pain Type Surgical pain   Pain Onset More than a month ago   Pain Frequency Constant   Aggravating Factors  sleeping when other knee touches medial R knee   Pain Relieving Factors ibuprofen   Effect of Pain on Daily Activities patient states she feels clumsier now            [facility] PT Assessment - 04/08/15 0001    Assessment   Medical Diagnosis Right total knee replacement.   AROM   Overall AROM  Deficits   AROM Assessment Site Knee   Right/Left Knee Right   Right Knee Extension -14   Right Knee Flexion 110   PROM   Overall PROM  Deficits   Right/Left Knee Right   Right Knee Extension -9   Right Knee Flexion 116                     [facility] Adult PT Treatment/Exercise - 04/08/15 0001    Knee/Hip Exercises: Aerobic   Recumbent Bike L0 x 10 min;   adjusting seat for ROM   Electrical Stimulation   Electrical Stimulation Location R knee   Electrical Stimulation Action premod   Electrical Stimulation Parameters 80-'150Hz'   to tolerance x 15 min   Electrical Stimulation Goals Pain   Manual Therapy   Manual Therapy Passive ROM;Soft tissue mobilization   Soft tissue mobilization in prone to R HS and gastroc with passive stretch into ext.   Passive ROM seated traction with passive flexion with active left knee extension 3 bouts x 5  increase in 5 deg active flexion after                  PT Short Term Goals - 04/08/15 1558    PT SHORT TERM GOAL #1   Title Ind with HEP.   Time 4   Status Achieved   PT SHORT TERM GOAL #2   Title Full active knee extension in order to normalize gait   Time 4   Period Weeks   Status On-going           PT Long Term Goals - 04/08/15 1558    PT LONG TERM GOAL #1   Title Active knee flexion to 115 degrees+ so the patient can perform functional tasks and do so with pain not > 2-3/10.   Time 4   Period Weeks   Status Partially Met   PT LONG TERM GOAL #2   Title Increase right knee strength to a solid 5/5 to provide  good stability for accomplishment of functional activities   Time 4   Period Weeks   Status On-going   PT LONG TERM GOAL #3   Title Decrease edema to within 2 cms of non-affected side to assist with pain reduction and range of motion gains.   Time 4   Period Weeks   Status Achieved   PT LONG TERM GOAL #4   Title Perform a reciprocating stair gait with one railing with pain not > 2-3/10.   Time 4   Period Weeks   Status On-going               Plan - 04/08/15 1605    Clinical Impression Statement Patient presents with increased pain today. No increased redness or edema noted. She has had decreased flexion the past two measurements where she had previously met her goal of active 115 degrees.Passive extension has increased. JAS rep came at end of treatment to measure patient for splint. Patient responded well to estim with 0/10 pain at end of session. Goals are ongoing.   Pt will benefit from skilled therapeutic intervention in order to improve on the following deficits Pain;Decreased activity tolerance;Increased edema;Decreased range of motion   Rehab Potential Good   PT Frequency 2x / week   PT Duration 4 weeks   PT Treatment/Interventions Electrical Stimulation;Cryotherapy;Therapeutic activities;Therapeutic exercise;Patient/family education;Manual techniques;Passive range of motion;Vasopneumatic Device;Stair training   PT Next Visit Plan Work on descending stairs, continue ROM for flexion and extension. Recert sent 09/03/[birth year].   Consulted and Agree with Plan of Care Patient        Problem List Patient Active Problem List   Diagnosis Date Noted  . 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 Benign paroxysmal positional vertigo 02/09/2014  . 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 Essential hypertension, benign 07/17/2012  . Hyperlipidemia with target LDL less than 100 07/17/2012  . Migraines 07/17/2012  . Hereditary and idiopathic peripheral neuropathy 07/17/2012  .  Hypokalemia 07/17/2012    Brittany Mercer PT  04/08/2015, 4:22 PM  [facility] [address] [place], [place], [address] Phone: [phone]   Fax:  [phone]  Name: Brittany  Mercer MRN: [id number] Date of Birth: 1946/02/19

## 2015-04-14 ENCOUNTER — Encounter: Payer: Medicare Other | Admitting: Physical Therapy

## 2015-04-16 ENCOUNTER — Encounter: Payer: Medicare Other | Admitting: Physical Therapy

## 2015-04-19 ENCOUNTER — Ambulatory Visit: Payer: Medicare Other | Admitting: Physical Therapy

## 2015-04-19 ENCOUNTER — Encounter: Payer: Self-pay | Admitting: Physical Therapy

## 2015-04-19 DIAGNOSIS — M25561 Pain in right knee: Secondary | ICD-10-CM

## 2015-04-19 DIAGNOSIS — M25661 Stiffness of right knee, not elsewhere classified: Secondary | ICD-10-CM

## 2015-04-19 DIAGNOSIS — M25461 Effusion, right knee: Secondary | ICD-10-CM | POA: Diagnosis not present

## 2015-04-19 NOTE — Therapy (Signed)
[facility] [place], [place], [address] Phone: [phone]   Fax:  [phone]  Physical Therapy Treatment  Patient Details  Name: Brittany Mercer MRN: [id number] Date of Birth: [birth year] Referring Provider: Paralee Cancel, MD  Encounter Date: 04/19/2015      PT End of Session - 04/19/15 0837    Visit Number 14   Number of Visits 20   Date for PT Re-Evaluation 04/30/15   PT Start Time 0817   PT Stop Time 0908   PT Time Calculation (min) 51 min   Activity Tolerance Patient tolerated treatment well   Behavior During Therapy [facility] for tasks assessed/performed      Past Medical History  Diagnosis Date  . Hypertension   . Hyperlipidemia   . Peripheral neuropathy ([place])   . Headache   . Arthritis   . 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 Cesarean section      X2  . Tonsillectomy    . Knee surgery      right x2  . Shoulder surgery Left   . Abdominal hysterectomy      partial - 1 ovary removed/ 1 ovary remains  . Total knee arthroplasty  12/2014  . Total knee arthroplasty Right 01/25/2015    Procedure: RIGHT TOTAL KNEE ARTHROPLASTY;  Surgeon: Paralee Cancel, MD;  Location: [facility];  Service: Orthopedics;  Laterality: Right;    There were no vitals filed for this visit.  Visit Diagnosis:  Knee stiffness, right  Right knee pain  Knee swelling, right      Subjective Assessment - 04/19/15 0819    Subjective Reports she was out of town last week and has not recieved JAS brace yet. States that her knee is hurting like it was after surgery but she walked a lot while out of town and hurts constantly at night.   Limitations Walking   How long can you sit comfortably? 20-25 minutes initial and up to 2 hours last week   How long can you walk comfortably? patient able to run errands without limitation   Patient Stated Goals Get out of my right knee pain.   Currently in Pain? Yes   Pain Score 5    Pain Location Knee   Pain Orientation Right   Pain Descriptors / Indicators Sore   Pain Type Surgical pain   Pain Onset More than a month ago            [facility] PT Assessment - 04/19/15 0001    Assessment   Medical Diagnosis Right total knee replacement.   Onset Date/Surgical Date 01/25/15   Next MD Visit 05/19/2015   ROM / Strength   AROM / PROM / Strength AROM   AROM   Overall AROM  Deficits   AROM Assessment Site Knee   Right/Left Knee Right   Right Knee Extension -17  at rest; -115 activetly   Right Knee Flexion 118                     [facility] Adult PT Treatment/Exercise - 04/19/15 0001    Knee/Hip Exercises: Aerobic   Nustep L6 x15 min   Knee/Hip Exercises: Standing   Rocker Board 4 minutes  Emphasis on calf stretch   Modalities   Modalities Retail buyer Location R knee   Electrical Stimulation Action  Pre-Mod   Electrical Stimulation Parameters 80-150 Hz x15 min   Electrical Stimulation Goals Pain   Manual Therapy   Manual Therapy Myofascial release   Myofascial Release MFR to R HS to decrease tightness in the region preventing full ROM                  PT Short Term Goals - 04/19/15 0854    PT SHORT TERM GOAL #1   Title Ind with HEP.   Time 4   Status Achieved   PT SHORT TERM GOAL #2   Title Full active knee extension in order to normalize gait   Time 4   Period Weeks   Status On-going  R knee ext -17 deg ext at rest, -15 deg ext actively           PT Long Term Goals - 04/19/15 0854    PT LONG TERM GOAL #1   Title Active knee flexion to 115 degrees+ so the patient can perform functional tasks and do so with pain not > 2-3/10.   Time 4   Period Weeks   Status Partially Met  AROM R knee flexion 118 deg but patient reported pain 04/19/2015   PT LONG TERM GOAL #2   Title Increase right knee strength to a solid 5/5 to provide  good stability for accomplishment of functional activities   Time 4   Period Weeks   Status On-going   PT LONG TERM GOAL #3   Title Decrease edema to within 2 cms of non-affected side to assist with pain reduction and range of motion gains.   Time 4   Period Weeks   Status Achieved   PT LONG TERM GOAL #4   Title Perform a reciprocating stair gait with one railing with pain not > 2-3/10.   Time 4   Period Weeks   Status On-going               Plan - 04/19/15 0856    Clinical Impression Statement Patient tolerated today's treatment fairly well although she reports increased pain but no increased redness or inflammation observed today. Increased R HS tightness noted during manual therapy throughout the posterior thigh. AROM of R knee measured as 15-118 deg with -17 deg ext at rest. Normal modalities response noted following removal of the modalities. Continues to report difficulty with pain ,extension, descending stairs and is to communcate with JAS representative about recieving brace today. Encouraged patient to complete prone hang at home to assist with extension of R knee. Patient experienced soreness in R knee following today's treatment.   Pt will benefit from skilled therapeutic intervention in order to improve on the following deficits Pain;Decreased activity tolerance;Increased edema;Decreased range of motion   Rehab Potential Good   PT Frequency 2x / week   PT Duration 4 weeks   PT Treatment/Interventions Electrical Stimulation;Cryotherapy;Therapeutic activities;Therapeutic exercise;Patient/family education;Manual techniques;Passive range of motion;Vasopneumatic Device;Stair training   PT Next Visit Plan Work on descending stairs, continue ROM for flexion and extension per MPT POC.   Consulted and Agree with Plan of Care Patient        Problem List Patient Active Problem List   Diagnosis Date Noted  . 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  Benign paroxysmal positional vertigo 02/09/2014  . 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 Essential hypertension, benign 07/17/2012  . Hyperlipidemia with target LDL less than 100 07/17/2012  . Migraines 07/17/2012  . Hereditary and idiopathic peripheral neuropathy  07/17/2012  . Hypokalemia 07/17/2012    Wynelle Fanny, PTA 04/19/2015, 9:12 AM  [facility] [address] [place], [place], [address] Phone: [phone]   Fax:  [phone]  Name: Brittany Mercer MRN: [id number] Date of Birth: 15-Oct-1945

## 2015-04-20 ENCOUNTER — Other Ambulatory Visit (INDEPENDENT_AMBULATORY_CARE_PROVIDER_SITE_OTHER): Payer: Medicare Other

## 2015-04-20 DIAGNOSIS — E785 Hyperlipidemia, unspecified: Secondary | ICD-10-CM

## 2015-04-20 DIAGNOSIS — I10 Essential (primary) hypertension: Secondary | ICD-10-CM

## 2015-04-20 DIAGNOSIS — E876 Hypokalemia: Secondary | ICD-10-CM | POA: Diagnosis not present

## 2015-04-20 NOTE — Progress Notes (Signed)
Lab only 

## 2015-04-21 ENCOUNTER — Encounter: Payer: Medicare Other | Admitting: Physical Therapy

## 2015-04-21 LAB — CMP14+EGFR
ALT: 14 IU/L (ref 0–32)
AST: 16 IU/L (ref 0–40)
Albumin/Globulin Ratio: 2.3 (ref 1.1–2.5)
Albumin: 4.8 g/dL (ref 3.6–4.8)
Alkaline Phosphatase: 64 IU/L (ref 39–117)
BUN/Creatinine Ratio: 21 (ref 11–26)
BUN: 25 mg/dL (ref 8–27)
Bilirubin Total: 0.8 mg/dL (ref 0.0–1.2)
CO2: 24 mmol/L (ref 18–29)
Calcium: 10 mg/dL (ref 8.7–10.3)
Chloride: 101 mmol/L (ref 96–106)
Creatinine, Ser: 1.21 mg/dL — ABNORMAL HIGH (ref 0.57–1.00)
GFR calc Af Amer: 53 mL/min/{1.73_m2} — ABNORMAL LOW (ref >59)
GFR calc non Af Amer: 46 mL/min/{1.73_m2} — ABNORMAL LOW (ref >59)
Globulin, Total: 2.1 g/dL (ref 1.5–4.5)
Glucose: 68 mg/dL (ref 65–99)
Potassium: 3.6 mmol/L (ref 3.5–5.2)
Sodium: 141 mmol/L (ref 134–144)
Total Protein: 6.9 g/dL (ref 6.0–8.5)

## 2015-04-21 LAB — LIPID PANEL
Chol/HDL Ratio: 4.9 ratio units — ABNORMAL HIGH (ref 0.0–4.4)
Cholesterol, Total: 226 mg/dL — ABNORMAL HIGH (ref 100–199)
HDL: 46 mg/dL (ref >39)
LDL Calculated: 152 mg/dL — ABNORMAL HIGH (ref 0–99)
Triglycerides: 142 mg/dL (ref 0–149)
VLDL Cholesterol Cal: 28 mg/dL (ref 5–40)

## 2015-04-21 LAB — CBC WITH DIFFERENTIAL/PLATELET
Basophils Absolute: 0 10*3/uL (ref 0.0–0.2)
Basos: 0 %
EOS (ABSOLUTE): 0.1 10*3/uL (ref 0.0–0.4)
Eos: 1 %
Hematocrit: 44.6 % (ref 34.0–46.6)
Hemoglobin: 15.2 g/dL (ref 11.1–15.9)
Immature Grans (Abs): 0 10*3/uL (ref 0.0–0.1)
Immature Granulocytes: 0 %
Lymphocytes Absolute: 1.9 10*3/uL (ref 0.7–3.1)
Lymphs: 17 %
MCH: 30.4 pg (ref 26.6–33.0)
MCHC: 34.1 g/dL (ref 31.5–35.7)
MCV: 89 fL (ref 79–97)
Monocytes Absolute: 0.9 10*3/uL (ref 0.1–0.9)
Monocytes: 9 %
Neutrophils Absolute: 8 10*3/uL — ABNORMAL HIGH (ref 1.4–7.0)
Neutrophils: 73 %
Platelets: 211 10*3/uL (ref 150–379)
RBC: 5 x10E6/uL (ref 3.77–5.28)
RDW: 14.8 % (ref 12.3–15.4)
WBC: 10.9 10*3/uL — ABNORMAL HIGH (ref 3.4–10.8)

## 2015-04-22 ENCOUNTER — Encounter: Payer: Self-pay | Admitting: Family Medicine

## 2015-04-22 ENCOUNTER — Ambulatory Visit (INDEPENDENT_AMBULATORY_CARE_PROVIDER_SITE_OTHER): Payer: Medicare Other | Admitting: Family Medicine

## 2015-04-22 VITALS — BP 99/61 | HR 74 | Temp 98.9°F | Ht 69.0 in | Wt 169.0 lb

## 2015-04-22 DIAGNOSIS — E785 Hyperlipidemia, unspecified: Secondary | ICD-10-CM

## 2015-04-22 DIAGNOSIS — I10 Essential (primary) hypertension: Secondary | ICD-10-CM

## 2015-04-22 MED ORDER — CYCLOBENZAPRINE HCL 5 MG PO TABS: 5.0000 mg | ORAL_TABLET | Freq: Three times a day (TID) | ORAL | Status: DC | PRN

## 2015-04-22 MED ORDER — ATENOLOL 50 MG PO TABS: 50.0000 mg | ORAL_TABLET | Freq: Every day | ORAL | Status: DC

## 2015-04-22 MED ORDER — TOPIRAMATE 25 MG PO TABS: 75.0000 mg | ORAL_TABLET | Freq: Every day | ORAL | Status: DC

## 2015-04-22 MED ORDER — FENOFIBRATE 160 MG PO TABS: 160.0000 mg | ORAL_TABLET | Freq: Every day | ORAL | Status: DC

## 2015-04-22 MED ORDER — POTASSIUM CHLORIDE ER 10 MEQ PO TBCR: EXTENDED_RELEASE_TABLET | ORAL | Status: DC

## 2015-04-22 MED ORDER — TRIAMTERENE-HCTZ 37.5-25 MG PO TABS: 0.5000 | ORAL_TABLET | Freq: Every day | ORAL | Status: DC

## 2015-04-22 NOTE — Progress Notes (Signed)
Subjective:  Patient ID: Brittany Mercer, female    DOB: December 10, 1945  Age: 70 y.o. MRN: [id number]  CC: Hyperlipidemia and Hypertension   HPI Brittany Mercer presents for  follow-up of hypertension. Patient has no history of headache chest pain or shortness of breath or recent cough. Patient also denies symptoms of TIA such as numbness weakness lateralizing. Patient checks  blood pressure at home and has not had any elevated readings recently. Patient denies side effects from his medication. States taking it regularly.  Patient also  in for follow-up of elevated cholesterol. Doing well without complaints on current medication. Denies side effects of statin including myalgia and arthralgia and nausea. Also in today for liver function testing. Currently no chest pain, shortness of breath or other cardiovascular related symptoms noted.  PT. For leg. Using a splint.     History Brittany Mercer has a past medical history of Hypertension; Hyperlipidemia; Peripheral neuropathy ([place]); Headache; Arthritis; and Sleepwalking.   She has past surgical history that includes Cesarean section; Tonsillectomy; Knee surgery; Shoulder surgery (Left); Abdominal hysterectomy; Total knee arthroplasty (12/2014); and Total knee arthroplasty (Right, 01/25/2015).   Her family history includes Dementia in her father; Hyperlipidemia in her mother; Hypertension in her brother, father, and mother; Neuropathy in her father; Stroke in her mother.She reports that she quit smoking about 16 years ago. Her smoking use included Cigarettes. She has a 12.5 pack-year smoking history. She does not have any smokeless tobacco history on file. She reports that she drinks alcohol. She reports that she does not use illicit drugs.  Current Outpatient Prescriptions on File Prior to Visit  Medication Sig Dispense Refill  . BIOTIN PO Take 2 tablets by mouth daily. Gummies.    Marland Kitchen ezetimibe (ZETIA) 10 MG tablet Take 1 tablet (10 mg total) by mouth  daily. For cholesterol (Patient taking differently: Take 10 mg by mouth every evening. For cholesterol) 90 tablet 3  . ferrous sulfate 325 (65 FE) MG tablet Take 1 tablet (325 mg total) by mouth 3 (three) times daily after meals.  3  . loratadine (CLARITIN) 10 MG tablet Take 10 mg by mouth daily as needed for allergies.    . NUCYNTA 100 MG TABS Take 1 tablet by mouth. Four to six times daily as needed for pain.    . Multiple Vitamin (MULTIVITAMIN WITH MINERALS) TABS tablet Take 2 tablets by mouth daily. Reported on 04/22/2015     No current facility-administered medications on file prior to visit.    ROS Review of Systems  Constitutional: Negative for fever, activity change and appetite change.  HENT: Negative for congestion, rhinorrhea and sore throat.   Eyes: Negative for visual disturbance.  Respiratory: Negative for cough and shortness of breath.   Cardiovascular: Negative for chest pain and palpitations.  Gastrointestinal: Negative for nausea, abdominal pain and diarrhea.  Genitourinary: Negative for dysuria.  Musculoskeletal: Negative for myalgias and arthralgias.    Objective:  BP 99/61 mmHg  Pulse 74  Temp(Src) 98.9 F (37.2 C) (Oral)  Ht 5\' 9"  (1.753 m)  Wt 169 lb (76.658 kg)  BMI 24.95 kg/m2  BP Readings from Last 3 Encounters:  04/22/15 99/61  01/26/15 121/58  01/18/15 117/62    Wt Readings from Last 3 Encounters:  04/22/15 169 lb (76.658 kg)  01/25/15 174 lb (78.926 kg)  01/18/15 174 lb (78.926 kg)     Physical Exam  Constitutional: She is oriented to person, place, and time. She appears well-developed and well-nourished. No distress.  HENT:  Head: Normocephalic and atraumatic.  Right Ear: External ear normal.  Left Ear: External ear normal.  Nose: Nose normal.  Mouth/Throat: Oropharynx is clear and moist.  Eyes: Conjunctivae and EOM are normal. Pupils are equal, round, and reactive to light.  Neck: Normal range of motion. Neck supple. No thyromegaly  present.  Cardiovascular: Normal rate, regular rhythm and normal heart sounds.   No murmur heard. Pulmonary/Chest: Effort normal and breath sounds normal. No respiratory distress. She has no wheezes. She has no rales.  Abdominal: Soft. Bowel sounds are normal. She exhibits no distension. There is no tenderness.  Lymphadenopathy:    She has no cervical adenopathy.  Neurological: She is alert and oriented to person, place, and time. She has normal reflexes.  Skin: Skin is warm and dry.  Psychiatric: She has a normal mood and affect. Her behavior is normal. Judgment and thought content normal.    No results found for: HGBA1C  Lab Results  Component Value Date   WBC 10.9* 04/20/2015   HGB 12.1 01/26/2015   HCT 44.6 04/20/2015   PLT 211 04/20/2015   GLUCOSE 68 04/20/2015   CHOL 226* 04/20/2015   TRIG 142 04/20/2015   HDL 46 04/20/2015   LDLCALC 152* 04/20/2015   ALT 14 04/20/2015   AST 16 04/20/2015   NA 141 04/20/2015   K 3.6 04/20/2015   CL 101 04/20/2015   CREATININE 1.21* 04/20/2015   BUN 25 04/20/2015   CO2 24 04/20/2015   TSH 1.803 09/25/2014   INR 1.02 01/18/2015    No results found.  Assessment & Plan:   Ashmi was seen today for hyperlipidemia and hypertension.  Diagnoses and all orders for this visit:  Essential hypertension, benign  Hyperlipidemia with target LDL less than 100  Other orders -     fenofibrate 160 MG tablet; Take 1 tablet (160 mg total) by mouth daily. For cholesterol and triglyceride -     atenolol (TENORMIN) 50 MG tablet; Take 1 tablet (50 mg total) by mouth daily. -     cyclobenzaprine (FLEXERIL) 5 MG tablet; Take 1 tablet (5 mg total) by mouth 3 (three) times daily as needed for muscle spasms. -     potassium chloride (K-DUR) 10 MEQ tablet; TAKE 2 TO 3 TABLETS DAILY AS DIRECTED -     topiramate (TOPAMAX) 25 MG tablet; Take 3 tablets (75 mg total) by mouth at bedtime. -     triamterene-hydrochlorothiazide (MAXZIDE-25) 37.5-25 MG tablet;  Take 0.5 tablets by mouth daily.  I have discontinued Ms. Appleton's meclizine, docusate sodium, and polyethylene glycol. I have also changed her atenolol, topiramate, and triamterene-hydrochlorothiazide. Additionally, I am having her start on fenofibrate. Lastly, I am having her maintain her NUCYNTA, loratadine, ezetimibe, multivitamin with minerals, BIOTIN PO, ferrous sulfate, cyclobenzaprine, and potassium chloride.  Meds ordered this encounter  Medications  . fenofibrate 160 MG tablet    Sig: Take 1 tablet (160 mg total) by mouth daily. For cholesterol and triglyceride    Dispense:  90 tablet    Refill:  3  . atenolol (TENORMIN) 50 MG tablet    Sig: Take 1 tablet (50 mg total) by mouth daily.    Dispense:  90 tablet    Refill:  1  . cyclobenzaprine (FLEXERIL) 5 MG tablet    Sig: Take 1 tablet (5 mg total) by mouth 3 (three) times daily as needed for muscle spasms.    Dispense:  30 tablet    Refill:  5  . potassium  chloride (K-DUR) 10 MEQ tablet    Sig: TAKE 2 TO 3 TABLETS DAILY AS DIRECTED    Dispense:  180 tablet    Refill:  1  . topiramate (TOPAMAX) 25 MG tablet    Sig: Take 3 tablets (75 mg total) by mouth at bedtime.    Dispense:  270 tablet    Refill:  3  . triamterene-hydrochlorothiazide (MAXZIDE-25) 37.5-25 MG tablet    Sig: Take 0.5 tablets by mouth daily.    Dispense:  90 tablet    Refill:  0     Follow-up: Return in about 6 months (around 10/20/2015) for CPE.  Claretta Fraise, M.D.

## 2015-04-23 DIAGNOSIS — M47816 Spondylosis without myelopathy or radiculopathy, lumbar region: Secondary | ICD-10-CM | POA: Diagnosis not present

## 2015-04-23 DIAGNOSIS — G609 Hereditary and idiopathic neuropathy, unspecified: Secondary | ICD-10-CM | POA: Diagnosis not present

## 2015-04-23 DIAGNOSIS — M47812 Spondylosis without myelopathy or radiculopathy, cervical region: Secondary | ICD-10-CM | POA: Diagnosis not present

## 2015-04-23 DIAGNOSIS — M1990 Unspecified osteoarthritis, unspecified site: Secondary | ICD-10-CM | POA: Diagnosis not present

## 2015-05-19 DIAGNOSIS — Z96651 Presence of right artificial knee joint: Secondary | ICD-10-CM | POA: Diagnosis not present

## 2015-05-19 DIAGNOSIS — Z471 Aftercare following joint replacement surgery: Secondary | ICD-10-CM | POA: Diagnosis not present

## 2015-06-07 ENCOUNTER — Ambulatory Visit: Payer: Medicare Other | Attending: Orthopedic Surgery | Admitting: Physical Therapy

## 2015-06-07 DIAGNOSIS — M25561 Pain in right knee: Secondary | ICD-10-CM | POA: Diagnosis not present

## 2015-06-07 DIAGNOSIS — M25461 Effusion, right knee: Secondary | ICD-10-CM

## 2015-06-07 DIAGNOSIS — M25661 Stiffness of right knee, not elsewhere classified: Secondary | ICD-10-CM | POA: Insufficient documentation

## 2015-06-07 NOTE — Therapy (Signed)
[facility] [address] [place], [place], [address] Phone: [phone]   Fax:  [phone]  Physical Therapy Evaluation  Patient Details  Name: Brittany Mercer MRN: [id number] Date of Birth: [birth year] Referring Provider: Durene Romans, MD  Encounter Date: 06/07/2015      PT End of Session - 06/07/15 1418    Visit Number 1   Number of Visits 12   Date for PT Re-Evaluation 07/19/15   PT Start Time 0154   PT Stop Time 0235   PT Time Calculation (min) 41 min   Activity Tolerance Patient tolerated treatment well   Behavior During Therapy [facility] for tasks assessed/performed      Past Medical History  Diagnosis Date  . Hypertension   . Hyperlipidemia   . Peripheral neuropathy ([facility])   . Headache   . Arthritis   . 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 Cesarean section      X2  . Tonsillectomy    . Knee surgery      right x2  . Shoulder surgery Left   . Abdominal hysterectomy      partial - 1 ovary removed/ 1 ovary remains  . Total knee arthroplasty  12/2014  . Total knee arthroplasty Right 01/25/2015    Procedure: RIGHT TOTAL KNEE ARTHROPLASTY;  Surgeon: Durene Romans, MD;  Location: [facility];  Service: Orthopedics;  Laterality: Right;    There were no vitals filed for this visit.  Visit Diagnosis:  Knee stiffness, right - Plan: PT plan of care cert/re-cert  Right knee pain - Plan: PT plan of care cert/re-cert  Knee swelling, right - Plan: PT plan of care cert/re-cert      Subjective Assessment - 06/07/15 1422    Subjective I'm frustrated with my knee.  It hurt to bad yesterday after using the JAS I could hardly get out of bed.   Limitations Walking   How long can you walk comfortably? patient able to run errands without limitation   Patient Stated Goals Get out of my right knee pain.   Pain Score 3    Pain Location Knee   Pain Orientation Right   Pain  Descriptors / Indicators Sore   Pain Type Surgical pain   Pain Onset More than a month ago   Pain Frequency Constant            [facility] PT Assessment - 06/07/15 0001    Assessment   Medical Diagnosis Right total knee replacement   Onset Date/Surgical Date 01/25/15   Precautions   Precautions --  No ultrasound.   Restrictions   Weight Bearing Restrictions No   Balance Screen   Has the patient fallen in the past 6 months No   Has the patient had a decrease in activity level because of a fear of falling?  No   Is the patient reluctant to leave their home because of a fear of falling?  No   Home [employer] information [employer] residence   Prior Function   Level of Independence Independent   ROM / Strength   AROM / PROM / Strength AROM;Strength   AROM   Right/Left Knee Right   Right Knee Extension -19   Right Knee Flexion 105   PROM   Right/Left Knee Right   Right Knee Extension -15   Right Knee Flexion 110   Strength  Overall Strength Comments Right hip and knee strength= 4+/5 to 5/5.   Palpation   Palpation comment Pain "in" knee.   Ambulation/Gait   Gait Comments The patient walks with right knee in flexion.                   [facility] Adult PT Treatment/Exercise - 06-Jul-2015 0001    Knee/Hip Exercises: Aerobic   Nustep Level 6 and 7 x 15 minutes with increased cadence with seat back to focus on extension.                  PT Short Term Goals - 2015-07-06 1427    PT SHORT TERM GOAL #1   Title Ind with HEP.   Time 4   Period Weeks   Status New   PT SHORT TERM GOAL #2   Title Full active knee extension in order to normalize gait   Time 4   Period Weeks   Status New           PT Long Term Goals - July 06, 2015 1427    PT LONG TERM GOAL #1   Title Active knee flexion to 115 degrees+ so the patient can perform functional tasks and do so with pain not > 2-3/10.   Time 6   Period Weeks   Status New               Plan -  07-06-2015 1424    Clinical Impression Statement The patient returns to physical therapy with continued loss of range of motion of her right knee (s/p TKA).  She has been using a JAS daily at home and reports intense pain sometimes after use.  Her resting pain-level is a 3/10.  She really wants to get her knee straight and decrease her pain for functional activites.   Pt will benefit from skilled therapeutic intervention in order to improve on the following deficits Abnormal gait;Decreased range of motion;Pain   Rehab Potential Good   PT Frequency 3x / week   PT Duration 4 weeks   PT Treatment/Interventions Therapeutic activities;Therapeutic exercise;Manual techniques;Electrical Stimulation;Moist Heat;Vasopneumatic Device;Cryotherapy   PT Next Visit Plan Please focus on ther ex and manual techniques (ie:  PROM) to increase patient's right knee range of motion.   Consulted and Agree with Plan of Care Patient          G-Codes - 06-Jul-2015 1418    Functional Assessment Tool Used FOTO...53% limitation.   Functional Limitation Mobility: Walking and moving around   Mobility: Walking and Moving Around Current Status [phone]) At least 40 percent but less than 60 percent impaired, limited or restricted   Mobility: Walking and Moving Around Goal Status [phone]) At least 20 percent but less than 40 percent impaired, limited or restricted       Problem List Patient Active Problem List   Diagnosis Date Noted  . 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 Benign paroxysmal positional vertigo 02/09/2014  . 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 Essential hypertension, benign 07/17/2012  . Hyperlipidemia with target LDL less than 100 07/17/2012  . Migraines 07/17/2012  . Hereditary and idiopathic peripheral neuropathy 07/17/2012  . Hypokalemia 07/17/2012    Giovannie Scerbo, [place] MPT 07/06/15, 4:30 PM  [facility] [address] [place], [place],  [address] Phone: [phone]   Fax:  [phone]  Name: Brittany Mercer MRN: [id number] Date of Birth: 10/06/1945

## 2015-06-09 ENCOUNTER — Ambulatory Visit: Payer: Medicare Other | Admitting: Physical Therapy

## 2015-06-09 ENCOUNTER — Encounter: Payer: Self-pay | Admitting: Physical Therapy

## 2015-06-09 DIAGNOSIS — M25461 Effusion, right knee: Secondary | ICD-10-CM | POA: Diagnosis not present

## 2015-06-09 DIAGNOSIS — M25661 Stiffness of right knee, not elsewhere classified: Secondary | ICD-10-CM | POA: Diagnosis not present

## 2015-06-09 DIAGNOSIS — M25561 Pain in right knee: Secondary | ICD-10-CM | POA: Diagnosis not present

## 2015-06-09 NOTE — Patient Instructions (Signed)
Knee Extension Mobilization: Towel Prop   With rolled towel under right ankle, place _1-5___ pound weight across knee. Hold __5+__ minutes. Repeat __2-3__ times per set. Do __2__ sets per session. Do __2-4__ sessions per day.  Knee ext stretch    With hands above right kneecap, gently push knee down until you feel a stretch. Hold __10__ seconds. Repeat _10___ times per set. Do __5+__ sets per session. Do __2-4__ sessions per day.   Stretching: Hamstring (Standing)    Place right foot on small stool. Slowly lean forward, keeping back straight, until stretch is felt in back of thigh. Hold __30__ seconds. Repeat __3__ times per set. Do __1-2__ sets per session. Do _2-4___ sessions per day.      Knee Flexion Stretch on Step  Place foot on step and lean forward until you feel a good stretch in front of knee.   hold 30 sec x 5-10 perform 2-4 x daily

## 2015-06-09 NOTE — Therapy (Signed)
[facility] [place], [place], [address] Phone: [phone]   Fax:  [phone]  Physical Therapy Treatment  Patient Details  Name: Brittany Mercer MRN: [id number] Date of Birth: 1946/01/18 Referring Provider: Paralee Cancel, MD  Encounter Date: 06/09/2015      PT End of Session - 06/09/15 1023    Visit Number 2   Number of Visits 12   Date for PT Re-Evaluation 07/19/15   PT Start Time 0942   PT Stop Time 1023   PT Time Calculation (min) 41 min   Activity Tolerance Patient tolerated treatment well   Behavior During Therapy [facility] for tasks assessed/performed      Past Medical History  Diagnosis Date  . Hypertension   . Hyperlipidemia   . Peripheral neuropathy ([place])   . Headache   . Arthritis   . 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 Cesarean section      X2  . Tonsillectomy    . Knee surgery      right x2  . Shoulder surgery Left   . Abdominal hysterectomy      partial - 1 ovary removed/ 1 ovary remains  . Total knee arthroplasty  12/2014  . Total knee arthroplasty Right 01/25/2015    Procedure: RIGHT TOTAL KNEE ARTHROPLASTY;  Surgeon: Paralee Cancel, MD;  Location: [facility];  Service: Orthopedics;  Laterality: Right;    There were no vitals filed for this visit.  Visit Diagnosis:  Knee stiffness, right  Right knee pain  Knee swelling, right      Subjective Assessment - 06/09/15 0946    Subjective Patient reported falling last week due to balance and one leg longer than other, and pain up to 6/10 after using JAS brace   Limitations Walking   How long can you sit comfortably? 20-25 minutes initial and up to 2 hours last week   How long can you walk comfortably? patient able to run errands without limitation   Patient Stated Goals Get out of my right knee pain.   Currently in Pain? Yes   Pain Score 3    Pain Location Knee   Pain  Orientation Right   Pain Descriptors / Indicators Sore   Pain Type Surgical pain   Pain Onset More than a month ago   Pain Frequency Constant   Aggravating Factors  JAS brace    Pain Relieving Factors rest            [facility] PT Assessment - 06/09/15 0001    AROM   Right/Left Knee Right   Right Knee Extension -15   Right Knee Flexion 105   PROM   Right/Left Knee Right   Right Knee Extension -12   Right Knee Flexion 110                     [facility] Adult PT Treatment/Exercise - 06/09/15 0001    Knee/Hip Exercises: Aerobic   Nustep Level 6 and 7 x 15 minutes with increased cadence with seat back to focus on extension. per MPT   Manual Therapy   Manual Therapy Passive ROM   Passive ROM Manual PROM for right knee flexion with low load holds, then ext with overpressure                PT Education - 06/09/15 1001    Education provided Yes  [facility] [place], [place], [address] Phone: [phone]   Fax:  [phone]  Physical Therapy Treatment  Patient Details  Name: Brittany Mercer MRN: [id number] Date of Birth: 1946/01/18 Referring Provider: Paralee Cancel, MD  Encounter Date: 06/09/2015      PT End of Session - 06/09/15 1023    Visit Number 2   Number of Visits 12   Date for PT Re-Evaluation 07/19/15   PT Start Time 0942   PT Stop Time 1023   PT Time Calculation (min) 41 min   Activity Tolerance Patient tolerated treatment well   Behavior During Therapy [facility] for tasks assessed/performed      Past Medical History  Diagnosis Date  . Hypertension   . Hyperlipidemia   . Peripheral neuropathy ([place])   . Headache   . Arthritis   . 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 Cesarean section      X2  . Tonsillectomy    . Knee surgery      right x2  . Shoulder surgery Left   . Abdominal hysterectomy      partial - 1 ovary removed/ 1 ovary remains  . Total knee arthroplasty  12/2014  . Total knee arthroplasty Right 01/25/2015    Procedure: RIGHT TOTAL KNEE ARTHROPLASTY;  Surgeon: Paralee Cancel, MD;  Location: [facility];  Service: Orthopedics;  Laterality: Right;    There were no vitals filed for this visit.  Visit Diagnosis:  Knee stiffness, right  Right knee pain  Knee swelling, right      Subjective Assessment - 06/09/15 0946    Subjective Patient reported falling last week due to balance and one leg longer than other, and pain up to 6/10 after using JAS brace   Limitations Walking   How long can you sit comfortably? 20-25 minutes initial and up to 2 hours last week   How long can you walk comfortably? patient able to run errands without limitation   Patient Stated Goals Get out of my right knee pain.   Currently in Pain? Yes   Pain Score 3    Pain Location Knee   Pain  Orientation Right   Pain Descriptors / Indicators Sore   Pain Type Surgical pain   Pain Onset More than a month ago   Pain Frequency Constant   Aggravating Factors  JAS brace    Pain Relieving Factors rest            [facility] PT Assessment - 06/09/15 0001    AROM   Right/Left Knee Right   Right Knee Extension -15   Right Knee Flexion 105   PROM   Right/Left Knee Right   Right Knee Extension -12   Right Knee Flexion 110                     [facility] Adult PT Treatment/Exercise - 06/09/15 0001    Knee/Hip Exercises: Aerobic   Nustep Level 6 and 7 x 15 minutes with increased cadence with seat back to focus on extension. per MPT   Manual Therapy   Manual Therapy Passive ROM   Passive ROM Manual PROM for right knee flexion with low load holds, then ext with overpressure                PT Education - 06/09/15 1001    Education provided Yes

## 2015-06-15 ENCOUNTER — Ambulatory Visit: Payer: Medicare Other | Admitting: Physical Therapy

## 2015-06-15 ENCOUNTER — Encounter: Payer: Self-pay | Admitting: Physical Therapy

## 2015-06-15 DIAGNOSIS — M25461 Effusion, right knee: Secondary | ICD-10-CM | POA: Diagnosis not present

## 2015-06-15 DIAGNOSIS — M25561 Pain in right knee: Secondary | ICD-10-CM

## 2015-06-15 DIAGNOSIS — M25661 Stiffness of right knee, not elsewhere classified: Secondary | ICD-10-CM | POA: Diagnosis not present

## 2015-06-15 NOTE — Patient Instructions (Signed)
Strengthening: Hip Abduction (Side-Lying)    Tighten muscles on front of left thigh, then lift leg _6___ inches from surface, keeping knee locked.  Repeat _10___ times per set. Do __2-3__ sets per session. Do __2-3__ sessions per day.  [email or web address].exer.[email or web address]   Copyright  VHI. All rights reserved.

## 2015-06-15 NOTE — Therapy (Signed)
[facility] [place], [place], [address] Phone: [phone]   Fax:  [phone]  Physical Therapy Treatment  Patient Details  Name: Brittany Mercer MRN: [id number] Date of Birth: 01-Jun-1945 Referring Provider: Paralee Cancel, MD  Encounter Date: 06/15/2015      PT End of Session - 06/15/15 0950    Visit Number 3   Number of Visits 12   Date for PT Re-Evaluation 07/19/15   PT Start Time 0948   PT Stop Time 1028   PT Time Calculation (min) 40 min   Activity Tolerance Patient tolerated treatment well   Behavior During Therapy [facility] for tasks assessed/performed      Past Medical History  Diagnosis Date  . Hypertension   . Hyperlipidemia   . Peripheral neuropathy ([place])   . Headache   . Arthritis   . 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 Cesarean section      X2  . Tonsillectomy    . Knee surgery      right x2  . Shoulder surgery Left   . Abdominal hysterectomy      partial - 1 ovary removed/ 1 ovary remains  . Total knee arthroplasty  12/2014  . Total knee arthroplasty Right 01/25/2015    Procedure: RIGHT TOTAL KNEE ARTHROPLASTY;  Surgeon: Paralee Cancel, MD;  Location: [facility];  Service: Orthopedics;  Laterality: Right;    There were no vitals filed for this visit.  Visit Diagnosis:  Knee stiffness, right  Right knee pain  Knee swelling, right      Subjective Assessment - 06/15/15 0949    Subjective States that she was getting in bed Friday night and heard a crunch and is still sore from that. States that she has noticed her R knee moves inward in standing.   Limitations Walking   How long can you sit comfortably? 20-25 minutes initial and up to 2 hours last week   How long can you walk comfortably? patient able to run errands without limitation   Patient Stated Goals Get out of my right knee pain.   Currently in Pain? No/denies             [facility] PT Assessment - 06/15/15 0001    Assessment   Medical Diagnosis Right total knee replacement   Onset Date/Surgical Date 01/25/15   Next MD Visit 06/30/2015   Restrictions   Weight Bearing Restrictions No                     [facility] Adult PT Treatment/Exercise - 06/15/15 0001    Knee/Hip Exercises: Aerobic   Nustep L8, seat 12 x15 min to improve R knee extension   Knee/Hip Exercises: Sidelying   Hip ABduction Strengthening;Right;1 set;10 reps  for HEP understanding   Manual Therapy   Manual Therapy Passive ROM;Myofascial release   Myofascial Release MFR/STW to R HS/ Gastrocnemius/ ITB/ adductors to decrease tightness   Passive ROM PROM of R knee extension with holds at end range and oscillations to promote relaxation                  PT Short Term Goals - 06/09/15 1026    PT SHORT TERM GOAL #1   Title Ind with HEP.   Time 4   Period Weeks   Status Achieved   PT SHORT TERM GOAL #2   Title  Full active knee extension in order to normalize gait   Time 4   Period Weeks   Status On-going  AROM -15 degrees           PT Long Term Goals - 06/09/15 1027    PT LONG TERM GOAL #1   Title Active knee flexion to 115 degrees+ so the patient can perform functional tasks and do so with pain not > 2-3/10.   Time 6   Period Weeks   Status On-going  AROM 105 degrees               Plan - 06/15/15 1309    Clinical Impression Statement Patient tolerated today's treatment well without reports of pain or discomfort. Patient verbalized dislike for JAS brace and disappointment with overall results with JAS brace. Patient presented with increased tightness especially in R HS and gastrocnemius but was also tight in R adductors and ITB. Patient's R knee extension remains very limited with very firm end feel noted in extension. Patient tolerated sidelying R hip abduction well with no reports of pain and understanding of exercise's purpose. Patient  accepted hip abduction HEP without questions today and verabalized understanding. Patient denied pain following today's treatment.   Pt will benefit from skilled therapeutic intervention in order to improve on the following deficits Abnormal gait;Decreased range of motion;Pain   Rehab Potential Good   PT Frequency 3x / week   PT Duration 4 weeks   PT Treatment/Interventions Therapeutic activities;Therapeutic exercise;Manual techniques;Electrical Stimulation;Moist Heat;Vasopneumatic Device;Cryotherapy   PT Next Visit Plan Please focus on ther ex and manual techniques (ie:  PROM) to increase patient's right knee range of motion.   Consulted and Agree with Plan of Care Patient        Problem List Patient Active Problem List   Diagnosis Date Noted  . 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 Benign paroxysmal positional vertigo 02/09/2014  . 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 Essential hypertension, benign 07/17/2012  . Hyperlipidemia with target LDL less than 100 07/17/2012  . Migraines 07/17/2012  . Hereditary and idiopathic peripheral neuropathy 07/17/2012  . Hypokalemia 07/17/2012    Wynelle Fanny, PTA 06/15/2015, 3:31 PM  [facility] [address] [place], [place], [address] Phone: [phone]   Fax:  [phone]  Name: Brittany Mercer MRN: [id number] Date of Birth: Jan 29, 1946

## 2015-06-17 ENCOUNTER — Encounter: Payer: Self-pay | Admitting: Physical Therapy

## 2015-06-17 ENCOUNTER — Ambulatory Visit: Payer: Medicare Other | Admitting: Physical Therapy

## 2015-06-17 DIAGNOSIS — M25461 Effusion, right knee: Secondary | ICD-10-CM

## 2015-06-17 DIAGNOSIS — M25661 Stiffness of right knee, not elsewhere classified: Secondary | ICD-10-CM | POA: Diagnosis not present

## 2015-06-17 DIAGNOSIS — M25561 Pain in right knee: Secondary | ICD-10-CM | POA: Diagnosis not present

## 2015-06-17 NOTE — Therapy (Signed)
[facility] [place], [place], [address] Phone: [phone]   Fax:  [phone]  Physical Therapy Treatment  Patient Details  Name: Brittany Mercer MRN: [id number] Date of Birth: 12/24/1945 Referring Provider: Paralee Cancel, MD  Encounter Date: 06/17/2015      PT End of Session - 06/17/15 1120    Visit Number 4   Number of Visits 12   Date for PT Re-Evaluation 07/19/15   PT Start Time [id number]   PT Stop Time 1119   PT Time Calculation (min) 50 min   Activity Tolerance Patient tolerated treatment well   Behavior During Therapy [facility] for tasks assessed/performed      Past Medical History  Diagnosis Date  . Hypertension   . Hyperlipidemia   . Peripheral neuropathy ([place])   . Headache   . Arthritis   . 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 Cesarean section      X2  . Tonsillectomy    . Knee surgery      right x2  . Shoulder surgery Left   . Abdominal hysterectomy      partial - 1 ovary removed/ 1 ovary remains  . Total knee arthroplasty  12/2014  . Total knee arthroplasty Right 01/25/2015    Procedure: RIGHT TOTAL KNEE ARTHROPLASTY;  Surgeon: Paralee Cancel, MD;  Location: [facility];  Service: Orthopedics;  Laterality: Right;    There were no vitals filed for this visit.  Visit Diagnosis:  Knee stiffness, right  Right knee pain  Knee swelling, right      Subjective Assessment - 06/17/15 1034    Subjective Patient reported some soreness and stiff knee today   Limitations Walking   How long can you sit comfortably? 20-25 minutes initial and up to 2 hours last week   How long can you walk comfortably? patient able to run errands without limitation   Patient Stated Goals Get out of my right knee pain.   Currently in Pain? Yes   Pain Score 2    Pain Location Knee   Pain Orientation Right   Pain Descriptors / Indicators Sore   Pain Type Surgical  pain   Pain Onset More than a month ago   Pain Frequency Constant   Aggravating Factors  ROM   Pain Relieving Factors rest            [facility] PT Assessment - 06/17/15 0001    AROM   AROM Assessment Site Knee   Right/Left Knee Right   Right Knee Extension -15   Right Knee Flexion 105   PROM   Right/Left Knee Right   Right Knee Extension -11   Right Knee Flexion 110                     [facility] Adult PT Treatment/Exercise - 06/17/15 0001    Knee/Hip Exercises: Aerobic   Nustep L8, seat 12 x15 min to improve R knee extension   Knee/Hip Exercises: Standing   Rocker Board 4 minutes   Manual Therapy   Manual Therapy Passive ROM;Myofascial release   Passive ROM PROM for right knee ext with overpressure using heat post thigh to relax muscle, then gentle flexion stretch                  PT Short Term Goals - 06/17/15 1129    PT SHORT TERM  GOAL #1   Title Ind with HEP.   Time 4   Period Weeks   Status Achieved   PT SHORT TERM GOAL #2   Title Full active knee extension in order to normalize gait   Time 4   Period Weeks   Status On-going  AROM -15 degrees 06/17/15           PT Long Term Goals - 06/17/15 1130    PT LONG TERM GOAL #1   Title Active knee flexion to 115 degrees+ so the patient can perform functional tasks and do so with pain not > 2-3/10.   Time 6   Period Weeks   Status On-going  AROM 110 degrees 06/17/15               Plan - 06/17/15 1125    Clinical Impression Statement Patient progressing slowly with ROM due to stiff knee. Patient has little pain overall. Patient reports using Jas Brace less yet does stretching daily to improve ROM. patient improved ext by one degree today. Unable to meet any further goals due to ROM limitations.   Pt will benefit from skilled therapeutic intervention in order to improve on the following deficits Abnormal gait;Decreased range of motion;Pain   Rehab Potential Good   PT Frequency 3x / week    PT Duration 4 weeks   PT Treatment/Interventions Therapeutic activities;Therapeutic exercise;Manual techniques;Electrical Stimulation;Moist Heat;Vasopneumatic Device;Cryotherapy   PT Next Visit Plan Please focus on ther ex and manual techniques (ie:  PROM) to increase patient's right knee range of motion.   Consulted and Agree with Plan of Care Patient        Problem List Patient Active Problem List   Diagnosis Date Noted  . 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 Benign paroxysmal positional vertigo 02/09/2014  . 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 Essential hypertension, benign 07/17/2012  . Hyperlipidemia with target LDL less than 100 07/17/2012  . Migraines 07/17/2012  . Hereditary and idiopathic peripheral neuropathy 07/17/2012  . Hypokalemia 07/17/2012    Phillips Climes, PTA 06/17/2015, 11:31 AM  [facility] [place], [place], [address] Phone: [phone]   Fax:  [phone]  Name: Brittany Mercer MRN: [id number] Date of Birth: 10-06-1945

## 2015-06-18 ENCOUNTER — Encounter: Payer: Medicare Other | Admitting: Physical Therapy

## 2015-06-22 ENCOUNTER — Encounter: Payer: Medicare Other | Admitting: Physical Therapy

## 2015-06-24 ENCOUNTER — Ambulatory Visit: Payer: Medicare Other | Admitting: Physical Therapy

## 2015-06-24 ENCOUNTER — Encounter: Payer: Self-pay | Admitting: Physical Therapy

## 2015-06-24 DIAGNOSIS — M25661 Stiffness of right knee, not elsewhere classified: Secondary | ICD-10-CM

## 2015-06-24 DIAGNOSIS — M25561 Pain in right knee: Secondary | ICD-10-CM | POA: Diagnosis not present

## 2015-06-24 DIAGNOSIS — M25461 Effusion, right knee: Secondary | ICD-10-CM | POA: Diagnosis not present

## 2015-06-24 NOTE — Therapy (Signed)
[facility] [address] [place], [place], [address] Phone: [phone]   Fax:  [phone]  Physical Therapy Treatment  Patient Details  Name: Brittany Mercer MRN: [id number] Date of Birth: Dec 04, 1945 Referring Provider: Durene Romans, MD  Encounter Date: 06/24/2015      PT End of Session - 06/24/15 1157    Visit Number 5   Number of Visits 12   Date for PT Re-Evaluation 07/19/15   PT Start Time 1114   PT Stop Time 1157   PT Time Calculation (min) 43 min   Activity Tolerance Patient tolerated treatment well   Behavior During Therapy [facility] for tasks assessed/performed      Past Medical History  Diagnosis Date  . Hypertension   . Hyperlipidemia   . Peripheral neuropathy ([facility])   . Headache   . Arthritis   . 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 Cesarean section      X2  . Tonsillectomy    . Knee surgery      right x2  . Shoulder surgery Left   . Abdominal hysterectomy      partial - 1 ovary removed/ 1 ovary remains  . Total knee arthroplasty  12/2014  . Total knee arthroplasty Right 01/25/2015    Procedure: RIGHT TOTAL KNEE ARTHROPLASTY;  Surgeon: Durene Romans, MD;  Location: [facility];  Service: Orthopedics;  Laterality: Right;    There were no vitals filed for this visit.  Visit Diagnosis:  Knee stiffness, right  Right knee pain  Knee swelling, right      Subjective Assessment - 06/24/15 1125    Subjective Patient reported kne feeling "the same"   Limitations Walking   How long can you sit comfortably? 20-25 minutes initial and up to 2 hours last week   How long can you walk comfortably? patient able to run errands without limitation   Patient Stated Goals Get out of my right knee pain.   Currently in Pain? Yes   Pain Score 2    Pain Location Knee   Pain Orientation Right   Pain Descriptors / Indicators Sore   Pain Type Surgical pain   Pain  Onset More than a month ago   Pain Frequency Constant   Aggravating Factors  ROM   Pain Relieving Factors rest            [facility] PT Assessment - 06/24/15 0001    AROM   Right Knee Extension -15   Right Knee Flexion 101   PROM   Right Knee Extension -12   Right Knee Flexion 109                     [facility] Adult PT Treatment/Exercise - 06/24/15 0001    Knee/Hip Exercises: Aerobic   Nustep L8, seat 12 x15 min to improve R knee extension   Knee/Hip Exercises: Standing   Rocker Board 4 minutes   Manual Therapy   Manual Therapy Passive ROM;Myofascial release   Passive ROM PROM for right knee ext with overpressure using heat post thigh to relax muscle, then gentle flexion stretch                  PT Short Term Goals - 06/17/15 1129    PT SHORT TERM GOAL #1   Title Ind with HEP.   Time 4   Period Weeks  Status Achieved   PT SHORT TERM GOAL #2   Title Full active knee extension in order to normalize gait   Time 4   Period Weeks   Status On-going  AROM -15 degrees 06/17/15           PT Long Term Goals - 06/17/15 1130    PT LONG TERM GOAL #1   Title Active knee flexion to 115 degrees+ so the patient can perform functional tasks and do so with pain not > 2-3/10.   Time 6   Period Weeks   Status On-going  AROM 110 degrees 06/17/15               Plan - 06/24/15 1158    Clinical Impression Statement Patient progressing slowly with ROM due to ongoing stiff and sore knee. Patient has little pain around 2/10 overall. patient has stopped using JAS Brace due to it causing pain and she stretches knee daily with other self flex/ext stretches. Unable to meet any further goals at this time due to ROM deficits.   Pt will benefit from skilled therapeutic intervention in order to improve on the following deficits Abnormal gait;Decreased range of motion;Pain   Rehab Potential Good   PT Frequency 3x / week   PT Duration 4 weeks   PT Treatment/Interventions  Therapeutic activities;Therapeutic exercise;Manual techniques;Electrical Stimulation;Moist Heat;Vasopneumatic Device;Cryotherapy   PT Next Visit Plan Please focus on ther ex and manual techniques (ie:  PROM) to increase patient's right knee range of motion. (MD Charlann Boxer 06/30/15)   Consulted and Agree with Plan of Care Patient        Problem List Patient Active Problem List   Diagnosis Date Noted  . 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 Benign paroxysmal positional vertigo 02/09/2014  . 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 Essential hypertension, benign 07/17/2012  . Hyperlipidemia with target LDL less than 100 07/17/2012  . Migraines 07/17/2012  . Hereditary and idiopathic peripheral neuropathy 07/17/2012  . Hypokalemia 07/17/2012    Justus Duerr P, PTA 06/24/2015, 12:01 PM  [facility] [address] [place], [place], [address] Phone: [phone]   Fax:  [phone]  Name: Brittany Mercer MRN: [id number] Date of Birth: Apr 17, 1945

## 2015-06-28 ENCOUNTER — Encounter: Payer: Self-pay | Admitting: Physical Therapy

## 2015-06-28 ENCOUNTER — Ambulatory Visit: Payer: Medicare Other | Attending: Orthopedic Surgery | Admitting: Physical Therapy

## 2015-06-28 DIAGNOSIS — M25561 Pain in right knee: Secondary | ICD-10-CM | POA: Diagnosis not present

## 2015-06-28 DIAGNOSIS — M25461 Effusion, right knee: Secondary | ICD-10-CM | POA: Diagnosis not present

## 2015-06-28 DIAGNOSIS — M25661 Stiffness of right knee, not elsewhere classified: Secondary | ICD-10-CM

## 2015-06-28 NOTE — Therapy (Addendum)
[facility] [place], [place], [address] Phone: [phone]   Fax:  [phone]  Physical Therapy Treatment  Patient Details  Name: Brittany Mercer MRN: [id number] Date of Birth: 05-24-1945 Referring Provider: Paralee Cancel, MD  Encounter Date: 06/28/2015      PT End of Session - 06/28/15 0819    Visit Number 6   Number of Visits 12   Date for PT Re-Evaluation 07/19/15   PT Start Time 0816   PT Stop Time 0904   PT Time Calculation (min) 48 min   Activity Tolerance Patient tolerated treatment well   Behavior During Therapy [facility] for tasks assessed/performed      Past Medical History  Diagnosis Date  . Hypertension   . Hyperlipidemia   . Peripheral neuropathy ([place])   . Headache   . Arthritis   . 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 Cesarean section      X2  . Tonsillectomy    . Knee surgery      right x2  . Shoulder surgery Left   . Abdominal hysterectomy      partial - 1 ovary removed/ 1 ovary remains  . Total knee arthroplasty  12/2014  . Total knee arthroplasty Right 01/25/2015    Procedure: RIGHT TOTAL KNEE ARTHROPLASTY;  Surgeon: Paralee Cancel, MD;  Location: [facility];  Service: Orthopedics;  Laterality: Right;    There were no vitals filed for this visit.  Visit Diagnosis:  Knee stiffness, right  Right knee pain  Knee swelling, right      Subjective Assessment - 06/28/15 0817    Subjective States that knee is till the same.   Limitations Walking   How long can you sit comfortably? 20-25 minutes initial and up to 2 hours last week   How long can you walk comfortably? patient able to run errands without limitation   Patient Stated Goals Get out of my right knee pain.   Currently in Pain? Other (Comment)  Reports only tenderness but no pain            [facility] PT Assessment - 06/28/15 0001    Assessment   Medical Diagnosis Right  total knee replacement   Onset Date/Surgical Date 01/25/15   Next MD Visit 06/30/2015   Restrictions   Weight Bearing Restrictions No   ROM / Strength   AROM / PROM / Strength AROM;PROM   AROM   Overall AROM  Deficits   AROM Assessment Site Knee   Right/Left Knee Right   Right Knee Extension -15   Right Knee Flexion 113   PROM   Overall PROM  Deficits   PROM Assessment Site Knee   Right/Left Knee Right   Right Knee Extension -13   Right Knee Flexion 117                     [facility] Adult PT Treatment/Exercise - 06/28/15 0001    Knee/Hip Exercises: Aerobic   Nustep L8, seat 12 x15 min to improve R knee extension   Knee/Hip Exercises: Standing   Rocker Board 5 minutes   Manual Therapy   Manual Therapy Passive ROM;Myofascial release   Myofascial Release MFR/STW to R HS and Gastrocnemius to decrease tightness and promote increased ROM   Passive ROM PROM of R knee into ext with holds at end range  PT Education - 06/28/15 0950    Education provided Yes   Education Details HEP- HS and gastroc stretches   Person(s) Educated Patient   Methods Explanation;Verbal cues;Handout;Demonstration   Comprehension Verbalized understanding;Returned demonstration;Verbal cues required          PT Short Term Goals - 06/17/15 1129    PT SHORT TERM GOAL #1   Title Ind with HEP.   Time 4   Period Weeks   Status Achieved   PT SHORT TERM GOAL #2   Title Full active knee extension in order to normalize gait   Time 4   Period Weeks   Status On-going  AROM -15 degrees 06/17/15           PT Long Term Goals - 06/17/15 1130    PT LONG TERM GOAL #1   Title Active knee flexion to 115 degrees+ so the patient can perform functional tasks and do so with pain not > 2-3/10.   Time 6   Period Weeks   Status On-going  AROM 110 degrees 06/17/15               Plan - 06/28/15 0944    Clinical Impression Statement Patient progressing very slowly with R  knee ROM due to on-going stiffness and lack of full ROM. Patient not limited with ADLs and activites at home per patient report only reporting balance deficit due to leg lengths. 18 deg R knee extension noted at rest in supine. R knee extension both AROM and PROM remain similar as they were at prior measurement. R knee flexion AROM and PROM have both improved as is close to meeting AROM R knee flexion goal. Patient accepted new HEP for HS and gastroc stretches at home and verablized understanding. Patient continues to present with increased tightness and trigger points in R HS and gastroc that releases slightly with manual therapy.   Pt will benefit from skilled therapeutic intervention in order to improve on the following deficits Abnormal gait;Decreased range of motion;Pain   Rehab Potential Good   PT Frequency 3x / week   PT Duration 4 weeks   PT Treatment/Interventions Therapeutic activities;Therapeutic exercise;Manual techniques;Electrical Stimulation;Moist Heat;Vasopneumatic Device;Cryotherapy   PT Next Visit Plan Continue with ROM exercises at MD discretion.   Consulted and Agree with Plan of Care Patient        Problem List Patient Active Problem List   Diagnosis Date Noted  . 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 Benign paroxysmal positional vertigo 02/09/2014  . 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 Essential hypertension, benign 07/17/2012  . Hyperlipidemia with target LDL less than 100 07/17/2012  . Migraines 07/17/2012  . Hereditary and idiopathic peripheral neuropathy 07/17/2012  . Hypokalemia 07/17/2012    Ahmed Prima, PTA 06/28/2015 6:53 PM Mali Applegate MPT [facility] [place], [place], [address] Phone: [phone]   Fax:  [phone]  Name: Brittany Mercer MRN: [id number] Date of Birth: February 09, 1946  PHYSICAL THERAPY DISCHARGE SUMMARY  Visits from Start of Care: 6.  Current functional  level related to goals / functional outcomes: See above.   Remaining deficits: Right knee extension deficit.   Education / Equipment: HEP. Plan: Patient agrees to discharge.  Patient goals were not met. Patient is being discharged due to not returning since the last visit.  ?????        Mali Applegate MPT

## 2015-06-28 NOTE — Patient Instructions (Signed)
Stretching: Hamstring (Sitting)    With right leg straight, tuck other foot near groin. Reach down until stretch is felt in back of thigh. Keep back straight. Hold ____ seconds. Repeat ____ times per set. Do ____ sets per session. Do ____ sessions per day.  http://orth.exer.us/660   Copyright  VHI. All rights reserved.  Stretching: Hamstring (Sitting)    With right leg straight, tuck other foot near groin. Reach down until stretch is felt in back of thigh. Keep back straight. Hold _30___ seconds. Repeat _3___ times per set. Do __1__ sets per session. Do __2-3__ sessions per day.  http://orth.exer.us/660   Copyright  VHI. All rights reserved.  Stretching: Calf - Towel    Sit with knee straight and towel looped around left foot. Gently pull on towel until stretch is felt in calf. Hold _30___ seconds. Repeat __3__ times per set. Do __1__ sets per session. Do __2-3__ sessions per day.  http://orth.exer.us/706   Copyright  VHI. All rights reserved.  Stretching: Gastroc    Stand with right foot back, leg straight, forward leg bent. Keeping heel on floor, turned slightly out, lean into wall until stretch is felt in calf. Hold __30__ seconds. Repeat ___3_ times per set. Do __1__ sets per session. Do _2-3___ sessions per day.  http://orth.exer.us/662   Copyright  VHI. All rights reserved.

## 2015-07-06 DIAGNOSIS — G609 Hereditary and idiopathic neuropathy, unspecified: Secondary | ICD-10-CM | POA: Diagnosis not present

## 2015-07-27 ENCOUNTER — Encounter: Payer: Self-pay | Admitting: Family Medicine

## 2015-07-27 ENCOUNTER — Ambulatory Visit (INDEPENDENT_AMBULATORY_CARE_PROVIDER_SITE_OTHER): Payer: Medicare Other | Admitting: Family Medicine

## 2015-07-27 VITALS — BP 116/72 | HR 75 | Temp 98.8°F | Ht 69.0 in | Wt 176.6 lb

## 2015-07-27 DIAGNOSIS — R58 Hemorrhage, not elsewhere classified: Secondary | ICD-10-CM | POA: Diagnosis not present

## 2015-07-27 DIAGNOSIS — N179 Acute kidney failure, unspecified: Secondary | ICD-10-CM

## 2015-07-27 MED ORDER — EZETIMIBE 10 MG PO TABS: 10.0000 mg | ORAL_TABLET | Freq: Every day | ORAL | Status: DC

## 2015-07-27 NOTE — Progress Notes (Signed)
BP 116/72 mmHg  Pulse 75  Temp(Src) 98.8 F (37.1 C) (Oral)  Ht '5\' 9"'  (1.753 m)  Wt 176 lb 9.6 oz (80.105 kg)  BMI 26.07 kg/m2   Subjective:    Patient ID: Brittany Mercer, female    DOB: 25-Apr-1945, 70 y.o.   MRN: [id number]  HPI: Brittany Mercer is a 70 y.o. female presenting on 07/27/2015 for Swelling in left foot and Discuss labwork   HPI Swelling and blue discoloration of left foot Patient has been having swelling and blue discoloration on top of left foot that's been going on for the past few days. She does not recall any specific trauma. She denies any fevers or chills or erythema. It is just more that blue discoloration. She does have a little bit of tenderness there to deep palpation but she is able to bear weight on it and ambulate without too much difficulty.  Laboratory values Patient was concerned because the lab values done at a steady over in weight [place] showed that her kidney function was slightly decreased. When looking back at our lab values they are better than her previous study which was changed for the worse from the previous before that. Her creatinine was 1.18 on these studies and then 3 months ago with [place] it was 1.21. Prior to that 3 months ago about 6 months ago was 0.88. She cannot recall anything changing in that time period between the 2 but I recommended for her to follow-up in 3 months and have her rechecked. She denies any urinary issues or blood in or urine. She denies any UTI symptoms or flank pain.  Relevant past medical, surgical, family and social history reviewed and updated as indicated. Interim medical history since our last visit reviewed. Allergies and medications reviewed and updated.  Review of Systems  Constitutional: Negative for fever and chills.  HENT: Negative for congestion, ear discharge and ear pain.   Eyes: Negative for redness and visual disturbance.  Respiratory: Negative for chest tightness and shortness of breath.     Cardiovascular: Negative for chest pain and leg swelling.  Genitourinary: Negative for dysuria and difficulty urinating.  Musculoskeletal: Negative for back pain and gait problem.  Skin: Positive for color change. Negative for rash and wound.  Neurological: Negative for dizziness, light-headedness and headaches.  Psychiatric/Behavioral: Negative for behavioral problems and agitation.  All other systems reviewed and are negative.   Per HPI unless specifically indicated above     Medication List       This list is accurate as of: 07/27/15  3:39 PM.  Always use your most recent med list.               atenolol 50 MG tablet  Commonly known as:  TENORMIN  Take 1 tablet (50 mg total) by mouth daily.     BIOTIN PO  Take 2 tablets by mouth daily. Gummies.     cyclobenzaprine 5 MG tablet  Commonly known as:  FLEXERIL  Take 1 tablet (5 mg total) by mouth 3 (three) times daily as needed for muscle spasms.     ezetimibe 10 MG tablet  Commonly known as:  ZETIA  Take 1 tablet (10 mg total) by mouth daily. For cholesterol     fenofibrate 160 MG tablet  Take 1 tablet (160 mg total) by mouth daily. For cholesterol and triglyceride     ferrous sulfate 325 (65 FE) MG tablet  Take 1 tablet (325 mg total) by mouth 3 (three) times  daily after meals.     loratadine 10 MG tablet  Commonly known as:  CLARITIN  Take 10 mg by mouth daily as needed for allergies.     multivitamin with minerals Tabs tablet  Take 2 tablets by mouth daily. Reported on 07/27/2015     NUCYNTA 100 MG Tabs  Generic drug:  Tapentadol HCl  Take 1 tablet by mouth. Four to six times daily as needed for pain.     potassium chloride 10 MEQ tablet  Commonly known as:  K-DUR  TAKE 2 TO 3 TABLETS DAILY AS DIRECTED     topiramate 25 MG tablet  Commonly known as:  TOPAMAX  Take 3 tablets (75 mg total) by mouth at bedtime.     triamterene-hydrochlorothiazide 37.5-25 MG tablet  Commonly known as:  MAXZIDE-25  Take 0.5  tablets by mouth daily.           Objective:    BP 116/72 mmHg  Pulse 75  Temp(Src) 98.8 F (37.1 C) (Oral)  Ht '5\' 9"'  (1.753 m)  Wt 176 lb 9.6 oz (80.105 kg)  BMI 26.07 kg/m2  Wt Readings from Last 3 Encounters:  07/27/15 176 lb 9.6 oz (80.105 kg)  04/22/15 169 lb (76.658 kg)  01/25/15 174 lb (78.926 kg)    Physical Exam  Constitutional: She is oriented to person, place, and time. She appears well-developed and well-nourished. No distress.  Eyes: Conjunctivae and EOM are normal. Pupils are equal, round, and reactive to light.  Neck: Neck supple. No thyromegaly present.  Cardiovascular: Normal rate, regular rhythm, normal heart sounds and intact distal pulses.   No murmur heard. Pulmonary/Chest: Effort normal and breath sounds normal. No respiratory distress. She has no wheezes.  Musculoskeletal: Normal range of motion. She exhibits edema (Trace edema on top of left foot). She exhibits no tenderness.  Lymphadenopathy:    She has no cervical adenopathy.  Neurological: She is alert and oriented to person, place, and time. Coordination normal.  Skin: Skin is warm and dry. Ecchymosis (Small patch of bruising overlying distal second third and fourth metatarsal on left foot. Slight tenderness to deep palpation.) noted. No rash noted. She is not diaphoretic.  Psychiatric: She has a normal mood and affect. Her behavior is normal.  Nursing note and vitals reviewed.   Results for orders placed or performed in visit on 04/20/15  CBC with Differential/Platelet  Result Value Ref Range   WBC 10.9 (H) 3.4 - 10.8 x10E3/uL   RBC 5.00 3.77 - 5.28 x10E6/uL   Hemoglobin 15.2 11.1 - 15.9 g/dL   Hematocrit 44.6 34.0 - 46.6 %   MCV 89 79 - 97 fL   MCH 30.4 26.6 - 33.0 pg   MCHC 34.1 31.5 - 35.7 g/dL   RDW 14.8 12.3 - 15.4 %   Platelets 211 150 - 379 x10E3/uL   Neutrophils 73 %   Lymphs 17 %   Monocytes 9 %   Eos 1 %   Basos 0 %   Neutrophils Absolute 8.0 (H) 1.4 - 7.0 x10E3/uL    Lymphocytes Absolute 1.9 0.7 - 3.1 x10E3/uL   Monocytes Absolute 0.9 0.1 - 0.9 x10E3/uL   EOS (ABSOLUTE) 0.1 0.0 - 0.4 x10E3/uL   Basophils Absolute 0.0 0.0 - 0.2 x10E3/uL   Immature Granulocytes 0 %   Immature Grans (Abs) 0.0 0.0 - 0.1 x10E3/uL  CMP14+EGFR  Result Value Ref Range   Glucose 68 65 - 99 mg/dL   BUN 25 8 - 27 mg/dL  Creatinine, Ser 1.21 (H) 0.57 - 1.00 mg/dL   GFR calc non Af Amer 46 (L) >59 mL/min/1.73   GFR calc Af Amer 53 (L) >59 mL/min/1.73   BUN/Creatinine Ratio 21 11 - 26   Sodium 141 134 - 144 mmol/L   Potassium 3.6 3.5 - 5.2 mmol/L   Chloride 101 96 - 106 mmol/L   CO2 24 18 - 29 mmol/L   Calcium 10.0 8.7 - 10.3 mg/dL   Total Protein 6.9 6.0 - 8.5 g/dL   Albumin 4.8 3.6 - 4.8 g/dL   Globulin, Total 2.1 1.5 - 4.5 g/dL   Albumin/Globulin Ratio 2.3 1.1 - 2.5   Bilirubin Total 0.8 0.0 - 1.2 mg/dL   Alkaline Phosphatase 64 39 - 117 IU/L   AST 16 0 - 40 IU/L   ALT 14 0 - 32 IU/L  Lipid panel  Result Value Ref Range   Cholesterol, Total 226 (H) 100 - 199 mg/dL   Triglycerides 142 0 - 149 mg/dL   HDL 46 >39 mg/dL   VLDL Cholesterol Cal 28 5 - 40 mg/dL   LDL Calculated 152 (H) 0 - 99 mg/dL   Chol/HDL Ratio 4.9 (H) 0.0 - 4.4 ratio units      Assessment & Plan:   Problem List Items Addressed This Visit    None    Visit Diagnoses    Ecchymosis    -  Primary    Area on foot appears to be bruised, she does not recall incident, will watch for now should resolve    Acute kidney injury ([place])        Improved from previous, patient has some minor kidney damage        Follow up plan: Return if symptoms worsen or fail to improve.  Counseling provided for all of the vaccine components No orders of the defined types were placed in this encounter.    Caryl Pina, MD [facility] 07/27/2015, 3:39 PM

## 2015-07-29 DIAGNOSIS — M1711 Unilateral primary osteoarthritis, right knee: Secondary | ICD-10-CM | POA: Diagnosis not present

## 2015-07-29 DIAGNOSIS — Z471 Aftercare following joint replacement surgery: Secondary | ICD-10-CM | POA: Diagnosis not present

## 2015-07-29 DIAGNOSIS — Z96651 Presence of right artificial knee joint: Secondary | ICD-10-CM | POA: Diagnosis not present

## 2015-08-03 ENCOUNTER — Encounter: Payer: Self-pay | Admitting: Family Medicine

## 2015-08-30 DIAGNOSIS — H2513 Age-related nuclear cataract, bilateral: Secondary | ICD-10-CM | POA: Diagnosis not present

## 2015-08-30 DIAGNOSIS — H1851 Endothelial corneal dystrophy: Secondary | ICD-10-CM | POA: Diagnosis not present

## 2015-10-01 DIAGNOSIS — H1851 Endothelial corneal dystrophy: Secondary | ICD-10-CM | POA: Diagnosis not present

## 2015-10-01 DIAGNOSIS — H2513 Age-related nuclear cataract, bilateral: Secondary | ICD-10-CM | POA: Diagnosis not present

## 2015-10-01 DIAGNOSIS — H5702 Anisocoria: Secondary | ICD-10-CM | POA: Diagnosis not present

## 2015-10-01 DIAGNOSIS — H5203 Hypermetropia, bilateral: Secondary | ICD-10-CM | POA: Diagnosis not present

## 2015-10-18 DIAGNOSIS — G609 Hereditary and idiopathic neuropathy, unspecified: Secondary | ICD-10-CM | POA: Diagnosis not present

## 2015-10-18 DIAGNOSIS — G8929 Other chronic pain: Secondary | ICD-10-CM | POA: Diagnosis not present

## 2015-10-18 DIAGNOSIS — M170 Bilateral primary osteoarthritis of knee: Secondary | ICD-10-CM | POA: Diagnosis not present

## 2015-10-18 DIAGNOSIS — Z79899 Other long term (current) drug therapy: Secondary | ICD-10-CM | POA: Diagnosis not present

## 2015-10-18 DIAGNOSIS — M25561 Pain in right knee: Secondary | ICD-10-CM | POA: Diagnosis not present

## 2015-10-18 DIAGNOSIS — M25562 Pain in left knee: Secondary | ICD-10-CM | POA: Diagnosis not present

## 2015-10-22 ENCOUNTER — Encounter: Payer: Self-pay | Admitting: Family Medicine

## 2015-10-22 ENCOUNTER — Other Ambulatory Visit: Payer: Self-pay | Admitting: *Deleted

## 2015-10-22 ENCOUNTER — Ambulatory Visit (INDEPENDENT_AMBULATORY_CARE_PROVIDER_SITE_OTHER): Payer: Medicare Other | Admitting: Family Medicine

## 2015-10-22 VITALS — BP 101/62 | HR 64 | Temp 97.6°F | Ht 69.0 in | Wt 180.8 lb

## 2015-10-22 DIAGNOSIS — E559 Vitamin D deficiency, unspecified: Secondary | ICD-10-CM

## 2015-10-22 DIAGNOSIS — I10 Essential (primary) hypertension: Secondary | ICD-10-CM

## 2015-10-22 DIAGNOSIS — Z780 Asymptomatic menopausal state: Secondary | ICD-10-CM | POA: Diagnosis not present

## 2015-10-22 DIAGNOSIS — H8113 Benign paroxysmal vertigo, bilateral: Secondary | ICD-10-CM | POA: Diagnosis not present

## 2015-10-22 DIAGNOSIS — G43809 Other migraine, not intractable, without status migrainosus: Secondary | ICD-10-CM | POA: Diagnosis not present

## 2015-10-22 DIAGNOSIS — Z1211 Encounter for screening for malignant neoplasm of colon: Secondary | ICD-10-CM

## 2015-10-22 DIAGNOSIS — G609 Hereditary and idiopathic neuropathy, unspecified: Secondary | ICD-10-CM | POA: Diagnosis not present

## 2015-10-22 DIAGNOSIS — Z96651 Presence of right artificial knee joint: Secondary | ICD-10-CM

## 2015-10-22 DIAGNOSIS — D279 Benign neoplasm of unspecified ovary: Secondary | ICD-10-CM | POA: Diagnosis not present

## 2015-10-22 DIAGNOSIS — E785 Hyperlipidemia, unspecified: Secondary | ICD-10-CM | POA: Diagnosis not present

## 2015-10-22 MED ORDER — POTASSIUM CHLORIDE ER 10 MEQ PO TBCR: EXTENDED_RELEASE_TABLET | ORAL | 1 refills | Status: DC

## 2015-10-22 MED ORDER — ATENOLOL 50 MG PO TABS: 50.0000 mg | ORAL_TABLET | Freq: Every day | ORAL | 1 refills | Status: DC

## 2015-10-22 MED ORDER — CYCLOBENZAPRINE HCL 5 MG PO TABS: 5.0000 mg | ORAL_TABLET | Freq: Three times a day (TID) | ORAL | 5 refills | Status: DC | PRN

## 2015-10-22 MED ORDER — TRIAMTERENE-HCTZ 37.5-25 MG PO TABS: 0.5000 | ORAL_TABLET | Freq: Every day | ORAL | 1 refills | Status: DC

## 2015-10-22 NOTE — Progress Notes (Signed)
Subjective:  Patient ID: Brittany Mercer, female    DOB: 1946-02-20  Age: 70 y.o. MRN: [id number]  CC: Annual Exam   HPI Brittany Mercer presents for CPE   follow-up of hypertension. Patient has no history of headache chest pain or shortness of breath or recent cough. Patient also denies symptoms of TIA such as numbness weakness lateralizing. Patient checks  blood pressure at home and has not had any elevated readings recently. Patient denies side effects from his medication. States taking it regularly.  Patient in for follow-up of elevated cholesterol. Doing well without complaints on current medication. Denies side effects  including myalgia and arthralgia and nausea. Also in today for liver function testing. Currently no chest pain, shortness of breath or other cardiovascular related symptoms noted.  She continues to have pain visits pain control clinic with moderate relief using opiates.  Patient also states that she has a history of dermoid cyst and see if she generally gets a pelvic exam. She has not had an ultrasound.  2 weeks ago felt dizzy on awakening. Intermittent for 1 week. Unsteady Now resolved. History Brittany Mercer has a past medical history of Arthritis; Headache; Hyperlipidemia; Hypertension; Peripheral neuropathy ([place]); and Sleepwalking.   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   Her family history includes Dementia in her father; Hyperlipidemia in her mother; Hypertension in her brother, father, and mother; Neuropathy in her father; Stroke in her mother.She reports that she quit smoking about 17 years ago. Her smoking use included Cigarettes. She has a 12.50 pack-year smoking history. She does not have any smokeless tobacco history on file. She reports that she drinks alcohol. She reports  that she does not use drugs.    ROS Review of Systems  Constitutional: Negative for appetite change, chills, diaphoresis, fatigue, fever and unexpected weight change.  HENT: Negative for congestion, ear pain, hearing loss, postnasal drip, rhinorrhea, sneezing, sore throat and trouble swallowing.   Eyes: Negative for pain.  Respiratory: Negative for cough, chest tightness and shortness of breath.   Cardiovascular: Negative for chest pain and palpitations.  Gastrointestinal: Negative for abdominal pain, constipation, diarrhea, nausea and vomiting.  Endocrine: Negative for cold intolerance, heat intolerance, polydipsia, polyphagia and polyuria.  Genitourinary: Negative for dysuria, frequency and menstrual problem.  Musculoskeletal: Positive for arthralgias. Negative for joint swelling.  Skin: Negative for rash.  Allergic/Immunologic: Negative for environmental allergies.  Neurological: Positive for dizziness and numbness. Negative for weakness and headaches.  Psychiatric/Behavioral: Negative for agitation and dysphoric mood.    Objective:  BP 101/62 (BP Location: Left Arm, Patient Position: Sitting, Cuff Size: Normal)   Pulse 64   Temp 97.6 F (36.4 C) (Oral)   Ht '5\' 9"'  (1.753 m)   Wt 180 lb 12.8 oz (82 kg)   SpO2 100%   BMI 26.70 kg/m   BP Readings from Last 3 Encounters:  10/22/15 101/62  07/27/15 116/72  04/22/15 99/61    Wt Readings from Last 3 Encounters:  10/22/15 180 lb 12.8 oz (82 kg)  07/27/15 176 lb 9.6 oz (80.1 kg)  04/22/15 169 lb (76.7 kg)     Physical Exam  Constitutional: She is oriented to person, place, and time. She appears well-developed and well-nourished. No distress.  HENT:  Head: Normocephalic and atraumatic.  Right Ear: External ear normal.  Left Ear: External ear normal.  Nose: Nose normal.  Mouth/Throat: Oropharynx  is clear and moist.  Eyes: Conjunctivae and EOM are normal. Pupils are equal, round, and reactive to light.  Neck: Normal range  of motion. Neck supple. No thyromegaly present.  Cardiovascular: Normal rate, regular rhythm and normal heart sounds.   No murmur heard. Pulmonary/Chest: Effort normal and breath sounds normal. No respiratory distress. She has no wheezes. She has no rales. Right breast exhibits no inverted nipple, no mass and no tenderness. Left breast exhibits no inverted nipple, no mass and no tenderness. Breasts are symmetrical.  Abdominal: Soft. Normal appearance and bowel sounds are normal. She exhibits no distension, no abdominal bruit and no mass. There is no splenomegaly or hepatomegaly. There is no tenderness. There is no tenderness at McBurney's point and negative Murphy's sign.  Genitourinary: Rectum normal. Rectal exam shows no external hemorrhoid, no internal hemorrhoid, no mass and no tenderness.  Musculoskeletal: Normal range of motion. She exhibits no edema or tenderness.  Lymphadenopathy:    She has no cervical adenopathy.  Neurological: She is alert and oriented to person, place, and time. She has normal reflexes.  Skin: Skin is warm and dry. No rash noted.  Psychiatric: She has a normal mood and affect. Her behavior is normal. Judgment and thought content normal.     Lab Results  Component Value Date   WBC 5.7 10/22/2015   HGB 12.1 01/26/2015   HCT 40.1 10/22/2015   PLT 169 10/22/2015   GLUCOSE 81 10/22/2015   CHOL 173 10/22/2015   TRIG 112 10/22/2015   HDL 45 10/22/2015   LDLCALC 106 (H) 10/22/2015   ALT 10 10/22/2015   AST 14 10/22/2015   NA 143 10/22/2015   K 4.5 10/22/2015   CL 99 10/22/2015   CREATININE 1.11 (H) 10/22/2015   BUN 25 10/22/2015   CO2 29 10/22/2015   TSH 2.300 10/22/2015   INR 1.02 01/18/2015    No results found.  Assessment & Plan:   Brittany Mercer was seen today for annual exam.  Diagnoses and all orders for this visit:  S/P right TKA -     CBC with Differential/Platelet -     CMP14+EGFR -     TSH  Other type of migraine -     CBC with  Differential/Platelet -     CMP14+EGFR -     TSH  Hyperlipidemia with target LDL less than 100 -     CBC with Differential/Platelet -     CMP14+EGFR -     Lipid panel -     TSH  Hereditary and idiopathic peripheral neuropathy -     CBC with Differential/Platelet -     CMP14+EGFR -     TSH  Essential hypertension, benign -     CBC with Differential/Platelet -     CMP14+EGFR -     TSH  Benign paroxysmal positional vertigo, bilateral -     CBC with Differential/Platelet -     CMP14+EGFR -     TSH  Dermoid cyst of ovary, unspecified laterality -     CBC with Differential/Platelet -     CMP14+EGFR -     TSH -     US Pelvis Complete; Future  Vitamin D deficiency -     CBC with Differential/Platelet -     CMP14+EGFR -     TSH -     VITAMIN D 25 Hydroxy (Vit-D Deficiency, Fractures)  Postmenopausal -     MM Digital Screening; Future  Special screening for malignant neoplasms, colon -  Ambulatory referral to Gastroenterology  Other orders -     Discontinue: cyclobenzaprine (FLEXERIL) 5 MG tablet; Take 1 tablet (5 mg total) by mouth 3 (three) times daily as needed for muscle spasms. -     atenolol (TENORMIN) 50 MG tablet; Take 1 tablet (50 mg total) by mouth daily. -     potassium chloride (K-DUR) 10 MEQ tablet; TAKE 2 TO 3 TABLETS DAILY AS DIRECTED -     triamterene-hydrochlorothiazide (MAXZIDE-25) 37.5-25 MG tablet; Take 0.5 tablets by mouth daily.      I have discontinued Brittany Mercer's multivitamin with minerals, ferrous sulfate, cyclobenzaprine, and ezetimibe. I am also having her maintain her NUCYNTA, loratadine, BIOTIN PO, fenofibrate, topiramate, atenolol, potassium chloride, and triamterene-hydrochlorothiazide.  Meds ordered this encounter  Medications  . DISCONTD: cyclobenzaprine (FLEXERIL) 5 MG tablet    Sig: Take 1 tablet (5 mg total) by mouth 3 (three) times daily as needed for muscle spasms.    Dispense:  30 tablet    Refill:  5  . atenolol  (TENORMIN) 50 MG tablet    Sig: Take 1 tablet (50 mg total) by mouth daily.    Dispense:  90 tablet    Refill:  1  . potassium chloride (K-DUR) 10 MEQ tablet    Sig: TAKE 2 TO 3 TABLETS DAILY AS DIRECTED    Dispense:  180 tablet    Refill:  1  . triamterene-hydrochlorothiazide (MAXZIDE-25) 37.5-25 MG tablet    Sig: Take 0.5 tablets by mouth daily.    Dispense:  90 tablet    Refill:  1     Follow-up: Return in about 6 months (around 04/23/2016).  Claretta Fraise, M.D.

## 2015-10-23 LAB — CBC WITH DIFFERENTIAL/PLATELET
Basophils Absolute: 0 10*3/uL (ref 0.0–0.2)
Basos: 0 %
EOS (ABSOLUTE): 0.3 10*3/uL (ref 0.0–0.4)
Eos: 5 %
Hematocrit: 40.1 % (ref 34.0–46.6)
Hemoglobin: 13.5 g/dL (ref 11.1–15.9)
Immature Grans (Abs): 0 10*3/uL (ref 0.0–0.1)
Immature Granulocytes: 0 %
Lymphocytes Absolute: 1.6 10*3/uL (ref 0.7–3.1)
Lymphs: 28 %
MCH: 31.5 pg (ref 26.6–33.0)
MCHC: 33.7 g/dL (ref 31.5–35.7)
MCV: 94 fL (ref 79–97)
Monocytes Absolute: 0.6 10*3/uL (ref 0.1–0.9)
Monocytes: 10 %
Neutrophils Absolute: 3.2 10*3/uL (ref 1.4–7.0)
Neutrophils: 57 %
Platelets: 169 10*3/uL (ref 150–379)
RBC: 4.29 x10E6/uL (ref 3.77–5.28)
RDW: 13.1 % (ref 12.3–15.4)
WBC: 5.7 10*3/uL (ref 3.4–10.8)

## 2015-10-23 LAB — LIPID PANEL
Chol/HDL Ratio: 3.8 ratio units (ref 0.0–4.4)
Cholesterol, Total: 173 mg/dL (ref 100–199)
HDL: 45 mg/dL (ref >39)
LDL Calculated: 106 mg/dL — ABNORMAL HIGH (ref 0–99)
Triglycerides: 112 mg/dL (ref 0–149)
VLDL Cholesterol Cal: 22 mg/dL (ref 5–40)

## 2015-10-23 LAB — CMP14+EGFR
ALT: 10 IU/L (ref 0–32)
AST: 14 IU/L (ref 0–40)
Albumin/Globulin Ratio: 2.6 — ABNORMAL HIGH (ref 1.2–2.2)
Albumin: 4.6 g/dL (ref 3.6–4.8)
Alkaline Phosphatase: 59 IU/L (ref 39–117)
BUN/Creatinine Ratio: 23 (ref 12–28)
BUN: 25 mg/dL (ref 8–27)
Bilirubin Total: 0.4 mg/dL (ref 0.0–1.2)
CO2: 29 mmol/L (ref 18–29)
Calcium: 9.8 mg/dL (ref 8.7–10.3)
Chloride: 99 mmol/L (ref 96–106)
Creatinine, Ser: 1.11 mg/dL — ABNORMAL HIGH (ref 0.57–1.00)
GFR calc Af Amer: 59 mL/min/{1.73_m2} — ABNORMAL LOW (ref >59)
GFR calc non Af Amer: 51 mL/min/{1.73_m2} — ABNORMAL LOW (ref >59)
Globulin, Total: 1.8 g/dL (ref 1.5–4.5)
Glucose: 81 mg/dL (ref 65–99)
Potassium: 4.5 mmol/L (ref 3.5–5.2)
Sodium: 143 mmol/L (ref 134–144)
Total Protein: 6.4 g/dL (ref 6.0–8.5)

## 2015-10-23 LAB — TSH: TSH: 2.3 u[IU]/mL (ref 0.450–4.500)

## 2015-10-23 LAB — VITAMIN D 25 HYDROXY (VIT D DEFICIENCY, FRACTURES): Vit D, 25-Hydroxy: 15.3 ng/mL — ABNORMAL LOW (ref 30.0–100.0)

## 2015-10-26 ENCOUNTER — Other Ambulatory Visit: Payer: Self-pay | Admitting: Pharmacist

## 2015-10-27 ENCOUNTER — Other Ambulatory Visit: Payer: Self-pay

## 2015-10-27 DIAGNOSIS — D369 Benign neoplasm, unspecified site: Secondary | ICD-10-CM

## 2015-10-28 ENCOUNTER — Encounter: Payer: Self-pay | Admitting: Family Medicine

## 2015-10-29 ENCOUNTER — Telehealth: Payer: Self-pay | Admitting: Family Medicine

## 2015-10-29 DIAGNOSIS — Z139 Encounter for screening, unspecified: Secondary | ICD-10-CM

## 2015-10-29 NOTE — Telephone Encounter (Signed)
I have tried to contact the patient multiple times this evening. There is no answer at either of her numbers. Since I will be out of town this coming week. I would like for her to receive the following message.  I am concerned that a desmoid cyst cannot be adequately evaluated by a simple medical examination. I understand that it has been followed that way in the past. However I believe that there is a better way. Because of that I have ordered an ultrasound of your pelvic organs to better determine if the cyst is a significant health risk for you.  I understand that you are not comfortable with having this exam performed. If that is the case you are certainly under no obligation to go through the exam. I'm sorry about our misunderstanding I will be happy to answer any of your questions when I return to the office the week after next.  Best regards,  Claretta Fraise M.D.

## 2015-11-01 ENCOUNTER — Ambulatory Visit (HOSPITAL_COMMUNITY): Admission: RE | Admit: 2015-11-01 | Payer: Medicare Other | Source: Ambulatory Visit

## 2015-11-01 NOTE — Telephone Encounter (Signed)
Pt does not want to have [place] t this time She will discuss with Dr Livia Snellen at next appt pt did request a mammogram referral Entered in Epic

## 2015-11-09 DIAGNOSIS — L570 Actinic keratosis: Secondary | ICD-10-CM | POA: Diagnosis not present

## 2015-11-09 DIAGNOSIS — Z85828 Personal history of other malignant neoplasm of skin: Secondary | ICD-10-CM | POA: Diagnosis not present

## 2015-11-09 DIAGNOSIS — L821 Other seborrheic keratosis: Secondary | ICD-10-CM | POA: Diagnosis not present

## 2015-11-22 DIAGNOSIS — M170 Bilateral primary osteoarthritis of knee: Secondary | ICD-10-CM | POA: Diagnosis not present

## 2015-11-22 DIAGNOSIS — G609 Hereditary and idiopathic neuropathy, unspecified: Secondary | ICD-10-CM | POA: Diagnosis not present

## 2015-11-22 DIAGNOSIS — F329 Major depressive disorder, single episode, unspecified: Secondary | ICD-10-CM | POA: Diagnosis not present

## 2015-11-22 DIAGNOSIS — M25562 Pain in left knee: Secondary | ICD-10-CM | POA: Diagnosis not present

## 2015-11-22 DIAGNOSIS — G8929 Other chronic pain: Secondary | ICD-10-CM | POA: Diagnosis not present

## 2015-11-22 DIAGNOSIS — Z79899 Other long term (current) drug therapy: Secondary | ICD-10-CM | POA: Diagnosis not present

## 2015-12-20 DIAGNOSIS — M170 Bilateral primary osteoarthritis of knee: Secondary | ICD-10-CM | POA: Diagnosis not present

## 2015-12-20 DIAGNOSIS — F329 Major depressive disorder, single episode, unspecified: Secondary | ICD-10-CM | POA: Diagnosis not present

## 2015-12-20 DIAGNOSIS — G609 Hereditary and idiopathic neuropathy, unspecified: Secondary | ICD-10-CM | POA: Diagnosis not present

## 2015-12-20 DIAGNOSIS — M25562 Pain in left knee: Secondary | ICD-10-CM | POA: Diagnosis not present

## 2015-12-20 DIAGNOSIS — Z79899 Other long term (current) drug therapy: Secondary | ICD-10-CM | POA: Diagnosis not present

## 2015-12-20 DIAGNOSIS — G8929 Other chronic pain: Secondary | ICD-10-CM | POA: Diagnosis not present

## 2015-12-20 DIAGNOSIS — M25561 Pain in right knee: Secondary | ICD-10-CM | POA: Diagnosis not present

## 2015-12-25 ENCOUNTER — Other Ambulatory Visit: Payer: Self-pay | Admitting: Family Medicine

## 2015-12-25 DIAGNOSIS — Z1231 Encounter for screening mammogram for malignant neoplasm of breast: Secondary | ICD-10-CM

## 2016-01-03 ENCOUNTER — Ambulatory Visit
Admission: RE | Admit: 2016-01-03 | Discharge: 2016-01-03 | Disposition: A | Discharge status: Home / Self Care | Payer: Medicare Other | Source: Ambulatory Visit | Attending: Family Medicine | Admitting: Family Medicine

## 2016-01-03 DIAGNOSIS — Z1231 Encounter for screening mammogram for malignant neoplasm of breast: Secondary | ICD-10-CM

## 2016-01-13 ENCOUNTER — Other Ambulatory Visit: Payer: Self-pay | Admitting: Family Medicine

## 2016-01-13 DIAGNOSIS — R928 Other abnormal and inconclusive findings on diagnostic imaging of breast: Secondary | ICD-10-CM

## 2016-01-17 ENCOUNTER — Ambulatory Visit
Admission: RE | Admit: 2016-01-17 | Discharge: 2016-01-17 | Disposition: A | Discharge status: Home / Self Care | Payer: Medicare Other | Source: Ambulatory Visit | Attending: Family Medicine | Admitting: Family Medicine

## 2016-01-17 DIAGNOSIS — R928 Other abnormal and inconclusive findings on diagnostic imaging of breast: Secondary | ICD-10-CM

## 2016-01-20 DIAGNOSIS — G609 Hereditary and idiopathic neuropathy, unspecified: Secondary | ICD-10-CM | POA: Diagnosis not present

## 2016-01-20 DIAGNOSIS — M170 Bilateral primary osteoarthritis of knee: Secondary | ICD-10-CM | POA: Diagnosis not present

## 2016-01-20 DIAGNOSIS — Z79891 Long term (current) use of opiate analgesic: Secondary | ICD-10-CM | POA: Diagnosis not present

## 2016-01-20 DIAGNOSIS — G894 Chronic pain syndrome: Secondary | ICD-10-CM | POA: Diagnosis not present

## 2016-01-27 ENCOUNTER — Ambulatory Visit (INDEPENDENT_AMBULATORY_CARE_PROVIDER_SITE_OTHER): Payer: Medicare Other | Admitting: Family Medicine

## 2016-01-27 ENCOUNTER — Encounter: Payer: Self-pay | Admitting: Family Medicine

## 2016-01-27 VITALS — BP 107/68 | HR 67 | Temp 98.5°F | Ht 69.0 in | Wt 182.5 lb

## 2016-01-27 DIAGNOSIS — R399 Unspecified symptoms and signs involving the genitourinary system: Secondary | ICD-10-CM

## 2016-01-27 DIAGNOSIS — N3289 Other specified disorders of bladder: Secondary | ICD-10-CM

## 2016-01-27 DIAGNOSIS — S0083XA Contusion of other part of head, initial encounter: Secondary | ICD-10-CM | POA: Diagnosis not present

## 2016-01-27 LAB — URINALYSIS, COMPLETE
Bilirubin, UA: NEGATIVE
Glucose, UA: NEGATIVE
Ketones, UA: NEGATIVE
Nitrite, UA: NEGATIVE
Protein, UA: NEGATIVE
RBC, UA: NEGATIVE
Specific Gravity, UA: 1.02 (ref 1.005–1.030)
Urobilinogen, Ur: 0.2 mg/dL (ref 0.2–1.0)
pH, UA: 6 (ref 5.0–7.5)

## 2016-01-27 LAB — MICROSCOPIC EXAMINATION
Bacteria, UA: NONE SEEN
RBC, UA: NONE SEEN /hpf (ref 0–?)

## 2016-01-27 MED ORDER — PHENAZOPYRIDINE HCL 200 MG PO TABS: 200.0000 mg | ORAL_TABLET | Freq: Four times a day (QID) | ORAL | 0 refills | Status: DC | PRN

## 2016-01-27 MED ORDER — NITROFURANTOIN MONOHYD MACRO 100 MG PO CAPS: 100.0000 mg | ORAL_CAPSULE | Freq: Two times a day (BID) | ORAL | 0 refills | Status: DC

## 2016-01-27 NOTE — Progress Notes (Signed)
Subjective:  Patient ID: Brittany Mercer, female    DOB: October 22, 1945  Age: 70 y.o. MRN: [id number]  CC: Urinary Tract Infection (urinary urgency)   HPI Brittany Mercer presents for Urinary frequency and urgency onset about a month ago. She's had no pain or dysuria. There is perhaps a little bit of vague right inguinal pain intermittently.  Denies fever . No flank pain. No nausea, vomiting. Patient states she had a fall 12 days ago. She stumbled on a step on her back. She went down face first and had bleeding from a cut to her nose at the bridge and at the medial aspect of the right brow. She had some bruising. However all of that is resolved but there are some small contusions left that she wonders about needing repair.    History Brittany Mercer has a past medical history of Arthritis; Headache; Hyperlipidemia; Hypertension; Peripheral neuropathy (Brittany Mercer); and Sleepwalking.   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   Her family history includes Dementia in her father; Hyperlipidemia in her mother; Hypertension in her brother, father, and mother; Neuropathy in her father; Stroke in her mother.She reports that she quit smoking about 17 years ago. Her smoking use included Cigarettes. She has a 12.50 pack-year smoking history. She has never used smokeless tobacco. She reports that she drinks alcohol. She reports that she does not use drugs.    ROS Review of Systems  Constitutional: Negative for chills, diaphoresis and fever.  HENT: Negative for congestion.   Eyes: Negative for visual disturbance.  Respiratory: Negative for cough and shortness of breath.   Cardiovascular: Negative for chest pain and palpitations.  Gastrointestinal: Negative for constipation, diarrhea and nausea.  Genitourinary: Positive for dysuria,  frequency and urgency. Negative for decreased urine volume, flank pain, hematuria, menstrual problem and pelvic pain.  Musculoskeletal: Negative for arthralgias and joint swelling.  Skin: Positive for wound. Negative for rash.  Neurological: Negative for dizziness and numbness.    Objective:  BP 107/68   Pulse 67   Temp 98.5 F (36.9 C) (Oral)   Ht 5\' 9"  (1.753 m)   Wt 182 lb 8 oz (82.8 kg)   BMI 26.95 kg/m   BP Readings from Last 3 Encounters:  01/27/16 107/68  10/22/15 101/62  07/27/15 116/72    Wt Readings from Last 3 Encounters:  01/27/16 182 lb 8 oz (82.8 kg)  10/22/15 180 lb 12.8 oz (82 kg)  07/27/15 176 lb 9.6 oz (80.1 kg)     Physical Exam  Constitutional: She is oriented to person, place, and time. She appears well-developed and well-nourished.  HENT:  Head: Normocephalic and atraumatic.  Cardiovascular: Normal rate and regular rhythm.   No murmur heard. Pulmonary/Chest: Effort normal and breath sounds normal.  Abdominal: Soft. Bowel sounds are normal. She exhibits no mass. There is no tenderness. There is no rebound and no guarding.  Musculoskeletal: She exhibits no tenderness.  Neurological: She is alert and oriented to person, place, and time.  Skin: Skin is warm and dry.  There are 2 lacerations on the face one at the medial aspect of the right brow. The other at the bridge of the nose. Both are linear and approximately 1 cm in length. They are freshly healed, but there is skin covering both. No signs of infection.  Psychiatric: She has a normal mood and affect. Her behavior is normal.  Lab Results  Component Value Date   WBC 5.7 10/22/2015   HGB 12.1 01/26/2015   HCT 40.1 10/22/2015   PLT 169 10/22/2015   GLUCOSE 81 10/22/2015   CHOL 173 10/22/2015   TRIG 112 10/22/2015   HDL 45 10/22/2015   LDLCALC 106 (H) 10/22/2015   ALT 10 10/22/2015   AST 14 10/22/2015   NA 143 10/22/2015   K 4.5 10/22/2015   CL 99 10/22/2015   CREATININE 1.11 (H)  10/22/2015   BUN 25 10/22/2015   CO2 29 10/22/2015   TSH 2.300 10/22/2015   INR 1.02 01/18/2015    Mm Diag Breast Tomo Uni Left  Result Date: 01/17/2016 CLINICAL DATA:  Recall from 2D screening mammography, possible subtle distortion in the retroareolar left breast at posterior depth, visualized only on the MLO view. EXAM: 2D DIGITAL DIAGNOSTIC UNILATERAL LEFT MAMMOGRAM WITH CAD AND ADJUNCT TOMO COMPARISON:  01/03/2016, 08/29/2013. ACR Breast Density Category b: There are scattered areas of fibroglandular density. FINDINGS: Standard 2D and tomosynthesis full field CC and MLO views of the left breast were obtained. No persistent architectural distortion or mass in the area of concern in the retroareolar left breast at posterior depth. No findings suspicious for malignancy in the left breast. Mammographic images were processed with CAD. IMPRESSION: No mammographic evidence of malignancy, left breast. RECOMMENDATION: Screening mammogram in one year.(Code:SM-B-01Y) I have discussed the findings and recommendations with the patient. Results were also provided in writing at the conclusion of the visit. If applicable, a reminder letter will be sent to the patient regarding the next appointment. BI-RADS CATEGORY  1: Negative. Electronically Signed   By: Evangeline Dakin M.D.   On: 01/17/2016 11:13    Assessment & Plan:   Brittany Mercer was seen today for urinary tract infection.  Diagnoses and all orders for this visit:  UTI symptoms -     Urinalysis, Complete  Contusion of face, initial encounter  Spasm of bladder  Other orders -     nitrofurantoin, macrocrystal-monohydrate, (MACROBID) 100 MG capsule; Take 1 capsule (100 mg total) by mouth 2 (two) times daily. -     phenazopyridine (PYRIDIUM) 200 MG tablet; Take 1 tablet (200 mg total) by mouth 4 (four) times daily as needed for pain (urinary frequency &/or pain).    I have discontinued Brittany Mercer's NUCYNTA. I am also having her start on  nitrofurantoin (macrocrystal-monohydrate) and phenazopyridine. Additionally, I am having her maintain her loratadine, BIOTIN PO, fenofibrate, topiramate, atenolol, potassium chloride, triamterene-hydrochlorothiazide, cyclobenzaprine, Vitamin D (Ergocalciferol), NUCYNTA ER, and DULoxetine.  Meds ordered this encounter  Medications  . NUCYNTA ER 100 MG 12 hr tablet    Sig: Take 100 mg by mouth 2 (two) times daily.  . DULoxetine (CYMBALTA) 30 MG capsule    Sig: Take 60 mg by mouth daily.  . nitrofurantoin, macrocrystal-monohydrate, (MACROBID) 100 MG capsule    Sig: Take 1 capsule (100 mg total) by mouth 2 (two) times daily.    Dispense:  14 capsule    Refill:  0  . phenazopyridine (PYRIDIUM) 200 MG tablet    Sig: Take 1 tablet (200 mg total) by mouth 4 (four) times daily as needed for pain (urinary frequency &/or pain).    Dispense:  12 tablet    Refill:  0     Follow-up: No Follow-up on file.  Claretta Fraise, M.D.

## 2016-02-12 ENCOUNTER — Ambulatory Visit (INDEPENDENT_AMBULATORY_CARE_PROVIDER_SITE_OTHER): Payer: Medicare Other

## 2016-02-12 DIAGNOSIS — Z23 Encounter for immunization: Secondary | ICD-10-CM | POA: Diagnosis not present

## 2016-03-15 DIAGNOSIS — Z79891 Long term (current) use of opiate analgesic: Secondary | ICD-10-CM | POA: Diagnosis not present

## 2016-03-15 DIAGNOSIS — G609 Hereditary and idiopathic neuropathy, unspecified: Secondary | ICD-10-CM | POA: Diagnosis not present

## 2016-03-15 DIAGNOSIS — M170 Bilateral primary osteoarthritis of knee: Secondary | ICD-10-CM | POA: Diagnosis not present

## 2016-03-15 DIAGNOSIS — G894 Chronic pain syndrome: Secondary | ICD-10-CM | POA: Diagnosis not present

## 2016-03-30 DIAGNOSIS — L720 Epidermal cyst: Secondary | ICD-10-CM | POA: Diagnosis not present

## 2016-03-30 DIAGNOSIS — L0100 Impetigo, unspecified: Secondary | ICD-10-CM | POA: Diagnosis not present

## 2016-03-30 DIAGNOSIS — D485 Neoplasm of uncertain behavior of skin: Secondary | ICD-10-CM | POA: Diagnosis not present

## 2016-04-17 DIAGNOSIS — Z4802 Encounter for removal of sutures: Secondary | ICD-10-CM | POA: Diagnosis not present

## 2016-04-24 ENCOUNTER — Encounter: Payer: Self-pay | Admitting: Family Medicine

## 2016-04-24 ENCOUNTER — Ambulatory Visit (INDEPENDENT_AMBULATORY_CARE_PROVIDER_SITE_OTHER): Payer: Medicare Other | Admitting: Family Medicine

## 2016-04-24 VITALS — BP 93/55 | HR 57 | Temp 98.1°F | Ht 69.0 in | Wt 184.0 lb

## 2016-04-24 DIAGNOSIS — I10 Essential (primary) hypertension: Secondary | ICD-10-CM | POA: Diagnosis not present

## 2016-04-24 DIAGNOSIS — E785 Hyperlipidemia, unspecified: Secondary | ICD-10-CM | POA: Diagnosis not present

## 2016-04-24 DIAGNOSIS — G609 Hereditary and idiopathic neuropathy, unspecified: Secondary | ICD-10-CM | POA: Diagnosis not present

## 2016-04-24 MED ORDER — CYANOCOBALAMIN 1000 MCG/ML IJ SOLN
1000.0000 ug | INTRAMUSCULAR | Status: DC
  Administered 2016-04-24: 1000 ug via INTRAMUSCULAR

## 2016-04-24 NOTE — Progress Notes (Signed)
Subjective:  Patient ID: Brittany Mercer, female    DOB: February 02, 1946  Age: 71 y.o. MRN: [id number]  CC: Hypertension (pt here today for routine follow up on her BP, no concerns voiced)   HPI Brittany Mercer presents for  follow-up of hypertension. Patient has no history of headache chest pain or shortness of breath or recent cough. Patient also denies symptoms of TIA such as numbness weakness lateralizing. Patient checks  blood pressure at home and has not had any elevated readings recently. Patient denies side effects from his medication. States taking it regularly. And states her neuropathy pain is still moderate but at times debilitating. She continues to take Nucynta for that . She had some vitamin B12 problems when she was younger. She is wondering if B-12 might help with her neuropathy in the legs. It is symmetric  History Brittany Mercer has a past medical history of Arthritis; Headache; Hyperlipidemia; Hypertension; Peripheral neuropathy ([place]); and Sleepwalking.   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   Her family history includes Dementia in her father; Hyperlipidemia in her mother; Hypertension in her brother, father, and mother; Neuropathy in her father; Stroke in her mother.She reports that she quit smoking about 17 years ago. Her smoking use included Cigarettes. She has a 12.50 pack-year smoking history. She has never used smokeless tobacco. She reports that she drinks alcohol. She reports that she does not use drugs.    ROS Review of Systems  Constitutional: Negative for activity change, appetite change and fever.  HENT: Negative for congestion, rhinorrhea and sore throat.   Eyes: Negative for visual disturbance.  Respiratory: Negative for cough and shortness of breath.   Cardiovascular: Negative  for chest pain and palpitations.  Gastrointestinal: Negative for abdominal pain, diarrhea and nausea.  Genitourinary: Negative for dysuria.  Musculoskeletal: Negative for arthralgias and myalgias.    Objective:  BP (!) 93/55   Pulse (!) 57   Temp 98.1 F (36.7 C) (Oral)   Ht _0  (1.753 m)   Wt 184 lb (83.5 kg)   BMI 27.17 kg/m   BP Readings from Last 3 Encounters:  04/24/16 (!) 93/55  01/27/16 107/68  10/22/15 101/62    Wt Readings from Last 3 Encounters:  04/24/16 184 lb (83.5 kg)  01/27/16 182 lb 8 oz (82.8 kg)  10/22/15 180 lb 12.8 oz (82 kg)     Physical Exam  Constitutional: She is oriented to person, place, and time. She appears well-developed and well-nourished. No distress.  HENT:  Head: Normocephalic and atraumatic.  Right Ear: External ear normal.  Left Ear: External ear normal.  Nose: Nose normal.  Mouth/Throat: Oropharynx is clear and moist.  Eyes: Conjunctivae and EOM are normal. Pupils are equal, round, and reactive to light.  Neck: Normal range of motion. Neck supple. No thyromegaly present.  Cardiovascular: Normal rate, regular rhythm and normal heart sounds.   No murmur heard. Pulmonary/Chest: Effort normal and breath sounds normal. No respiratory distress. She has no wheezes. She has no rales.  Abdominal: Soft. Bowel sounds are normal. She exhibits no distension. There is no tenderness.  Lymphadenopathy:    She has no cervical adenopathy.  Neurological: She is alert and oriented to person, place, and time. She has normal reflexes.  Skin: Skin is warm and dry.  Psychiatric: She has a normal mood and affect. Her behavior is normal. Judgment and thought  content normal.    Mm Diag Breast Tomo Uni Left  Result Date: 01/17/2016 CLINICAL DATA:  Recall from 2D screening mammography, possible subtle distortion in the retroareolar left breast at posterior depth, visualized only on the MLO view. EXAM: 2D DIGITAL DIAGNOSTIC UNILATERAL LEFT MAMMOGRAM WITH  CAD AND ADJUNCT TOMO COMPARISON:  01/03/2016, 08/29/2013. ACR Breast Density Category b: There are scattered areas of fibroglandular density. FINDINGS: Standard 2D and tomosynthesis full field CC and MLO views of the left breast were obtained. No persistent architectural distortion or mass in the area of concern in the retroareolar left breast at posterior depth. No findings suspicious for malignancy in the left breast. Mammographic images were processed with CAD. IMPRESSION: No mammographic evidence of malignancy, left breast. RECOMMENDATION: Screening mammogram in one year.(Code:SM-B-01Y) I have discussed the findings and recommendations with the patient. Results were also provided in writing at the conclusion of the visit. If applicable, a reminder letter will be sent to the patient regarding the next appointment. BI-RADS CATEGORY  1: Negative. Electronically Signed   By: Evangeline Dakin M.D.   On: 01/17/2016 11:13    Assessment & Plan:   Brittany Mercer was seen today for hypertension.  Diagnoses and all orders for this visit:  Hereditary and idiopathic peripheral neuropathy -     CBC with Differential/Platelet -     CMP14+EGFR -     Vitamin B12 -     TSH -     Folate -     cyanocobalamin ((VITAMIN B-12)) injection 1,000 mcg; Inject 1 mL (1,000 mcg total) into the muscle every 30 (thirty) days.  Essential hypertension, benign -     CBC with Differential/Platelet -     CMP14+EGFR  Hyperlipidemia with target LDL less than 100 -     CMP14+EGFR -     Lipid panel     I have discontinued [name] BIOTIN PO, triamterene-hydrochlorothiazide, Vitamin D (Ergocalciferol), nitrofurantoin (macrocrystal-monohydrate), and phenazopyridine. I am also having her maintain her loratadine, fenofibrate, topiramate, atenolol, potassium chloride, cyclobenzaprine, NUCYNTA ER, DULoxetine, and NUCYNTA. We will continue to administer cyanocobalamin.  Allergies as of 04/24/2016      Reactions   Statins Other  (See Comments)   Myalgia   Penicillins Rash   Pt not sure if she is allergic to penicillin or sulfa. So she does not take either one.  Has patient had a PCN reaction causing immediate rash, facial/tongue/throat swelling, SOB or lightheadedness with hypotension: No Has patient had a PCN reaction causing severe rash involving mucus membranes or skin necrosis: Yes rash on arms.  Has patient had a PCN reaction that required hospitalization No Has patient had a PCN reaction occurring within the last 10 years:No If all of the above answers are "NO",   Sulfa Antibiotics Rash   Pt not sure if she is allergic to sulfa or penicillin so she doesn't take either one.       Medication List       Accurate as of 04/24/16 12:13 PM. Always use your most recent med list.          atenolol 50 MG tablet Commonly known as:  TENORMIN Take 1 tablet (50 mg total) by mouth daily.   cyclobenzaprine 5 MG tablet Commonly known as:  FLEXERIL Take 1 tablet (5 mg total) by mouth 3 (three) times daily as needed for muscle spasms.   DULoxetine 30 MG capsule Commonly known as:  CYMBALTA Take 60 mg by mouth daily.   fenofibrate 160 MG tablet  Take 1 tablet (160 mg total) by mouth daily. For cholesterol and triglyceride   loratadine 10 MG tablet Commonly known as:  CLARITIN Take 10 mg by mouth daily as needed for allergies.   NUCYNTA ER 100 MG 12 hr tablet Generic drug:  tapentadol Take 100 mg by mouth 2 (two) times daily.   NUCYNTA 100 MG Tabs Generic drug:  Tapentadol HCl Take 100 mg by mouth 2 (two) times daily.   potassium chloride 10 MEQ tablet Commonly known as:  K-DUR TAKE 2 TO 3 TABLETS DAILY AS DIRECTED   topiramate 25 MG tablet Commonly known as:  TOPAMAX Take 3 tablets (75 mg total) by mouth at bedtime.        Follow-up: No Follow-up on file.  Claretta Fraise, M.D.

## 2016-04-24 NOTE — Patient Instructions (Signed)
DASH Eating Plan DASH stands for "Dietary Approaches to Stop Hypertension." The DASH eating plan is a healthy eating plan that has been shown to reduce high blood pressure (hypertension). Additional health benefits may include reducing the risk of type 2 diabetes mellitus, heart disease, and stroke. The DASH eating plan may also help with weight loss. What do I need to know about the DASH eating plan? For the DASH eating plan, you will follow these general guidelines:  Choose foods with less than 150 milligrams of sodium per serving (as listed on the food label).  Use salt-free seasonings or herbs instead of table salt or sea salt.  Check with your health care provider or pharmacist before using salt substitutes.  Eat lower-sodium products. These are often labeled as "low-sodium" or "no salt added."  Eat fresh foods. Avoid eating a lot of canned foods.  Eat more vegetables, fruits, and low-fat dairy products.  Choose whole grains. Look for the word "whole" as the first word in the ingredient list.  Choose fish and skinless chicken or turkey more often than red meat. Limit fish, poultry, and meat to 6 oz (170 g) each day.  Limit sweets, desserts, sugars, and sugary drinks.  Choose heart-healthy fats.  Eat more home-cooked food and less restaurant, buffet, and fast food.  Limit fried foods.  Do not fry foods. Cook foods using methods such as baking, boiling, grilling, and broiling instead.  When eating at a restaurant, ask that your food be prepared with less salt, or no salt if possible. What foods can I eat? Seek help from a dietitian for individual calorie needs. Grains  Whole grain or whole wheat bread. Brown rice. Whole grain or whole wheat pasta. Quinoa, bulgur, and whole grain cereals. Low-sodium cereals. Corn or whole wheat flour tortillas. Whole grain cornbread. Whole grain crackers. Low-sodium crackers. Vegetables  Fresh or frozen vegetables (raw, steamed, roasted, or  grilled). Low-sodium or reduced-sodium tomato and vegetable juices. Low-sodium or reduced-sodium tomato sauce and paste. Low-sodium or reduced-sodium canned vegetables. Fruits  All fresh, canned (in natural juice), or frozen fruits. Meat and Other Protein Products  Ground beef (85% or leaner), grass-fed beef, or beef trimmed of fat. Skinless chicken or turkey. Ground chicken or turkey. Pork trimmed of fat. All fish and seafood. Eggs. Dried beans, peas, or lentils. Unsalted nuts and seeds. Unsalted canned beans. Dairy  Low-fat dairy products, such as skim or 1% milk, 2% or reduced-fat cheeses, low-fat ricotta or cottage cheese, or plain low-fat yogurt. Low-sodium or reduced-sodium cheeses. Fats and Oils  Tub margarines without trans fats. Light or reduced-fat mayonnaise and salad dressings (reduced sodium). Avocado. Safflower, olive, or canola oils. Natural peanut or almond butter. Other  Unsalted popcorn and pretzels. The items listed above may not be a complete list of recommended foods or beverages. Contact your dietitian for more options.  What foods are not recommended? Grains  White bread. White pasta. White rice. Refined cornbread. Bagels and croissants. Crackers that contain trans fat. Vegetables  Creamed or fried vegetables. Vegetables in a cheese sauce. Regular canned vegetables. Regular canned tomato sauce and paste. Regular tomato and vegetable juices. Fruits  Canned fruit in light or heavy syrup. Fruit juice. Meat and Other Protein Products  Fatty cuts of meat. Ribs, chicken wings, bacon, sausage, bologna, salami, chitterlings, fatback, hot dogs, bratwurst, and packaged luncheon meats. Salted nuts and seeds. Canned beans with salt. Dairy  Whole or 2% milk, cream, half-and-half, and cream cheese. Whole-fat or sweetened yogurt. Full-fat cheeses   or blue cheese. Nondairy creamers and whipped toppings. Processed cheese, cheese spreads, or cheese curds. Condiments  Onion and garlic  salt, seasoned salt, table salt, and sea salt. Canned and packaged gravies. Worcestershire sauce. Tartar sauce. Barbecue sauce. Teriyaki sauce. Soy sauce, including reduced sodium. Steak sauce. Fish sauce. Oyster sauce. Cocktail sauce. Horseradish. Ketchup and mustard. Meat flavorings and tenderizers. Bouillon cubes. Hot sauce. Tabasco sauce. Marinades. Taco seasonings. Relishes. Fats and Oils  Butter, stick margarine, lard, shortening, ghee, and bacon fat. Coconut, palm kernel, or palm oils. Regular salad dressings. Other  Pickles and olives. Salted popcorn and pretzels. The items listed above may not be a complete list of foods and beverages to avoid. Contact your dietitian for more information.  Where can I find more information? National Heart, [employer], and [employer] Institute: www.nhlbi.nih.gov/health/health-topics/topics/dash/ This information is not intended to replace advice given to you by your health care provider. Make sure you discuss any questions you have with your health care provider. Document Released: 03/02/2011 Document Revised: 08/19/2015 Document Reviewed: 01/15/2013 Elsevier Interactive Patient Education  2017 [employer] Inc[employer]  

## 2016-04-25 LAB — CMP14+EGFR
ALT: 14 IU/L (ref 0–32)
AST: 14 IU/L (ref 0–40)
Albumin/Globulin Ratio: 2.2 (ref 1.2–2.2)
Albumin: 4.3 g/dL (ref 3.5–4.8)
Alkaline Phosphatase: 52 IU/L (ref 39–117)
BUN/Creatinine Ratio: 22 (ref 12–28)
BUN: 26 mg/dL (ref 8–27)
Bilirubin Total: 0.4 mg/dL (ref 0.0–1.2)
CO2: 26 mmol/L (ref 18–29)
Calcium: 9.3 mg/dL (ref 8.7–10.3)
Chloride: 103 mmol/L (ref 96–106)
Creatinine, Ser: 1.18 mg/dL — ABNORMAL HIGH (ref 0.57–1.00)
GFR calc Af Amer: 54 mL/min/{1.73_m2} — ABNORMAL LOW (ref >59)
GFR calc non Af Amer: 47 mL/min/{1.73_m2} — ABNORMAL LOW (ref >59)
Globulin, Total: 2 g/dL (ref 1.5–4.5)
Glucose: 85 mg/dL (ref 65–99)
Potassium: 4.2 mmol/L (ref 3.5–5.2)
Sodium: 143 mmol/L (ref 134–144)
Total Protein: 6.3 g/dL (ref 6.0–8.5)

## 2016-04-25 LAB — CBC WITH DIFFERENTIAL/PLATELET
Basophils Absolute: 0 10*3/uL (ref 0.0–0.2)
Basos: 0 %
EOS (ABSOLUTE): 0.3 10*3/uL (ref 0.0–0.4)
Eos: 5 %
Hematocrit: 40.9 % (ref 34.0–46.6)
Hemoglobin: 13.9 g/dL (ref 11.1–15.9)
Immature Grans (Abs): 0 10*3/uL (ref 0.0–0.1)
Immature Granulocytes: 0 %
Lymphocytes Absolute: 1.9 10*3/uL (ref 0.7–3.1)
Lymphs: 30 %
MCH: 31.6 pg (ref 26.6–33.0)
MCHC: 34 g/dL (ref 31.5–35.7)
MCV: 93 fL (ref 79–97)
Monocytes Absolute: 0.4 10*3/uL (ref 0.1–0.9)
Monocytes: 6 %
Neutrophils Absolute: 3.7 10*3/uL (ref 1.4–7.0)
Neutrophils: 59 %
Platelets: 169 10*3/uL (ref 150–379)
RBC: 4.4 x10E6/uL (ref 3.77–5.28)
RDW: 13.3 % (ref 12.3–15.4)
WBC: 6.4 10*3/uL (ref 3.4–10.8)

## 2016-04-25 LAB — FOLATE: Folate: 11.3 ng/mL (ref >3.0)

## 2016-04-25 LAB — LIPID PANEL
Chol/HDL Ratio: 4.5 ratio units — ABNORMAL HIGH (ref 0.0–4.4)
Cholesterol, Total: 167 mg/dL (ref 100–199)
HDL: 37 mg/dL — ABNORMAL LOW (ref >39)
LDL Calculated: 109 mg/dL — ABNORMAL HIGH (ref 0–99)
Triglycerides: 106 mg/dL (ref 0–149)
VLDL Cholesterol Cal: 21 mg/dL (ref 5–40)

## 2016-04-25 LAB — TSH: TSH: 4.79 u[IU]/mL — ABNORMAL HIGH (ref 0.450–4.500)

## 2016-04-25 LAB — VITAMIN B12: Vitamin B-12: 518 pg/mL (ref 232–1245)

## 2016-05-09 ENCOUNTER — Other Ambulatory Visit: Payer: Self-pay | Admitting: Family Medicine

## 2016-06-13 DIAGNOSIS — G8929 Other chronic pain: Secondary | ICD-10-CM | POA: Diagnosis not present

## 2016-06-13 DIAGNOSIS — Z79891 Long term (current) use of opiate analgesic: Secondary | ICD-10-CM | POA: Diagnosis not present

## 2016-06-13 DIAGNOSIS — M170 Bilateral primary osteoarthritis of knee: Secondary | ICD-10-CM | POA: Diagnosis not present

## 2016-06-13 DIAGNOSIS — G609 Hereditary and idiopathic neuropathy, unspecified: Secondary | ICD-10-CM | POA: Diagnosis not present

## 2016-07-06 ENCOUNTER — Other Ambulatory Visit: Payer: Self-pay | Admitting: Family Medicine

## 2016-08-01 ENCOUNTER — Encounter: Payer: Self-pay | Admitting: Family Medicine

## 2016-08-01 ENCOUNTER — Ambulatory Visit (INDEPENDENT_AMBULATORY_CARE_PROVIDER_SITE_OTHER): Payer: Medicare Other | Admitting: Family Medicine

## 2016-08-01 VITALS — BP 111/63 | HR 57 | Temp 97.9°F | Ht 69.0 in | Wt 188.0 lb

## 2016-08-01 DIAGNOSIS — G5622 Lesion of ulnar nerve, left upper limb: Secondary | ICD-10-CM

## 2016-08-01 DIAGNOSIS — R42 Dizziness and giddiness: Secondary | ICD-10-CM | POA: Diagnosis not present

## 2016-08-01 DIAGNOSIS — M779 Enthesopathy, unspecified: Secondary | ICD-10-CM

## 2016-08-01 MED ORDER — PREDNISONE 10 MG PO TABS: ORAL_TABLET | ORAL | 0 refills | Status: DC

## 2016-08-01 NOTE — Progress Notes (Signed)
Subjective:  Patient ID: Brittany Mercer, female    DOB: [birth year]  Age: 71 y.o. MRN: [id number]  CC: Arm Pain (pt here today c/o left arm pain x 2 months that originates in her hands and shoots up to the shoulder. She is also wanting to discuss her BP med as she is having some swelling in her fingers since coming off Maxide.)   HPI Brittany Mercer presents for Symptoms reviewed and confirmed as above. Patient also says that the pain is a dull ache with shooting sensation. Worsening during the last 2 months. She has full range of motion at the shoulder and the hand but has pain radiating from the upper arm into the elbow and hand. Additionally she denies injury. 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 She has a relationship with a pain clinic visit is prescribing multiple pain-related medications including Nucynta. Patient does not understand Nucynta has a narcotic/opiate base.   History Brittany Mercer has a past medical history of Arthritis; Headache; Hyperlipidemia; Hypertension; Peripheral neuropathy; and Sleepwalking.   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   Her family history includes Dementia in her father; Hyperlipidemia in her mother; Hypertension in her brother, father, and mother; Neuropathy in her father; Stroke in her mother.She reports that she quit smoking about 18 years ago. Her smoking use included Cigarettes. She has a 12.50 pack-year smoking history. She has never used smokeless tobacco. She reports that she drinks alcohol. She reports that she does not use drugs.    ROS Review of Systems  Constitutional: Negative for activity change, appetite change and fever.  HENT: Negative for congestion, rhinorrhea and  sore throat.   Eyes: Negative for visual disturbance.  Respiratory: Negative for cough and shortness of breath.   Cardiovascular: Negative for chest pain and palpitations.  Gastrointestinal: Negative for abdominal pain, diarrhea and nausea. Abdominal distention: LUE.  Genitourinary: Negative for dysuria.  Musculoskeletal: Positive for arthralgias and joint swelling (in hands, see history of present illness). Negative for myalgias.  Neurological: Positive for dizziness and light-headedness.    Objective:  BP 111/63   Pulse (!) 57   Temp 97.9 F (36.6 C) (Oral)   Ht 5\' 9"  (1.753 m)   Wt 188 lb (85.3 kg)   BMI 27.76 kg/m   BP Readings from Last 3 Encounters:  08/01/16 111/63  04/24/16 (!) 93/55  01/27/16 107/68    Wt Readings from Last 3 Encounters:  08/01/16 188 lb (85.3 kg)  04/24/16 184 lb (83.5 kg)  01/27/16 182 lb 8 oz (82.8 kg)     Physical Exam  Constitutional: She appears well-developed and well-nourished.  HENT:  Head: Normocephalic.  Cardiovascular: Normal rate and regular rhythm.   No murmur heard. Pulmonary/Chest: Effort normal and breath sounds normal.  Musculoskeletal: She exhibits tenderness (LEFT ulnar tunnel and lateral epicondyle Painful supination). She exhibits no edema.      Assessment & Plan:   Brittany Mercer was seen today for arm pain.  Diagnoses and all orders for this visit:  Entrapment of left ulnar nerve  Tendonitis  Dizziness  Other orders -     predniSONE (DELTASONE) 10 MG tablet; Take 5 daily for 3 days followed by 4,3,2 and 1 for 3 days each.       I have discontinued  Brittany Mercer's topiramate and NUCYNTA ER. I am also having her start on predniSONE. Additionally, I am having her maintain her loratadine, atenolol, potassium chloride, DULoxetine, NUCYNTA, fenofibrate, and cyclobenzaprine. We will continue to administer cyanocobalamin.  Allergies as of 08/01/2016      Reactions   Statins Other (See Comments)   Myalgia    Penicillins Rash   Pt not sure if she is allergic to penicillin or sulfa. So she does not take either one.  Has patient had a PCN reaction causing immediate rash, facial/tongue/throat swelling, SOB or lightheadedness with hypotension: No Has patient had a PCN reaction causing severe rash involving mucus membranes or skin necrosis: Yes rash on arms.  Has patient had a PCN reaction that required hospitalization No Has patient had a PCN reaction occurring within the last 10 years:No If all of the above answers are "NO",   Sulfa Antibiotics Rash   Pt not sure if she is allergic to sulfa or penicillin so she doesn't take either one.       Medication List       Accurate as of 08/01/16  9:01 AM. Always use your most recent med list.          atenolol 50 MG tablet Commonly known as:  TENORMIN Take 1 tablet (50 mg total) by mouth daily.   cyclobenzaprine 5 MG tablet Commonly known as:  FLEXERIL TAKE 1 TABLET THREE TIMES DAILY AS NEEDED FOR MUSCLE SPASM   DULoxetine 30 MG capsule Commonly known as:  CYMBALTA Take 60 mg by mouth daily.   fenofibrate 160 MG tablet TAKE 1 TABLET DAILY FOR CHOLESTEROL AND TRIGLYCERIDE   loratadine 10 MG tablet Commonly known as:  CLARITIN Take 10 mg by mouth daily as needed for allergies.   NUCYNTA 100 MG Tabs Generic drug:  Tapentadol HCl Take 100 mg by mouth 2 (two) times daily.   potassium chloride 10 MEQ tablet Commonly known as:  K-DUR TAKE 2 TO 3 TABLETS DAILY AS DIRECTED   predniSONE 10 MG tablet Commonly known as:  DELTASONE Take 5 daily for 3 days followed by 4,3,2 and 1 for 3 days each.        Follow-up: Return in about 2 weeks (around 08/15/2016) for dizziness.  Claretta Fraise, M.D.

## 2016-08-08 DIAGNOSIS — G609 Hereditary and idiopathic neuropathy, unspecified: Secondary | ICD-10-CM | POA: Diagnosis not present

## 2016-08-08 DIAGNOSIS — G894 Chronic pain syndrome: Secondary | ICD-10-CM | POA: Diagnosis not present

## 2016-08-08 DIAGNOSIS — Z79891 Long term (current) use of opiate analgesic: Secondary | ICD-10-CM | POA: Diagnosis not present

## 2016-08-15 ENCOUNTER — Ambulatory Visit (INDEPENDENT_AMBULATORY_CARE_PROVIDER_SITE_OTHER): Payer: Medicare Other | Admitting: Family Medicine

## 2016-08-15 ENCOUNTER — Ambulatory Visit: Payer: Medicare Other | Admitting: Family Medicine

## 2016-08-15 ENCOUNTER — Encounter: Payer: Self-pay | Admitting: Family Medicine

## 2016-08-15 VITALS — BP 129/78 | HR 90 | Temp 99.0°F | Ht 69.0 in | Wt 189.0 lb

## 2016-08-15 DIAGNOSIS — D485 Neoplasm of uncertain behavior of skin: Secondary | ICD-10-CM | POA: Diagnosis not present

## 2016-08-15 DIAGNOSIS — R42 Dizziness and giddiness: Secondary | ICD-10-CM | POA: Diagnosis not present

## 2016-08-15 MED ORDER — ATENOLOL 50 MG PO TABS: ORAL_TABLET | ORAL | 1 refills | Status: DC

## 2016-08-15 NOTE — Progress Notes (Signed)
Subjective:  Patient ID: Brittany Mercer, female    DOB: [birth year]  Age: 71 y.o. MRN: [id number]  CC: Follow-up (pt here today following up on her left arm pain which has resolved. She has FROM now and has completed her prednisone.)   HPI Brittany Mercer presents for Now has full range of motion in the arm. She says the prednisone caused her to put on some fluid and a few pounds weight. Although the chart only shows 1 pound increase. The prednisone otherwise agree with her. She says that she is having some dizziness. She notes that she had over the wintertime one episode where she got out of bed and felt so dizzy she had to get down on her hands and knees. She pulled herself in front of the bathroom mirror and although she was standing up straight in the mirror she felt like she was leaning to one side. This lasted about an hour. It is not recurred since. However she has had some lightheadedness noted periodically. This has been self-limited lasting just a few seconds. Usually while she is walking down the hall at home.   History Brittany Mercer has a past medical history of Arthritis; Headache; Hyperlipidemia; Hypertension; Peripheral neuropathy; and Sleepwalking.   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   Her family history includes Dementia in her father; Hyperlipidemia in her mother; Hypertension in her brother, father, and mother; Neuropathy in her father; Stroke in her mother.She reports that she quit smoking about 18 years ago. Her smoking use included Cigarettes. She has a 12.50 pack-year smoking history. She has never used smokeless tobacco. She reports that she drinks alcohol. She reports that she does not use drugs.    ROS Review of Systems  Constitutional: Negative for activity change, appetite change  and fever.  HENT: Negative for congestion, rhinorrhea and sore throat.   Eyes: Negative for visual disturbance.  Respiratory: Negative for cough and shortness of breath.   Cardiovascular: Negative for chest pain and palpitations.  Gastrointestinal: Negative for abdominal pain, diarrhea and nausea.  Genitourinary: Negative for dysuria.  Musculoskeletal: Negative for arthralgias and myalgias.  Neurological: Positive for dizziness.    Objective:  BP 129/78   Pulse 90   Temp 99 F (37.2 C) (Oral)   Ht 5\' 9"  (1.753 m)   Wt 189 lb (85.7 kg)   BMI 27.91 kg/m   BP Readings from Last 3 Encounters:  08/15/16 129/78  08/01/16 111/63  04/24/16 (!) 93/55    Wt Readings from Last 3 Encounters:  08/15/16 189 lb (85.7 kg)  08/01/16 188 lb (85.3 kg)  04/24/16 184 lb (83.5 kg)     Physical Exam  Constitutional: She is oriented to person, place, and time. She appears well-developed and well-nourished. No distress.  HENT:  Head: Normocephalic and atraumatic.  Eyes: Conjunctivae are normal. Pupils are equal, round, and reactive to light.  Neck: Normal range of motion. Neck supple. No thyromegaly present.  Cardiovascular: Normal rate, regular rhythm and normal heart sounds.   No murmur heard. Pulmonary/Chest: Effort normal and breath sounds normal. No respiratory distress. She has no wheezes. She has no rales.  Abdominal: Soft. Bowel sounds are normal. She exhibits no distension. There is no tenderness.  Musculoskeletal: Normal range of motion.  Lymphadenopathy:    She has no cervical adenopathy.  Neurological: She is alert and oriented to person, place,  and time. She has normal reflexes. She displays normal reflexes. No cranial nerve deficit. She exhibits normal muscle tone. Coordination normal.  Skin: Skin is warm and dry. Lesion (located in central chest, between breasts, 3 cm. Not inflamed. Rubbery) noted.  Psychiatric: She has a normal mood and affect. Her behavior is normal. Judgment  and thought content normal.      Assessment & Plan:   Venisha was seen today for follow-up.  Diagnoses and all orders for this visit:  Neoplasm of uncertain behavior of skin -     Ambulatory referral to Plastic Surgery  Dizziness -     atenolol (TENORMIN) 50 MG tablet; Use one half daily for 1 week. Then one half every other day for 1 week. Then discontinue. Monitor blood pressure at home. You can also use pharmacies or the office. Mild to moderate elevations are not going to be harmful. When you follow up in a month we'll revisit blood pressure.       I have discontinued Ms. Lyttle's predniSONE. I have also changed her atenolol. Additionally, I am having her maintain her loratadine, potassium chloride, DULoxetine, NUCYNTA, fenofibrate, and cyclobenzaprine. We will continue to administer cyanocobalamin.  Allergies as of 08/15/2016      Reactions   Statins Other (See Comments)   Myalgia   Penicillins Rash   Pt not sure if she is allergic to penicillin or sulfa. So she does not take either one.  Has patient had a PCN reaction causing immediate rash, facial/tongue/throat swelling, SOB or lightheadedness with hypotension: No Has patient had a PCN reaction causing severe rash involving mucus membranes or skin necrosis: Yes rash on arms.  Has patient had a PCN reaction that required hospitalization No Has patient had a PCN reaction occurring within the last 10 years:No If all of the above answers are "NO",   Sulfa Antibiotics Rash   Pt not sure if she is allergic to sulfa or penicillin so she doesn't take either one.       Medication List       Accurate as of 08/15/16  6:35 PM. Always use your most recent med list.          atenolol 50 MG tablet Commonly known as:  TENORMIN Use one half daily for 1 week. Then one half every other day for 1 week. Then discontinue. Monitor blood pressure at home. You can also use pharmacies or the office. Mild to moderate elevations are not  going to be harmful. When you follow up in a month we'll revisit blood pressure.   cyclobenzaprine 5 MG tablet Commonly known as:  FLEXERIL TAKE 1 TABLET THREE TIMES DAILY AS NEEDED FOR MUSCLE SPASM   DULoxetine 30 MG capsule Commonly known as:  CYMBALTA Take 60 mg by mouth daily.   fenofibrate 160 MG tablet TAKE 1 TABLET DAILY FOR CHOLESTEROL AND TRIGLYCERIDE   loratadine 10 MG tablet Commonly known as:  CLARITIN Take 10 mg by mouth daily as needed for allergies.   NUCYNTA 100 MG Tabs Generic drug:  Tapentadol HCl Take 100 mg by mouth 2 (two) times daily.   potassium chloride 10 MEQ tablet Commonly known as:  K-DUR TAKE 2 TO 3 TABLETS DAILY AS DIRECTED        Follow-up: Return in about 1 month (around 09/15/2016) for hypertension.  Claretta Fraise, M.D.

## 2016-09-05 ENCOUNTER — Other Ambulatory Visit: Payer: Self-pay | Admitting: Family Medicine

## 2016-09-05 ENCOUNTER — Telehealth: Payer: Self-pay | Admitting: Family Medicine

## 2016-09-05 MED ORDER — HYDROXYZINE HCL 25 MG PO TABS: 25.0000 mg | ORAL_TABLET | Freq: Three times a day (TID) | ORAL | 0 refills | Status: DC | PRN

## 2016-09-05 NOTE — Telephone Encounter (Signed)
Patient was seen on 08/15/16 and would like to know if something could be sent in to help with dizziness

## 2016-09-05 NOTE — Telephone Encounter (Signed)
I sent in the requested prescription 

## 2016-09-05 NOTE — Telephone Encounter (Signed)
What symptoms do you have? dizzy  How long have you been sick? This morning  Have you been seen for this problem? Yes, pt was in on 08/15/16 and talked to [facility] about this. She really needs rx for today  If your provider decides to give you a prescription, which pharmacy would you like for it to be sent to? [facility]   Patient informed that this information will be sent to the clinical staff for review and that they should receive a follow up call.

## 2016-09-15 ENCOUNTER — Encounter: Payer: Self-pay | Admitting: Family Medicine

## 2016-09-15 ENCOUNTER — Ambulatory Visit (INDEPENDENT_AMBULATORY_CARE_PROVIDER_SITE_OTHER): Payer: Medicare Other | Admitting: Family Medicine

## 2016-09-15 VITALS — BP 132/72 | HR 97 | Temp 98.8°F | Ht 69.0 in | Wt 188.0 lb

## 2016-09-15 DIAGNOSIS — H9113 Presbycusis, bilateral: Secondary | ICD-10-CM

## 2016-09-15 DIAGNOSIS — R42 Dizziness and giddiness: Secondary | ICD-10-CM

## 2016-09-15 DIAGNOSIS — R2689 Other abnormalities of gait and mobility: Secondary | ICD-10-CM

## 2016-09-15 DIAGNOSIS — I10 Essential (primary) hypertension: Secondary | ICD-10-CM

## 2016-09-15 MED ORDER — METOPROLOL SUCCINATE ER 50 MG PO TB24: 50.0000 mg | ORAL_TABLET | Freq: Every day | ORAL | 2 refills | Status: DC

## 2016-09-15 NOTE — Progress Notes (Signed)
Subjective:  Patient ID: Brittany Mercer, female    DOB: 1945-05-31  Age: 71 y.o. MRN: [id number]  CC: Blood Pressure Check (pt here today for BP check after trying to come off BP meds but her BP has been high all 3 times she checked it after stopping the medication.)   HPI Brittany Mercer presents for  follow-up of hypertension. Patient has no history of headache chest pain or shortness of breath or recent cough. Patient also denies symptoms of TIA such as numbness weakness lateralizing. Patient checks  blood pressure at home and noted elevated readings recently. Readings at home since DC of atenolol are running in the 268T systolic. There has been daily vertigo until two days ago.   Vertigo is described as a room spinning sensation when she bends over. Also occurs every night when she lays down. It only lasts for a few seconds each time. She notes that on occasion when she rolls over to pick something on her night table she'll also have several seconds of vertigo.  She had a very bad episode several months ago which is described in my previous note.  Patient also concerned that she had another fall a few weeks ago and got a black eye. She is concerned about the dangers of falling. Additionally she is concerned about hearing loss. She says that people are sounding more more like they're mumbling and she's having vasculature peaked etc. History Brittany Mercer has a past medical history of Arthritis; Headache; Hyperlipidemia; Hypertension; Peripheral neuropathy; and Sleepwalking.   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   Her family history includes Dementia in her father; Hyperlipidemia in her mother; Hypertension in her brother, father, and mother; Neuropathy in her father; Stroke in her mother.She reports that she  quit smoking about 18 years ago. Her smoking use included Cigarettes. She has a 12.50 pack-year smoking history. She has never used smokeless tobacco. She reports that she drinks alcohol. She reports that she does not use drugs.  Current Outpatient Prescriptions on File Prior to Visit  Medication Sig Dispense Refill  . cyclobenzaprine (FLEXERIL) 5 MG tablet TAKE 1 TABLET THREE TIMES DAILY AS NEEDED FOR MUSCLE SPASM 30 tablet 5  . DULoxetine (CYMBALTA) 30 MG capsule Take 60 mg by mouth daily.    . fenofibrate 160 MG tablet TAKE 1 TABLET DAILY FOR CHOLESTEROL AND TRIGLYCERIDE 90 tablet 1  . hydrOXYzine (ATARAX/VISTARIL) 25 MG tablet Take 1 tablet (25 mg total) by mouth 3 (three) times daily as needed. For dizziness 30 tablet 0  . loratadine (CLARITIN) 10 MG tablet Take 10 mg by mouth daily as needed for allergies.    . NUCYNTA 100 MG TABS Take 100 mg by mouth 2 (two) times daily.    . potassium chloride (K-DUR) 10 MEQ tablet TAKE 2 TO 3 TABLETS DAILY AS DIRECTED 180 tablet 1   Current Facility-Administered Medications on File Prior to Visit  Medication Dose Route Frequency Provider Last Rate Last Dose  . cyanocobalamin ((VITAMIN B-12)) injection 1,000 mcg  1,000 mcg Intramuscular Q30 days Claretta Fraise, MD   1,000 mcg at 04/24/16 1243    ROS Review of Systems  Constitutional: Negative for activity change, appetite change and fever.  HENT: Positive for hearing loss. Negative for congestion, rhinorrhea and sore throat.   Eyes: Negative for visual disturbance.  Respiratory: Negative for cough and shortness of breath.  Cardiovascular: Negative for chest pain and palpitations.  Gastrointestinal: Negative for abdominal pain, diarrhea and nausea.  Genitourinary: Negative for dysuria.  Musculoskeletal: Negative for arthralgias and myalgias.  Neurological: Positive for dizziness.    Objective:  BP 132/72   Pulse 97   Temp 98.8 F (37.1 C) (Oral)   Ht 5\' 9"  (1.753 m)   Wt 188 lb (85.3 kg)    BMI 27.76 kg/m   BP Readings from Last 3 Encounters:  09/15/16 132/72  08/15/16 129/78  08/01/16 111/63    Wt Readings from Last 3 Encounters:  09/15/16 188 lb (85.3 kg)  08/15/16 189 lb (85.7 kg)  08/01/16 188 lb (85.3 kg)     Physical Exam  Constitutional: She is oriented to person, place, and time. She appears well-developed and well-nourished. No distress.  HENT:  Head: Normocephalic and atraumatic.  Right Ear: External ear normal.  Left Ear: External ear normal.  Nose: Nose normal.  Mouth/Throat: Oropharynx is clear and moist.  Eyes: Conjunctivae and EOM are normal. Pupils are equal, round, and reactive to light.  Neck: Normal range of motion. Neck supple. No thyromegaly present.  Cardiovascular: Normal rate, regular rhythm and normal heart sounds.   No murmur heard. Pulmonary/Chest: Effort normal and breath sounds normal. No respiratory distress. She has no wheezes. She has no rales.  Abdominal: Soft. There is no tenderness.  Lymphadenopathy:    She has no cervical adenopathy.  Neurological: She is alert and oriented to person, place, and time. She has normal reflexes.  Skin: Skin is warm and dry.  Psychiatric: She has a normal mood and affect. Her behavior is normal. Judgment and thought content normal.     Lab Results  Component Value Date   WBC 6.4 04/24/2016   HGB 13.9 04/24/2016   HCT 40.9 04/24/2016   PLT 169 04/24/2016   GLUCOSE 85 04/24/2016   CHOL 167 04/24/2016   TRIG 106 04/24/2016   HDL 37 (L) 04/24/2016   LDLCALC 109 (H) 04/24/2016   ALT 14 04/24/2016   AST 14 04/24/2016   NA 143 04/24/2016   K 4.2 04/24/2016   CL 103 04/24/2016   CREATININE 1.18 (H) 04/24/2016   BUN 26 04/24/2016   CO2 26 04/24/2016   TSH 4.790 (H) 04/24/2016   INR 1.02 01/18/2015    Mm Diag Breast Tomo Uni Left  Result Date: 01/17/2016 CLINICAL DATA:  Recall from 2D screening mammography, possible subtle distortion in the retroareolar left breast at posterior  depth, visualized only on the MLO view. EXAM: 2D DIGITAL DIAGNOSTIC UNILATERAL LEFT MAMMOGRAM WITH CAD AND ADJUNCT TOMO COMPARISON:  01/03/2016, 08/29/2013. ACR Breast Density Category b: There are scattered areas of fibroglandular density. FINDINGS: Standard 2D and tomosynthesis full field CC and MLO views of the left breast were obtained. No persistent architectural distortion or mass in the area of concern in the retroareolar left breast at posterior depth. No findings suspicious for malignancy in the left breast. Mammographic images were processed with CAD. IMPRESSION: No mammographic evidence of malignancy, left breast. RECOMMENDATION: Screening mammogram in one year.(Code:SM-B-01Y) I have discussed the findings and recommendations with the patient. Results were also provided in writing at the conclusion of the visit. If applicable, a reminder letter will be sent to the patient regarding the next appointment. BI-RADS CATEGORY  1: Negative. Electronically Signed   By: Evangeline Dakin M.D.   On: 01/17/2016 11:13    Assessment & Plan:   Adryan was seen today for blood pressure check.  Diagnoses and all orders for this visit:  Presbycusis of both ears -     Ambulatory referral to ENT  Vertigo -     Ambulatory referral to Physical Therapy -     Ambulatory referral to ENT  Loss of balance -     Ambulatory referral to Physical Therapy  Essential hypertension, benign  Other orders -     metoprolol succinate (TOPROL-XL) 50 MG 24 hr tablet; Take 1 tablet (50 mg total) by mouth daily. For blood pressure control   I have discontinued Brittany Mercer's atenolol. I am also having her start on metoprolol succinate. Additionally, I am having her maintain her loratadine, potassium chloride, DULoxetine, NUCYNTA, fenofibrate, cyclobenzaprine, and hydrOXYzine. We will continue to administer cyanocobalamin.  Meds ordered this encounter  Medications  . metoprolol succinate (TOPROL-XL) 50 MG 24 hr tablet      Sig: Take 1 tablet (50 mg total) by mouth daily. For blood pressure control    Dispense:  30 tablet    Refill:  2      Follow-up: Return in about 3 months (around 12/16/2016).  Claretta Fraise, M.D.

## 2016-09-20 ENCOUNTER — Ambulatory Visit (INDEPENDENT_AMBULATORY_CARE_PROVIDER_SITE_OTHER): Payer: Medicare Other | Admitting: Family Medicine

## 2016-09-20 ENCOUNTER — Ambulatory Visit (INDEPENDENT_AMBULATORY_CARE_PROVIDER_SITE_OTHER): Payer: Medicare Other

## 2016-09-20 ENCOUNTER — Encounter: Payer: Self-pay | Admitting: Family Medicine

## 2016-09-20 VITALS — BP 118/71 | HR 78 | Temp 100.1°F | Ht 69.0 in | Wt 191.0 lb

## 2016-09-20 DIAGNOSIS — M79642 Pain in left hand: Secondary | ICD-10-CM | POA: Diagnosis not present

## 2016-09-20 DIAGNOSIS — N3090 Cystitis, unspecified without hematuria: Secondary | ICD-10-CM

## 2016-09-20 DIAGNOSIS — W19XXXA Unspecified fall, initial encounter: Secondary | ICD-10-CM

## 2016-09-20 DIAGNOSIS — S60212A Contusion of left wrist, initial encounter: Secondary | ICD-10-CM

## 2016-09-20 DIAGNOSIS — M25532 Pain in left wrist: Secondary | ICD-10-CM

## 2016-09-20 DIAGNOSIS — R2689 Other abnormalities of gait and mobility: Secondary | ICD-10-CM | POA: Diagnosis not present

## 2016-09-20 DIAGNOSIS — R42 Dizziness and giddiness: Secondary | ICD-10-CM

## 2016-09-20 DIAGNOSIS — R300 Dysuria: Secondary | ICD-10-CM | POA: Diagnosis not present

## 2016-09-20 MED ORDER — CIPROFLOXACIN HCL 500 MG PO TABS: 500.0000 mg | ORAL_TABLET | Freq: Two times a day (BID) | ORAL | 0 refills | Status: DC

## 2016-09-20 MED ORDER — PHENAZOPYRIDINE HCL 200 MG PO TABS
200.0000 mg | ORAL_TABLET | Freq: Four times a day (QID) | ORAL | 0 refills | Status: DC | PRN
Start: 2016-09-20 — End: 2016-12-12

## 2016-09-20 NOTE — Progress Notes (Signed)
Subjective:  Patient ID: Brittany Mercer, female    DOB: June 26, 1945  Age: 71 y.o. MRN: [id number]  CC: Fall (pt here today after an episode of vertigo and falling into the bathroom door jam. She is also c/o dysuria.)   HPI Brittany Mercer presents for [place] 3 days ago after experiencing vertigo. She says that when she gets the vertigo it's when she's laying down and turns her head usually. However she did make a sudden movement and turning her head quickly to the side and experienced room spinning lost her balance and fell hitting the door jam. She now has pain and swelling over the left eye. Although she states that vision is normal. There is significant pain in the left wrist with a large hematoma noted as well. When she fell she hyperflexed the wrist rather than hyperextending it. She denies any numbness weakness or tingling.  Depression screen [facility] 2/9 09/20/2016 09/15/2016 08/15/2016  Decreased Interest 0 0 0  Down, Depressed, Hopeless 0 0 0  PHQ - 2 Score 0 0 0  Altered sleeping - - -  Tired, decreased energy - - -  Change in appetite - - -  Feeling bad or failure about yourself  - - -  Trouble concentrating - - -  Moving slowly or fidgety/restless - - -  Suicidal thoughts - - -  PHQ-9 Score - - -  Difficult doing work/chores - - -    History Brittany Mercer has a past medical history of Arthritis; Headache; Hyperlipidemia; Hypertension; Peripheral neuropathy; and Sleepwalking.   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   Her family history includes Dementia in her father; Hyperlipidemia in her mother; Hypertension in her brother, father, and mother; Neuropathy in her father; Stroke in her mother.She reports that she quit smoking about 18 years ago. Her smoking use included Cigarettes. She has a 12.50  pack-year smoking history. She has never used smokeless tobacco. She reports that she drinks alcohol. She reports that she does not use drugs.    ROS Review of Systems  Constitutional: Negative for activity change, appetite change, chills, diaphoresis and fever.  HENT: Negative for congestion, rhinorrhea and sore throat.   Eyes: Negative for visual disturbance.  Respiratory: Negative for cough and shortness of breath.   Cardiovascular: Negative for chest pain and palpitations.  Gastrointestinal: Negative for abdominal pain, constipation, diarrhea and nausea.  Genitourinary: Positive for dysuria, frequency and urgency. Negative for decreased urine volume, flank pain, hematuria, menstrual problem and pelvic pain.  Musculoskeletal: Positive for arthralgias, joint swelling and myalgias.  Skin: Negative for rash.  Neurological: Negative for dizziness and numbness.    Objective:  BP 118/71   Pulse 78   Temp 100.1 F (37.8 C) (Oral)   Ht 5\' 9"  (1.753 m)   Wt 191 lb (86.6 kg)   BMI 28.21 kg/m   BP Readings from Last 3 Encounters:  09/20/16 118/71  09/15/16 132/72  08/15/16 129/78    Wt Readings from Last 3 Encounters:  09/20/16 191 lb (86.6 kg)  09/15/16 188 lb (85.3 kg)  08/15/16 189 lb (85.7 kg)     Physical Exam  Constitutional: She is oriented to person, place, and time. She appears well-developed and well-nourished. No distress.  HENT:  Head: Normocephalic and atraumatic.  Eyes: Conjunctivae are normal. Pupils are equal, round, and reactive to light.  Neck: Normal range of  motion. Neck supple. No thyromegaly present.  Cardiovascular: Normal rate, regular rhythm and normal heart sounds.   No murmur heard. Pulmonary/Chest: Effort normal and breath sounds normal. No respiratory distress. She has no wheezes. She has no rales.  Abdominal: Soft. Bowel sounds are normal. She exhibits no distension. There is no tenderness.  Musculoskeletal: Normal range of motion. She exhibits  tenderness (although she can move the left wrist through a full range of motion, ).  Lymphadenopathy:    She has no cervical adenopathy.  Neurological: She is alert and oriented to person, place, and time.  Skin: Skin is warm and dry.  Psychiatric: She has a normal mood and affect. Her behavior is normal. Judgment and thought content normal.      Assessment & Plan:   Brittany Mercer was seen today for fall.  Diagnoses and all orders for this visit:  Dysuria -     Urinalysis -     Urine Culture  Fall, initial encounter -     DG Hand Complete Left; Future -     DG Wrist Complete Left; Future  Contusion of left wrist, initial encounter  Cystitis  Vertigo  Loss of balance  Other orders -     phenazopyridine (PYRIDIUM) 200 MG tablet; Take 1 tablet (200 mg total) by mouth 4 (four) times daily as needed for pain (urinary frequency &/or pain). -     ciprofloxacin (CIPRO) 500 MG tablet; Take 1 tablet (500 mg total) by mouth 2 (two) times daily.       I am having Brittany Mercer start on phenazopyridine and ciprofloxacin. I am also having her maintain her loratadine, potassium chloride, DULoxetine, NUCYNTA, fenofibrate, cyclobenzaprine, hydrOXYzine, and metoprolol succinate. We will continue to administer cyanocobalamin.  Allergies as of 09/20/2016      Reactions   Statins Other (See Comments)   Myalgia   Penicillins Rash   Pt not sure if she is allergic to penicillin or sulfa. So she does not take either one.  Has patient had a PCN reaction causing immediate rash, facial/tongue/throat swelling, SOB or lightheadedness with hypotension: No Has patient had a PCN reaction causing severe rash involving mucus membranes or skin necrosis: Yes rash on arms.  Has patient had a PCN reaction that required hospitalization No Has patient had a PCN reaction occurring within the last 10 years:No If all of the above answers are "NO",   Sulfa Antibiotics Rash   Pt not sure if she is allergic to sulfa  or penicillin so she doesn't take either one.       Medication List       Accurate as of 09/20/16 11:59 PM. Always use your most recent med list.          ciprofloxacin 500 MG tablet Commonly known as:  CIPRO Take 1 tablet (500 mg total) by mouth 2 (two) times daily.   cyclobenzaprine 5 MG tablet Commonly known as:  FLEXERIL TAKE 1 TABLET THREE TIMES DAILY AS NEEDED FOR MUSCLE SPASM   DULoxetine 30 MG capsule Commonly known as:  CYMBALTA Take 60 mg by mouth daily.   fenofibrate 160 MG tablet TAKE 1 TABLET DAILY FOR CHOLESTEROL AND TRIGLYCERIDE   hydrOXYzine 25 MG tablet Commonly known as:  ATARAX/VISTARIL Take 1 tablet (25 mg total) by mouth 3 (three) times daily as needed. For dizziness   loratadine 10 MG tablet Commonly known as:  CLARITIN Take 10 mg by mouth daily as needed for allergies.   metoprolol succinate 50 MG 24  hr tablet Commonly known as:  TOPROL-XL Take 1 tablet (50 mg total) by mouth daily. For blood pressure control   NUCYNTA 100 MG Tabs Generic drug:  Tapentadol HCl Take 100 mg by mouth 2 (two) times daily.   phenazopyridine 200 MG tablet Commonly known as:  PYRIDIUM Take 1 tablet (200 mg total) by mouth 4 (four) times daily as needed for pain (urinary frequency &/or pain).   potassium chloride 10 MEQ tablet Commonly known as:  K-DUR TAKE 2 TO 3 TABLETS DAILY AS DIRECTED        Follow-up: Return in about 2 weeks (around 10/04/2016), or if symptoms worsen or fail to improve.  Claretta Fraise, M.D.

## 2016-09-22 ENCOUNTER — Other Ambulatory Visit: Payer: Self-pay | Admitting: Family Medicine

## 2016-09-24 LAB — URINE CULTURE

## 2016-10-16 DIAGNOSIS — H9113 Presbycusis, bilateral: Secondary | ICD-10-CM | POA: Diagnosis not present

## 2016-10-16 DIAGNOSIS — K148 Other diseases of tongue: Secondary | ICD-10-CM | POA: Diagnosis not present

## 2016-10-16 DIAGNOSIS — G609 Hereditary and idiopathic neuropathy, unspecified: Secondary | ICD-10-CM | POA: Diagnosis not present

## 2016-10-16 DIAGNOSIS — R42 Dizziness and giddiness: Secondary | ICD-10-CM | POA: Diagnosis not present

## 2016-10-17 DIAGNOSIS — Z79899 Other long term (current) drug therapy: Secondary | ICD-10-CM | POA: Diagnosis not present

## 2016-10-17 DIAGNOSIS — G629 Polyneuropathy, unspecified: Secondary | ICD-10-CM | POA: Diagnosis not present

## 2016-11-07 DIAGNOSIS — L821 Other seborrheic keratosis: Secondary | ICD-10-CM | POA: Diagnosis not present

## 2016-11-07 DIAGNOSIS — L570 Actinic keratosis: Secondary | ICD-10-CM | POA: Diagnosis not present

## 2016-11-07 DIAGNOSIS — D485 Neoplasm of uncertain behavior of skin: Secondary | ICD-10-CM | POA: Diagnosis not present

## 2016-11-07 DIAGNOSIS — Z85828 Personal history of other malignant neoplasm of skin: Secondary | ICD-10-CM | POA: Diagnosis not present

## 2016-11-07 DIAGNOSIS — D0439 Carcinoma in situ of skin of other parts of face: Secondary | ICD-10-CM | POA: Diagnosis not present

## 2016-11-16 DIAGNOSIS — Z79899 Other long term (current) drug therapy: Secondary | ICD-10-CM | POA: Diagnosis not present

## 2016-11-16 DIAGNOSIS — H30899 Other chorioretinal inflammations, unspecified eye: Secondary | ICD-10-CM | POA: Diagnosis not present

## 2016-11-16 DIAGNOSIS — G629 Polyneuropathy, unspecified: Secondary | ICD-10-CM | POA: Diagnosis not present

## 2016-11-23 DIAGNOSIS — C44329 Squamous cell carcinoma of skin of other parts of face: Secondary | ICD-10-CM | POA: Diagnosis not present

## 2016-11-23 DIAGNOSIS — C4442 Squamous cell carcinoma of skin of scalp and neck: Secondary | ICD-10-CM | POA: Diagnosis not present

## 2016-12-04 ENCOUNTER — Other Ambulatory Visit: Payer: Self-pay | Admitting: Family Medicine

## 2016-12-12 ENCOUNTER — Ambulatory Visit (INDEPENDENT_AMBULATORY_CARE_PROVIDER_SITE_OTHER): Payer: Medicare Other | Admitting: Family Medicine

## 2016-12-12 ENCOUNTER — Encounter: Payer: Self-pay | Admitting: Family Medicine

## 2016-12-12 VITALS — BP 126/78 | HR 94 | Temp 97.8°F | Ht 69.0 in | Wt 190.0 lb

## 2016-12-12 DIAGNOSIS — E876 Hypokalemia: Secondary | ICD-10-CM

## 2016-12-12 DIAGNOSIS — E785 Hyperlipidemia, unspecified: Secondary | ICD-10-CM | POA: Diagnosis not present

## 2016-12-12 DIAGNOSIS — I10 Essential (primary) hypertension: Secondary | ICD-10-CM | POA: Diagnosis not present

## 2016-12-12 MED ORDER — FENOFIBRATE 160 MG PO TABS: ORAL_TABLET | ORAL | 1 refills | Status: DC

## 2016-12-12 MED ORDER — POTASSIUM CHLORIDE ER 10 MEQ PO TBCR: EXTENDED_RELEASE_TABLET | ORAL | 0 refills | Status: DC

## 2016-12-12 MED ORDER — CYCLOBENZAPRINE HCL 5 MG PO TABS: ORAL_TABLET | ORAL | 5 refills | Status: DC

## 2016-12-12 MED ORDER — DULOXETINE HCL 30 MG PO CPEP: ORAL_CAPSULE | ORAL | 0 refills | Status: DC

## 2016-12-12 MED ORDER — METOPROLOL SUCCINATE ER 50 MG PO TB24: 50.0000 mg | ORAL_TABLET | Freq: Every day | ORAL | 2 refills | Status: DC

## 2016-12-12 NOTE — Progress Notes (Signed)
Subjective:  Patient ID: Brittany Mercer, female    DOB: November 07, 1945  Age: 71 y.o. MRN: [id number]  CC: Hypertension (pt here today for follow up of her chronic medical conditions and also c/o increased BP and wants to discuss medications. She is also still having pain in left hand.)    HPI   Brittany Mercer presents for concern that her BP has been igh on multiple readings at home. Wonders why she was switched to metoprolol from atenolol, since atenolol was working well. Having some sharp pain in the dorsum of the left hand linearly from 3rd MCP to wrist. Not bad enough for referral or treatment.  Depression screen [facility] 2/9 12/12/2016 09/20/2016 09/15/2016  Decreased Interest 0 0 0  Down, Depressed, Hopeless 0 0 0  PHQ - 2 Score 0 0 0  Altered sleeping - - -  Tired, decreased energy - - -  Change in appetite - - -  Feeling bad or failure about yourself  - - -  Trouble concentrating - - -  Moving slowly or fidgety/restless - - -  Suicidal thoughts - - -  PHQ-9 Score - - -  Difficult doing work/chores - - -    History Brittany Mercer has a past medical history of Arthritis; Headache; Hyperlipidemia; Hypertension; Peripheral neuropathy; and Sleepwalking.   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   Her family history includes Dementia in her father; Hyperlipidemia in her mother; Hypertension in her brother, father, and mother; Neuropathy in her father; Stroke in her mother.She reports that she quit smoking about 18 years ago. Her smoking use included Cigarettes. She has a 12.50 pack-year smoking history. She has never used smokeless tobacco. She reports that she drinks alcohol. She reports that she does not use drugs.    ROS Review of Systems  Constitutional: Negative for activity change, appetite change and  fever.  HENT: Negative for congestion, rhinorrhea and sore throat.   Eyes: Negative for visual disturbance.  Respiratory: Negative for cough and shortness of breath.   Cardiovascular: Negative for chest pain and palpitations.  Gastrointestinal: Negative for abdominal pain, diarrhea and nausea.  Genitourinary: Negative for dysuria.  Musculoskeletal: Negative for arthralgias and myalgias.    Objective:  BP 126/78   Pulse 94   Temp 97.8 F (36.6 C) (Oral)   Ht _0  (1.753 m)   Wt 190 lb (86.2 kg)   BMI 28.06 kg/m   BP Readings from Last 3 Encounters:  12/12/16 126/78  09/20/16 118/71  09/15/16 132/72    Wt Readings from Last 3 Encounters:  12/12/16 190 lb (86.2 kg)  09/20/16 191 lb (86.6 kg)  09/15/16 188 lb (85.3 kg)     Physical Exam  Constitutional: She is oriented to person, place, and time. She appears well-developed and well-nourished. No distress.  HENT:  Head: Normocephalic and atraumatic.  Eyes: Pupils are equal, round, and reactive to light. Conjunctivae are normal.  Neck: Normal range of motion. Neck supple. No thyromegaly present.  Cardiovascular: Normal rate, regular rhythm and normal heart sounds.   No murmur heard. Pulmonary/Chest: Effort normal and breath sounds normal. No respiratory distress. She has no wheezes. She has no rales.  Abdominal: Soft. Bowel sounds are normal. She exhibits no distension. There is no tenderness.  Musculoskeletal: Normal range of motion.  Lymphadenopathy:    She has no cervical adenopathy.  Neurological: She  is alert and oriented to person, place, and time.  Skin: Skin is warm and dry.  Psychiatric: She has a normal mood and affect. Her behavior is normal. Judgment and thought content normal.      Assessment & Plan:   Brittany Mercer was seen today for hypertension.  Diagnoses and all orders for this visit:  Essential hypertension, benign -     CBC with Differential/Platelet -     CMP14+EGFR  Hyperlipidemia with target  LDL less than 100  Hypokalemia  Other orders -     DULoxetine (CYMBALTA) 30 MG capsule; Take 1 daily x 1 week then 1 every other day x 1week and then stop taking. -     cyclobenzaprine (FLEXERIL) 5 MG tablet; TAKE 1 TABLET THREE TIMES DAILY AS NEEDED FOR MUSCLE SPASM -     fenofibrate 160 MG tablet; TAKE 1 TABLET DAILY FOR CHOLESTEROL AND TRIGLYCERIDE -     metoprolol succinate (TOPROL-XL) 50 MG 24 hr tablet; Take 1 tablet (50 mg total) by mouth daily. For blood pressure control -     potassium chloride (K-DUR) 10 MEQ tablet; TAKE 2 TO 3 TABLETS DAILY AS DIRECTED       I have discontinued [name]'s DULoxetine, hydrOXYzine, phenazopyridine, and ciprofloxacin. I am also having her start on DULoxetine. Additionally, I am having her maintain her loratadine, NUCYNTA, cyclobenzaprine, fenofibrate, metoprolol succinate, and potassium chloride. We will continue to administer cyanocobalamin.  Allergies as of 12/12/2016      Reactions   Statins Other (See Comments)   Myalgia   Penicillins Rash   Pt not sure if she is allergic to penicillin or sulfa. So she does not take either one.  Has patient had a PCN reaction causing immediate rash, facial/tongue/throat swelling, SOB or lightheadedness with hypotension: No Has patient had a PCN reaction causing severe rash involving mucus membranes or skin necrosis: Yes rash on arms.  Has patient had a PCN reaction that required hospitalization No Has patient had a PCN reaction occurring within the last 10 years:No If all of the above answers are "NO",   Sulfa Antibiotics Rash   Pt not sure if she is allergic to sulfa or penicillin so she doesn't take either one.       Medication List       Accurate as of 12/12/16 11:23 PM. Always use your most recent med list.          cyclobenzaprine 5 MG tablet Commonly known as:  FLEXERIL TAKE 1 TABLET THREE TIMES DAILY AS NEEDED FOR MUSCLE SPASM   DULoxetine 30 MG capsule Commonly known as:   CYMBALTA Take 1 daily x 1 week then 1 every other day x 1week and then stop taking.   fenofibrate 160 MG tablet TAKE 1 TABLET DAILY FOR CHOLESTEROL AND TRIGLYCERIDE   loratadine 10 MG tablet Commonly known as:  CLARITIN Take 10 mg by mouth daily as needed for allergies.   metoprolol succinate 50 MG 24 hr tablet Commonly known as:  TOPROL-XL Take 1 tablet (50 mg total) by mouth daily. For blood pressure control   NUCYNTA 100 MG Tabs Generic drug:  Tapentadol HCl Take 100 mg by mouth 2 (two) times daily.   potassium chloride 10 MEQ tablet Commonly known as:  K-DUR TAKE 2 TO 3 TABLETS DAILY AS DIRECTED            Discharge Care Instructions        Start     Ordered   12/12/16 0000  CBC with Differential/Platelet     12/12/16 1503   12/12/16 0000  CMP14+EGFR     12/12/16 1503   12/12/16 0000  DULoxetine (CYMBALTA) 30 MG capsule     12/12/16 1503   12/12/16 0000  cyclobenzaprine (FLEXERIL) 5 MG tablet     12/12/16 1504   12/12/16 0000  fenofibrate 160 MG tablet     12/12/16 1504   12/12/16 0000  metoprolol succinate (TOPROL-XL) 50 MG 24 hr tablet  Daily     12/12/16 1504   12/12/16 0000  potassium chloride (K-DUR) 10 MEQ tablet     12/12/16 1504       Follow-up: Return in about 3 months (around 03/13/2017).  Claretta Fraise, M.D.

## 2016-12-13 LAB — CMP14+EGFR
ALT: 16 IU/L (ref 0–32)
AST: 16 IU/L (ref 0–40)
Albumin/Globulin Ratio: 2.3 — ABNORMAL HIGH (ref 1.2–2.2)
Albumin: 4.9 g/dL — ABNORMAL HIGH (ref 3.5–4.8)
Alkaline Phosphatase: 62 IU/L (ref 39–117)
BUN/Creatinine Ratio: 23 (ref 12–28)
BUN: 23 mg/dL (ref 8–27)
Bilirubin Total: 0.7 mg/dL (ref 0.0–1.2)
CO2: 22 mmol/L (ref 20–29)
Calcium: 9.9 mg/dL (ref 8.7–10.3)
Chloride: 107 mmol/L — ABNORMAL HIGH (ref 96–106)
Creatinine, Ser: 1.01 mg/dL — ABNORMAL HIGH (ref 0.57–1.00)
GFR calc Af Amer: 65 mL/min/{1.73_m2} (ref >59)
GFR calc non Af Amer: 57 mL/min/{1.73_m2} — ABNORMAL LOW (ref >59)
Globulin, Total: 2.1 g/dL (ref 1.5–4.5)
Glucose: 86 mg/dL (ref 65–99)
Potassium: 4.3 mmol/L (ref 3.5–5.2)
Sodium: 144 mmol/L (ref 134–144)
Total Protein: 7 g/dL (ref 6.0–8.5)

## 2016-12-13 LAB — CBC WITH DIFFERENTIAL/PLATELET
Basophils Absolute: 0 10*3/uL (ref 0.0–0.2)
Basos: 1 %
EOS (ABSOLUTE): 0.3 10*3/uL (ref 0.0–0.4)
Eos: 3 %
Hematocrit: 43.9 % (ref 34.0–46.6)
Hemoglobin: 15.3 g/dL (ref 11.1–15.9)
Immature Grans (Abs): 0 10*3/uL (ref 0.0–0.1)
Immature Granulocytes: 0 %
Lymphocytes Absolute: 1.8 10*3/uL (ref 0.7–3.1)
Lymphs: 22 %
MCH: 32 pg (ref 26.6–33.0)
MCHC: 34.9 g/dL (ref 31.5–35.7)
MCV: 92 fL (ref 79–97)
Monocytes Absolute: 0.7 10*3/uL (ref 0.1–0.9)
Monocytes: 9 %
Neutrophils Absolute: 5.5 10*3/uL (ref 1.4–7.0)
Neutrophils: 65 %
Platelets: 225 10*3/uL (ref 150–379)
RBC: 4.78 x10E6/uL (ref 3.77–5.28)
RDW: 13.9 % (ref 12.3–15.4)
WBC: 8.2 10*3/uL (ref 3.4–10.8)

## 2016-12-18 ENCOUNTER — Ambulatory Visit: Payer: Medicare Other | Admitting: Family Medicine

## 2016-12-18 DIAGNOSIS — Z79899 Other long term (current) drug therapy: Secondary | ICD-10-CM | POA: Diagnosis not present

## 2016-12-18 DIAGNOSIS — G609 Hereditary and idiopathic neuropathy, unspecified: Secondary | ICD-10-CM | POA: Diagnosis not present

## 2017-01-09 ENCOUNTER — Other Ambulatory Visit: Payer: Self-pay | Admitting: Family Medicine

## 2017-01-09 DIAGNOSIS — Z1231 Encounter for screening mammogram for malignant neoplasm of breast: Secondary | ICD-10-CM

## 2017-01-15 DIAGNOSIS — Z23 Encounter for immunization: Secondary | ICD-10-CM | POA: Diagnosis not present

## 2017-01-15 DIAGNOSIS — G609 Hereditary and idiopathic neuropathy, unspecified: Secondary | ICD-10-CM | POA: Diagnosis not present

## 2017-01-15 DIAGNOSIS — Z79899 Other long term (current) drug therapy: Secondary | ICD-10-CM | POA: Diagnosis not present

## 2017-02-02 ENCOUNTER — Ambulatory Visit: Payer: Medicare Other

## 2017-02-12 DIAGNOSIS — L988 Other specified disorders of the skin and subcutaneous tissue: Secondary | ICD-10-CM | POA: Diagnosis not present

## 2017-02-12 DIAGNOSIS — C44329 Squamous cell carcinoma of skin of other parts of face: Secondary | ICD-10-CM | POA: Diagnosis not present

## 2017-02-14 DIAGNOSIS — Z79899 Other long term (current) drug therapy: Secondary | ICD-10-CM | POA: Diagnosis not present

## 2017-02-14 DIAGNOSIS — G609 Hereditary and idiopathic neuropathy, unspecified: Secondary | ICD-10-CM | POA: Diagnosis not present

## 2017-02-20 DIAGNOSIS — Z4802 Encounter for removal of sutures: Secondary | ICD-10-CM | POA: Diagnosis not present

## 2017-03-02 ENCOUNTER — Ambulatory Visit
Admission: RE | Admit: 2017-03-02 | Discharge: 2017-03-02 | Disposition: A | Discharge status: Home / Self Care | Payer: Medicare Other | Source: Ambulatory Visit | Attending: Family Medicine | Admitting: Family Medicine

## 2017-03-02 DIAGNOSIS — Z1231 Encounter for screening mammogram for malignant neoplasm of breast: Secondary | ICD-10-CM | POA: Diagnosis not present

## 2017-03-06 ENCOUNTER — Other Ambulatory Visit: Payer: Self-pay | Admitting: Family Medicine

## 2017-03-06 DIAGNOSIS — R928 Other abnormal and inconclusive findings on diagnostic imaging of breast: Secondary | ICD-10-CM

## 2017-03-09 ENCOUNTER — Ambulatory Visit
Admission: RE | Admit: 2017-03-09 | Discharge: 2017-03-09 | Disposition: A | Discharge status: Home / Self Care | Payer: Medicare Other | Source: Ambulatory Visit | Attending: Family Medicine | Admitting: Family Medicine

## 2017-03-09 DIAGNOSIS — R928 Other abnormal and inconclusive findings on diagnostic imaging of breast: Secondary | ICD-10-CM

## 2017-03-09 DIAGNOSIS — N6489 Other specified disorders of breast: Secondary | ICD-10-CM | POA: Diagnosis not present

## 2017-03-13 ENCOUNTER — Ambulatory Visit: Payer: Medicare Other | Admitting: Family Medicine

## 2017-03-13 DIAGNOSIS — G609 Hereditary and idiopathic neuropathy, unspecified: Secondary | ICD-10-CM | POA: Diagnosis not present

## 2017-03-13 DIAGNOSIS — Z79899 Other long term (current) drug therapy: Secondary | ICD-10-CM | POA: Diagnosis not present

## 2017-04-10 DIAGNOSIS — Z79899 Other long term (current) drug therapy: Secondary | ICD-10-CM | POA: Diagnosis not present

## 2017-04-10 DIAGNOSIS — G609 Hereditary and idiopathic neuropathy, unspecified: Secondary | ICD-10-CM | POA: Diagnosis not present

## 2017-04-11 DIAGNOSIS — H2513 Age-related nuclear cataract, bilateral: Secondary | ICD-10-CM | POA: Diagnosis not present

## 2017-04-11 DIAGNOSIS — H52223 Regular astigmatism, bilateral: Secondary | ICD-10-CM | POA: Diagnosis not present

## 2017-04-11 DIAGNOSIS — H1851 Endothelial corneal dystrophy: Secondary | ICD-10-CM | POA: Diagnosis not present

## 2017-04-11 DIAGNOSIS — H5203 Hypermetropia, bilateral: Secondary | ICD-10-CM | POA: Diagnosis not present

## 2017-04-29 IMAGING — CR DG KNEE 1-2V*R*
2 series · 2 of 2 positions shown · non-contrast
Comparison: None.

CLINICAL DATA: 69-year-old female with right knee pain (not
otherwise specified at the time of this report).

EXAM:
RIGHT KNEE - 1-2 VIEW

[view not recorded (1 of 2)]
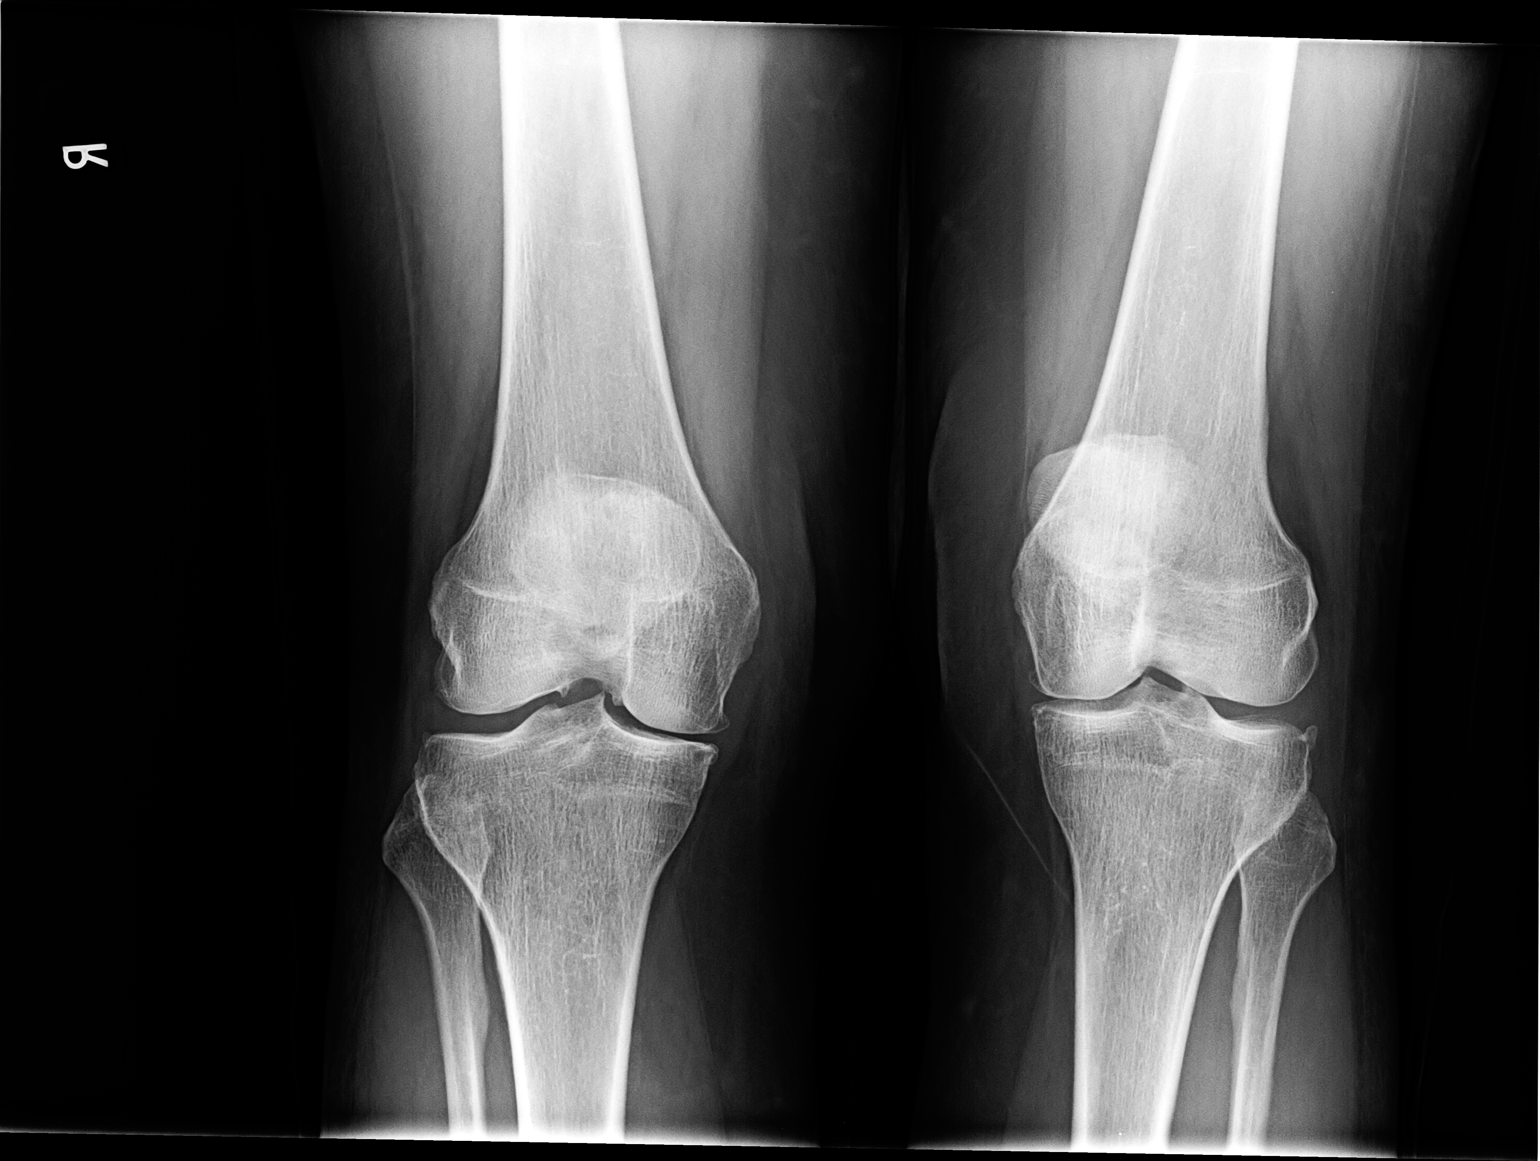

[view not recorded (2 of 2)]
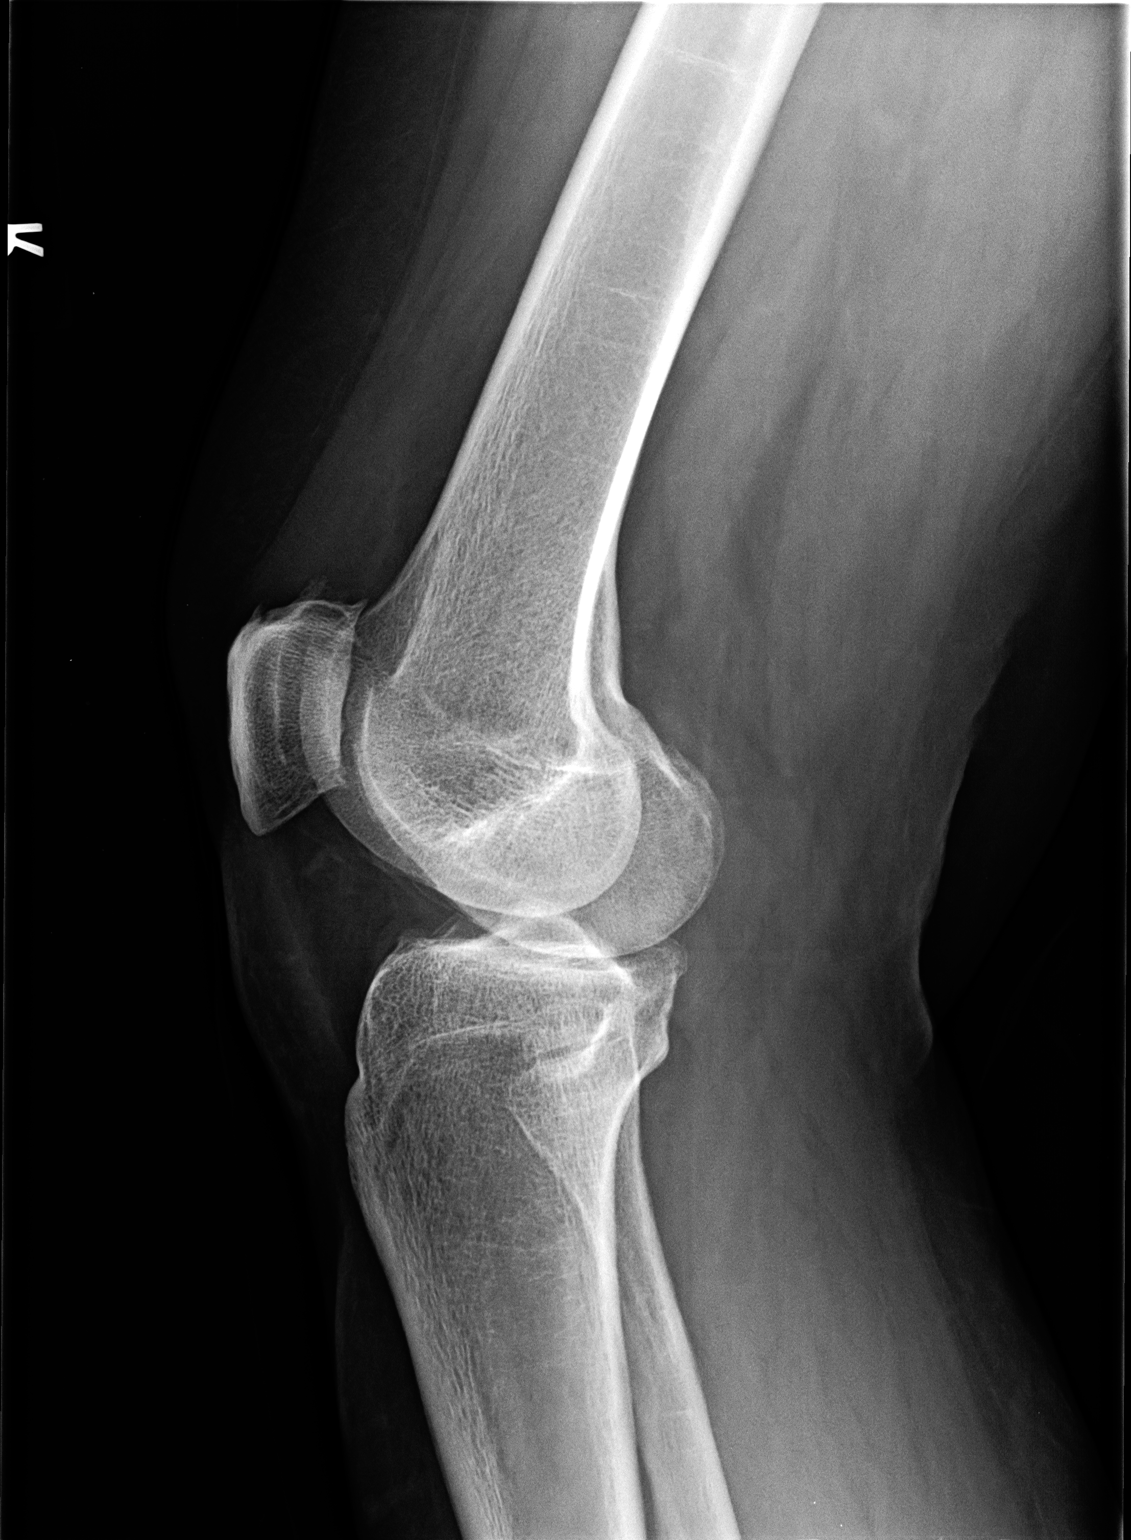

[2 of 2 positions shown; findings below may reference images not displayed]

FINDINGS: Upright views of both knees. Severe medial compartment joint space
loss bilaterally. Tricompartmental degenerative spurring on the
right. No definite joint effusion. No acute osseous abnormality
identified.
IMPRESSION: Tricompartmental degenerative changes of the right knee, most severe
in the medial compartment. No acute osseous abnormality identified.

## 2017-05-08 ENCOUNTER — Other Ambulatory Visit: Payer: Self-pay | Admitting: Family Medicine

## 2017-05-09 DIAGNOSIS — G609 Hereditary and idiopathic neuropathy, unspecified: Secondary | ICD-10-CM | POA: Diagnosis not present

## 2017-05-09 DIAGNOSIS — Z79899 Other long term (current) drug therapy: Secondary | ICD-10-CM | POA: Diagnosis not present

## 2017-05-14 ENCOUNTER — Emergency Department (HOSPITAL_COMMUNITY): Payer: Medicare Other

## 2017-05-14 ENCOUNTER — Encounter (HOSPITAL_COMMUNITY): Payer: Self-pay | Admitting: Emergency Medicine

## 2017-05-14 ENCOUNTER — Other Ambulatory Visit: Payer: Self-pay

## 2017-05-14 ENCOUNTER — Emergency Department (HOSPITAL_COMMUNITY)
Admission: EM | Admit: 2017-05-14 | Discharge: 2017-05-14 | Disposition: A | Discharge status: Home / Self Care | Payer: Medicare Other | Attending: Emergency Medicine | Admitting: Emergency Medicine

## 2017-05-14 DIAGNOSIS — S60022A Contusion of left index finger without damage to nail, initial encounter: Secondary | ICD-10-CM | POA: Diagnosis not present

## 2017-05-14 DIAGNOSIS — Y939 Activity, unspecified: Secondary | ICD-10-CM | POA: Insufficient documentation

## 2017-05-14 DIAGNOSIS — Y929 Unspecified place or not applicable: Secondary | ICD-10-CM | POA: Insufficient documentation

## 2017-05-14 DIAGNOSIS — S0181XA Laceration without foreign body of other part of head, initial encounter: Secondary | ICD-10-CM | POA: Insufficient documentation

## 2017-05-14 DIAGNOSIS — M79642 Pain in left hand: Secondary | ICD-10-CM | POA: Diagnosis not present

## 2017-05-14 DIAGNOSIS — I10 Essential (primary) hypertension: Secondary | ICD-10-CM | POA: Insufficient documentation

## 2017-05-14 DIAGNOSIS — W1839XA Other fall on same level, initial encounter: Secondary | ICD-10-CM | POA: Insufficient documentation

## 2017-05-14 DIAGNOSIS — S0083XA Contusion of other part of head, initial encounter: Secondary | ICD-10-CM | POA: Diagnosis not present

## 2017-05-14 DIAGNOSIS — Y999 Unspecified external cause status: Secondary | ICD-10-CM | POA: Diagnosis not present

## 2017-05-14 DIAGNOSIS — Z87891 Personal history of nicotine dependence: Secondary | ICD-10-CM | POA: Diagnosis not present

## 2017-05-14 DIAGNOSIS — S0990XA Unspecified injury of head, initial encounter: Secondary | ICD-10-CM | POA: Diagnosis not present

## 2017-05-14 DIAGNOSIS — Z79899 Other long term (current) drug therapy: Secondary | ICD-10-CM | POA: Diagnosis not present

## 2017-05-14 DIAGNOSIS — Y92009 Unspecified place in unspecified non-institutional (private) residence as the place of occurrence of the external cause: Secondary | ICD-10-CM

## 2017-05-14 DIAGNOSIS — S199XXA Unspecified injury of neck, initial encounter: Secondary | ICD-10-CM | POA: Diagnosis not present

## 2017-05-14 DIAGNOSIS — S6992XA Unspecified injury of left wrist, hand and finger(s), initial encounter: Secondary | ICD-10-CM | POA: Diagnosis not present

## 2017-05-14 DIAGNOSIS — S098XXA Other specified injuries of head, initial encounter: Secondary | ICD-10-CM | POA: Diagnosis not present

## 2017-05-14 DIAGNOSIS — W19XXXA Unspecified fall, initial encounter: Secondary | ICD-10-CM

## 2017-05-14 MED ORDER — TETANUS-DIPHTH-ACELL PERTUSSIS 5-2.5-18.5 LF-MCG/0.5 IM SUSP
0.5000 mL | Freq: Once | INTRAMUSCULAR | Status: AC
  Administered 2017-05-14: 0.5 mL via INTRAMUSCULAR
  Filled 2017-05-14: qty 0.5

## 2017-05-14 NOTE — ED Notes (Signed)
Pt in radiology 

## 2017-05-14 NOTE — ED Notes (Signed)
Dermabond at bedside.  

## 2017-05-14 NOTE — ED Notes (Signed)
Bed: WA11 Expected date:  Expected time:  Means of arrival:  Comments: ems 

## 2017-05-14 NOTE — Discharge Instructions (Signed)
Dermabond will fall off in about one week.

## 2017-05-14 NOTE — ED Notes (Signed)
ED Provider at bedside. 

## 2017-05-14 NOTE — ED Provider Notes (Signed)
[facility] DEPT Provider Note   CSN: [id number] Arrival date & time: 05/14/17  0548     History   Chief Complaint Chief Complaint  Patient presents with  . Fall  . Facial Injury    HPI Brittany Mercer is a 72 y.o. female.   The history is provided by the patient and a relative.  She has history of hypertension, hyperlipidemia, peripheral neuropathy, sleepwalking and comes to the ED after having a fall at home.  She does not remember how she fell.  She suffered a laceration to her forehead, and is also complaining of pain in the left second finger.  Pain is rated at 5/10.  There is also neck pain but she denies back or chest or abdomen pain.  She denies other extremity pain.  Family members are here and states that her mental status is at its baseline.  She does not know when her last tetanus immunization was.  Past Medical History:  Diagnosis Date  . Arthritis   . Headache   . Hyperlipidemia   . Hypertension   . Peripheral neuropathy   . Sleepwalking    IN PAST - NONE IN PAST 2 YRS    Patient Active Problem List   Diagnosis Date Noted  . 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 Benign paroxysmal positional vertigo 02/09/2014  . 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 Essential hypertension, benign 07/17/2012  . Hyperlipidemia with target LDL less than 100 07/17/2012  . Migraines 07/17/2012  . Hereditary and idiopathic peripheral neuropathy 07/17/2012  . Hypokalemia 07/17/2012    Past Surgical History:  Procedure Laterality Date  . ABDOMINAL HYSTERECTOMY     partial - 1 ovary removed/ 1 ovary remains  . CESAREAN SECTION     X2  . KNEE SURGERY     right x2  . SHOULDER SURGERY Left   . TONSILLECTOMY    . TOTAL KNEE ARTHROPLASTY  12/2014  . 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   Procedure: RIGHT TOTAL KNEE ARTHROPLASTY;  Surgeon: Paralee Cancel, MD;  Location: [facility] ORS;  Service: Orthopedics;  Laterality: Right;    OB History    No data available        Home Medications    Prior to Admission medications   Medication Sig Start Date End Date Taking? Authorizing Provider  cyclobenzaprine (FLEXERIL) 5 MG tablet TAKE 1 TABLET THREE TIMES DAILY AS NEEDED FOR MUSCLE SPASM 12/12/16   Claretta Fraise, MD  DULoxetine (CYMBALTA) 30 MG capsule Take 1 daily x 1 week then 1 every other day x 1week and then stop taking. 12/12/16   Claretta Fraise, MD  fenofibrate 160 MG tablet TAKE 1 TABLET DAILY FOR CHOLESTEROL AND TRIGLYCERIDE 12/12/16   Claretta Fraise, MD  loratadine (CLARITIN) 10 MG tablet Take 10 mg by mouth daily as needed for allergies.    [provider]  metoprolol succinate (TOPROL-XL) 50 MG 24 hr tablet TAKE (1) TABLET DAILY FOR HIGH BLOOD PRESSURE. 05/08/17   Claretta Fraise, MD  NUCYNTA 100 MG TABS Take 100 mg by mouth 2 (two) times daily. 04/19/16   [provider]  potassium chloride (K-DUR) 10 MEQ tablet TAKE 2 TO 3 TABLETS DAILY AS DIRECTED 12/12/16   Claretta Fraise, MD    Family History Family History  Problem Relation Age of Onset  . Hypertension Mother   . Hyperlipidemia Mother   . Stroke Mother   . Hypertension Father   . Dementia Father   . Neuropathy Father   . Hypertension  Brother     Social History Social History   Tobacco Use  . Smoking status: Former Smoker    Packs/day: 0.50    Years: 25.00    Pack years: 12.50    Types: Cigarettes    Last attempt to quit: 07/18/1998    Years since quitting: 18.8  . Smokeless tobacco: Never Used  Substance Use Topics  . Alcohol use: Yes    Alcohol/week: 0.0 oz    Comment: rarely - once a month  . Drug use: No     Allergies   Statins; Penicillins; and Sulfa antibiotics   Review of Systems Review of Systems  All other systems reviewed and are negative.    Physical Exam Updated Vital Signs BP (!) 168/88   Pulse 78   Temp 97.6 F (36.4 C) (Oral)   Resp 18   SpO2 97%   Physical Exam  Nursing note and vitals reviewed.  72 year old female,  resting comfortably and in no acute distress. Vital signs are significant for elevated systolic blood pressure. Oxygen saturation is 97%, which is normal. Head is normocephalic.  Irregular forehead laceration is present. PERRLA, EOMI. Oropharynx is clear. Neck is nontender without adenopathy or JVD. Back is nontender and there is no CVA tenderness. Lungs are clear without rales, wheezes, or rhonchi. Chest is nontender. Heart has regular rate and rhythm without murmur. Abdomen is soft, flat, nontender without masses or hepatosplenomegaly and peristalsis is normoactive. Extremities have trace edema, full range of motion is present.  There is slight soft tissue swelling of the PIP joint of the left second finger with mild to moderate tenderness. Skin is warm and dry without rash. Neurologic: Mental status is normal, cranial nerves are intact, there are no motor or sensory deficits.  ED Treatments / Results   Radiology No results found.  Procedures .Marland KitchenLaceration Repair Date/Time: 05/14/2017 7:50 AM Performed by: Delora Fuel, MD Authorized by: Delora Fuel, MD   Consent:    Consent obtained:  Verbal   Consent given by:  Patient   Risks discussed:  Infection, pain and poor cosmetic result   Alternatives discussed: alternative treatment. Anesthesia (see MAR for exact dosages):    Anesthesia method:  None Laceration details:    Location:  Face   Face location:  Forehead   Length (cm):  4   Depth (mm):  2 Repair type:    Repair type:  Simple Pre-procedure details:    Preparation:  Patient was prepped and draped in usual sterile fashion and imaging obtained to evaluate for foreign bodies Exploration:    Hemostasis achieved with:  Direct pressure   Wound exploration: entire depth of wound probed and visualized     Wound extent: no foreign bodies/material noted and no underlying fracture noted     Contaminated: no   Treatment:    Area cleansed with:  Shur-Clens   Amount of cleaning:   Standard Skin repair:    Repair method: Tissue adhesive. Approximation:    Approximation:  Close Post-procedure details:    Dressing:  Open (no dressing)   Patient tolerance of procedure:  Tolerated well, no immediate complications    Medications Ordered in ED Medications  Tdap (BOOSTRIX) injection 0.5 mL (not administered)     Initial Impression / Assessment and Plan / ED Course  I have reviewed the triage vital signs and the nursing notes.  Pertinent labs & imaging results that were available during my care of the patient were reviewed by me and considered  in my medical decision making (see chart for details).  Fall with forehead laceration.  She is being sent for CT of head and cervical spine, plain x-rays of left hand.  Old records are reviewed, and she does have a prior ED visit for fall.  There is no record of tetanus immunization. Tdap booster is given.  CT and x-rays are unremarkable.  Laceration is closed with tissue adhesive and she is discharged home.  Discussion was held about the advisability of her having a life alert pendant.  Final Clinical Impressions(s) / ED Diagnoses   Final diagnoses:  Fall at home, initial encounter  Forehead laceration, initial encounter  Contusion of left index finger without damage to nail, initial encounter    ED Discharge Orders    None      Delora Fuel, MD 39/03/00 [phone]

## 2017-05-14 NOTE — ED Triage Notes (Signed)
Pt brought in by EMS from home with c/o forehead laceration after her unwitnessed fall this morning.  Pt was found by her son lying on the kitchen floor.  Pt unable to state how or why she fell---- per family, pt has hx of "sleepwalking".  Pt sustained superficial laceration on her right forehead, bleeding controlled.  Pt was awake on EMS' arrival on scene.  Pt also c/o pain to left 2nd finger.

## 2017-05-30 ENCOUNTER — Other Ambulatory Visit: Payer: Self-pay | Admitting: Family Medicine

## 2017-06-06 DIAGNOSIS — M25569 Pain in unspecified knee: Secondary | ICD-10-CM | POA: Diagnosis not present

## 2017-06-06 DIAGNOSIS — I10 Essential (primary) hypertension: Secondary | ICD-10-CM | POA: Diagnosis not present

## 2017-06-06 DIAGNOSIS — G8929 Other chronic pain: Secondary | ICD-10-CM | POA: Diagnosis not present

## 2017-06-06 DIAGNOSIS — Z79899 Other long term (current) drug therapy: Secondary | ICD-10-CM | POA: Diagnosis not present

## 2017-06-12 ENCOUNTER — Ambulatory Visit: Payer: Medicare Other | Admitting: Family Medicine

## 2017-06-22 ENCOUNTER — Encounter: Payer: Self-pay | Admitting: Family Medicine

## 2017-06-22 ENCOUNTER — Ambulatory Visit (INDEPENDENT_AMBULATORY_CARE_PROVIDER_SITE_OTHER): Payer: Medicare Other | Admitting: Family Medicine

## 2017-06-22 VITALS — BP 118/76 | HR 66 | Temp 98.0°F | Ht 69.0 in | Wt 188.5 lb

## 2017-06-22 DIAGNOSIS — E785 Hyperlipidemia, unspecified: Secondary | ICD-10-CM

## 2017-06-22 DIAGNOSIS — G8929 Other chronic pain: Secondary | ICD-10-CM

## 2017-06-22 DIAGNOSIS — H259 Unspecified age-related cataract: Secondary | ICD-10-CM | POA: Diagnosis not present

## 2017-06-22 DIAGNOSIS — I10 Essential (primary) hypertension: Secondary | ICD-10-CM | POA: Diagnosis not present

## 2017-06-22 LAB — URINALYSIS
Bilirubin, UA: NEGATIVE
Glucose, UA: NEGATIVE
Ketones, UA: NEGATIVE
Nitrite, UA: NEGATIVE
Protein, UA: NEGATIVE
RBC, UA: NEGATIVE
Specific Gravity, UA: 1.015 (ref 1.005–1.030)
Urobilinogen, Ur: 0.2 mg/dL (ref 0.2–1.0)
pH, UA: 7.5 (ref 5.0–7.5)

## 2017-06-22 MED ORDER — POTASSIUM CHLORIDE ER 10 MEQ PO TBCR: EXTENDED_RELEASE_TABLET | ORAL | 0 refills | Status: DC

## 2017-06-22 MED ORDER — METOPROLOL SUCCINATE ER 50 MG PO TB24
ORAL_TABLET | ORAL | 1 refills | Status: DC
Start: 2017-06-22 — End: 2017-10-31

## 2017-06-22 MED ORDER — FENOFIBRATE 160 MG PO TABS: ORAL_TABLET | ORAL | 1 refills | Status: DC

## 2017-06-22 MED ORDER — CYCLOBENZAPRINE HCL 5 MG PO TABS: ORAL_TABLET | ORAL | 5 refills | Status: DC

## 2017-06-22 NOTE — Progress Notes (Signed)
Subjective:  Patient ID: Brittany Mercer, female    DOB: 03/24/1946  Age: 72 y.o. MRN: [id number]  CC: Hypertension (pt here today for routine follow up of her chronic medical conditions and also wants to discuss the Nucynta)   HPI Brittany Mercer presents for  follow-up of hypertension. Patient has no history of headache chest pain or shortness of breath or recent cough. Patient also denies symptoms of TIA such as focal numbness or weakness. Patient denies side effects from medication. States taking it regularly. Pt. Describes fainting spell occurring on February 20. She had been very stressed. She wasn't sleeping well. No recurrence.  Patient is upset that she went to her pain doctor and they got a positive for believe it was meth and cocaine and the doctor was very abrupt with her about that even though she denied it.  She states that when she arrived she was given a cup for urine drug screen with someone else's name on it and when she brought it to the nurses attention they switched the cups but she wonders if that could still be the reason for the mistake with the result.  Patient is adamant that she has not used any drugs.  She wants to prove that the result was wrong. Pain described as numbness, stabbing, 10/10 every night starting around 7 PM. She has tried Lyrica in the past It caused excessive swelling.  She has also been told she has some cataracts.  She would  like to see an ophthalmologist. History Brittany Mercer has a past medical history of Arthritis, Headache, Hyperlipidemia, Hypertension, Peripheral neuropathy, and Sleepwalking.   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   Her family history includes Dementia in her father; Hyperlipidemia in her mother; Hypertension in her brother, father, and mother;  Neuropathy in her father; Stroke in her mother.She reports that she quit smoking about 18 years ago. Her smoking use included cigarettes. She has a 12.50 pack-year smoking history. She has never used smokeless tobacco. She reports that she drinks alcohol. She reports that she does not use drugs.  Current Outpatient Medications on File Prior to Visit  Medication Sig Dispense Refill  . loratadine (CLARITIN) 10 MG tablet Take 10 mg by mouth daily as needed for allergies.    . NUCYNTA 100 MG TABS Take 100 mg by mouth 2 (two) times daily.     Current Facility-Administered Medications on File Prior to Visit  Medication Dose Route Frequency Provider Last Rate Last Dose  . cyanocobalamin ((VITAMIN B-12)) injection 1,000 mcg  1,000 mcg Intramuscular Q30 days Claretta Fraise, MD   1,000 mcg at 04/24/16 1243    ROS Review of Systems  Constitutional: Negative.   HENT: Negative for congestion.   Eyes: Negative for visual disturbance.  Respiratory: Negative for shortness of breath.   Cardiovascular: Negative for chest pain.  Gastrointestinal: Negative for abdominal pain, constipation, diarrhea, nausea and vomiting.  Genitourinary: Negative for difficulty urinating.  Musculoskeletal: Positive for arthralgias and myalgias.  Neurological: Negative for headaches.  Psychiatric/Behavioral: Negative for sleep disturbance.    Objective:  BP 118/76   Pulse 66   Temp 98 F (36.7 C) (Oral)   Ht 5' 9" (1.753 m)   Wt 188 lb 8 oz (85.5 kg)   BMI 27.84 kg/m   BP Readings from Last 3 Encounters:  06/22/17 118/76  05/14/17 125/62  12/12/16 126/78  Wt Readings from Last 3 Encounters:  06/22/17 188 lb 8 oz (85.5 kg)  12/12/16 190 lb (86.2 kg)  09/20/16 191 lb (86.6 kg)     Physical Exam  Constitutional: She is oriented to person, place, and time. She appears well-developed and well-nourished. No distress.  HENT:  Head: Normocephalic and atraumatic.  Right Ear: External ear normal.  Left Ear:  External ear normal.  Neck: Normal range of motion. Neck supple.  Cardiovascular: Normal rate, regular rhythm and normal heart sounds.  No murmur heard. Pulmonary/Chest: Effort normal and breath sounds normal. No respiratory distress. She has no wheezes. She has no rales.  Abdominal: Soft. There is no tenderness.  Musculoskeletal: Normal range of motion. She exhibits no edema or tenderness.  Neurological: She is alert and oriented to person, place, and time. She exhibits normal muscle tone. Coordination normal.  Skin: Skin is warm and dry.  Psychiatric: She has a normal mood and affect. Her behavior is normal.      Assessment & Plan:   Anari was seen today for hypertension.  Diagnoses and all orders for this visit:  Hyperlipidemia with target LDL less than 100 -     CBC with Differential/Platelet -     CMP14+EGFR -     Lipid panel  Other chronic pain -     ToxASSURE Select 13 (MW), Urine -     Urinalysis  Age-related cataract of both eyes, unspecified age-related cataract type -     Ambulatory referral to Ophthalmology  Essential hypertension, benign  Other orders -     cyclobenzaprine (FLEXERIL) 5 MG tablet; TAKE 1 TABLET THREE TIMES DAILY AS NEEDED FOR MUSCLE SPASM -     fenofibrate 160 MG tablet; TAKE 1 TABLET DAILY FOR CHOLESTEROL AND TRIGLYCERIDE -     metoprolol succinate (TOPROL-XL) 50 MG 24 hr tablet; TAKE (1) TABLET DAILY FOR HIGH BLOOD PRESSURE. -     potassium chloride (K-DUR) 10 MEQ tablet; TAKE 2 TO 3 TABLETS DAILY AS DIRECTED   Allergies as of 06/22/2017      Reactions   Statins Other (See Comments)   Myalgia   Penicillins Rash   Pt not sure if she is allergic to penicillin or sulfa. So she does not take either one.  Has patient had a PCN reaction causing immediate rash, facial/tongue/throat swelling, SOB or lightheadedness with hypotension: No Has patient had a PCN reaction causing severe rash involving mucus membranes or skin necrosis: Yes rash on  arms.  Has patient had a PCN reaction that required hospitalization No Has patient had a PCN reaction occurring within the last 10 years:No If all of the above answers are "NO",   Sulfa Antibiotics Rash   Pt not sure if she is allergic to sulfa or penicillin so she doesn't take either one.       Medication List        Accurate as of 06/22/17 11:59 PM. Always use your most recent med list.          cyclobenzaprine 5 MG tablet Commonly known as:  FLEXERIL TAKE 1 TABLET THREE TIMES DAILY AS NEEDED FOR MUSCLE SPASM   fenofibrate 160 MG tablet TAKE 1 TABLET DAILY FOR CHOLESTEROL AND TRIGLYCERIDE   loratadine 10 MG tablet Commonly known as:  CLARITIN Take 10 mg by mouth daily as needed for allergies.   metoprolol succinate 50 MG 24 hr tablet Commonly known as:  TOPROL-XL TAKE (1) TABLET DAILY FOR HIGH BLOOD PRESSURE.   NUCYNTA 100   MG Tabs Generic drug:  Tapentadol HCl Take 100 mg by mouth 2 (two) times daily.   potassium chloride 10 MEQ tablet Commonly known as:  K-DUR TAKE 2 TO 3 TABLETS DAILY AS DIRECTED       Meds ordered this encounter  Medications  . cyclobenzaprine (FLEXERIL) 5 MG tablet    Sig: TAKE 1 TABLET THREE TIMES DAILY AS NEEDED FOR MUSCLE SPASM    Dispense:  30 tablet    Refill:  5  . fenofibrate 160 MG tablet    Sig: TAKE 1 TABLET DAILY FOR CHOLESTEROL AND TRIGLYCERIDE    Dispense:  90 tablet    Refill:  1  . metoprolol succinate (TOPROL-XL) 50 MG 24 hr tablet    Sig: TAKE (1) TABLET DAILY FOR HIGH BLOOD PRESSURE.    Dispense:  90 tablet    Refill:  1  . potassium chloride (K-DUR) 10 MEQ tablet    Sig: TAKE 2 TO 3 TABLETS DAILY AS DIRECTED    Dispense:  180 tablet    Refill:  0    I expressed to the patient that the drug screen was now  a part of her record.  She can contested if she would like.  She would have to contact the pain clinic for that.  Meanwhile she is a repeat drug screen today comes back negative with help mitigate the result when  combined with previous negatives in the past.   Follow-up: Return in about 3 months (around 09/22/2017).  Claretta Fraise, M.D.

## 2017-06-23 LAB — LIPID PANEL
Chol/HDL Ratio: 4.2 ratio (ref 0.0–4.4)
Cholesterol, Total: 193 mg/dL (ref 100–199)
HDL: 46 mg/dL (ref >39)
LDL Calculated: 126 mg/dL — ABNORMAL HIGH (ref 0–99)
Triglycerides: 103 mg/dL (ref 0–149)
VLDL Cholesterol Cal: 21 mg/dL (ref 5–40)

## 2017-06-23 LAB — CMP14+EGFR
ALT: 18 IU/L (ref 0–32)
AST: 21 IU/L (ref 0–40)
Albumin/Globulin Ratio: 2.3 — ABNORMAL HIGH (ref 1.2–2.2)
Albumin: 4.6 g/dL (ref 3.5–4.8)
Alkaline Phosphatase: 55 IU/L (ref 39–117)
BUN/Creatinine Ratio: 24 (ref 12–28)
BUN: 23 mg/dL (ref 8–27)
Bilirubin Total: 0.6 mg/dL (ref 0.0–1.2)
CO2: 24 mmol/L (ref 20–29)
Calcium: 9.8 mg/dL (ref 8.7–10.3)
Chloride: 103 mmol/L (ref 96–106)
Creatinine, Ser: 0.96 mg/dL (ref 0.57–1.00)
GFR calc Af Amer: 69 mL/min/{1.73_m2} (ref >59)
GFR calc non Af Amer: 60 mL/min/{1.73_m2} (ref >59)
Globulin, Total: 2 g/dL (ref 1.5–4.5)
Glucose: 93 mg/dL (ref 65–99)
Potassium: 4.2 mmol/L (ref 3.5–5.2)
Sodium: 142 mmol/L (ref 134–144)
Total Protein: 6.6 g/dL (ref 6.0–8.5)

## 2017-06-23 LAB — CBC WITH DIFFERENTIAL/PLATELET
Basophils Absolute: 0 10*3/uL (ref 0.0–0.2)
Basos: 0 %
EOS (ABSOLUTE): 0.3 10*3/uL (ref 0.0–0.4)
Eos: 5 %
Hematocrit: 42.5 % (ref 34.0–46.6)
Hemoglobin: 14.7 g/dL (ref 11.1–15.9)
Immature Grans (Abs): 0 10*3/uL (ref 0.0–0.1)
Immature Granulocytes: 0 %
Lymphocytes Absolute: 1.4 10*3/uL (ref 0.7–3.1)
Lymphs: 22 %
MCH: 31.1 pg (ref 26.6–33.0)
MCHC: 34.6 g/dL (ref 31.5–35.7)
MCV: 90 fL (ref 79–97)
Monocytes Absolute: 0.5 10*3/uL (ref 0.1–0.9)
Monocytes: 8 %
Neutrophils Absolute: 4 10*3/uL (ref 1.4–7.0)
Neutrophils: 65 %
Platelets: 174 10*3/uL (ref 150–379)
RBC: 4.72 x10E6/uL (ref 3.77–5.28)
RDW: 14 % (ref 12.3–15.4)
WBC: 6.3 10*3/uL (ref 3.4–10.8)

## 2017-06-28 LAB — TOXASSURE SELECT 13 (MW), URINE

## 2017-06-30 ENCOUNTER — Encounter: Payer: Self-pay | Admitting: Family Medicine

## 2017-07-06 ENCOUNTER — Ambulatory Visit (INDEPENDENT_AMBULATORY_CARE_PROVIDER_SITE_OTHER): Payer: Medicare Other | Admitting: Family Medicine

## 2017-07-06 ENCOUNTER — Encounter: Payer: Self-pay | Admitting: Family Medicine

## 2017-07-06 VITALS — BP 123/81 | HR 82 | Temp 97.8°F | Ht 69.0 in | Wt 187.0 lb

## 2017-07-06 DIAGNOSIS — G609 Hereditary and idiopathic neuropathy, unspecified: Secondary | ICD-10-CM

## 2017-07-06 NOTE — Progress Notes (Signed)
Chief Complaint  Patient presents with  . Follow-up    pt here today for follow up to discuss prescribing Nucynta for her neuropathy because she isn't going to go back to the pain clinic    HPI  Patient presents today for initial patient visit regarding peripheral neuropathy and opiate dependence for many years for the use of Nucynta to treat the neuropathy.  The neuropathy affects both feet and arms.  She went off of her  Nucynta 36 hours ago just to the see how we would do prior to this appointment and her pain quickly went to 10/10.  She describes it as her feet became so hot they were burning but not a burning like a sunburn or inflamed just in burning from the inside that was indescribable E severe.  She took her Nucynta and within 15 minutes the pain was significantly better.  She states that she has been under treatment for this neuropathy for 9 years.  The pain was present for 2 years before it was diagnosed as peripheral neuropathy and treatment began.  She has had nerve conduction velocity testing done on 2 occasions.  Most recently by Dr. Jannifer Franklin 5-6 years ago. It did not show demyelinization or large fiber neuropathy. Prior to that an NCV done in 2009 showed significant neuropathy. She tried Cymbalta in 2015 but it made her feel sick all the time.She has been a patient of Dr. Francesco Runner  For pain managenment, but he left the area. She tried [facility], but is leaving due to concern that her urine drug screen was mishandled.  PMH: Smoking status noted ROS: Per HPI  Objective: BP 123/81   Pulse 82   Temp 97.8 F (36.6 C) (Oral)   Ht 5\' 9"  (1.753 m)   Wt 187 lb (84.8 kg)   BMI 27.62 kg/m  Gen: NAD, alert, cooperative with exam HEENT: NCAT, EOMI, PERRL CV: RRR, good S1/S2, no murmur Resp: CTABL, no wheezes, non-labored Ext: No edema, warm Neuro: Alert and oriented, No gross deficits  Assessment and plan:  No diagnosis found.  Meds ordered this encounter  Medications  .  NUCYNTA 100 MG TABS    Sig: Take 1 tablet (100 mg total) by mouth every 6 (six) hours as needed.    Dispense:  120 tablet    Refill:  0    45 minutes was spent with the patient more than half of which was spent in discussion of the treatment plan and expectations for care etc. regarding the use of Nucynta.  The PMP aware was reviewed.  Her medical record for visits with Dr. Jannifer Franklin and Dr. Francesco Runner was reviewed as well.  Results of nerve conduction were reviewed.  Pain management agreement/contract was explained in detail and signed and witnessed.  A 1 month initial prescription for Nucynta was prescribed.  Follow up 1 month Claretta Fraise, MD

## 2017-07-08 ENCOUNTER — Encounter: Payer: Self-pay | Admitting: Family Medicine

## 2017-07-08 MED ORDER — NUCYNTA 100 MG PO TABS: 100.0000 mg | ORAL_TABLET | Freq: Four times a day (QID) | ORAL | 0 refills | Status: DC | PRN

## 2017-07-09 ENCOUNTER — Telehealth: Payer: Self-pay | Admitting: Family Medicine

## 2017-07-10 NOTE — Telephone Encounter (Signed)
lmtcb

## 2017-07-10 NOTE — Telephone Encounter (Signed)
The pharmacy must have been able to get it approved because we have not heard anything back

## 2017-07-10 NOTE — Telephone Encounter (Signed)
Patient aware that [facility] has her rx- spoke with them.

## 2017-07-10 NOTE — Telephone Encounter (Signed)
Please address   I have not gotten a prior authorization for any meds for this patient

## 2017-07-17 DIAGNOSIS — H2513 Age-related nuclear cataract, bilateral: Secondary | ICD-10-CM | POA: Diagnosis not present

## 2017-08-01 ENCOUNTER — Ambulatory Visit (INDEPENDENT_AMBULATORY_CARE_PROVIDER_SITE_OTHER): Payer: Medicare Other | Admitting: Family Medicine

## 2017-08-01 ENCOUNTER — Encounter: Payer: Self-pay | Admitting: Family Medicine

## 2017-08-01 VITALS — BP 127/79 | HR 95 | Temp 98.8°F | Ht 69.0 in | Wt 191.2 lb

## 2017-08-01 DIAGNOSIS — Z79891 Long term (current) use of opiate analgesic: Secondary | ICD-10-CM | POA: Diagnosis not present

## 2017-08-01 DIAGNOSIS — R52 Pain, unspecified: Secondary | ICD-10-CM | POA: Diagnosis not present

## 2017-08-01 MED ORDER — NUCYNTA 100 MG PO TABS: 100.0000 mg | ORAL_TABLET | Freq: Four times a day (QID) | ORAL | 0 refills | Status: DC | PRN

## 2017-08-01 MED ORDER — TAPENTADOL HCL 100 MG PO TABS: 100.0000 mg | ORAL_TABLET | Freq: Four times a day (QID) | ORAL | 0 refills | Status: DC

## 2017-08-07 ENCOUNTER — Encounter: Payer: Self-pay | Admitting: Family Medicine

## 2017-08-07 NOTE — Progress Notes (Signed)
Chief Complaint  Patient presents with  . Medication Management    pt here today for 1 month f/u for Nucynta, no concerns voiced    HPI  Patient presents today for Pain assessment: Cause of pain-  Pain location- neuropathy Pain on scale of 1-10- 6/10 Frequency- What increases pain-unknown What makes pain Better-med, laying down Effects on ADL - makes them difficult Any change in general medical condition-none  Current medications- nucynta 100 mg QID Effectiveness of current meds-good Adverse reactions form pain meds-denies Morphine equivalent unknown  Pill count performed-No Urine drug screen- Yes Was the [facility] reviewed- yes  If yes were their any concerning findings? - no  PMH: Smoking status noted ROS: Per HPI  Objective: BP 127/79   Pulse 95   Temp 98.8 F (37.1 C) (Oral)   Ht 5\' 9"  (1.753 m)   Wt 191 lb 4 oz (86.8 kg)   BMI 28.24 kg/m  Gen: NAD, alert, cooperative with exam HEENT: NCAT, EOMI, PERRL CV: RRR, good S1/S2, no murmur Resp: CTABL, no wheezes, non-labored Abd: SNTND, BS present, no guarding or organomegaly Ext: No edema, warm Neuro: Alert and oriented, No gross deficits  Assessment and plan:  1. Pain management     Meds ordered this encounter  Medications  . NUCYNTA 100 MG TABS    Sig: Take 1 tablet (100 mg total) by mouth every 6 (six) hours as needed.    Dispense:  120 tablet    Refill:  0  . Tapentadol HCl (NUCYNTA) 100 MG TABS    Sig: Take 1 tablet (100 mg total) by mouth 4 (four) times daily.    Dispense:  120 tablet    Refill:  0  . Tapentadol HCl (NUCYNTA) 100 MG TABS    Sig: Take 1 tablet (100 mg total) by mouth 4 (four) times daily.    Dispense:  120 tablet    Refill:  0    Orders Placed This Encounter  Procedures  . ToxASSURE Select 13 (MW), Urine    Follow up 3 months  Claretta Fraise, MD

## 2017-08-08 LAB — TOXASSURE SELECT 13 (MW), URINE

## 2017-10-31 ENCOUNTER — Encounter: Payer: Self-pay | Admitting: Family Medicine

## 2017-10-31 ENCOUNTER — Ambulatory Visit (INDEPENDENT_AMBULATORY_CARE_PROVIDER_SITE_OTHER): Payer: Medicare Other | Admitting: Family Medicine

## 2017-10-31 VITALS — BP 107/63 | HR 73 | Temp 98.0°F | Ht 69.0 in | Wt 190.0 lb

## 2017-10-31 DIAGNOSIS — R52 Pain, unspecified: Secondary | ICD-10-CM | POA: Diagnosis not present

## 2017-10-31 DIAGNOSIS — G609 Hereditary and idiopathic neuropathy, unspecified: Secondary | ICD-10-CM

## 2017-10-31 DIAGNOSIS — N952 Postmenopausal atrophic vaginitis: Secondary | ICD-10-CM

## 2017-10-31 DIAGNOSIS — G8929 Other chronic pain: Secondary | ICD-10-CM

## 2017-10-31 DIAGNOSIS — Z79891 Long term (current) use of opiate analgesic: Secondary | ICD-10-CM | POA: Diagnosis not present

## 2017-10-31 MED ORDER — FENOFIBRATE 160 MG PO TABS: ORAL_TABLET | ORAL | 1 refills | Status: DC

## 2017-10-31 MED ORDER — NUCYNTA 100 MG PO TABS: 100.0000 mg | ORAL_TABLET | Freq: Four times a day (QID) | ORAL | 0 refills | Status: DC | PRN

## 2017-10-31 MED ORDER — ESTRADIOL 0.1 MG/GM VA CREA: 1.0000 | TOPICAL_CREAM | VAGINAL | 12 refills | Status: DC

## 2017-10-31 MED ORDER — TAPENTADOL HCL 100 MG PO TABS: 100.0000 mg | ORAL_TABLET | Freq: Four times a day (QID) | ORAL | 0 refills | Status: DC

## 2017-10-31 MED ORDER — METOPROLOL SUCCINATE ER 50 MG PO TB24: ORAL_TABLET | ORAL | 1 refills | Status: DC

## 2017-10-31 MED ORDER — POTASSIUM CHLORIDE ER 10 MEQ PO TBCR: EXTENDED_RELEASE_TABLET | ORAL | 1 refills | Status: DC

## 2017-10-31 NOTE — Patient Instructions (Signed)
Use the Estrace Cream: ! Applicator full QHS x 1 week, then 1/2 applicator full QHS x 1 week, then 1 applicator full weekly.      Heart-Healthy Eating Plan Heart-healthy meal planning includes:  Limiting unhealthy fats.  Increasing healthy fats.  Making other small dietary changes.  You may need to talk with your doctor or a diet specialist (dietitian) to create an eating plan that is right for you. What types of fat should I choose?  Choose healthy fats. These include olive oil and canola oil, flaxseeds, walnuts, almonds, and seeds.  Eat more omega-3 fats. These include salmon, mackerel, sardines, tuna, flaxseed oil, and ground flaxseeds. Try to eat fish at least twice each week.  Limit saturated fats. ? Saturated fats are often found in animal products, such as meats, butter, and cream. ? Plant sources of saturated fats include palm oil, palm kernel oil, and coconut oil.  Avoid foods with partially hydrogenated oils in them. These include stick margarine, some tub margarines, cookies, crackers, and other baked goods. These contain trans fats. What general guidelines do I need to follow?  Check food labels carefully. Identify foods with trans fats or high amounts of saturated fat.  Fill one half of your plate with vegetables and green salads. Eat 4-5 servings of vegetables per day. A serving of vegetables is: ? 1 cup of raw leafy vegetables. ?  cup of raw or cooked cut-up vegetables. ?  cup of vegetable juice.  Fill one fourth of your plate with whole grains. Look for the word "whole" as the first word in the ingredient list.  Fill one fourth of your plate with lean protein foods.  Eat 4-5 servings of fruit per day. A serving of fruit is: ? One medium whole fruit. ?  cup of dried fruit. ?  cup of fresh, frozen, or canned fruit. ?  cup of 100% fruit juice.  Eat more foods that contain soluble fiber. These include apples, broccoli, carrots, beans, peas, and barley.  Try to get 20-30 g of fiber per day.  Eat more home-cooked food. Eat less restaurant, buffet, and fast food.  Limit or avoid alcohol.  Limit foods high in starch and sugar.  Avoid fried foods.  Avoid frying your food. Try baking, boiling, grilling, or broiling it instead. You can also reduce fat by: ? Removing the skin from poultry. ? Removing all visible fats from meats. ? Skimming the fat off of stews, soups, and gravies before serving them. ? Steaming vegetables in water or broth.  Lose weight if you are overweight.  Eat 4-5 servings of nuts, legumes, and seeds per week: ? One serving of dried beans or legumes equals  cup after being cooked. ? One serving of nuts equals 1 ounces. ? One serving of seeds equals  ounce or one tablespoon.  You may need to keep track of how much salt or sodium you eat. This is especially true if you have high blood pressure. Talk with your doctor or dietitian to get more information. What foods can I eat? Grains Breads, including [place], white, pita, wheat, raisin, rye, oatmeal, and [place]. Tortillas that are neither fried nor made with lard or trans fat. Low-fat rolls, including hotdog and hamburger buns and English muffins. Biscuits. Muffins. Waffles. Pancakes. Light popcorn. Whole-grain cereals. Flatbread. Melba toast. Pretzels. Breadsticks. Rusks. Low-fat snacks. Low-fat crackers, including oyster, saltine, matzo, graham, animal, and rye. Rice and pasta, including brown rice and pastas that are made with whole wheat. Vegetables  All vegetables. Fruits All fruits, but limit coconut. Meats and Other Protein Sources Lean, well-trimmed beef, veal, pork, and lamb. Chicken and [place] without skin. All fish and shellfish. Wild duck, rabbit, pheasant, and venison. Egg whites or low-cholesterol egg substitutes. Dried beans, peas, lentils, and tofu. Seeds and most nuts. Dairy Low-fat or nonfat cheeses, including ricotta, string, and mozzarella. Skim or  1% milk that is liquid, powdered, or evaporated. Buttermilk that is made with low-fat milk. Nonfat or low-fat yogurt. Beverages Mineral water. Diet carbonated beverages. Sweets and Desserts Sherbets and fruit ices. Honey, jam, marmalade, jelly, and syrups. Meringues and gelatins. Pure sugar candy, such as hard candy, jelly beans, gumdrops, mints, marshmallows, and small amounts of dark chocolate. [employer] Eat all sweets and desserts in moderation. Fats and Oils Nonhydrogenated (trans-free) margarines. Vegetable oils, including soybean, sesame, sunflower, olive, peanut, safflower, corn, canola, and cottonseed. Salad dressings or mayonnaise made with a vegetable oil. Limit added fats and oils that you use for cooking, baking, salads, and as spreads. Other Cocoa powder. Coffee and tea. All seasonings and condiments. The items listed above may not be a complete list of recommended foods or beverages. Contact your dietitian for more options. What foods are not recommended? Grains Breads that are made with saturated or trans fats, oils, or whole milk. Croissants. Butter rolls. Cheese breads. Sweet rolls. Donuts. Buttered popcorn. Chow mein noodles. High-fat crackers, such as cheese or butter crackers. Meats and Other Protein Sources Fatty meats, such as hotdogs, short ribs, sausage, spareribs, bacon, rib eye roast or steak, and mutton. High-fat deli meats, such as salami and bologna. Caviar. Domestic duck and goose. Organ meats, such as kidney, liver, sweetbreads, and heart. Dairy Cream, sour cream, cream cheese, and creamed cottage cheese. Whole-milk cheeses, including blue (bleu), Monterey Jack, [place], [place], American, [place], Swiss, cheddar, [place], and [place]. Whole or 2% milk that is liquid, evaporated, or condensed. Whole buttermilk. Cream sauce or high-fat cheese sauce. Yogurt that is made from whole milk. Beverages Regular sodas and juice drinks with added sugar. Sweets and  Desserts Frosting. Pudding. Cookies. Cakes other than angel food cake. Candy that has milk chocolate or white chocolate, hydrogenated fat, butter, coconut, or unknown ingredients. Buttered syrups. Full-fat ice cream or ice cream drinks. Fats and Oils Gravy that has suet, meat fat, or shortening. Cocoa butter, hydrogenated oils, palm oil, coconut oil, palm kernel oil. These can often be found in baked products, candy, fried foods, nondairy creamers, and whipped toppings. Solid fats and shortenings, including bacon fat, salt pork, lard, and butter. Nondairy cream substitutes, such as coffee creamers and sour cream substitutes. Salad dressings that are made of unknown oils, cheese, or sour cream. The items listed above may not be a complete list of foods and beverages to avoid. Contact your dietitian for more information. This information is not intended to replace advice given to you by your health care provider. Make sure you discuss any questions you have with your health care provider. Document Released: 09/12/2011 Document Revised: 08/19/2015 Document Reviewed: 09/04/2013 Elsevier Interactive Patient Education  [employer].

## 2017-10-31 NOTE — Progress Notes (Signed)
Subjective:  Patient ID: Brittany Mercer, female    DOB: [birth year]  Age: 72 y.o. MRN: [id number]  CC: Medical Management of Chronic Issues   HPI Brittany Mercer presents for follow-up on her chronic pain.  Pain has dropped to a 1/10 as long as she takes her Nucynta.  It will tend to wear off in the evenings around 7 and she will end up having an increased to a 6-7/10 then she takes another Nucynta and that lasts until sometime during the night.  She will start up again.  The pain is described as a burning it is from peripheral neuropathy.  Is been present for many years.  It has been stable.  P milligram equivalent is 160.  PMP aware was checked showing no discrepancies.  Although she has had multiple providers that has been serial with minimal to no overlap and I have been the only one since I agreed to take her on as a pain relief patient on contract.  Depression screen [facility] 2/9 10/31/2017 06/22/2017 12/12/2016  Decreased Interest 0 0 0  Down, Depressed, Hopeless 0 0 0  PHQ - 2 Score 0 0 0  Altered sleeping - - -  Tired, decreased energy - - -  Change in appetite - - -  Feeling bad or failure about yourself  - - -  Trouble concentrating - - -  Moving slowly or fidgety/restless - - -  Suicidal thoughts - - -  PHQ-9 Score - - -  Difficult doing work/chores - - -    History Brittany Mercer has a past medical history of Arthritis, Headache, Hyperlipidemia, Hypertension, Peripheral neuropathy, and Sleepwalking.   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   Her family history includes Dementia in her father; Hyperlipidemia in her mother; Hypertension in her brother, father, and mother; Neuropathy in her father; Stroke in her mother.She reports that she quit smoking about 19 years ago. Her smoking use included  cigarettes. She has a 12.50 pack-year smoking history. She has never used smokeless tobacco. She reports that she drinks alcohol. She reports that she does not use drugs.    ROS Review of Systems  Constitutional: Negative.   HENT: Negative.   Eyes: Negative for visual disturbance.  Respiratory: Negative for shortness of breath.   Cardiovascular: Negative for chest pain.  Gastrointestinal: Negative for abdominal pain.  Genitourinary: Positive for vaginal pain (Associated with dryness and intercourse).  Musculoskeletal: Negative for arthralgias.    Objective:  BP 107/63   Pulse 73   Temp 98 F (36.7 C) (Oral)   Ht 5\' 9"  (1.753 m)   Wt 190 lb (86.2 kg)   BMI 28.06 kg/m   BP Readings from Last 3 Encounters:  10/31/17 107/63  08/01/17 127/79  07/06/17 123/81    Wt Readings from Last 3 Encounters:  10/31/17 190 lb (86.2 kg)  08/01/17 191 lb 4 oz (86.8 kg)  07/06/17 187 lb (84.8 kg)     Physical Exam  Constitutional: She is oriented to person, place, and time. She appears well-developed and well-nourished. No distress.  Cardiovascular: Normal rate and regular rhythm.  Pulmonary/Chest: Breath sounds normal.  Neurological: She is alert and oriented to person, place, and time.  Skin: Skin is warm and dry.  Psychiatric: She has a normal mood and affect.      Assessment & Plan:   Brittany Mercer was seen  today for medical management of chronic issues.  Diagnoses and all orders for this visit:  Hereditary and idiopathic peripheral neuropathy  Pain management -     ToxASSURE Select 13 (MW), Urine  Atrophic vaginitis  Other chronic pain  Other orders -     Tapentadol HCl (NUCYNTA) 100 MG TABS; Take 1 tablet (100 mg total) by mouth 4 (four) times daily. -     Tapentadol HCl (NUCYNTA) 100 MG TABS; Take 1 tablet (100 mg total) by mouth 4 (four) times daily. -     NUCYNTA 100 MG TABS; Take 1 tablet (100 mg total) by mouth every 6 (six) hours as needed. -     fenofibrate 160  MG tablet; TAKE 1 TABLET DAILY FOR CHOLESTEROL AND TRIGLYCERIDE -     metoprolol succinate (TOPROL-XL) 50 MG 24 hr tablet; TAKE (1) TABLET DAILY FOR HIGH BLOOD PRESSURE. -     potassium chloride (K-DUR) 10 MEQ tablet; TAKE 2 TO 3 TABLETS DAILY AS DIRECTED -     estradiol (ESTRACE VAGINAL) 0.1 MG/GM vaginal cream; Place 1 Applicatorful vaginally once a week. At bed time       I am having Brittany Hayward "Trish" start on estradiol. I am also having her maintain her loratadine, Tapentadol HCl, Tapentadol HCl, NUCYNTA, fenofibrate, metoprolol succinate, and potassium chloride. We will continue to administer cyanocobalamin.  Allergies as of 10/31/2017      Reactions   Statins Other (See Comments)   Myalgia   Penicillins Rash   Pt not sure if she is allergic to penicillin or sulfa. So she does not take either one.  Has patient had a PCN reaction causing immediate rash, facial/tongue/throat swelling, SOB or lightheadedness with hypotension: No Has patient had a PCN reaction causing severe rash involving mucus membranes or skin necrosis: Yes rash on arms.  Has patient had a PCN reaction that required hospitalization No Has patient had a PCN reaction occurring within the last 10 years:No If all of the above answers are "NO",   Sulfa Antibiotics Rash   Pt not sure if she is allergic to sulfa or penicillin so she doesn't take either one.       Medication List        Accurate as of 10/31/17  3:28 PM. Always use your most recent med list.          estradiol 0.1 MG/GM vaginal cream Commonly known as:  ESTRACE VAGINAL Place 1 Applicatorful vaginally once a week. At bed time   fenofibrate 160 MG tablet TAKE 1 TABLET DAILY FOR CHOLESTEROL AND TRIGLYCERIDE   loratadine 10 MG tablet Commonly known as:  CLARITIN Take 10 mg by mouth daily as needed for allergies.   metoprolol succinate 50 MG 24 hr tablet Commonly known as:  TOPROL-XL TAKE (1) TABLET DAILY FOR HIGH BLOOD PRESSURE.     potassium chloride 10 MEQ tablet Commonly known as:  K-DUR TAKE 2 TO 3 TABLETS DAILY AS DIRECTED   NUCYNTA 100 MG Tabs Generic drug:  Tapentadol HCl Take 1 tablet (100 mg total) by mouth every 6 (six) hours as needed.   Tapentadol HCl 100 MG Tabs Commonly known as:  NUCYNTA Take 1 tablet (100 mg total) by mouth 4 (four) times daily. Start taking on:  11/30/2017   Tapentadol HCl 100 MG Tabs Commonly known as:  NUCYNTA Take 1 tablet (100 mg total) by mouth 4 (four) times daily. Start taking on:  12/30/2017        Follow-up: Return in  about 3 months (around 01/31/2018).  Claretta Fraise, M.D.

## 2017-11-05 DIAGNOSIS — Z85828 Personal history of other malignant neoplasm of skin: Secondary | ICD-10-CM | POA: Diagnosis not present

## 2017-11-05 DIAGNOSIS — L821 Other seborrheic keratosis: Secondary | ICD-10-CM | POA: Diagnosis not present

## 2017-11-05 DIAGNOSIS — L570 Actinic keratosis: Secondary | ICD-10-CM | POA: Diagnosis not present

## 2017-11-06 LAB — TOXASSURE SELECT 13 (MW), URINE

## 2017-11-08 DIAGNOSIS — Z23 Encounter for immunization: Secondary | ICD-10-CM | POA: Diagnosis not present

## 2017-11-22 DIAGNOSIS — M1712 Unilateral primary osteoarthritis, left knee: Secondary | ICD-10-CM | POA: Diagnosis not present

## 2017-11-22 DIAGNOSIS — M1711 Unilateral primary osteoarthritis, right knee: Secondary | ICD-10-CM | POA: Diagnosis not present

## 2017-11-22 DIAGNOSIS — M25562 Pain in left knee: Secondary | ICD-10-CM | POA: Diagnosis not present

## 2017-12-07 DIAGNOSIS — M25562 Pain in left knee: Secondary | ICD-10-CM | POA: Diagnosis not present

## 2017-12-21 DIAGNOSIS — M25562 Pain in left knee: Secondary | ICD-10-CM | POA: Diagnosis not present

## 2017-12-21 DIAGNOSIS — M1712 Unilateral primary osteoarthritis, left knee: Secondary | ICD-10-CM | POA: Diagnosis not present

## 2017-12-27 ENCOUNTER — Telehealth: Payer: Self-pay | Admitting: Family Medicine

## 2017-12-27 NOTE — Telephone Encounter (Signed)
Please advise on early refill

## 2017-12-27 NOTE — Telephone Encounter (Signed)
Left patient message, can not do an early refill.

## 2017-12-27 NOTE — Telephone Encounter (Signed)
Has to be done at the time of an office visit only. By PCP only. WS

## 2018-01-21 NOTE — H&P (Signed)
TOTAL KNEE ADMISSION H&P  Patient is being admitted for left total knee arthroplasty.  Subjective:  Chief Complaint:  Left knee primary OA / pain  HPI: Brittany Mercer, 72 y.o. female, has a history of pain and functional disability in the left knee due to arthritis and has failed non-surgical conservative treatments for greater than 12 weeks to include NSAID's and/or analgesics, corticosteriod injections and activity modification.  Onset of symptoms was gradual, starting 1+ years ago with gradually worsening course since that time. The patient noted prior procedures on the knee to include  arthroplasty on the right knee on 01/24/2018.  Patient currently rates pain in the left knee(s) at 8 out of 10 with activity. Patient has night pain, worsening of pain with activity and weight bearing, pain that interferes with activities of daily living, pain with passive range of motion, crepitus and joint swelling.  Patient has evidence of periarticular osteophytes and joint space narrowing by imaging studies.  There is no active infection.  Risks, benefits and expectations were discussed with the patient.  Risks including but not limited to the risk of anesthesia, blood clots, nerve damage, blood vessel damage, failure of the prosthesis, infection and up to and including death.  Patient understand the risks, benefits and expectations and wishes to proceed with surgery.   PCP: Claretta Fraise, MD  D/C Plans:       Home  Post-op Meds:       No Rx given   Tranexamic Acid:      To be given - IV   Decadron:      Is to be given  FYI:      ASA  Continue Nucynta  Add Oxy  DME:   Pt already has equipment   PT:   Virtual PT   Patient Active Problem List   Diagnosis Date Noted  . 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 Benign paroxysmal positional vertigo 02/09/2014  . 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 Essential hypertension, benign 07/17/2012  . Hyperlipidemia with target LDL less than 100 07/17/2012  . Migraines  07/17/2012  . Hereditary and idiopathic peripheral neuropathy 07/17/2012  . Hypokalemia 07/17/2012   Past Medical History:  Diagnosis Date  . Arthritis   . Headache   . Hyperlipidemia   . Hypertension   . Peripheral neuropathy   . Sleepwalking    IN PAST - NONE IN PAST 2 YRS    Past Surgical History:  Procedure Laterality Date  . ABDOMINAL HYSTERECTOMY     partial - 1 ovary removed/ 1 ovary remains  . CESAREAN SECTION     X2  . KNEE SURGERY     right x2  . SHOULDER SURGERY Left   . TONSILLECTOMY    . TOTAL KNEE ARTHROPLASTY  12/2014  . 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   Procedure: RIGHT TOTAL KNEE ARTHROPLASTY;  Surgeon: Paralee Cancel, MD;  Location: [facility];  Service: Orthopedics;  Laterality: Right;    Current Facility-Administered Medications  Medication Dose Route Frequency Provider Last Rate Last Dose  . cyanocobalamin ((VITAMIN B-12)) injection 1,000 mcg  1,000 mcg Intramuscular Q30 days Claretta Fraise, MD   1,000 mcg at 04/24/16 1243   Current Outpatient Medications  Medication Sig Dispense Refill Last Dose  . estradiol (ESTRACE VAGINAL) 0.1 MG/GM vaginal cream Place 1 Applicatorful vaginally once a week. At bed time 42.5 g 12   . fenofibrate 160 MG tablet TAKE 1 TABLET DAILY FOR CHOLESTEROL AND TRIGLYCERIDE 90 tablet 1   . loratadine (CLARITIN) 10  MG tablet Take 10 mg by mouth daily as needed for allergies.   Taking  . metoprolol succinate (TOPROL-XL) 50 MG 24 hr tablet TAKE (1) TABLET DAILY FOR HIGH BLOOD PRESSURE. 90 tablet 1   . NUCYNTA 100 MG TABS Take 1 tablet (100 mg total) by mouth every 6 (six) hours as needed. 120 tablet 0   . potassium chloride (K-DUR) 10 MEQ tablet TAKE 2 TO 3 TABLETS DAILY AS DIRECTED 180 tablet 1   . Tapentadol HCl (NUCYNTA) 100 MG TABS Take 1 tablet (100 mg total) by mouth 4 (four) times daily. 120 tablet 0   . Tapentadol HCl (NUCYNTA) 100 MG TABS Take 1 tablet (100 mg total) by mouth 4 (four) times daily. 120 tablet 0     Allergies  Allergen Reactions  . Statins Other (See Comments)    Myalgia  . Penicillins Rash    Pt not sure if she is allergic to penicillin or sulfa. So she does not take either one.  Has patient had a PCN reaction causing immediate rash, facial/tongue/throat swelling, SOB or lightheadedness with hypotension: No Has patient had a PCN reaction causing severe rash involving mucus membranes or skin necrosis: Yes rash on arms.  Has patient had a PCN reaction that required hospitalization No Has patient had a PCN reaction occurring within the last 10 years:No If all of the above answers are "NO",  . Sulfa Antibiotics Rash    Pt not sure if she is allergic to sulfa or penicillin so she doesn't take either one.     Social History   Tobacco Use  . Smoking status: Former Smoker    Packs/day: 0.50    Years: 25.00    Pack years: 12.50    Types: Cigarettes    Last attempt to quit: 07/18/1998    Years since quitting: 19.5  . Smokeless tobacco: Never Used  Substance Use Topics  . Alcohol use: Yes    Alcohol/week: 0.0 standard drinks    Comment: rarely - once a month    Family History  Problem Relation Age of Onset  . Hypertension Mother   . Hyperlipidemia Mother   . Stroke Mother   . Hypertension Father   . Dementia Father   . Neuropathy Father   . Hypertension Brother      Review of Systems  Constitutional: Negative.   HENT: Negative.   Eyes: Negative.   Respiratory: Negative.   Cardiovascular: Negative.   Gastrointestinal: Negative.   Genitourinary: Negative.   Musculoskeletal: Positive for joint pain.  Skin: Negative.   Neurological: Positive for headaches.  Endo/Heme/Allergies: Negative.   Psychiatric/Behavioral: Negative.     Objective:  Physical Exam  Constitutional: She is oriented to person, place, and time. She appears well-developed.  HENT:  Head: Normocephalic.  Eyes: Pupils are equal, round, and reactive to light.  Neck: Neck supple. No JVD present. No  tracheal deviation present. No thyromegaly present.  Cardiovascular: Normal rate, regular rhythm and intact distal pulses.  Respiratory: Effort normal and breath sounds normal. No respiratory distress. She has no wheezes.  GI: Soft. There is no tenderness. There is no guarding.  Musculoskeletal:       Left knee: She exhibits decreased range of motion, swelling and bony tenderness. She exhibits no ecchymosis, no deformity, no laceration and no erythema. Tenderness found.  Lymphadenopathy:    She has no cervical adenopathy.  Neurological: She is alert and oriented to person, place, and time. A sensory deficit (numbness/neuropathy in bilateral UEs and  LEs) is present.  Skin: Skin is warm and dry.  Psychiatric: She has a normal mood and affect.     Labs:  Estimated body mass index is 28.06 kg/m as calculated from the following:   Height as of 10/31/17: 5\' 9"  (1.753 m).   Weight as of 10/31/17: 86.2 kg.   Imaging Review Plain radiographs demonstrate severe degenerative joint disease of the left knee(s). The bone quality appears to be good for age and reported activity level.   Preoperative templating of the joint replacement has been completed, documented, and submitted to the Operating Room personnel in order to optimize intra-operative equipment management.   Anticipated LOS equal to or greater than 2 midnights due to - Age 49 and older with one or more of the following:  - Obesity  - Expected need for hospital services (PT, OT, Nursing) required for safe  discharge  - Active co-morbidities: Chronic pain requiring opiods    Assessment/Plan:  End stage arthritis, left knee   The patient history, physical examination, clinical judgment of the provider and imaging studies are consistent with end stage degenerative joint disease of the left knee(s) and total knee arthroplasty is deemed medically necessary. The treatment options including medical management, injection therapy arthroscopy  and arthroplasty were discussed at length. The risks and benefits of total knee arthroplasty were presented and reviewed. The risks due to aseptic loosening, infection, stiffness, patella tracking problems, thromboembolic complications and other imponderables were discussed. The patient acknowledged the explanation, agreed to proceed with the plan and consent was signed. Patient is being admitted for inpatient treatment for surgery, pain control, PT, OT, prophylactic antibiotics, VTE prophylaxis, progressive ambulation and ADL's and discharge planning. The patient is planning to be discharged home.     [place] Bryanne Riquelme   PA-C  01/21/2018, 8:29 AM

## 2018-01-28 ENCOUNTER — Encounter (HOSPITAL_COMMUNITY)
Admission: RE | Admit: 2018-01-28 | Discharge: 2018-01-28 | Disposition: A | Discharge status: Home / Self Care | Payer: Medicare Other | Source: Ambulatory Visit | Attending: Orthopedic Surgery | Admitting: Orthopedic Surgery

## 2018-01-28 ENCOUNTER — Encounter (HOSPITAL_COMMUNITY): Payer: Self-pay

## 2018-01-28 ENCOUNTER — Other Ambulatory Visit: Payer: Self-pay

## 2018-01-28 DIAGNOSIS — Z01818 Encounter for other preprocedural examination: Secondary | ICD-10-CM | POA: Diagnosis not present

## 2018-01-28 DIAGNOSIS — I517 Cardiomegaly: Secondary | ICD-10-CM | POA: Diagnosis not present

## 2018-01-28 DIAGNOSIS — M1712 Unilateral primary osteoarthritis, left knee: Secondary | ICD-10-CM | POA: Diagnosis not present

## 2018-01-28 HISTORY — DX: Unspecified osteoarthritis, unspecified site: M19.90

## 2018-01-28 HISTORY — DX: Opioid use, unspecified, uncomplicated: F11.90

## 2018-01-28 HISTORY — DX: Presence of spectacles and contact lenses: Z97.3

## 2018-01-28 HISTORY — DX: Hypokalemia: E87.6

## 2018-01-28 HISTORY — DX: Hereditary and idiopathic neuropathy, unspecified: G60.9

## 2018-01-28 LAB — BASIC METABOLIC PANEL
Anion gap: 5 (ref 5–15)
BUN: 28 mg/dL — ABNORMAL HIGH (ref 8–23)
CO2: 28 mmol/L (ref 22–32)
Calcium: 9.3 mg/dL (ref 8.9–10.3)
Chloride: 111 mmol/L (ref 98–111)
Creatinine, Ser: 1 mg/dL (ref 0.44–1.00)
GFR calc Af Amer: >60 mL/min (ref >60)
GFR calc non Af Amer: 55 mL/min — ABNORMAL LOW (ref >60)
Glucose, Bld: 98 mg/dL (ref 70–99)
Potassium: 4.6 mmol/L (ref 3.5–5.1)
Sodium: 144 mmol/L (ref 135–145)

## 2018-01-28 LAB — SURGICAL PCR SCREEN
MRSA, PCR: NEGATIVE
Staphylococcus aureus: NEGATIVE

## 2018-01-28 LAB — CBC
HCT: 40.2 % (ref 36.0–46.0)
Hemoglobin: 13.4 g/dL (ref 12.0–15.0)
MCH: 31.5 pg (ref 26.0–34.0)
MCHC: 33.3 g/dL (ref 30.0–36.0)
MCV: 94.6 fL (ref 80.0–100.0)
Platelets: 197 10*3/uL (ref 150–400)
RBC: 4.25 MIL/uL (ref 3.87–5.11)
RDW: 12.7 % (ref 11.5–15.5)
WBC: 7.1 10*3/uL (ref 4.0–10.5)
nRBC: 0 % (ref 0.0–0.2)

## 2018-01-28 NOTE — Progress Notes (Signed)
BMP result dated 01-28-2018 routed to dr Alvan Dame in epic.

## 2018-01-28 NOTE — Patient Instructions (Addendum)
Brittany Mercer  01-12-1946     Your procedure is scheduled on:   02-05-2018    Report to [facility] Main  Entrance,   Report to admitting at  7:30 AM      Call this number if you have problems the morning of surgery [phone]      Remember: Do not eat food or drink liquids :After Midnight.                                      BRUSH YOUR TEETH MORNING OF SURGERY AND RINSE YOUR MOUTH OUT, NO CHEWING GUM CANDY OR MINTS.       Take these medicines the morning of surgery with A SIP OF WATER:  Nucynta                                  You may not have any metal on your body including hair pins and piercings              Do not wear jewelry, make-up, lotions, powders or perfumes, deodorant              Do not wear nail polish.  Do not shave  48 hours prior to surgery.       Do not bring valuables to the hospital. [place].  Contacts, dentures or bridgework may not be worn into surgery.  Leave suitcase in the car. After surgery it may be brought to your room.     _____________________________________________________________________             [facility] - Preparing for Surgery Before surgery, you can play an important role.  Because skin is not sterile, your skin needs to be as free of germs as possible.  You can reduce the number of germs on your skin by washing with CHG (chlorahexidine gluconate) soap before surgery.  CHG is an antiseptic cleaner which kills germs and bonds with the skin to continue killing germs even after washing. Please DO NOT use if you have an allergy to CHG or antibacterial soaps.  If your skin becomes reddened/irritated stop using the CHG and inform your nurse when you arrive at Short Stay. Do not shave (including legs and underarms) for at least 48 hours prior to the first CHG shower.  You may shave your face/neck. Please follow these instructions carefully:  1.   Shower with CHG Soap the night before surgery and the  morning of Surgery.  2.  If you choose to wash your hair, wash your hair first as usual with your  normal  shampoo.  3.  After you shampoo, rinse your hair and body thoroughly to remove the  shampoo.                        \    4.  Use CHG as you would any other liquid soap.  You can apply chg directly  to the skin and wash                       Gently with a scrungie or  clean washcloth.  5.  Apply the CHG Soap to your body ONLY FROM THE NECK DOWN.   Do not use on face/ open                           Wound or open sores. Avoid contact with eyes, ears mouth and genitals (private parts).                       Wash face,  Genitals (private parts) with your normal soap.             6.  Wash thoroughly, paying special attention to the area where your surgery  will be performed.  7.  Thoroughly rinse your body with warm water from the neck down.  8.  DO NOT shower/wash with your normal soap after using and rinsing off  the CHG Soap.             9.  Pat yourself dry with a clean towel.            10.  Wear clean pajamas.            11.  Place clean sheets on your bed the night of your first shower and do not  sleep with pets. Day of Surgery : Do not apply any lotions/deodorants the morning of surgery.  Please wear clean clothes to the hospital/surgery center.  FAILURE TO FOLLOW THESE INSTRUCTIONS MAY RESULT IN THE CANCELLATION OF YOUR SURGERY PATIENT SIGNATURE_________________________________  NURSE SIGNATURE__________________________________  ________________________________________________________________________   Adam Phenix  An incentive spirometer is a tool that can help keep your lungs clear and active. This tool measures how well you are filling your lungs with each breath. Taking long deep breaths may help reverse or decrease the chance of developing breathing (pulmonary) problems (especially infection) following:  A long  period of time when you are unable to move or be active. BEFORE THE PROCEDURE   If the spirometer includes an indicator to show your best effort, your nurse or respiratory therapist will set it to a desired goal.  If possible, sit up straight or lean slightly forward. Try not to slouch.  Hold the incentive spirometer in an upright position. INSTRUCTIONS FOR USE  1. Sit on the edge of your bed if possible, or sit up as far as you can in bed or on a chair. 2. Hold the incentive spirometer in an upright position. 3. Breathe out normally. 4. Place the mouthpiece in your mouth and seal your lips tightly around it. 5. Breathe in slowly and as deeply as possible, raising the piston or the ball toward the top of the column. 6. Hold your breath for 3-5 seconds or for as long as possible. Allow the piston or ball to fall to the bottom of the column. 7. Remove the mouthpiece from your mouth and breathe out normally. 8. Rest for a few seconds and repeat Steps 1 through 7 at least 10 times every 1-2 hours when you are awake. Take your time and take a few normal breaths between deep breaths. 9. The spirometer may include an indicator to show your best effort. Use the indicator as a goal to work toward during each repetition. 10. After each set of 10 deep breaths, practice coughing to be sure your lungs are clear. If you have an incision (the cut made at the time of surgery), support your incision when coughing by placing a  pillow or rolled up towels firmly against it. Once you are able to get out of bed, walk around indoors and cough well. You may stop using the incentive spirometer when instructed by your caregiver.  RISKS AND COMPLICATIONS  Take your time so you do not get dizzy or light-headed.  If you are in pain, you may need to take or ask for pain medication before doing incentive spirometry. It is harder to take a deep breath if you are having pain. AFTER USE  Rest and breathe slowly and  easily.  It can be helpful to keep track of a log of your progress. Your caregiver can provide you with a simple table to help with this. If you are using the spirometer at home, follow these instructions: [place] IF:   You are having difficultly using the spirometer.  You have trouble using the spirometer as often as instructed.  Your pain medication is not giving enough relief while using the spirometer.  You develop fever of 100.5 F (38.1 C) or higher. SEEK IMMEDIATE MEDICAL CARE IF:   You cough up bloody sputum that had not been present before.  You develop fever of 102 F (38.9 C) or greater.  You develop worsening pain at or near the incision site. MAKE SURE YOU:   Understand these instructions.  Will watch your condition.  Will get help right away if you are not doing well or get worse. Document Released: 07/24/2006 Document Revised: 06/05/2011 Document Reviewed: 09/24/2006 [employer] Patient Information 2014 [employer], [place].   ________________________________________________________________________  WHAT IS A BLOOD TRANSFUSION? Blood Transfusion Information  A transfusion is the replacement of blood or some of its parts. Blood is made up of multiple cells which provide different functions.  Red blood cells carry oxygen and are used for blood loss replacement.  White blood cells fight against infection.  Platelets control bleeding.  Plasma helps clot blood.  Other blood products are available for specialized needs, such as hemophilia or other clotting disorders. BEFORE THE TRANSFUSION  Who gives blood for transfusions?   Healthy volunteers who are fully evaluated to make sure their blood is safe. This is blood bank blood. Transfusion therapy is the safest it has ever been in the practice of medicine. Before blood is taken from a donor, a complete history is taken to make sure that person has no history of diseases nor engages in risky social  behavior (examples are intravenous drug use or sexual activity with multiple partners). The donor's travel history is screened to minimize risk of transmitting infections, such as malaria. The donated blood is tested for signs of infectious diseases, such as HIV and hepatitis. The blood is then tested to be sure it is compatible with you in order to minimize the chance of a transfusion reaction. If you or a relative donates blood, this is often done in anticipation of surgery and is not appropriate for emergency situations. It takes many days to process the donated blood. RISKS AND COMPLICATIONS Although transfusion therapy is very safe and saves many lives, the main dangers of transfusion include:   Getting an infectious disease.  Developing a transfusion reaction. This is an allergic reaction to something in the blood you were given. Every precaution is taken to prevent this. The decision to have a blood transfusion has been considered carefully by your caregiver before blood is given. Blood is not given unless the benefits outweigh the risks. AFTER THE TRANSFUSION  Right after receiving a blood transfusion, you  will usually feel much better and more energetic. This is especially true if your red blood cells have gotten low (anemic). The transfusion raises the level of the red blood cells which carry oxygen, and this usually causes an energy increase.  The nurse administering the transfusion will monitor you carefully for complications. HOME CARE INSTRUCTIONS  No special instructions are needed after a transfusion. You may find your energy is better. Speak with your caregiver about any limitations on activity for underlying diseases you may have. SEEK MEDICAL CARE IF:   Your condition is not improving after your transfusion.  You develop redness or irritation at the intravenous (IV) site. SEEK IMMEDIATE MEDICAL CARE IF:  Any of the following symptoms occur over the next 12 hours:  Shaking  chills.  You have a temperature by mouth above 102 F (38.9 C), not controlled by medicine.  Chest, back, or muscle pain.  People around you feel you are not acting correctly or are confused.  Shortness of breath or difficulty breathing.  Dizziness and fainting.  You get a rash or develop hives.  You have a decrease in urine output.  Your urine turns a dark color or changes to pink, red, or brown. Any of the following symptoms occur over the next 10 days:  You have a temperature by mouth above 102 F (38.9 C), not controlled by medicine.  Shortness of breath.  Weakness after normal activity.  The white part of the eye turns yellow (jaundice).  You have a decrease in the amount of urine or are urinating less often.  Your urine turns a dark color or changes to pink, red, or brown. Document Released: 03/10/2000 Document Revised: 06/05/2011 Document Reviewed: 10/28/2007 [facility] Patient Information 2014 [place], [place].  _______________________________________________________________________

## 2018-01-30 ENCOUNTER — Encounter: Payer: Self-pay | Admitting: Family Medicine

## 2018-01-30 ENCOUNTER — Ambulatory Visit (INDEPENDENT_AMBULATORY_CARE_PROVIDER_SITE_OTHER): Payer: Medicare Other | Admitting: Family Medicine

## 2018-01-30 VITALS — BP 132/81 | HR 79 | Temp 97.8°F | Ht 69.0 in | Wt 189.2 lb

## 2018-01-30 DIAGNOSIS — I10 Essential (primary) hypertension: Secondary | ICD-10-CM | POA: Diagnosis not present

## 2018-01-30 DIAGNOSIS — Z79891 Long term (current) use of opiate analgesic: Secondary | ICD-10-CM | POA: Diagnosis not present

## 2018-01-30 DIAGNOSIS — R52 Pain, unspecified: Secondary | ICD-10-CM | POA: Diagnosis not present

## 2018-01-30 DIAGNOSIS — E785 Hyperlipidemia, unspecified: Secondary | ICD-10-CM | POA: Diagnosis not present

## 2018-01-30 DIAGNOSIS — M1712 Unilateral primary osteoarthritis, left knee: Secondary | ICD-10-CM

## 2018-01-30 LAB — URINALYSIS
Bilirubin, UA: NEGATIVE
Glucose, UA: NEGATIVE
Ketones, UA: NEGATIVE
Nitrite, UA: NEGATIVE
Protein, UA: NEGATIVE
RBC, UA: NEGATIVE
Specific Gravity, UA: 1.02 (ref 1.005–1.030)
Urobilinogen, Ur: 0.2 mg/dL (ref 0.2–1.0)
pH, UA: 6.5 (ref 5.0–7.5)

## 2018-01-30 LAB — BAYER DCA HB A1C WAIVED: HB A1C (BAYER DCA - WAIVED): 4.9 % (ref <7.0)

## 2018-01-30 MED ORDER — TAPENTADOL HCL 100 MG PO TABS: 100.0000 mg | ORAL_TABLET | Freq: Four times a day (QID) | ORAL | 0 refills | Status: DC

## 2018-01-30 MED ORDER — SODIUM CHLORIDE (HYPERTONIC) 5 % OP SOLN: 1.0000 [drp] | Freq: Every day | OPHTHALMIC | 1 refills | Status: DC

## 2018-01-30 MED ORDER — POTASSIUM CHLORIDE ER 10 MEQ PO TBCR: EXTENDED_RELEASE_TABLET | ORAL | 1 refills | Status: DC

## 2018-01-30 MED ORDER — METOPROLOL SUCCINATE ER 50 MG PO TB24: ORAL_TABLET | ORAL | 1 refills | Status: DC

## 2018-01-30 MED ORDER — FENOFIBRATE 160 MG PO TABS: ORAL_TABLET | ORAL | 1 refills | Status: DC

## 2018-01-30 NOTE — Progress Notes (Signed)
Subjective:  Patient ID: Brittany Mercer, female    DOB: 15-Oct-1945  Age: 72 y.o. MRN: [id number]  CC: pain medication   HPI Brittany Mercer presents for follow up on her chronic pain.MME+160. PMP aware shows no discrepancy.  No change in quality of pain. Fair relief with med.  Has surgery next week to replace left knee. Needs preop clearance. EKG already done. No ischemic change. Also needs labs - ordered below.   Depression screen [facility] 2/9 01/30/2018 10/31/2017 06/22/2017  Decreased Interest 0 0 0  Down, Depressed, Hopeless 0 0 0  PHQ - 2 Score 0 0 0  Altered sleeping - - -  Tired, decreased energy - - -  Change in appetite - - -  Feeling bad or failure about yourself  - - -  Trouble concentrating - - -  Moving slowly or fidgety/restless - - -  Suicidal thoughts - - -  PHQ-9 Score - - -  Difficult doing work/chores - - -  Some recent data might be hidden    History Brittany Mercer has a past medical history of Chronic narcotic use, Headache, Hereditary and idiopathic peripheral neuropathy, Hyperlipidemia, Hypertension, Hypokalemia, OA (osteoarthritis), Peripheral neuropathy, and Wears glasses.   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   Her family history includes Dementia in her father; Hyperlipidemia in her mother; Hypertension in her brother, father, and mother; Neuropathy in her father; Stroke in her mother.She reports that she quit smoking about 19 years ago. Her smoking use included cigarettes. She has a 15.00 pack-year smoking history. She has never used smokeless tobacco. She reports that she drinks about 4.0 standard drinks of alcohol per week. She reports that she does not use drugs.    ROS Review of Systems  Constitutional: Negative.     HENT: Negative for congestion.   Eyes: Negative for visual disturbance.  Respiratory: Negative for shortness of breath.   Cardiovascular: Negative for chest pain.  Gastrointestinal: Negative for abdominal pain, constipation, diarrhea, nausea and vomiting.  Genitourinary: Negative for difficulty urinating.  Musculoskeletal: Positive for arthralgias and back pain. Negative for myalgias.  Neurological: Negative for headaches.  Psychiatric/Behavioral: Negative for sleep disturbance.    Objective:  BP 132/81   Pulse 79   Temp 97.8 F (36.6 C) (Oral)   Ht '5\' 9"'  (1.753 m)   Wt 189 lb 3.2 oz (85.8 kg)   BMI 27.94 kg/m   BP Readings from Last 3 Encounters:  01/30/18 132/81  01/28/18 134/68  10/31/17 107/63    Wt Readings from Last 3 Encounters:  01/30/18 189 lb 3.2 oz (85.8 kg)  01/28/18 186 lb 2 oz (84.4 kg)  10/31/17 190 lb (86.2 kg)     Physical Exam  Constitutional: She is oriented to person, place, and time. She appears well-developed and well-nourished. No distress.  HENT:  Head: Normocephalic and atraumatic.  Eyes: Pupils are equal, round, and reactive to light. Conjunctivae are normal.  Neck: Normal range of motion.  Cardiovascular: Normal rate, regular rhythm and normal heart sounds.  No murmur heard. Pulmonary/Chest: Effort normal and breath sounds normal. No respiratory distress. She has no wheezes. She has no rales.  Abdominal: Soft. There is no tenderness.  Musculoskeletal: Normal range of motion.  Neurological: She is alert and oriented to person, place, and  time.  Skin: Skin is warm and dry.  Psychiatric: She has a normal mood and affect. Her behavior is normal.      Assessment & Plan:   Brittany Mercer was seen today for pain medication.  Diagnoses and all orders for this visit:  Arthritis of left knee  Pain management -     CBC with Differential/Platelet -     CMP14+EGFR -     Urinalysis -     Bayer DCA Hb A1c Waived -     ToxASSURE Select 13 (MW),  Urine  Hyperlipidemia with target LDL less than 100 -     CBC with Differential/Platelet -     CMP14+EGFR -     Urinalysis -     Bayer DCA Hb A1c Waived  Essential hypertension, benign -     CBC with Differential/Platelet -     CMP14+EGFR -     Urinalysis -     Bayer DCA Hb A1c Waived  Other orders -     Discontinue: metoprolol succinate (TOPROL-XL) 50 MG 24 hr tablet; TAKE (1) TABLET DAILY FOR HIGH BLOOD PRESSURE. -     Discontinue: fenofibrate 160 MG tablet; TAKE 1 TABLET DAILY FOR CHOLESTEROL AND TRIGLYCERIDE -     Discontinue: potassium chloride (K-DUR) 10 MEQ tablet; TAKE 2 TO 3 TABLETS DAILY AS DIRECTED -     Discontinue: sodium chloride (MURO 128) 5 % ophthalmic solution; Place 1 drop into the right eye daily. -     fenofibrate 160 MG tablet; TAKE 1 TABLET DAILY FOR CHOLESTEROL AND TRIGLYCERIDE -     metoprolol succinate (TOPROL-XL) 50 MG 24 hr tablet; TAKE (1) TABLET DAILY FOR HIGH BLOOD PRESSURE. -     potassium chloride (K-DUR) 10 MEQ tablet; TAKE 2 TO 3 TABLETS DAILY AS DIRECTED -     sodium chloride (MURO 128) 5 % ophthalmic solution; Place 1 drop into the right eye daily. -     Tapentadol HCl (NUCYNTA) 100 MG TABS; Take 1 tablet (100 mg total) by mouth 4 (four) times daily. -     Tapentadol HCl (NUCYNTA) 100 MG TABS; Take 1 tablet (100 mg total) by mouth 4 (four) times daily. -     Tapentadol HCl (NUCYNTA) 100 MG TABS; Take 1 tablet (100 mg total) by mouth 4 (four) times daily.       I have discontinued Brittany Hayward "Trish"'s estradiol. I am also having her start on Tapentadol HCl and Tapentadol HCl. Additionally, I am having her maintain her loratadine, fenofibrate, metoprolol succinate, potassium chloride, sodium chloride, and Tapentadol HCl. We will continue to administer cyanocobalamin.  Allergies as of 01/30/2018      Reactions   Statins Other (See Comments)   Myalgia   Penicillins Rash, Other (See Comments)   Pt not sure if she is allergic to penicillin or  sulfa. So she does not take either one.  Has patient had a PCN reaction causing immediate rash, facial/tongue/throat swelling, SOB or lightheadedness with hypotension: No Has patient had a PCN reaction causing severe rash involving mucus membranes or skin necrosis: Yes rash on arms.  Has patient had a PCN reaction that required hospitalization No Has patient had a PCN reaction occurring within the last 10 years:No If all of the above answers are "NO",   Sulfa Antibiotics Rash, Other (See Comments)   Pt not sure if she is allergic to sulfa or penicillin so she doesn't take either one.       Medication List  Accurate as of 01/30/18 11:59 PM. Always use your most recent med list.          fenofibrate 160 MG tablet TAKE 1 TABLET DAILY FOR CHOLESTEROL AND TRIGLYCERIDE   loratadine 10 MG tablet Commonly known as:  CLARITIN Take 10 mg by mouth every evening.   metoprolol succinate 50 MG 24 hr tablet Commonly known as:  TOPROL-XL TAKE (1) TABLET DAILY FOR HIGH BLOOD PRESSURE.   potassium chloride 10 MEQ tablet Commonly known as:  K-DUR TAKE 2 TO 3 TABLETS DAILY AS DIRECTED   sodium chloride 5 % ophthalmic solution Commonly known as:  MURO 128 Place 1 drop into the right eye daily.   Tapentadol HCl 100 MG Tabs Take 1 tablet (100 mg total) by mouth 4 (four) times daily.   Tapentadol HCl 100 MG Tabs Take 1 tablet (100 mg total) by mouth 4 (four) times daily. Start taking on:  02/28/2018   Tapentadol HCl 100 MG Tabs Take 1 tablet (100 mg total) by mouth 4 (four) times daily. Start taking on:  03/31/2018        Follow-up: Return in about 3 months (around 05/02/2018).  Claretta Fraise, M.D.

## 2018-01-31 LAB — CMP14+EGFR
ALT: 15 IU/L (ref 0–32)
AST: 19 IU/L (ref 0–40)
Albumin/Globulin Ratio: 2.5 — ABNORMAL HIGH (ref 1.2–2.2)
Albumin: 4.5 g/dL (ref 3.5–4.8)
Alkaline Phosphatase: 58 IU/L (ref 39–117)
BUN/Creatinine Ratio: 26 (ref 12–28)
BUN: 23 mg/dL (ref 8–27)
Bilirubin Total: 0.5 mg/dL (ref 0.0–1.2)
CO2: 23 mmol/L (ref 20–29)
Calcium: 9.8 mg/dL (ref 8.7–10.3)
Chloride: 106 mmol/L (ref 96–106)
Creatinine, Ser: 0.87 mg/dL (ref 0.57–1.00)
GFR calc Af Amer: 78 mL/min/{1.73_m2} (ref >59)
GFR calc non Af Amer: 67 mL/min/{1.73_m2} (ref >59)
Globulin, Total: 1.8 g/dL (ref 1.5–4.5)
Glucose: 83 mg/dL (ref 65–99)
Potassium: 3.9 mmol/L (ref 3.5–5.2)
Sodium: 144 mmol/L (ref 134–144)
Total Protein: 6.3 g/dL (ref 6.0–8.5)

## 2018-01-31 LAB — CBC WITH DIFFERENTIAL/PLATELET
Basophils Absolute: 0.1 10*3/uL (ref 0.0–0.2)
Basos: 1 %
EOS (ABSOLUTE): 0.6 10*3/uL — ABNORMAL HIGH (ref 0.0–0.4)
Eos: 8 %
Hematocrit: 38.1 % (ref 34.0–46.6)
Hemoglobin: 14 g/dL (ref 11.1–15.9)
Immature Grans (Abs): 0 10*3/uL (ref 0.0–0.1)
Immature Granulocytes: 0 %
Lymphocytes Absolute: 1.5 10*3/uL (ref 0.7–3.1)
Lymphs: 21 %
MCH: 33.2 pg — ABNORMAL HIGH (ref 26.6–33.0)
MCHC: 36.7 g/dL — ABNORMAL HIGH (ref 31.5–35.7)
MCV: 90 fL (ref 79–97)
Monocytes Absolute: 0.6 10*3/uL (ref 0.1–0.9)
Monocytes: 9 %
Neutrophils Absolute: 4.2 10*3/uL (ref 1.4–7.0)
Neutrophils: 61 %
Platelets: 215 10*3/uL (ref 150–450)
RBC: 4.22 x10E6/uL (ref 3.77–5.28)
RDW: 12.9 % (ref 12.3–15.4)
WBC: 6.9 10*3/uL (ref 3.4–10.8)

## 2018-02-03 ENCOUNTER — Encounter: Payer: Self-pay | Admitting: Family Medicine

## 2018-02-03 LAB — TOXASSURE SELECT 13 (MW), URINE

## 2018-02-04 ENCOUNTER — Telehealth: Payer: Self-pay | Admitting: *Deleted

## 2018-02-04 NOTE — Telephone Encounter (Signed)
Called to get correct number fax number for [facility].    [phone]

## 2018-02-05 ENCOUNTER — Inpatient Hospital Stay (HOSPITAL_COMMUNITY): Payer: Medicare Other | Admitting: Certified Registered"

## 2018-02-05 ENCOUNTER — Encounter (HOSPITAL_COMMUNITY): Payer: Self-pay | Admitting: *Deleted

## 2018-02-05 ENCOUNTER — Observation Stay (HOSPITAL_COMMUNITY)
Admission: RE | Admit: 2018-02-05 | Discharge: 2018-02-06 | Disposition: A | Discharge status: Home / Self Care | Payer: Medicare Other | Source: Ambulatory Visit | Attending: Orthopedic Surgery | Admitting: Orthopedic Surgery

## 2018-02-05 ENCOUNTER — Encounter (HOSPITAL_COMMUNITY)
Admission: RE | Disposition: A | Discharge status: Home / Self Care | Payer: Self-pay | Source: Ambulatory Visit | Attending: Orthopedic Surgery

## 2018-02-05 ENCOUNTER — Other Ambulatory Visit: Payer: Self-pay

## 2018-02-05 DIAGNOSIS — Z96651 Presence of right artificial knee joint: Secondary | ICD-10-CM | POA: Insufficient documentation

## 2018-02-05 DIAGNOSIS — Z79890 Hormone replacement therapy (postmenopausal): Secondary | ICD-10-CM | POA: Diagnosis not present

## 2018-02-05 DIAGNOSIS — Z6827 Body mass index (BMI) 27.0-27.9, adult: Secondary | ICD-10-CM | POA: Insufficient documentation

## 2018-02-05 DIAGNOSIS — Z96652 Presence of left artificial knee joint: Secondary | ICD-10-CM

## 2018-02-05 DIAGNOSIS — I10 Essential (primary) hypertension: Secondary | ICD-10-CM | POA: Insufficient documentation

## 2018-02-05 DIAGNOSIS — Z79899 Other long term (current) drug therapy: Secondary | ICD-10-CM | POA: Diagnosis not present

## 2018-02-05 DIAGNOSIS — G8918 Other acute postprocedural pain: Secondary | ICD-10-CM | POA: Diagnosis not present

## 2018-02-05 DIAGNOSIS — G609 Hereditary and idiopathic neuropathy, unspecified: Secondary | ICD-10-CM | POA: Insufficient documentation

## 2018-02-05 DIAGNOSIS — M1712 Unilateral primary osteoarthritis, left knee: Secondary | ICD-10-CM | POA: Diagnosis not present

## 2018-02-05 DIAGNOSIS — E669 Obesity, unspecified: Secondary | ICD-10-CM | POA: Insufficient documentation

## 2018-02-05 DIAGNOSIS — Z87891 Personal history of nicotine dependence: Secondary | ICD-10-CM | POA: Diagnosis not present

## 2018-02-05 DIAGNOSIS — Z96659 Presence of unspecified artificial knee joint: Secondary | ICD-10-CM

## 2018-02-05 HISTORY — PX: TOTAL KNEE ARTHROPLASTY: SHX125

## 2018-02-05 LAB — TYPE AND SCREEN
ABO/RH(D): B POS
Antibody Screen: NEGATIVE

## 2018-02-05 SURGERY — ARTHROPLASTY, KNEE, TOTAL
Anesthesia: Regional | Site: Knee | Laterality: Left

## 2018-02-05 MED ORDER — SUCCINYLCHOLINE CHLORIDE 200 MG/10ML IV SOSY
PREFILLED_SYRINGE | INTRAVENOUS | Status: AC
  Filled 2018-02-05: qty 20

## 2018-02-05 MED ORDER — PHENOL 1.4 % MT LIQD: 1.0000 | OROMUCOSAL | Status: DC | PRN

## 2018-02-05 MED ORDER — HYDROMORPHONE HCL 1 MG/ML IJ SOLN
0.5000 mg | INTRAMUSCULAR | Status: DC | PRN
  Administered 2018-02-05: 1 mg via INTRAVENOUS
  Filled 2018-02-05: qty 1

## 2018-02-05 MED ORDER — TAPENTADOL HCL 50 MG PO TABS
100.0000 mg | ORAL_TABLET | Freq: Four times a day (QID) | ORAL | Status: DC
  Administered 2018-02-05 – 2018-02-06 (×3): 100 mg via ORAL
  Filled 2018-02-05 (×3): qty 2

## 2018-02-05 MED ORDER — MAGNESIUM CITRATE PO SOLN: 1.0000 | Freq: Once | ORAL | Status: DC | PRN

## 2018-02-05 MED ORDER — DEXAMETHASONE SODIUM PHOSPHATE 10 MG/ML IJ SOLN
10.0000 mg | Freq: Once | INTRAMUSCULAR | Status: AC
  Administered 2018-02-05: 10 mg via INTRAVENOUS

## 2018-02-05 MED ORDER — SODIUM CHLORIDE (PF) 0.9 % IJ SOLN
INTRAMUSCULAR | Status: AC
  Filled 2018-02-05: qty 50

## 2018-02-05 MED ORDER — DOCUSATE SODIUM 100 MG PO CAPS
100.0000 mg | ORAL_CAPSULE | Freq: Two times a day (BID) | ORAL | Status: DC
  Administered 2018-02-05 – 2018-02-06 (×2): 100 mg via ORAL
  Filled 2018-02-05 (×2): qty 1

## 2018-02-05 MED ORDER — 0.9 % SODIUM CHLORIDE (POUR BTL) OPTIME
TOPICAL | Status: DC | PRN
  Administered 2018-02-05: 1000 mL

## 2018-02-05 MED ORDER — LORATADINE 10 MG PO TABS
10.0000 mg | ORAL_TABLET | Freq: Every evening | ORAL | Status: DC
  Administered 2018-02-05: 10 mg via ORAL
  Filled 2018-02-05: qty 1

## 2018-02-05 MED ORDER — EPHEDRINE 5 MG/ML INJ
INTRAVENOUS | Status: AC
  Filled 2018-02-05: qty 10

## 2018-02-05 MED ORDER — METHOCARBAMOL 500 MG IVPB - SIMPLE MED
500.0000 mg | Freq: Four times a day (QID) | INTRAVENOUS | Status: DC | PRN
  Administered 2018-02-05: 500 mg via INTRAVENOUS
  Filled 2018-02-05: qty 50

## 2018-02-05 MED ORDER — MIDAZOLAM HCL 2 MG/2ML IJ SOLN
INTRAMUSCULAR | Status: DC | PRN
  Administered 2018-02-05 (×2): 1 mg via INTRAVENOUS

## 2018-02-05 MED ORDER — DEXAMETHASONE SODIUM PHOSPHATE 10 MG/ML IJ SOLN
10.0000 mg | Freq: Once | INTRAMUSCULAR | Status: DC
  Filled 2018-02-05: qty 1

## 2018-02-05 MED ORDER — DIPHENHYDRAMINE HCL 12.5 MG/5ML PO ELIX: 12.5000 mg | ORAL_SOLUTION | ORAL | Status: DC | PRN

## 2018-02-05 MED ORDER — ONDANSETRON HCL 4 MG/2ML IJ SOLN
INTRAMUSCULAR | Status: AC
  Filled 2018-02-05: qty 2

## 2018-02-05 MED ORDER — PROPOFOL 10 MG/ML IV BOLUS
INTRAVENOUS | Status: DC | PRN
  Administered 2018-02-05: 30 mg via INTRAVENOUS
  Administered 2018-02-05: 20 mg via INTRAVENOUS
  Administered 2018-02-05: 10 mg via INTRAVENOUS
  Administered 2018-02-05: 20 mg via INTRAVENOUS

## 2018-02-05 MED ORDER — STERILE WATER FOR IRRIGATION IR SOLN
Status: DC | PRN
  Administered 2018-02-05: 2000 mL

## 2018-02-05 MED ORDER — DEXAMETHASONE SODIUM PHOSPHATE 10 MG/ML IJ SOLN
INTRAMUSCULAR | Status: AC
  Filled 2018-02-05: qty 1

## 2018-02-05 MED ORDER — SODIUM CHLORIDE (HYPERTONIC) 5 % OP SOLN
1.0000 [drp] | Freq: Every day | OPHTHALMIC | Status: DC
  Filled 2018-02-05: qty 15

## 2018-02-05 MED ORDER — ASPIRIN 81 MG PO CHEW
81.0000 mg | CHEWABLE_TABLET | Freq: Two times a day (BID) | ORAL | Status: DC
  Administered 2018-02-05 – 2018-02-06 (×2): 81 mg via ORAL
  Filled 2018-02-05 (×2): qty 1

## 2018-02-05 MED ORDER — MIDAZOLAM HCL 2 MG/2ML IJ SOLN
INTRAMUSCULAR | Status: AC
  Filled 2018-02-05: qty 2

## 2018-02-05 MED ORDER — EPHEDRINE SULFATE-NACL 50-0.9 MG/10ML-% IV SOSY
PREFILLED_SYRINGE | INTRAVENOUS | Status: DC | PRN
  Administered 2018-02-05: 10 mg via INTRAVENOUS

## 2018-02-05 MED ORDER — METHOCARBAMOL 500 MG PO TABS: 500.0000 mg | ORAL_TABLET | Freq: Four times a day (QID) | ORAL | 0 refills | Status: DC | PRN

## 2018-02-05 MED ORDER — CHLORHEXIDINE GLUCONATE 4 % EX LIQD: 60.0000 mL | Freq: Once | CUTANEOUS | Status: DC

## 2018-02-05 MED ORDER — POTASSIUM CHLORIDE CRYS ER 10 MEQ PO TBCR
10.0000 meq | EXTENDED_RELEASE_TABLET | Freq: Two times a day (BID) | ORAL | Status: DC
  Administered 2018-02-05: 10 meq via ORAL
  Filled 2018-02-05 (×2): qty 1

## 2018-02-05 MED ORDER — LIDOCAINE 2% (20 MG/ML) 5 ML SYRINGE
INTRAMUSCULAR | Status: AC
  Filled 2018-02-05: qty 5

## 2018-02-05 MED ORDER — SODIUM CHLORIDE (PF) 0.9 % IJ SOLN
INTRAMUSCULAR | Status: DC | PRN
  Administered 2018-02-05: 30 mL

## 2018-02-05 MED ORDER — SODIUM CHLORIDE 0.9 % IR SOLN
Status: DC | PRN
  Administered 2018-02-05: 1000 mL

## 2018-02-05 MED ORDER — ACETAMINOPHEN 500 MG PO TABS: 1000.0000 mg | ORAL_TABLET | Freq: Three times a day (TID) | ORAL | 0 refills | Status: DC

## 2018-02-05 MED ORDER — CEFAZOLIN SODIUM-DEXTROSE 2-4 GM/100ML-% IV SOLN
2.0000 g | Freq: Four times a day (QID) | INTRAVENOUS | Status: AC
  Administered 2018-02-05 (×2): 2 g via INTRAVENOUS
  Filled 2018-02-05: qty 100

## 2018-02-05 MED ORDER — FENTANYL CITRATE (PF) 100 MCG/2ML IJ SOLN
INTRAMUSCULAR | Status: AC
  Administered 2018-02-05: 50 ug via INTRAVENOUS
  Filled 2018-02-05: qty 4

## 2018-02-05 MED ORDER — OXYCODONE HCL 5 MG PO TABS: 5.0000 mg | ORAL_TABLET | ORAL | 0 refills | Status: DC | PRN

## 2018-02-05 MED ORDER — LIDOCAINE 2% (20 MG/ML) 5 ML SYRINGE
INTRAMUSCULAR | Status: AC
  Filled 2018-02-05: qty 15

## 2018-02-05 MED ORDER — ALUM & MAG HYDROXIDE-SIMETH 200-200-20 MG/5ML PO SUSP: 15.0000 mL | ORAL | Status: DC | PRN

## 2018-02-05 MED ORDER — ONDANSETRON HCL 4 MG/2ML IJ SOLN: 4.0000 mg | Freq: Once | INTRAMUSCULAR | Status: DC | PRN

## 2018-02-05 MED ORDER — POLYETHYLENE GLYCOL 3350 17 G PO PACK
17.0000 g | PACK | Freq: Two times a day (BID) | ORAL | Status: DC
  Filled 2018-02-05: qty 1

## 2018-02-05 MED ORDER — KETOROLAC TROMETHAMINE 30 MG/ML IJ SOLN
INTRAMUSCULAR | Status: AC
  Filled 2018-02-05: qty 1

## 2018-02-05 MED ORDER — METOPROLOL SUCCINATE ER 50 MG PO TB24
50.0000 mg | ORAL_TABLET | Freq: Every day | ORAL | Status: DC
  Administered 2018-02-05 – 2018-02-06 (×2): 50 mg via ORAL
  Filled 2018-02-05 (×2): qty 1

## 2018-02-05 MED ORDER — FENTANYL CITRATE (PF) 100 MCG/2ML IJ SOLN
50.0000 ug | INTRAMUSCULAR | Status: DC
  Filled 2018-02-05: qty 2

## 2018-02-05 MED ORDER — BUPIVACAINE HCL (PF) 0.25 % IJ SOLN
INTRAMUSCULAR | Status: DC | PRN
  Administered 2018-02-05: 30 mL

## 2018-02-05 MED ORDER — ONDANSETRON HCL 4 MG PO TABS: 4.0000 mg | ORAL_TABLET | Freq: Four times a day (QID) | ORAL | Status: DC | PRN

## 2018-02-05 MED ORDER — ACETAMINOPHEN 500 MG PO TABS
1000.0000 mg | ORAL_TABLET | Freq: Three times a day (TID) | ORAL | Status: DC
  Administered 2018-02-05 – 2018-02-06 (×3): 1000 mg via ORAL
  Filled 2018-02-05 (×3): qty 2

## 2018-02-05 MED ORDER — DOCUSATE SODIUM 100 MG PO CAPS: 100.0000 mg | ORAL_CAPSULE | Freq: Two times a day (BID) | ORAL | 0 refills | Status: DC

## 2018-02-05 MED ORDER — POLYETHYLENE GLYCOL 3350 17 G PO PACK: 17.0000 g | PACK | Freq: Two times a day (BID) | ORAL | 0 refills | Status: DC

## 2018-02-05 MED ORDER — METHOCARBAMOL 500 MG PO TABS
500.0000 mg | ORAL_TABLET | Freq: Four times a day (QID) | ORAL | Status: DC | PRN
  Administered 2018-02-05: 500 mg via ORAL
  Filled 2018-02-05: qty 1

## 2018-02-05 MED ORDER — PHENYLEPHRINE 40 MCG/ML (10ML) SYRINGE FOR IV PUSH (FOR BLOOD PRESSURE SUPPORT)
PREFILLED_SYRINGE | INTRAVENOUS | Status: AC
  Filled 2018-02-05: qty 20

## 2018-02-05 MED ORDER — PROPOFOL 10 MG/ML IV BOLUS
INTRAVENOUS | Status: AC
  Filled 2018-02-05: qty 20

## 2018-02-05 MED ORDER — MIDAZOLAM HCL 2 MG/2ML IJ SOLN
1.0000 mg | INTRAMUSCULAR | Status: DC
  Administered 2018-02-05: 2 mg via INTRAVENOUS
  Filled 2018-02-05: qty 2

## 2018-02-05 MED ORDER — TRANEXAMIC ACID-NACL 1000-0.7 MG/100ML-% IV SOLN
1000.0000 mg | INTRAVENOUS | Status: AC
  Administered 2018-02-05: 1000 mg via INTRAVENOUS
  Filled 2018-02-05: qty 100

## 2018-02-05 MED ORDER — MENTHOL 3 MG MT LOZG: 1.0000 | LOZENGE | OROMUCOSAL | Status: DC | PRN

## 2018-02-05 MED ORDER — PROPOFOL 500 MG/50ML IV EMUL
INTRAVENOUS | Status: DC | PRN
  Administered 2018-02-05: 50 ug/kg/min via INTRAVENOUS

## 2018-02-05 MED ORDER — BISACODYL 10 MG RE SUPP: 10.0000 mg | Freq: Every day | RECTAL | Status: DC | PRN

## 2018-02-05 MED ORDER — LIDOCAINE 2% (20 MG/ML) 5 ML SYRINGE
INTRAMUSCULAR | Status: DC | PRN
  Administered 2018-02-05: 40 mg via INTRAVENOUS
  Administered 2018-02-05 (×2): 20 mg via INTRAVENOUS

## 2018-02-05 MED ORDER — FENTANYL CITRATE (PF) 100 MCG/2ML IJ SOLN
INTRAMUSCULAR | Status: DC | PRN
  Administered 2018-02-05 (×2): 50 ug via INTRAVENOUS

## 2018-02-05 MED ORDER — BUPIVACAINE HCL (PF) 0.25 % IJ SOLN
INTRAMUSCULAR | Status: AC
  Filled 2018-02-05: qty 30

## 2018-02-05 MED ORDER — CEFAZOLIN SODIUM-DEXTROSE 2-4 GM/100ML-% IV SOLN
2.0000 g | INTRAVENOUS | Status: AC
  Administered 2018-02-05: 2 g via INTRAVENOUS
  Filled 2018-02-05: qty 100

## 2018-02-05 MED ORDER — ONDANSETRON HCL 4 MG/2ML IJ SOLN: 4.0000 mg | Freq: Four times a day (QID) | INTRAMUSCULAR | Status: DC | PRN

## 2018-02-05 MED ORDER — ROPIVACAINE HCL 5 MG/ML IJ SOLN
INTRAMUSCULAR | Status: DC | PRN
  Administered 2018-02-05: 22 mL via PERINEURAL

## 2018-02-05 MED ORDER — FENTANYL CITRATE (PF) 100 MCG/2ML IJ SOLN
INTRAMUSCULAR | Status: AC
  Filled 2018-02-05: qty 2

## 2018-02-05 MED ORDER — SUGAMMADEX SODIUM 200 MG/2ML IV SOLN
INTRAVENOUS | Status: AC
  Filled 2018-02-05: qty 2

## 2018-02-05 MED ORDER — ROCURONIUM BROMIDE 10 MG/ML (PF) SYRINGE
PREFILLED_SYRINGE | INTRAVENOUS | Status: AC
  Filled 2018-02-05: qty 30

## 2018-02-05 MED ORDER — SODIUM CHLORIDE 0.9 % IV SOLN
INTRAVENOUS | Status: DC
  Administered 2018-02-05 – 2018-02-06 (×2): via INTRAVENOUS

## 2018-02-05 MED ORDER — KETOROLAC TROMETHAMINE 30 MG/ML IJ SOLN
INTRAMUSCULAR | Status: DC | PRN
  Administered 2018-02-05: 30 mg

## 2018-02-05 MED ORDER — PHENYLEPHRINE 40 MCG/ML (10ML) SYRINGE FOR IV PUSH (FOR BLOOD PRESSURE SUPPORT)
PREFILLED_SYRINGE | INTRAVENOUS | Status: DC | PRN
  Administered 2018-02-05 (×2): 80 ug via INTRAVENOUS
  Administered 2018-02-05 (×2): 120 ug via INTRAVENOUS
  Administered 2018-02-05: 40 ug via INTRAVENOUS
  Administered 2018-02-05 (×3): 120 ug via INTRAVENOUS

## 2018-02-05 MED ORDER — METOCLOPRAMIDE HCL 5 MG/ML IJ SOLN: 5.0000 mg | Freq: Three times a day (TID) | INTRAMUSCULAR | Status: DC | PRN

## 2018-02-05 MED ORDER — ASPIRIN 81 MG PO CHEW: 81.0000 mg | CHEWABLE_TABLET | Freq: Two times a day (BID) | ORAL | 0 refills | Status: AC

## 2018-02-05 MED ORDER — PROPOFOL 10 MG/ML IV BOLUS
INTRAVENOUS | Status: AC
  Filled 2018-02-05: qty 60

## 2018-02-05 MED ORDER — OXYCODONE HCL 5 MG PO TABS
5.0000 mg | ORAL_TABLET | ORAL | Status: DC | PRN
  Administered 2018-02-05 (×2): 5 mg via ORAL
  Filled 2018-02-05 (×3): qty 1

## 2018-02-05 MED ORDER — FENOFIBRATE 160 MG PO TABS
160.0000 mg | ORAL_TABLET | Freq: Every day | ORAL | Status: DC
  Administered 2018-02-06: 160 mg via ORAL
  Filled 2018-02-05: qty 1

## 2018-02-05 MED ORDER — LACTATED RINGERS IV SOLN
INTRAVENOUS | Status: DC
  Administered 2018-02-05 (×3): via INTRAVENOUS

## 2018-02-05 MED ORDER — TRANEXAMIC ACID-NACL 1000-0.7 MG/100ML-% IV SOLN
1000.0000 mg | Freq: Once | INTRAVENOUS | Status: AC
  Administered 2018-02-05: 1000 mg via INTRAVENOUS
  Filled 2018-02-05: qty 100

## 2018-02-05 MED ORDER — BUPIVACAINE IN DEXTROSE 0.75-8.25 % IT SOLN
INTRATHECAL | Status: DC | PRN
  Administered 2018-02-05: 1.6 mL via INTRATHECAL

## 2018-02-05 MED ORDER — FENTANYL CITRATE (PF) 100 MCG/2ML IJ SOLN
25.0000 ug | INTRAMUSCULAR | Status: DC | PRN
  Administered 2018-02-05 (×2): 50 ug via INTRAVENOUS

## 2018-02-05 MED ORDER — METHOCARBAMOL 500 MG IVPB - SIMPLE MED
INTRAVENOUS | Status: AC
  Filled 2018-02-05: qty 50

## 2018-02-05 MED ORDER — ONDANSETRON HCL 4 MG/2ML IJ SOLN
INTRAMUSCULAR | Status: DC | PRN
  Administered 2018-02-05: 4 mg via INTRAVENOUS

## 2018-02-05 MED ORDER — OXYCODONE HCL 5 MG PO TABS
10.0000 mg | ORAL_TABLET | ORAL | Status: DC | PRN
  Administered 2018-02-06 (×2): 10 mg via ORAL
  Filled 2018-02-05 (×2): qty 2

## 2018-02-05 MED ORDER — FERROUS SULFATE 325 (65 FE) MG PO TABS: 325.0000 mg | ORAL_TABLET | Freq: Three times a day (TID) | ORAL | 3 refills | Status: DC

## 2018-02-05 MED ORDER — METOCLOPRAMIDE HCL 5 MG PO TABS: 5.0000 mg | ORAL_TABLET | Freq: Three times a day (TID) | ORAL | Status: DC | PRN

## 2018-02-05 MED ORDER — FERROUS SULFATE 325 (65 FE) MG PO TABS
325.0000 mg | ORAL_TABLET | Freq: Two times a day (BID) | ORAL | Status: DC
  Administered 2018-02-05 – 2018-02-06 (×2): 325 mg via ORAL
  Filled 2018-02-05 (×2): qty 1

## 2018-02-05 SURGICAL SUPPLY — 52 items
ATTUNE MED ANAT PAT 35 KNEE (Knees) ×2 IMPLANT
ATTUNE PSFEM LTSZ5 NARCEM KNEE (Femur) ×2 IMPLANT
ATTUNE PSRP INSR SZ5 5 KNEE (Insert) ×2 IMPLANT
BAG ZIPLOCK 12X15 (MISCELLANEOUS) IMPLANT
BANDAGE ACE 6X5 VEL STRL LF (GAUZE/BANDAGES/DRESSINGS) ×2 IMPLANT
BASEPLATE TIBIAL ROTATING SZ 4 (Knees) ×2 IMPLANT
BLADE SAW SGTL 11.0X1.19X90.0M (BLADE) IMPLANT
BLADE SAW SGTL 13.0X1.19X90.0M (BLADE) ×2 IMPLANT
BNDG ELASTIC 6X10 VLCR STRL LF (GAUZE/BANDAGES/DRESSINGS) ×2 IMPLANT
BOWL SMART MIX CTS (DISPOSABLE) ×2 IMPLANT
CEMENT HV SMART SET (Cement) ×4 IMPLANT
COVER SURGICAL LIGHT HANDLE (MISCELLANEOUS) ×2 IMPLANT
COVER WAND RF STERILE (DRAPES) IMPLANT
CUFF TOURN SGL QUICK 34 (TOURNIQUET CUFF) ×1
CUFF TRNQT CYL 34X4X40X1 (TOURNIQUET CUFF) ×1 IMPLANT
DECANTER SPIKE VIAL GLASS SM (MISCELLANEOUS) ×4 IMPLANT
DERMABOND ADVANCED (GAUZE/BANDAGES/DRESSINGS) ×1
DERMABOND ADVANCED .7 DNX12 (GAUZE/BANDAGES/DRESSINGS) ×1 IMPLANT
DRAPE U-SHAPE 47X51 STRL (DRAPES) ×2 IMPLANT
DRESSING AQUACEL AG SP 3.5X10 (GAUZE/BANDAGES/DRESSINGS) ×1 IMPLANT
DRSG AQUACEL AG ADV 3.5X10 (GAUZE/BANDAGES/DRESSINGS) ×2 IMPLANT
DRSG AQUACEL AG SP 3.5X10 (GAUZE/BANDAGES/DRESSINGS) ×2
DURAPREP 26ML APPLICATOR (WOUND CARE) ×4 IMPLANT
ELECT REM PT RETURN 15FT ADLT (MISCELLANEOUS) ×2 IMPLANT
GLOVE BIOGEL M 7.0 STRL (GLOVE) IMPLANT
GLOVE BIOGEL PI IND STRL 7.5 (GLOVE) ×1 IMPLANT
GLOVE BIOGEL PI IND STRL 8.5 (GLOVE) ×1 IMPLANT
GLOVE BIOGEL PI INDICATOR 7.5 (GLOVE) ×1
GLOVE BIOGEL PI INDICATOR 8.5 (GLOVE) ×1
GLOVE ECLIPSE 8.0 STRL XLNG CF (GLOVE) ×2 IMPLANT
GLOVE ORTHO TXT STRL SZ7.5 (GLOVE) ×4 IMPLANT
GOWN STRL REUS W/TWL 2XL LVL3 (GOWN DISPOSABLE) ×2 IMPLANT
GOWN STRL REUS W/TWL LRG LVL3 (GOWN DISPOSABLE) ×2 IMPLANT
HANDPIECE INTERPULSE COAX TIP (DISPOSABLE) ×1
HOLDER FOLEY CATH W/STRAP (MISCELLANEOUS) IMPLANT
MANIFOLD NEPTUNE II (INSTRUMENTS) ×2 IMPLANT
NDL SAFETY ECLIPSE 18X1.5 (NEEDLE) IMPLANT
NEEDLE HYPO 18GX1.5 SHARP (NEEDLE)
PACK TOTAL KNEE CUSTOM (KITS) ×2 IMPLANT
POSITIONER SURGICAL ARM (MISCELLANEOUS) ×2 IMPLANT
SET HNDPC FAN SPRY TIP SCT (DISPOSABLE) ×1 IMPLANT
SET PAD KNEE POSITIONER (MISCELLANEOUS) ×2 IMPLANT
SUT MNCRL AB 4-0 PS2 18 (SUTURE) ×2 IMPLANT
SUT STRATAFIX PDS+ 0 24IN (SUTURE) ×2 IMPLANT
SUT VIC AB 1 CT1 36 (SUTURE) ×2 IMPLANT
SUT VIC AB 2-0 CT1 27 (SUTURE) ×3
SUT VIC AB 2-0 CT1 TAPERPNT 27 (SUTURE) ×3 IMPLANT
SYRINGE 3CC LL L/F (MISCELLANEOUS) ×2 IMPLANT
TRAY FOLEY MTR SLVR 16FR STAT (SET/KITS/TRAYS/PACK) ×2 IMPLANT
WATER STERILE IRR 1000ML POUR (IV SOLUTION) ×2 IMPLANT
WRAP KNEE MAXI GEL POST OP (GAUZE/BANDAGES/DRESSINGS) ×2 IMPLANT
YANKAUER SUCT BULB TIP 10FT TU (MISCELLANEOUS) ×2 IMPLANT

## 2018-02-05 NOTE — Anesthesia Procedure Notes (Signed)
Procedure Name: MAC Date/Time: 02/05/2018 10:00 AM Performed by: Cynda Familia, CRNA Pre-anesthesia Checklist: Patient identified, Emergency Drugs available, Suction available, Patient being monitored and Timeout performed Patient Re-evaluated:Patient Re-evaluated prior to induction Oxygen Delivery Method: Simple face mask Placement Confirmation: positive ETCO2 and breath sounds checked- equal and bilateral Dental Injury: Teeth and Oropharynx as per pre-operative assessment  Comments: Sedation for spinal

## 2018-02-05 NOTE — Op Note (Signed)
NAME:  Brittany Mercer                      MEDICAL RECORD NO.:  [id number]                             FACILITY:  [facility]      PHYSICIAN:  Pietro Cassis. Alvan Dame, M.D.  DATE OF BIRTH:  1945/08/23      DATE OF PROCEDURE:  02/05/2018                                     OPERATIVE REPORT         PREOPERATIVE DIAGNOSIS:  Left knee osteoarthritis.      POSTOPERATIVE DIAGNOSIS:  Left knee osteoarthritis.      FINDINGS:  The patient was noted to have complete loss of cartilage and   bone-on-bone arthritis with associated osteophytes in the medial and patellofemoral compartments of   the knee with a significant synovitis and associated effusion.  The patient had failed months of conservative treatment including medications, injection therapy, activity modification.     PROCEDURE:  Left total knee replacement.      COMPONENTS USED:  DePuy Attune rotating platform posterior stabilized knee   system, a size 5N femur, 4 tibia, size 5 mm PS AOX insert, and 35 anatomic patellar   button.      SURGEON:  Pietro Cassis. Alvan Dame, M.D.      ASSISTANT:  Danae Orleans, PA-C.      ANESTHESIA:  Regional and Spinal.      SPECIMENS:  None.      COMPLICATION:  None.      DRAINS:  None.  EBL: <100cc      TOURNIQUET TIME:   Total Tourniquet Time Documented: Thigh (Left) - 29 minutes Total: Thigh (Left) - 29 minutes  .      The patient was stable to the recovery room.      INDICATION FOR PROCEDURE:  Brittany Mercer is a 72 y.o. female patient of   mine.  The patient had been seen, evaluated, and treated for months conservatively in the   office with medication, activity modification, and injections.  The patient had   radiographic changes of bone-on-bone arthritis with endplate sclerosis and osteophytes noted.  Based on the radiographic changes and failed conservative measures, the patient   decided to proceed with definitive treatment, total knee replacement.  Risks of infection, DVT, component failure,  need for revision surgery, neurovascular injury were reviewed in the office setting.  The postop course was reviewed stressing the efforts to maximize post-operative satisfaction and function.  Consent was obtained for benefit of pain   relief.      PROCEDURE IN DETAIL:  The patient was brought to the operative theater.   Once adequate anesthesia, preoperative antibiotics, 2 gm of Ancef,1 gm of Tranexamic Acid, and 10 mg of Decadron administered, the patient was positioned supine with a left thigh tourniquet placed.  The  left lower extremity was prepped and draped in sterile fashion.  A time-   out was performed identifying the patient, planned procedure, and the appropriate extremity.      The left lower extremity was placed in the [facility] leg holder.  The leg was   exsanguinated, tourniquet elevated to 250 mmHg.  A midline incision was   made followed by  median parapatellar arthrotomy.  Following initial   exposure, attention was first directed to the patella.  Precut   measurement was noted to be 21 mm.  I resected down to 13-14 mm and used a   35 anatomic patellar button to restore patellar height as well as cover the cut surface.      The lug holes were drilled and a metal shim was placed to protect the   patella from retractors and saw blade during the procedure.      At this point, attention was now directed to the femur.  The femoral   canal was opened with a drill, irrigated to try to prevent fat emboli.  An   intramedullary rod was passed at 3 degrees valgus, 9 mm of bone was   resected off the distal femur.  Following this resection, the tibia was   subluxated anteriorly.  Using the extramedullary guide, 2 mm of bone was resected off   the proximal medial tibia.  We confirmed the gap would be   stable medially and laterally with a size 5 spacer block as well as confirmed that the tibial cut was perpendicular in the coronal plane, checking with an alignment rod.      Once this was  done, I sized the femur to be a size 5 in the anterior-   posterior dimension, chose a narrow component based on medial and   lateral dimension.  The size 5 rotation block was then pinned in   position anterior referenced using the C-clamp to set rotation.  The   anterior, posterior, and  chamfer cuts were made without difficulty nor   notching making certain that I was along the anterior cortex to help   with flexion gap stability.      The final box cut was made off the lateral aspect of distal femur.      At this point, the tibia was sized to be a size 4.  The size 4 tray was   then pinned in position through the medial third of the tubercle,   drilled, and keel punched.  Trial reduction was now carried with a 5 femur,  4 tibia, a size 5 mm PS insert, and the 35 anatomic patella botton.  The knee was brought to full extension with good flexion stability with the patella   tracking through the trochlea without application of pressure.  Given   all these findings the trial components removed.  Final components were   opened and cement was mixed.  The knee was irrigated with normal saline solution and pulse lavage.  The synovial lining was   then injected with 30 cc of 0.25% Marcaine with epinephrine, 1 cc of Toradol and 30 cc of NS for a total of 61 cc.     Final implants were then cemented onto cleaned and dried cut surfaces of bone with the knee brought to extension with a size 5 mm PS trial insert.      Once the cement had fully cured, excess cement was removed   throughout the knee.  I confirmed that I was satisfied with the range of   motion and stability, and the final size 5 mm PS AOX insert was chosen.  It was   placed into the knee.      The tourniquet had been let down at 29 minutes.  No significant   hemostasis was required.  The extensor mechanism was then reapproximated using #1 Vicryl and #1 Stratafix  sutures with the knee   in flexion.  The   remaining wound was closed  with 2-0 Vicryl and running 4-0 Monocryl.   The knee was cleaned, dried, dressed sterilely using Dermabond and   Aquacel dressing.  The patient was then   brought to recovery room in stable condition, tolerating the procedure   well.   Please note that Physician Assistant, Danae Orleans, PA-C was present for the entirety of the case, and was utilized for pre-operative positioning, peri-operative retractor management, general facilitation of the procedure and for primary wound closure at the end of the case.              Pietro Cassis Alvan Dame, M.D.    02/05/2018 11:29 AM

## 2018-02-05 NOTE — Progress Notes (Signed)
Assisted Dr. Ellender with left, ultrasound guided, adductor canal block. Side rails up, monitors on throughout procedure. See vital signs in flow sheet. Tolerated Procedure well.  

## 2018-02-05 NOTE — Anesthesia Procedure Notes (Signed)
Spinal  Patient location during procedure: OR Start time: 02/05/2018 10:10 AM End time: 02/05/2018 10:20 AM Staffing Anesthesiologist: Murvin Natal, MD Performed: anesthesiologist  Preanesthetic Checklist Completed: patient identified, surgical consent, pre-op evaluation, timeout performed, IV checked, risks and benefits discussed and monitors and equipment checked Spinal Block Patient position: sitting Prep: DuraPrep Patient monitoring: cardiac monitor, continuous pulse ox and blood pressure Approach: left paramedian Location: L4-5 Injection technique: single-shot Needle Needle type: Pencan  Needle gauge: 24 G Needle length: 9 cm Assessment Sensory level: T10 Additional Notes Functioning IV was confirmed and monitors were applied. Sterile prep and drape, including hand hygiene and sterile gloves were used. The patient was positioned and the spine was prepped. The skin was anesthetized with lidocaine.  Previous unsuccessful attempts by CRNA. Free flow of clear CSF was obtained prior to injecting local anesthetic into the CSF.  The spinal needle aspirated freely following injection.  The needle was carefully withdrawn.  The patient tolerated the procedure well.

## 2018-02-05 NOTE — Anesthesia Postprocedure Evaluation (Signed)
Anesthesia Post Note  Patient: Brittany Mercer  Procedure(s) Performed: LEFT TOTAL KNEE ARTHROPLASTY (Left Knee)     Patient location during evaluation: PACU Anesthesia Type: Regional and Spinal Level of consciousness: oriented and awake and alert Pain management: pain level controlled Vital Signs Assessment: post-procedure vital signs reviewed and stable Respiratory status: spontaneous breathing, respiratory function stable and patient connected to nasal cannula oxygen Cardiovascular status: blood pressure returned to baseline and stable Postop Assessment: no headache, no backache, no apparent nausea or vomiting and spinal receding Anesthetic complications: no    Last Vitals:  Vitals:   02/05/18 1437 02/05/18 1555  BP: 133/77 (!) 141/67  Pulse: 71 80  Resp: 16 18  Temp: (!) 36.3 C 36.8 C  SpO2: 95% 97%    Last Pain:  Vitals:   02/05/18 1555  TempSrc: Oral  PainSc:                  Brittany Mercer

## 2018-02-05 NOTE — Anesthesia Procedure Notes (Signed)
Anesthesia Regional Block: Adductor canal block   Pre-Anesthetic Checklist: ,, timeout performed, Correct Patient, Correct Site, Correct Laterality, Correct Procedure,, site marked, risks and benefits discussed, Surgical consent,  Pre-op evaluation,  At surgeon's request and post-op pain management  Laterality: Left  Prep: chloraprep       Needles:  Injection technique: Single-shot  Needle Type: Echogenic Stimulator Needle     Needle Length: 10cm  Needle Gauge: 21     Additional Needles:   Procedures:,,,, ultrasound used (permanent image in chart),,,,  Narrative:  Start time: 02/05/2018 9:00 AM End time: 02/05/2018 9:10 AM Injection made incrementally with aspirations every 5 mL.  Performed by: Personally  Anesthesiologist: Murvin Natal, MD  Additional Notes: Functioning IV was confirmed and monitors were applied. A time-out was performed. Hand hygiene and sterile gloves were used. The thigh was placed in a frog-leg position and prepped in a sterile fashion. A 139mm 21ga Pajunk echogenic stimulator needle was placed using ultrasound guidance.  Negative aspiration and negative test dose prior to incremental administration of local anesthetic. The patient tolerated the procedure well.

## 2018-02-05 NOTE — Discharge Instructions (Signed)

## 2018-02-05 NOTE — Evaluation (Signed)
Physical Therapy Evaluation Patient Details Name: Brittany Mercer MRN: [id number] DOB: 09/28/1945 Today's Date: 02/05/2018   History of Present Illness  L  TKA, h/o R TKA 12/2014  Clinical Impression  Pt is s/p TKA resulting in the deficits listed below (see PT Problem List). Pt ambulated 30' with RW, distance limited by pain. Good progress expected.  Pt will benefit from skilled PT to increase their independence and safety with mobility to allow discharge to the venue listed below.      Follow Up Recommendations Outpatient PT;Follow surgeon's recommendation for DC plan and follow-up therapies    Equipment Recommendations  None recommended by PT    Recommendations for Other Services       Precautions / Restrictions Precautions Precautions: Knee Precaution Comments: reviewed no pillow under knee Restrictions Weight Bearing Restrictions: No Other Position/Activity Restrictions: WBAT      Mobility  Bed Mobility Overal bed mobility: Needs Assistance Bed Mobility: Supine to Sit     Supine to sit: Min assist     General bed mobility comments: min A for RLE  Transfers Overall transfer level: Needs assistance Equipment used: Rolling walker (2 wheeled) Transfers: Sit to/from Stand Sit to Stand: Min assist         General transfer comment: VCs hand placement  Ambulation/Gait Ambulation/Gait assistance: Min guard Gait Distance (Feet): 30 Feet Assistive device: Rolling walker (2 wheeled) Gait Pattern/deviations: Step-to pattern;Decreased stride length;Antalgic;Decreased weight shift to left     General Gait Details: VCs sequencing, distance limited by pain  Stairs            Wheelchair Mobility    Modified Rankin (Stroke Patients Only)       Balance Overall balance assessment: Modified Independent                                           Pertinent Vitals/Pain Pain Assessment: 0-10 Pain Score: 10-Worst pain ever Pain Location:  L knee Pain Descriptors / Indicators: Sore Pain Intervention(s): Limited activity within patient's tolerance;Monitored during session;Premedicated before session;Ice applied    Home Living Family/patient expects to be discharged to:: Private residence Living Arrangements: Spouse/significant other Available Help at Discharge: Available 24 hours/day Type of Home: House Home Access: Level entry     Home Layout: One level Home Equipment: Environmental consultant - 2 wheels;Walker - 4 wheels;Shower seat;Cane - single point      Prior Function Level of Independence: Independent               Hand Dominance        Extremity/Trunk Assessment   Upper Extremity Assessment Upper Extremity Assessment: Overall WFL for tasks assessed    Lower Extremity Assessment Lower Extremity Assessment: RLE deficits/detail RLE Deficits / Details: R knee 15-40* AAROM, limited by pain RLE Sensation: WNL(intact to light touch)    Cervical / Trunk Assessment Cervical / Trunk Assessment: Normal  Communication   Communication: No difficulties  Cognition Arousal/Alertness: Awake/alert Behavior During Therapy: WFL for tasks assessed/performed Overall Cognitive Status: Within Functional Limits for tasks assessed                                        General Comments      Exercises Total Joint Exercises Ankle Circles/Pumps: AROM;Both;10 reps;Supine Quad Sets: AROM;Both;5 reps Heel Slides: AAROM;Left;10  reps;Supine   Assessment/Plan    PT Assessment Patient needs continued PT services  PT Problem List Decreased strength;Decreased range of motion;Decreased activity tolerance;Decreased mobility;Pain       PT Treatment Interventions Gait training;DME instruction;Therapeutic activities;Therapeutic exercise    PT Goals (Current goals can be found in the Care Plan section)  Acute Rehab PT Goals Patient Stated Goal: to walk without pain PT Goal Formulation: With patient Time For Goal  Achievement: 02/12/18 Potential to Achieve Goals: Good    Frequency 7X/week   Barriers to discharge        Co-evaluation               AM-PAC PT "6 Clicks" Daily Activity  Outcome Measure Difficulty turning over in bed (including adjusting bedclothes, sheets and blankets)?: A Lot Difficulty moving from lying on back to sitting on the side of the bed? : Unable Difficulty sitting down on and standing up from a chair with arms (e.g., wheelchair, bedside commode, etc,.)?: A Little Help needed moving to and from a bed to chair (including a wheelchair)?: A Little Help needed walking in hospital room?: A Little Help needed climbing 3-5 steps with a railing? : A Lot 6 Click Score: 14    End of Session Equipment Utilized During Treatment: Gait belt Activity Tolerance: Patient limited by pain Patient left: in chair;with call bell/phone within reach;with family/visitor present Nurse Communication: Mobility status PT Visit Diagnosis: Difficulty in walking, not elsewhere classified (R26.2);Pain Pain - Right/Left: Left Pain - part of body: Knee    Time: 6503-5465 PT Time Calculation (min) (ACUTE ONLY): 21 min   Charges:   PT Evaluation $PT Eval Low Complexity: 1 Low         Blondell Reveal Kistler PT 02/05/2018  Acute Rehabilitation Services Pager [phone] Office [phone]

## 2018-02-05 NOTE — Care Plan (Signed)
Ortho Bundle Case Management Note  Patient Details  Name: Brittany Mercer MRN: [id number] Date of Birth: [birth year]  L TKA scheduled on 02-05-18 DCP:  Home with significant other.  1 story home with 0 ste. DME:  No needs.   PT:  [facility].  PT eval scheduled on 02-11-18.               DME Arranged:  N/A(Has a RW and doesn't want a 3-in-1) DME Agency:  NA  HH Arranged:  NA HH Agency:  NA  Additional Comments: Please contact me with any questions of if this plan should need to change.  Marianne Sofia, RN,CCM [facility]  [phone] 02/05/2018, 12:20 PM

## 2018-02-05 NOTE — Transfer of Care (Signed)
Immediate Anesthesia Transfer of Care Note  Patient: Brittany Mercer  Procedure(s) Performed: LEFT TOTAL KNEE ARTHROPLASTY (Left Knee)  Patient Location: PACU  Anesthesia Type:Spinal  Level of Consciousness: sedated  Airway & Oxygen Therapy: Patient Spontanous Breathing and Patient connected to face mask oxygen  Post-op Assessment: Report given to RN and Post -op Vital signs reviewed and stable  Post vital signs: Reviewed and stable  Last Vitals:  Vitals Value Taken Time  BP 116/61 02/05/2018 11:57 AM  Temp    Pulse 64 02/05/2018 11:58 AM  Resp 13 02/05/2018 11:58 AM  SpO2 97 % 02/05/2018 11:58 AM  Vitals shown include unvalidated device data.  Last Pain:  Vitals:   02/05/18 0759  TempSrc:   PainSc: 3       Patients Stated Pain Goal: 4 (21/22/48 2500)  Complications: No apparent anesthesia complications

## 2018-02-05 NOTE — Interval H&P Note (Signed)
History and Physical Interval Note:  02/05/2018 8:43 AM  Brittany Mercer  has presented today for surgery, with the diagnosis of Left knee osteoarthritis  The various methods of treatment have been discussed with the patient and family. After consideration of risks, benefits and other options for treatment, the patient has consented to  Procedure(s) with comments: LEFT TOTAL KNEE ARTHROPLASTY (Left) - 70 mins as a surgical intervention .  The patient's history has been reviewed, patient examined, no change in status, stable for surgery.  I have reviewed the patient's chart and labs.  Questions were answered to the patient's satisfaction.     Mauri Pole

## 2018-02-05 NOTE — Plan of Care (Signed)
Plan of care 

## 2018-02-05 NOTE — Anesthesia Procedure Notes (Signed)
Performed by: Jeanetta Alonzo M, CRNA       

## 2018-02-05 NOTE — Anesthesia Preprocedure Evaluation (Addendum)
Anesthesia Evaluation  Patient identified by MRN, date of birth, ID band Patient awake    Reviewed: Allergy & Precautions, NPO status , Patient's Chart, lab work & pertinent test results, reviewed documented beta blocker date and time   Airway Mallampati: III  TM Distance: >3 FB Neck ROM: Full    Dental no notable dental hx.    Pulmonary former smoker,    Pulmonary exam normal breath sounds clear to auscultation       Cardiovascular hypertension, Pt. on medications and Pt. on home beta blockers Normal cardiovascular exam Rhythm:Regular Rate:Normal  ECG: Normal sinus rhythm Left ventricular hypertrophy, rate 62   Neuro/Psych  Headaches, Hereditary and idiopathic peripheral neuropathy Burning sensation to bilateral upper and lower extremities Motor and sensation intact to bilateral lower extremities  Chronic narcotic use, takes Nucynta for neuropathic pain   Neuromuscular disease negative psych ROS   GI/Hepatic negative GI ROS, Neg liver ROS,   Endo/Other  negative endocrine ROS  Renal/GU negative Renal ROS     Musculoskeletal  (+) Arthritis ,   Abdominal   Peds  Hematology negative hematology ROS (+) HLD   Anesthesia Other Findings Left knee osteoarthritis  Reproductive/Obstetrics                            Anesthesia Physical Anesthesia Plan  ASA: II  Anesthesia Plan: Spinal and Regional   Post-op Pain Management:    Induction: Intravenous  PONV Risk Score and Plan: 2 and Ondansetron, Dexamethasone, Midazolam and Treatment may vary due to age or medical condition  Airway Management Planned: Natural Airway  Additional Equipment:   Intra-op Plan:   Post-operative Plan:   Informed Consent: I have reviewed the patients History and Physical, chart, labs and discussed the procedure including the risks, benefits and alternatives for the proposed anesthesia with the patient or  authorized representative who has indicated his/her understanding and acceptance.   Dental advisory given  Plan Discussed with: CRNA  Anesthesia Plan Comments:         Anesthesia Quick Evaluation

## 2018-02-06 ENCOUNTER — Encounter (HOSPITAL_COMMUNITY): Payer: Self-pay | Admitting: Orthopedic Surgery

## 2018-02-06 DIAGNOSIS — M1712 Unilateral primary osteoarthritis, left knee: Secondary | ICD-10-CM | POA: Diagnosis not present

## 2018-02-06 DIAGNOSIS — Z96651 Presence of right artificial knee joint: Secondary | ICD-10-CM | POA: Diagnosis not present

## 2018-02-06 DIAGNOSIS — G609 Hereditary and idiopathic neuropathy, unspecified: Secondary | ICD-10-CM | POA: Diagnosis not present

## 2018-02-06 DIAGNOSIS — Z79899 Other long term (current) drug therapy: Secondary | ICD-10-CM | POA: Diagnosis not present

## 2018-02-06 DIAGNOSIS — I10 Essential (primary) hypertension: Secondary | ICD-10-CM | POA: Diagnosis not present

## 2018-02-06 DIAGNOSIS — E669 Obesity, unspecified: Secondary | ICD-10-CM | POA: Diagnosis not present

## 2018-02-06 LAB — CBC
HCT: 32.7 % — ABNORMAL LOW (ref 36.0–46.0)
Hemoglobin: 11.3 g/dL — ABNORMAL LOW (ref 12.0–15.0)
MCH: 32.2 pg (ref 26.0–34.0)
MCHC: 34.6 g/dL (ref 30.0–36.0)
MCV: 93.2 fL (ref 80.0–100.0)
Platelets: 147 10*3/uL — ABNORMAL LOW (ref 150–400)
RBC: 3.51 MIL/uL — ABNORMAL LOW (ref 3.87–5.11)
RDW: 12.4 % (ref 11.5–15.5)
WBC: 9.5 10*3/uL (ref 4.0–10.5)
nRBC: 0 % (ref 0.0–0.2)

## 2018-02-06 LAB — BASIC METABOLIC PANEL
Anion gap: 7 (ref 5–15)
BUN: 23 mg/dL (ref 8–23)
CO2: 25 mmol/L (ref 22–32)
Calcium: 8.2 mg/dL — ABNORMAL LOW (ref 8.9–10.3)
Chloride: 109 mmol/L (ref 98–111)
Creatinine, Ser: 0.88 mg/dL (ref 0.44–1.00)
GFR calc Af Amer: >60 mL/min (ref >60)
GFR calc non Af Amer: >60 mL/min (ref >60)
Glucose, Bld: 132 mg/dL — ABNORMAL HIGH (ref 70–99)
Potassium: 4.1 mmol/L (ref 3.5–5.1)
Sodium: 141 mmol/L (ref 135–145)

## 2018-02-06 NOTE — Care Management Obs Status (Signed)
[name] NOTIFICATION   Patient Details  Name: Brittany Mercer MRN: [id number] Date of Birth: December 28, 1945   Medicare Observation Status Notification Given:  Yes    Guadalupe Maple, RN 02/06/2018, 9:50 AM

## 2018-02-06 NOTE — Plan of Care (Signed)
  Problem: Health Behavior/Discharge Planning: Goal: Ability to manage health-related needs will improve Outcome: Progressing   Problem: Clinical Measurements: Goal: Ability to maintain clinical measurements within normal limits will improve Outcome: Progressing Goal: Will remain free from infection Outcome: Progressing Goal: Diagnostic test results will improve Outcome: Progressing Goal: Respiratory complications will improve Outcome: Progressing Goal: Cardiovascular complication will be avoided Outcome: Progressing   Problem: Activity: Goal: Risk for activity intolerance will decrease Outcome: Progressing   Problem: Nutrition: Goal: Adequate nutrition will be maintained Outcome: Progressing   Problem: Coping: Goal: Level of anxiety will decrease Outcome: Progressing   Problem: Elimination: Goal: Will not experience complications related to bowel motility Outcome: Progressing Goal: Will not experience complications related to urinary retention Outcome: Progressing   Problem: Safety: Goal: Ability to remain free from injury will improve Outcome: Progressing   Problem: Skin Integrity: Goal: Risk for impaired skin integrity will decrease Outcome: Progressing   Problem: Activity: Goal: Ability to avoid complications of mobility impairment will improve Outcome: Progressing Goal: Range of joint motion will improve Outcome: Progressing   Problem: Clinical Measurements: Goal: Postoperative complications will be avoided or minimized Outcome: Progressing   Problem: Pain Management: Goal: Pain level will decrease with appropriate interventions Outcome: Progressing

## 2018-02-06 NOTE — Progress Notes (Addendum)
     Subjective: 1 Day Post-Op Procedure(s) (LRB): LEFT TOTAL KNEE ARTHROPLASTY (Left)   Patient reports pain as mild, pain controlled.  No events throughout the night.     Patient's anticipated LOS is less than 2 midnights, meeting these requirements: - Lives within 1 hour of care - Has a competent adult at home to recover with post-op recover - NO history of  - Chronic pain requiring opiods  - Diabetes  - Coronary Artery Disease  - Heart failure  - Heart attack  - Stroke  - DVT/VTE  - Cardiac arrhythmia  - Respiratory Failure/COPD  - Renal failure  - Anemia  - Advanced Liver disease    Objective:   VITALS:   Vitals:   02/06/18 0146 02/06/18 0527  BP: 129/62 116/61  Pulse: 67 (!) 58  Resp: (!) 22 16  Temp: 98.1 F (36.7 C) 97.8 F (36.6 C)  SpO2: 98% 99%    Dorsiflexion/Plantar flexion intact Incision: dressing C/D/I No cellulitis present Compartment soft  LABS Recent Labs    02/06/18 0534  HGB 11.3*  HCT 32.7*  WBC 9.5  PLT 147*    Recent Labs    02/06/18 0534  NA 141  K 4.1  BUN 23  CREATININE 0.88  GLUCOSE 132*     Assessment/Plan: 1 Day Post-Op Procedure(s) (LRB): LEFT TOTAL KNEE ARTHROPLASTY (Left) Foley cath d/c'ed Advance diet Up with therapy D/C IV fluids Discharge home Follow up in 2 weeks at [facility] ([place]). Follow up with OLIN,Jazell Rosenau D in 2 weeks.  Contact information:  [facility]) [address] [place] [phone]    Overweight (BMI 25-29.9) Estimated body mass index is 27.94 kg/m as calculated from the following:   Height as of this encounter: 5\' 9"  (1.753 m).   Weight as of this encounter: 85.8 kg. Patient also counseled that weight may inhibit the healing process Patient counseled that losing weight will help with future health issues         [place] Pugh. Mansel Strother   PAC  02/06/2018, 9:08 AM

## 2018-02-06 NOTE — Progress Notes (Signed)
Physical Therapy Treatment Patient Details Name: Brittany Mercer MRN: [id number] DOB: October 07, 1945 Today's Date: 02/06/2018    History of Present Illness L  TKA, h/o R TKA 12/2014    PT Comments    Pt is progressing well with mobility and is ready to DC home from PT standpoint. She ambulated 120' with RW and completed HEP with min assist. Pt reports she had a difficult night last night due to severe LLE pain. L knee extension AAROM is ~ -15*, she reports increased pain with L knee in extended position.    Follow Up Recommendations  Outpatient PT;Follow surgeon's recommendation for DC plan and follow-up therapies     Equipment Recommendations  None recommended by PT    Recommendations for Other Services       Precautions / Restrictions Precautions Precautions: Knee Precaution Booklet Issued: Yes (comment) Precaution Comments: reviewed no pillow under knee Restrictions Weight Bearing Restrictions: No Other Position/Activity Restrictions: WBAT    Mobility  Bed Mobility Overal bed mobility: Modified Independent Bed Mobility: Supine to Sit     Supine to sit: Modified independent (Device/Increase time)     General bed mobility comments: HOB up  Transfers Overall transfer level: Needs assistance Equipment used: Rolling walker (2 wheeled) Transfers: Sit to/from Stand Sit to Stand: Supervision         General transfer comment: VCs hand placement  Ambulation/Gait Ambulation/Gait assistance: Supervision Gait Distance (Feet): 120 Feet Assistive device: Rolling walker (2 wheeled) Gait Pattern/deviations: Step-to pattern;Decreased stride length;Antalgic;Decreased weight shift to left Gait velocity: decr   General Gait Details: good sequencing, no loss of balance   Stairs             Wheelchair Mobility    Modified Rankin (Stroke Patients Only)       Balance Overall balance assessment: Modified Independent                                          Cognition Arousal/Alertness: Awake/alert Behavior During Therapy: WFL for tasks assessed/performed Overall Cognitive Status: Within Functional Limits for tasks assessed                                        Exercises Total Joint Exercises Ankle Circles/Pumps: AROM;Both;10 reps;Supine Quad Sets: AROM;Both;5 reps Short Arc Quad: AAROM;Left;10 reps;Supine Heel Slides: AAROM;Left;10 reps;Supine Hip ABduction/ADduction: AROM;Left;10 reps;Supine Straight Leg Raises: AAROM;Left;10 reps;Supine Long Arc Quad: AAROM;Left;5 reps;Seated Knee Flexion: AAROM;Left;Seated;10 reps Goniometric ROM: 15-50* AAROM L knee    General Comments        Pertinent Vitals/Pain Pain Score: 7  Pain Location: L knee Pain Descriptors / Indicators: Sore Pain Intervention(s): Limited activity within patient's tolerance;Monitored during session;Premedicated before session;Ice applied    Home Living                      Prior Function            PT Goals (current goals can now be found in the care plan section) Acute Rehab PT Goals Patient Stated Goal: to walk without pain PT Goal Formulation: With patient Time For Goal Achievement: 02/12/18 Potential to Achieve Goals: Good Progress towards PT goals: Progressing toward goals    Frequency    7X/week      PT Plan Current plan remains appropriate  Co-evaluation              AM-PAC PT "6 Clicks" Daily Activity  Outcome Measure  Difficulty turning over in bed (including adjusting bedclothes, sheets and blankets)?: A Little Difficulty moving from lying on back to sitting on the side of the bed? : A Little Difficulty sitting down on and standing up from a chair with arms (e.g., wheelchair, bedside commode, etc,.)?: A Little Help needed moving to and from a bed to chair (including a wheelchair)?: None Help needed walking in hospital room?: None Help needed climbing 3-5 steps with a railing? : A  Little 6 Click Score: 20    End of Session Equipment Utilized During Treatment: Gait belt Activity Tolerance: Patient limited by pain Patient left: with call bell/phone within reach;in bed Nurse Communication: Mobility status PT Visit Diagnosis: Difficulty in walking, not elsewhere classified (R26.2);Pain Pain - Right/Left: Left Pain - part of body: Knee     Time: 0922-0956 PT Time Calculation (min) (ACUTE ONLY): 34 min  Charges:  $Gait Training: 8-22 mins $Therapeutic Exercise: 8-22 mins                    Blondell Reveal Kistler PT 02/06/2018  Acute Rehabilitation Services Pager [phone] Office [phone]

## 2018-02-11 DIAGNOSIS — M25562 Pain in left knee: Secondary | ICD-10-CM | POA: Diagnosis not present

## 2018-02-12 NOTE — Discharge Summary (Signed)
Physician Discharge Summary  Patient ID: Brittany Mercer MRN: [id number] DOB/AGE: 71-Jun-1947 71 y.o.  Admit date: 02/05/2018 Discharge date: 02/06/2018   Procedures:  Procedure(s) (LRB): LEFT TOTAL KNEE ARTHROPLASTY (Left)  Attending Physician:  Dr. Durene Romans   Admission Diagnoses:   Left knee primary OA / pain  Discharge Diagnoses:  Principal Problem:   S/P left TKA Active Problems:   S/P knee replacement  Past Medical History:  Diagnosis Date  . Chronic narcotic use    takes Nucynta for neuropathic pain  . Headache   . Hereditary and idiopathic peripheral neuropathy    followed by pcp  . Hyperlipidemia   . Hypertension   . Hypokalemia   . OA (osteoarthritis)    LEFT KNEE  . Peripheral neuropathy   . Wears glasses     HPI:    Brittany Mercer, 72 y.o. female, has a history of pain and functional disability in the left knee due to arthritis and has failed non-surgical conservative treatments for greater than 12 weeks to include NSAID's and/or analgesics, corticosteriod injections and activity modification.  Onset of symptoms was gradual, starting 1+ years ago with gradually worsening course since that time. The patient noted prior procedures on the knee to include  arthroplasty on the right knee on 01/24/2018.  Patient currently rates pain in the left knee(s) at 8 out of 10 with activity. Patient has night pain, worsening of pain with activity and weight bearing, pain that interferes with activities of daily living, pain with passive range of motion, crepitus and joint swelling.  Patient has evidence of periarticular osteophytes and joint space narrowing by imaging studies.  There is no active infection.  Risks, benefits and expectations were discussed with the patient.  Risks including but not limited to the risk of anesthesia, blood clots, nerve damage, blood vessel damage, failure of the prosthesis, infection and up to and including death.  Patient understand the  risks, benefits and expectations and wishes to proceed with surgery.   PCP: Mechele Claude, MD   Discharged Condition: good  Hospital Course:  Patient underwent the above stated procedure on 02/05/2018. Patient tolerated the procedure well and brought to the recovery room in good condition and subsequently to the floor.  POD #1 BP: 116/61 ; Pulse: 58 ; Temp: 97.8 F (36.6 C) ; Resp: 16 Patient reports pain as mild, pain controlled.  No events throughout the night.  Ready to be discharged home. Dorsiflexion/plantar flexion intact, incision: dressing C/D/I, no cellulitis present and compartment soft.   LABS  Basename    HGB     11.3  HCT     32.7    Discharge Exam: General appearance: alert, cooperative and no distress Extremities: Homans sign is negative, no sign of DVT, no edema, redness or tenderness in the calves or thighs and no ulcers, gangrene or trophic changes  Disposition:  Home with follow up in 2 weeks   Follow-up Information    Lanney Gins, PA-C. Go on 02/20/2018.   Specialty:  Orthopedic Surgery Why:  You are scheduled for a post-op appointment with Freddie Breech, PA on 02-20-18 at 11:00 am [facility]. Contact information: [address] [place] [address] [place] [address] [phone]        Rikki Spearing. Go on 02/11/2018.   Why:  Your first physical therapy visit is scheduled with Lovie Macadamia at [facility] on 02-11-18 at 10:30 am.           Discharge Instructions    Call MD /  Call 911   Complete by:  As directed    If you experience chest pain or shortness of breath, CALL 911 and be transported to the hospital emergency room.  If you develope a fever above 101 F, pus (white drainage) or increased drainage or redness at the wound, or calf pain, call your surgeon's office.   Change dressing   Complete by:  As directed    Maintain surgical dressing until follow up in the clinic. If the edges start to pull up, may reinforce with tape. If  the dressing is no longer working, may remove and cover with gauze and tape, but must keep the area dry and clean.  Call with any questions or concerns.   Constipation Prevention   Complete by:  As directed    Drink plenty of fluids.  Prune juice may be helpful.  You may use a stool softener, such as Colace (over the counter) 100 mg twice a day.  Use MiraLax (over the counter) for constipation as needed.   Diet - low sodium heart healthy   Complete by:  As directed    Discharge instructions   Complete by:  As directed    Maintain surgical dressing until follow up in the clinic. If the edges start to pull up, may reinforce with tape. If the dressing is no longer working, may remove and cover with gauze and tape, but must keep the area dry and clean.  Follow up in 2 weeks at [facility]. Call with any questions or concerns.   Increase activity slowly as tolerated   Complete by:  As directed    Weight bearing as tolerated with assist device (walker, cane, etc) as directed, use it as long as suggested by your surgeon or therapist, typically at least 4-6 weeks.   TED hose   Complete by:  As directed    Use stockings (TED hose) for 2 weeks on both leg(s).  You may remove them at night for sleeping.      Allergies as of 02/06/2018      Reactions   Statins Other (See Comments)   Myalgia   Penicillins Rash, Other (See Comments)   Pt not sure if she is allergic to penicillin or sulfa. So she does not take either one.  Has patient had a PCN reaction causing immediate rash, facial/tongue/throat swelling, SOB or lightheadedness with hypotension: No Has patient had a PCN reaction causing severe rash involving mucus membranes or skin necrosis: Yes rash on arms.  Has patient had a PCN reaction that required hospitalization No Has patient had a PCN reaction occurring within the last 10 years:No If all of the above answers are "NO",   Sulfa Antibiotics Rash, Other (See Comments)   Pt not sure  if she is allergic to sulfa or penicillin so she doesn't take either one.       Medication List    TAKE these medications   acetaminophen 500 MG tablet Commonly known as:  TYLENOL Take 2 tablets (1,000 mg total) by mouth every 8 (eight) hours.   aspirin 81 MG chewable tablet Chew 1 tablet (81 mg total) by mouth 2 (two) times daily. Take for 4 weeks, then resume regular dose.   docusate sodium 100 MG capsule Commonly known as:  COLACE Take 1 capsule (100 mg total) by mouth 2 (two) times daily.   fenofibrate 160 MG tablet TAKE 1 TABLET DAILY FOR CHOLESTEROL AND TRIGLYCERIDE   ferrous sulfate 325 (65 FE) MG tablet Take  1 tablet (325 mg total) by mouth 3 (three) times daily with meals.   loratadine 10 MG tablet Commonly known as:  CLARITIN Take 10 mg by mouth every evening.   methocarbamol 500 MG tablet Commonly known as:  ROBAXIN Take 1 tablet (500 mg total) by mouth every 6 (six) hours as needed for muscle spasms.   metoprolol succinate 50 MG 24 hr tablet Commonly known as:  TOPROL-XL TAKE (1) TABLET DAILY FOR HIGH BLOOD PRESSURE.   oxyCODONE 5 MG immediate release tablet Commonly known as:  Oxy IR/ROXICODONE Take 1-2 tablets (5-10 mg total) by mouth every 4 (four) hours as needed for moderate pain or severe pain.   polyethylene glycol packet Commonly known as:  MIRALAX / GLYCOLAX Take 17 g by mouth 2 (two) times daily.   potassium chloride 10 MEQ tablet Commonly known as:  K-DUR TAKE 2 TO 3 TABLETS DAILY AS DIRECTED   sodium chloride 5 % ophthalmic solution Commonly known as:  MURO 128 Place 1 drop into the right eye daily.   Tapentadol HCl 100 MG Tabs Take 1 tablet (100 mg total) by mouth 4 (four) times daily.   Tapentadol HCl 100 MG Tabs Take 1 tablet (100 mg total) by mouth 4 (four) times daily. Start taking on:  02/28/2018   Tapentadol HCl 100 MG Tabs Take 1 tablet (100 mg total) by mouth 4 (four) times daily. Start taking on:  03/31/2018              Discharge Care Instructions  (From admission, onward)         Start     Ordered   02/06/18 0000  Change dressing    Comments:  Maintain surgical dressing until follow up in the clinic. If the edges start to pull up, may reinforce with tape. If the dressing is no longer working, may remove and cover with gauze and tape, but must keep the area dry and clean.  Call with any questions or concerns.   02/06/18 0911           Signed: Anastasio Auerbach. Avelyn Touch   PA-C  02/12/2018, 1:10 PM

## 2018-02-13 DIAGNOSIS — M25562 Pain in left knee: Secondary | ICD-10-CM | POA: Diagnosis not present

## 2018-02-15 DIAGNOSIS — M25562 Pain in left knee: Secondary | ICD-10-CM | POA: Diagnosis not present

## 2018-02-19 DIAGNOSIS — M25562 Pain in left knee: Secondary | ICD-10-CM | POA: Diagnosis not present

## 2018-02-25 DIAGNOSIS — M25562 Pain in left knee: Secondary | ICD-10-CM | POA: Diagnosis not present

## 2018-02-27 DIAGNOSIS — M25562 Pain in left knee: Secondary | ICD-10-CM | POA: Diagnosis not present

## 2018-03-01 DIAGNOSIS — M25562 Pain in left knee: Secondary | ICD-10-CM | POA: Diagnosis not present

## 2018-03-06 DIAGNOSIS — M25562 Pain in left knee: Secondary | ICD-10-CM | POA: Diagnosis not present

## 2018-03-08 DIAGNOSIS — Z471 Aftercare following joint replacement surgery: Secondary | ICD-10-CM | POA: Diagnosis not present

## 2018-03-08 DIAGNOSIS — M25562 Pain in left knee: Secondary | ICD-10-CM | POA: Diagnosis not present

## 2018-03-08 DIAGNOSIS — Z96652 Presence of left artificial knee joint: Secondary | ICD-10-CM | POA: Diagnosis not present

## 2018-03-11 DIAGNOSIS — M25562 Pain in left knee: Secondary | ICD-10-CM | POA: Diagnosis not present

## 2018-03-13 DIAGNOSIS — M25562 Pain in left knee: Secondary | ICD-10-CM | POA: Diagnosis not present

## 2018-03-15 DIAGNOSIS — M25562 Pain in left knee: Secondary | ICD-10-CM | POA: Diagnosis not present

## 2018-03-22 DIAGNOSIS — Z96652 Presence of left artificial knee joint: Secondary | ICD-10-CM | POA: Diagnosis not present

## 2018-03-22 DIAGNOSIS — M24662 Ankylosis, left knee: Secondary | ICD-10-CM | POA: Diagnosis not present

## 2018-03-22 DIAGNOSIS — M25562 Pain in left knee: Secondary | ICD-10-CM | POA: Diagnosis not present

## 2018-03-22 DIAGNOSIS — Z471 Aftercare following joint replacement surgery: Secondary | ICD-10-CM | POA: Diagnosis not present

## 2018-04-02 ENCOUNTER — Other Ambulatory Visit: Payer: Self-pay

## 2018-04-02 ENCOUNTER — Encounter (HOSPITAL_COMMUNITY): Payer: Self-pay | Admitting: *Deleted

## 2018-04-02 NOTE — Progress Notes (Signed)
Need orders in epic surgery on 04/11/2018.

## 2018-04-11 ENCOUNTER — Encounter (HOSPITAL_COMMUNITY): Payer: Self-pay | Admitting: Anesthesiology

## 2018-04-11 ENCOUNTER — Ambulatory Visit (HOSPITAL_COMMUNITY): Payer: Medicare Other | Admitting: Anesthesiology

## 2018-04-11 ENCOUNTER — Encounter (HOSPITAL_COMMUNITY)
Admission: RE | Disposition: A | Discharge status: Home / Self Care | Payer: Self-pay | Source: Home / Self Care | Attending: Orthopedic Surgery

## 2018-04-11 ENCOUNTER — Ambulatory Visit (HOSPITAL_COMMUNITY)
Admission: RE | Admit: 2018-04-11 | Discharge: 2018-04-11 | Disposition: A | Discharge status: Home / Self Care | Payer: Medicare Other | Attending: Orthopedic Surgery | Admitting: Orthopedic Surgery

## 2018-04-11 ENCOUNTER — Other Ambulatory Visit: Payer: Self-pay

## 2018-04-11 DIAGNOSIS — G609 Hereditary and idiopathic neuropathy, unspecified: Secondary | ICD-10-CM

## 2018-04-11 DIAGNOSIS — G629 Polyneuropathy, unspecified: Secondary | ICD-10-CM | POA: Diagnosis not present

## 2018-04-11 DIAGNOSIS — Z87891 Personal history of nicotine dependence: Secondary | ICD-10-CM | POA: Insufficient documentation

## 2018-04-11 DIAGNOSIS — Z96653 Presence of artificial knee joint, bilateral: Secondary | ICD-10-CM | POA: Diagnosis not present

## 2018-04-11 DIAGNOSIS — M24662 Ankylosis, left knee: Secondary | ICD-10-CM | POA: Insufficient documentation

## 2018-04-11 DIAGNOSIS — I10 Essential (primary) hypertension: Secondary | ICD-10-CM | POA: Insufficient documentation

## 2018-04-11 DIAGNOSIS — Z79899 Other long term (current) drug therapy: Secondary | ICD-10-CM | POA: Insufficient documentation

## 2018-04-11 DIAGNOSIS — G8918 Other acute postprocedural pain: Secondary | ICD-10-CM | POA: Diagnosis not present

## 2018-04-11 DIAGNOSIS — M25662 Stiffness of left knee, not elsewhere classified: Secondary | ICD-10-CM | POA: Diagnosis not present

## 2018-04-11 DIAGNOSIS — Z96652 Presence of left artificial knee joint: Secondary | ICD-10-CM | POA: Diagnosis not present

## 2018-04-11 HISTORY — PX: KNEE CLOSED REDUCTION: SHX995

## 2018-04-11 SURGERY — MANIPULATION, KNEE, CLOSED
Anesthesia: General | Site: Knee | Laterality: Left

## 2018-04-11 MED ORDER — METHYLPREDNISOLONE ACETATE 40 MG/ML IJ SUSP
INTRAMUSCULAR | Status: AC
  Filled 2018-04-11: qty 1

## 2018-04-11 MED ORDER — LACTATED RINGERS IV SOLN
INTRAVENOUS | Status: DC
  Administered 2018-04-11: 11:00:00 via INTRAVENOUS

## 2018-04-11 MED ORDER — FENTANYL CITRATE (PF) 100 MCG/2ML IJ SOLN
INTRAMUSCULAR | Status: DC | PRN
  Administered 2018-04-11: 100 ug via INTRAVENOUS

## 2018-04-11 MED ORDER — BUPIVACAINE-EPINEPHRINE (PF) 0.5% -1:200000 IJ SOLN
INTRAMUSCULAR | Status: DC | PRN
  Administered 2018-04-11: 30 mL via PERINEURAL

## 2018-04-11 MED ORDER — HYDROMORPHONE HCL 1 MG/ML IJ SOLN
INTRAMUSCULAR | Status: AC
  Filled 2018-04-11: qty 2

## 2018-04-11 MED ORDER — METHOCARBAMOL 1000 MG/10ML IJ SOLN
750.0000 mg | Freq: Once | INTRAVENOUS | Status: AC
  Administered 2018-04-11: 750 mg via INTRAVENOUS
  Filled 2018-04-11: qty 7.5

## 2018-04-11 MED ORDER — BUPIVACAINE HCL (PF) 0.5 % IJ SOLN
INTRAMUSCULAR | Status: AC
  Filled 2018-04-11: qty 30

## 2018-04-11 MED ORDER — MIDAZOLAM HCL 2 MG/2ML IJ SOLN
INTRAMUSCULAR | Status: AC
  Filled 2018-04-11: qty 2

## 2018-04-11 MED ORDER — MEPERIDINE HCL 50 MG/ML IJ SOLN: 6.2500 mg | INTRAMUSCULAR | Status: DC | PRN

## 2018-04-11 MED ORDER — MIDAZOLAM HCL 2 MG/2ML IJ SOLN
1.0000 mg | INTRAMUSCULAR | Status: AC
  Administered 2018-04-11: 1 mg via INTRAVENOUS

## 2018-04-11 MED ORDER — FENTANYL CITRATE (PF) 100 MCG/2ML IJ SOLN
INTRAMUSCULAR | Status: AC
  Filled 2018-04-11: qty 2

## 2018-04-11 MED ORDER — ONDANSETRON HCL 4 MG/2ML IJ SOLN: 4.0000 mg | Freq: Once | INTRAMUSCULAR | Status: DC | PRN

## 2018-04-11 MED ORDER — HYDROMORPHONE HCL 2 MG PO TABS: 2.0000 mg | ORAL_TABLET | Freq: Four times a day (QID) | ORAL | 0 refills | Status: DC | PRN

## 2018-04-11 MED ORDER — LIDOCAINE HCL (CARDIAC) PF 100 MG/5ML IV SOSY
PREFILLED_SYRINGE | INTRAVENOUS | Status: DC | PRN
  Administered 2018-04-11: 100 mg via INTRAVENOUS

## 2018-04-11 MED ORDER — ONDANSETRON HCL 4 MG/2ML IJ SOLN
INTRAMUSCULAR | Status: DC | PRN
  Administered 2018-04-11: 4 mg via INTRAVENOUS

## 2018-04-11 MED ORDER — CYCLOBENZAPRINE HCL 10 MG PO TABS: 10.0000 mg | ORAL_TABLET | Freq: Three times a day (TID) | ORAL | 0 refills | Status: DC | PRN

## 2018-04-11 MED ORDER — PROPOFOL 10 MG/ML IV BOLUS
INTRAVENOUS | Status: AC
  Filled 2018-04-11: qty 20

## 2018-04-11 MED ORDER — PROPOFOL 10 MG/ML IV BOLUS
INTRAVENOUS | Status: DC | PRN
  Administered 2018-04-11: 200 mg via INTRAVENOUS
  Administered 2018-04-11: 70 mg via INTRAVENOUS

## 2018-04-11 MED ORDER — DEXAMETHASONE SODIUM PHOSPHATE 10 MG/ML IJ SOLN
10.0000 mg | Freq: Once | INTRAMUSCULAR | Status: AC
  Administered 2018-04-11: 10 mg via INTRAVENOUS

## 2018-04-11 MED ORDER — METHOCARBAMOL 500 MG PO TABS: 500.0000 mg | ORAL_TABLET | Freq: Four times a day (QID) | ORAL | 0 refills | Status: DC | PRN

## 2018-04-11 MED ORDER — FENTANYL CITRATE (PF) 100 MCG/2ML IJ SOLN
50.0000 ug | INTRAMUSCULAR | Status: AC
  Administered 2018-04-11: 100 ug via INTRAVENOUS

## 2018-04-11 MED ORDER — HYDROMORPHONE HCL 1 MG/ML IJ SOLN
0.2500 mg | INTRAMUSCULAR | Status: DC | PRN
  Administered 2018-04-11 (×4): 0.5 mg via INTRAVENOUS

## 2018-04-11 MED ORDER — SUCCINYLCHOLINE CHLORIDE 20 MG/ML IJ SOLN
INTRAMUSCULAR | Status: DC | PRN
  Administered 2018-04-11: 60 mg via INTRAVENOUS

## 2018-04-11 SURGICAL SUPPLY — 22 items
BANDAGE ADH SHEER 1  50/CT (GAUZE/BANDAGES/DRESSINGS) IMPLANT
BLADE SAW SGTL 11.0X1.19X90.0M (BLADE) IMPLANT
COVER SURGICAL LIGHT HANDLE (MISCELLANEOUS) ×1 IMPLANT
COVER WAND RF STERILE (DRAPES) IMPLANT
DURAPREP 26ML APPLICATOR (WOUND CARE) ×1 IMPLANT
GAUZE SPONGE 4X4 12PLY STRL (GAUZE/BANDAGES/DRESSINGS) IMPLANT
GLOVE BIOGEL M 7.0 STRL (GLOVE) IMPLANT
GLOVE BIOGEL PI IND STRL 7.5 (GLOVE) ×1 IMPLANT
GLOVE BIOGEL PI IND STRL 8.5 (GLOVE) ×1 IMPLANT
GLOVE BIOGEL PI INDICATOR 7.5 (GLOVE)
GLOVE BIOGEL PI INDICATOR 8.5 (GLOVE)
GLOVE ECLIPSE 8.0 STRL XLNG CF (GLOVE) IMPLANT
GLOVE ORTHO TXT STRL SZ7.5 (GLOVE) ×2 IMPLANT
GLOVE SURG ORTHO 8.0 STRL STRW (GLOVE) ×1 IMPLANT
GOWN STRL REUS W/TWL LRG LVL3 (GOWN DISPOSABLE) ×1 IMPLANT
GOWN STRL REUS W/TWL XL LVL3 (GOWN DISPOSABLE) ×2 IMPLANT
MANIFOLD NEPTUNE II (INSTRUMENTS) ×1 IMPLANT
NDL SAFETY ECLIPSE 18X1.5 (NEEDLE) IMPLANT
NEEDLE HYPO 18GX1.5 SHARP (NEEDLE)
PROTECTOR NERVE ULNAR (MISCELLANEOUS) ×1 IMPLANT
SYR CONTROL 10ML LL (SYRINGE) IMPLANT
TOWEL OR 17X26 10 PK STRL BLUE (TOWEL DISPOSABLE) ×2 IMPLANT

## 2018-04-11 NOTE — Anesthesia Procedure Notes (Signed)
Anesthesia Regional Block: Adductor canal block   Pre-Anesthetic Checklist: ,, timeout performed, Correct Patient, Correct Site, Correct Laterality, Correct Procedure, Correct Position, site marked, Risks and benefits discussed,  Surgical consent,  Pre-op evaluation,  At surgeon's request and post-op pain management  Laterality: Left  Prep: chloraprep       Needles:  Injection technique: Single-shot  Needle Type: Echogenic Stimulator Needle     Needle Length: 9cm  Needle Gauge: 21   Needle insertion depth: 7 cm   Additional Needles:   Procedures:,,,, ultrasound used (permanent image in chart),,,,  Narrative:  Start time: 04/11/2018 11:33 AM End time: 04/11/2018 11:38 AM Injection made incrementally with aspirations every 5 mL.  Performed by: Personally  Anesthesiologist: Josephine Igo, MD  Additional Notes: Timeout performed. Patient sedated. Relevant anatomy ID'd using [place]. Incremental 2-1ml injection of LA with frequent aspiration. Patient tolerated procedure well.        Left Adductor Canal Block

## 2018-04-11 NOTE — Anesthesia Postprocedure Evaluation (Addendum)
Anesthesia Post Note  Patient: Brittany Mercer  Procedure(s) Performed: CLOSED MANIPULATION KNEE (Left Knee)     Patient location during evaluation: PACU Anesthesia Type: General Level of consciousness: awake and alert and oriented Pain management: pain level controlled Vital Signs Assessment: post-procedure vital signs reviewed and stable Respiratory status: spontaneous breathing, nonlabored ventilation and respiratory function stable Cardiovascular status: blood pressure returned to baseline and stable Postop Assessment: no apparent nausea or vomiting Anesthetic complications: no    Last Vitals:  Vitals:   04/11/18 1345 04/11/18 1400  BP: (!) 161/64 (!) 155/71  Pulse: 66 68  Resp: 18 14  Temp:  36.7 C  SpO2: 100% 95%    Last Pain:  Vitals:   04/11/18 1400  TempSrc:   PainSc: 3                  Rhyatt Muska A.

## 2018-04-11 NOTE — Progress Notes (Signed)
PACU Nsg note:  Santiago Glad from [facility] is bringing a rolling walker to pt prior to d/c home.

## 2018-04-11 NOTE — Anesthesia Preprocedure Evaluation (Signed)
Anesthesia Evaluation  Patient identified by MRN, date of birth, ID band Patient awake    Reviewed: Allergy & Precautions, NPO status , Patient's Chart, lab work & pertinent test results  Airway Mallampati: III  TM Distance: >3 FB Neck ROM: Full    Dental no notable dental hx. (+) Teeth Intact   Pulmonary former smoker,    Pulmonary exam normal breath sounds clear to auscultation       Cardiovascular hypertension, Pt. on medications  Rhythm:Regular Rate:Normal     Neuro/Psych  Headaches, Hereditary peripheral neuropathy  Neuromuscular disease negative psych ROS   GI/Hepatic negative GI ROS, Neg liver ROS,   Endo/Other  Hyperlipidemia  Renal/GU negative Renal ROS  negative genitourinary   Musculoskeletal  (+) Arthritis , Osteoarthritis,  Left knee arthrofibrosis S/P Left TKR   Abdominal   Peds  Hematology  (+) anemia ,   Anesthesia Other Findings   Reproductive/Obstetrics                             Anesthesia Physical Anesthesia Plan  ASA: III  Anesthesia Plan: General   Post-op Pain Management:  Regional for Post-op pain   Induction: Intravenous  PONV Risk Score and Plan: 4 or greater and Ondansetron, Dexamethasone, Midazolam, Treatment may vary due to age or medical condition and Propofol infusion  Airway Management Planned: Mask  Additional Equipment:   Intra-op Plan:   Post-operative Plan:   Informed Consent: I have reviewed the patients History and Physical, chart, labs and discussed the procedure including the risks, benefits and alternatives for the proposed anesthesia with the patient or authorized representative who has indicated his/her understanding and acceptance.     Dental advisory given  Plan Discussed with: CRNA and Surgeon  Anesthesia Plan Comments:         Anesthesia Quick Evaluation

## 2018-04-11 NOTE — Interval H&P Note (Signed)
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 After consideration of risks, benefits and other options for treatment, the patient has consented to  Procedure(s): CLOSED MANIPULATION KNEE (Left) as a surgical intervention .  The patient's history has been reviewed, patient examined, no change in status, stable for surgery.  I have reviewed the patient's chart and labs.  Questions were answered to the patient's satisfaction.     Mauri Pole

## 2018-04-11 NOTE — H&P (Signed)
Brittany Mercer is an 73 y.o. female.    Chief Complaint: Tight left knee S/P TKA  Procedure:   Left TKA MUA  HPI: Pt is a 73 y.o. female complaining of left knee stiff/tightness s/p TKA. Pain had continued and she is unable to obtain more ROM while working with PT.   X-rays in the clinic show a left TKA in good position and alignment. Pt has tried various conservative treatments which have failed to alleviate their symptoms, including analgesic medications and formal PT. Various options are discussed with the patient. Risks, benefits and expectations were discussed with the patient. Patient understand the risks, benefits and expectations and wishes to proceed with surgery.    PCP: Claretta Fraise, MD  D/C Plans:       Home  Post-op Meds:       No Rx given   Tranexamic Acid:      To be given - IV   Decadron:      Is to be given  FYI:      ASA  On meds pre-op  DME:   Pt already has equipment  PT:   OPPT    PMH: Past Medical History:  Diagnosis Date  . Chronic narcotic use    takes Nucynta for neuropathic pain  . Headache   . Hereditary and idiopathic peripheral neuropathy    followed by pcp  . Hyperlipidemia   . Hypertension   . Hypokalemia   . OA (osteoarthritis)    LEFT KNEE  . Peripheral neuropathy   . Wears glasses     PSH: Past Surgical History:  Procedure Laterality Date  . ABDOMINAL HYSTERECTOMY  1980s   W/ UNILATERAL SALPINGOOPHORECTOMY  . CESAREAN SECTION  x2   last one 9  . KNEE ARTHROSCOPY Right x2  last one 2016  . SHOULDER OPEN ROTATOR CUFF REPAIR Left 1990s  . TONSILLECTOMY  age 38  . 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 TOTAL KNEE ARTHROPLASTY Left 02/05/2018   Procedure: LEFT TOTAL KNEE ARTHROPLASTY;  Surgeon: Paralee Cancel, MD;  Location: [facility];  Service: Orthopedics;  Laterality: Left;  70 mins     Social History:  reports that she quit smoking about 19 years ago. Her smoking use included cigarettes. She has a 15.00 pack-year smoking history. She has never used smokeless tobacco. She reports current alcohol use of about 4.0 standard drinks of alcohol per week. She reports that she does not use drugs.  Allergies:  Allergies  Allergen Reactions  . Statins Other (See Comments)    Myalgia, turns red  . Penicillins Rash and Other (See Comments)    Pt not sure if she is allergic to penicillin or sulfa. So she does not take either one.  DID THE REACTION INVOLVE: Swelling of the face/tongue/throat, SOB, or low BP? Unknown Sudden or severe rash/hives, skin peeling, or the inside of the mouth or nose? No Did it require medical treatment? No When did it last happen?childhood allergy If all above answers are "NO", may proceed with cephalosporin use.  . Sulfa Antibiotics Rash and Other (See Comments)    Pt not sure if she is allergic to sulfa or penicillin so she doesn't take either one.     Medications: Current Facility-Administered Medications  Medication Dose Route Frequency Provider Last Rate Last Dose  . cyanocobalamin ((VITAMIN B-12)) injection 1,000  mcg  1,000 mcg Intramuscular Q30 days Claretta Fraise, MD   1,000 mcg at 04/24/16 1243   Current Outpatient Medications  Medication Sig Dispense Refill  . fenofibrate 160 MG tablet TAKE 1 TABLET DAILY FOR CHOLESTEROL AND TRIGLYCERIDE (Patient taking differently: Take 160 mg by mouth daily at 2 PM. ) 90 tablet 1  . HYDROmorphone (DILAUDID) 2 MG tablet Take 4 mg by mouth 3 (three) times daily as needed for severe pain.    Marland Kitchen ibuprofen (ADVIL,MOTRIN) 200 MG tablet Take 600 mg by mouth daily as needed for moderate pain.    Marland Kitchen loratadine (CLARITIN) 10 MG tablet Take 10 mg by mouth daily at 2 PM.     . metoprolol succinate (TOPROL-XL) 50 MG 24 hr tablet TAKE (1) TABLET DAILY FOR HIGH BLOOD PRESSURE. (Patient taking differently: Take 50 mg  by mouth daily at 2 PM. ) 90 tablet 1  . potassium chloride (K-DUR) 10 MEQ tablet TAKE 2 TO 3 TABLETS DAILY AS DIRECTED (Patient taking differently: Take 20 mEq by mouth daily at 2 PM. ) 180 tablet 1  . sodium chloride (MURO 128) 5 % ophthalmic solution Place 1 drop into the right eye daily. (Patient taking differently: Place 1 drop into the right eye 3 (three) times daily. ) 15 mL 1  . Tapentadol HCl (NUCYNTA) 100 MG TABS Take 1 tablet (100 mg total) by mouth 4 (four) times daily. 120 tablet 0  . acetaminophen (TYLENOL) 500 MG tablet Take 2 tablets (1,000 mg total) by mouth every 8 (eight) hours. (Patient not taking: Reported on 04/08/2018) 30 tablet 0  . docusate sodium (COLACE) 100 MG capsule Take 1 capsule (100 mg total) by mouth 2 (two) times daily. (Patient not taking: Reported on 04/08/2018) 10 capsule 0  . ferrous sulfate (FERROUSUL) 325 (65 FE) MG tablet Take 1 tablet (325 mg total) by mouth 3 (three) times daily with meals. (Patient not taking: Reported on 04/08/2018)  3  . methocarbamol (ROBAXIN) 500 MG tablet Take 1 tablet (500 mg total) by mouth every 6 (six) hours as needed for muscle spasms. (Patient not taking: Reported on 04/08/2018) 40 tablet 0  . oxyCODONE (OXY IR/ROXICODONE) 5 MG immediate release tablet Take 1-2 tablets (5-10 mg total) by mouth every 4 (four) hours as needed for moderate pain or severe pain. (Patient not taking: Reported on 04/08/2018) 60 tablet 0  . polyethylene glycol (MIRALAX / GLYCOLAX) packet Take 17 g by mouth 2 (two) times daily. (Patient not taking: Reported on 04/08/2018) 14 each 0       Review of Systems  Constitutional: Negative.   HENT: Negative.   Eyes: Negative.   Respiratory: Negative.   Cardiovascular: Negative.   Gastrointestinal: Negative.   Genitourinary: Negative.   Musculoskeletal: Positive for joint pain.  Skin: Negative.   Neurological: Positive for headaches.  Endo/Heme/Allergies: Positive for environmental allergies.    Psychiatric/Behavioral: Negative.        Physical Exam  Constitutional: She is oriented to person, place, and time. She appears well-developed.  HENT:  Head: Normocephalic.  Eyes: Pupils are equal, round, and reactive to light.  Neck: Neck supple. No JVD present. No tracheal deviation present. No thyromegaly present.  Cardiovascular: Normal rate, regular rhythm and intact distal pulses.  Respiratory: Effort normal and breath sounds normal. No respiratory distress. She has no wheezes.  GI: Soft. There is no abdominal tenderness. There is no guarding.  Musculoskeletal:     Left knee: She exhibits decreased range of motion (10-80) and swelling.  She exhibits no ecchymosis, no laceration (healed previous incision) and no erythema. Tenderness found.  Lymphadenopathy:    She has no cervical adenopathy.  Neurological: She is alert and oriented to person, place, and time.  Skin: Skin is warm and dry.  Psychiatric: She has a normal mood and affect.       Assessment/Plan Assessment:   Tight left knee S/P TKA  Plan: Patient will undergo a left TKA MUA on 04/11/2018 per Dr. Alvan Dame at [facility]. Risks benefits and expectations were discussed with the patient. Patient understand risks, benefits and expectations and wishes to proceed.     West Pugh Matalynn Graff   PA-C  04/11/2018, 8:13 AM

## 2018-04-11 NOTE — Progress Notes (Signed)
AssistedDr. Foster with left, ultrasound guided, adductor canal block. Side rails up, monitors on throughout procedure. See vital signs in flow sheet. Tolerated Procedure well.  

## 2018-04-11 NOTE — Progress Notes (Signed)
Discontinued type and screen prior to closed knee manipulation as per Dr Alvan Dame.

## 2018-04-11 NOTE — Transfer of Care (Signed)
Immediate Anesthesia Transfer of Care Note  Patient: Brittany Mercer  Procedure(s) Performed: Procedure(s): CLOSED MANIPULATION KNEE (Left)  Patient Location: PACU  Anesthesia Type:General  Level of Consciousness:  sedated, patient cooperative and responds to stimulation  Airway & Oxygen Therapy:Patient Spontanous Breathing and Patient connected to face mask oxgen  Post-op Assessment:  Report given to PACU RN and Post -op Vital signs reviewed and stable  Post vital signs:  Reviewed and stable  Last Vitals:  Vitals:   04/11/18 1216 04/11/18 1307  BP:  (!) 176/84  Pulse: 69 84  Resp: 19 (!) 25  Temp:  37.1 C  SpO2: 46% 950%    Complications: No apparent anesthesia complications

## 2018-04-11 NOTE — Op Note (Signed)
NAMEBONNY, VANLEEUWEN MEDICAL RECORD EH:[id number] ACCOUNT [id number] DATE OF BIRTH:Mar 12, 1946 FACILITY: [facility] LOCATION: [facility] PHYSICIAN:Alice Vitelli DAlvan Dame, MD  OPERATIVE REPORT  DATE OF PROCEDURE:  04/11/2018  PREOPERATIVE DIAGNOSIS:  Status post left total knee replacement with postoperative pain and stiffness, arthrofibrosis.  POSTOPERATIVE DIAGNOSIS:  Status post left total knee replacement with postoperative pain and stiffness, arthrofibrosis.  FINDINGS:  On the patient's preop pre-manipulation range of motion, she lacks close to 15 degrees of extension and flexed passively to 95 degrees.  PROCEDURE:  Manipulation of left knee under anesthesia.  SURGEON:  Paralee Cancel, MD  ANESTHESIA:  Preoperative adductor canal block plus general anesthesia.  SPECIMEN:  None.  COMPLICATIONS:  None apparent.  INDICATION FOR PROCEDURE:  The patient is a 73 year old female who is greater than 6 weeks out from her left total knee replacement.  It has been complicated by postoperative pain and challenges with physical therapy.  She has had her right knee  replaced.  However, her left knee had been recovery challenged.  She has been diligent with her physical therapy efforts.  We discussed the indications for manipulation.  We discussed the risks of persistent pain and stiffness.  We discussed the  postoperative course and expectations.  Consent was obtained for benefit of improved range of motion and pain.  PROCEDURE IN DETAIL:  The patient was brought to the operative theater.  Once adequate anesthesia was established, a timeout was performed identifying the patient, the planned procedure and extremity.  I first examined her knee under anesthesia and again  identified a range of motion about 15-95.  I first worked on extension by placing her heel onto my shoulder and applied a downward pressure across the proximal aspect of the knee.  I was able to lyse some adhesions and stretch her, getting  back to about  5 degrees.  I then took a break from this and worked on flexion.  After examining her knee in flexion with her hip flexed, proximal tibial pressure was applied.  I was able to lyse adhesions and get her flexion back, basically getting her calf to touch  her hamstring.  Her range of motion was seen to be close to 130 degrees at that point.  Given that finding, I worked on the extension for the remainder of the case, again placing her heel on my shoulder and applied downward pressure.  I was able to  gently manipulate some lysis of adhesions posteriorly.  With direct pressure, I was able to get her very close to 0.  Otherwise, she was about 5 degrees.  Once I had felt that I had manipulated enough without causing any issues, we concluded the  procedure.  I elected to place her in a knee immobilizer with the knee in extension until she awoke from anesthesia.  I stressed to her support that she needs to keep her knee as straight as much as possible or else she will be doomed with a flexion  contracture.  I am going to try to get physical therapy set up as soon as possible.  LN/NUANCE  D:04/11/2018 T:04/11/2018 JOB:[id number]

## 2018-04-11 NOTE — Progress Notes (Signed)
PACU Nsg Note:  Contacted Matt Babish PA to verify pt's post-op orders.  He was made aware that pt needs a walker to go home with d/t not being able to ambulate without it.

## 2018-04-11 NOTE — Brief Op Note (Signed)
04/11/2018  11:41 AM  PATIENT:  Brittany Mercer  73 y.o. 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   Delay start of Pharmacological VTE agent (>24hrs) due to surgical blood loss or risk of bleeding: not applicable

## 2018-04-12 ENCOUNTER — Encounter (HOSPITAL_COMMUNITY): Payer: Self-pay | Admitting: Orthopedic Surgery

## 2018-04-12 DIAGNOSIS — M25662 Stiffness of left knee, not elsewhere classified: Secondary | ICD-10-CM | POA: Diagnosis not present

## 2018-04-12 DIAGNOSIS — M25562 Pain in left knee: Secondary | ICD-10-CM | POA: Diagnosis not present

## 2018-04-15 DIAGNOSIS — M25562 Pain in left knee: Secondary | ICD-10-CM | POA: Diagnosis not present

## 2018-04-15 DIAGNOSIS — M25662 Stiffness of left knee, not elsewhere classified: Secondary | ICD-10-CM | POA: Diagnosis not present

## 2018-04-16 ENCOUNTER — Encounter: Payer: Self-pay | Admitting: Family Medicine

## 2018-04-16 ENCOUNTER — Ambulatory Visit (INDEPENDENT_AMBULATORY_CARE_PROVIDER_SITE_OTHER): Payer: Medicare Other | Admitting: Family Medicine

## 2018-04-16 VITALS — BP 127/72 | HR 76 | Temp 97.7°F | Ht 69.0 in | Wt 178.4 lb

## 2018-04-16 DIAGNOSIS — H60392 Other infective otitis externa, left ear: Secondary | ICD-10-CM

## 2018-04-16 DIAGNOSIS — M25662 Stiffness of left knee, not elsewhere classified: Secondary | ICD-10-CM | POA: Diagnosis not present

## 2018-04-16 DIAGNOSIS — M25562 Pain in left knee: Secondary | ICD-10-CM | POA: Diagnosis not present

## 2018-04-16 MED ORDER — CIPROFLOXACIN-DEXAMETHASONE 0.3-0.1 % OT SUSP: 4.0000 [drp] | Freq: Two times a day (BID) | OTIC | 0 refills | Status: DC

## 2018-04-16 NOTE — Progress Notes (Signed)
Chief Complaint  Patient presents with  . Ear Pain    pt here today c/o left ear ache    HPI  Patient presents today for  Left ear pain for 3-4 days. Hurts with chewing. Can't close her jaw in AM due to pain at angle of the mandible.   PMH: Smoking status noted ROS: Per HPI  Objective: BP 127/72   Pulse 76   Temp 97.7 F (36.5 C) (Oral)   Ht 5\' 9"  (1.753 m)   Wt 178 lb 6 oz (80.9 kg)   BMI 26.34 kg/m  Gen: NAD, alert, cooperative with exam HEENT: NCAT, EOMI, PERRL. Ear canal red CV: RRR, good S1/S2, no murmur Resp: CTABL, no wheezes, non-labored Abd: SNTND, BS present, no guarding or organomegaly Ext: No edema, warm Neuro: Alert and oriented, No gross deficits  Assessment and plan:  1. Other infective acute otitis externa of left ear     Meds ordered this encounter  Medications  . ciprofloxacin-dexamethasone (CIPRODEX) OTIC suspension    Sig: Place 4 drops into the left ear 2 (two) times daily.    Dispense:  7.5 mL    Refill:  0      Follow up as needed.  Claretta Fraise, MD

## 2018-04-16 NOTE — Patient Instructions (Signed)
Earwax removal:  Debrox drops are available without a prescription at your pharmacy.  Lay on your side with the ear up that you want to treat. Place for 5 drops of the Debrox in the ear canal and lay still for 15 minutes. After that time you can sit up and allow the excess to run out of the year and catch it with a Kleenex. Repeat this with the other ear if needed.  Repeat this process daily for 1 week. By that time the ear should feel less clogged and her hearing should be better, if not, follow up in the office for recheck of the ear.  Thanks, [employer]

## 2018-04-18 DIAGNOSIS — M25662 Stiffness of left knee, not elsewhere classified: Secondary | ICD-10-CM | POA: Diagnosis not present

## 2018-04-18 DIAGNOSIS — M25562 Pain in left knee: Secondary | ICD-10-CM | POA: Diagnosis not present

## 2018-04-19 DIAGNOSIS — M25562 Pain in left knee: Secondary | ICD-10-CM | POA: Diagnosis not present

## 2018-04-19 DIAGNOSIS — M25662 Stiffness of left knee, not elsewhere classified: Secondary | ICD-10-CM | POA: Diagnosis not present

## 2018-04-22 DIAGNOSIS — M25662 Stiffness of left knee, not elsewhere classified: Secondary | ICD-10-CM | POA: Diagnosis not present

## 2018-04-22 DIAGNOSIS — M25562 Pain in left knee: Secondary | ICD-10-CM | POA: Diagnosis not present

## 2018-04-23 DIAGNOSIS — M25662 Stiffness of left knee, not elsewhere classified: Secondary | ICD-10-CM | POA: Diagnosis not present

## 2018-04-23 DIAGNOSIS — M25562 Pain in left knee: Secondary | ICD-10-CM | POA: Diagnosis not present

## 2018-04-24 DIAGNOSIS — M25662 Stiffness of left knee, not elsewhere classified: Secondary | ICD-10-CM | POA: Diagnosis not present

## 2018-04-24 DIAGNOSIS — M25562 Pain in left knee: Secondary | ICD-10-CM | POA: Diagnosis not present

## 2018-04-25 DIAGNOSIS — M25562 Pain in left knee: Secondary | ICD-10-CM | POA: Diagnosis not present

## 2018-04-25 DIAGNOSIS — M25662 Stiffness of left knee, not elsewhere classified: Secondary | ICD-10-CM | POA: Diagnosis not present

## 2018-04-26 DIAGNOSIS — Z96652 Presence of left artificial knee joint: Secondary | ICD-10-CM | POA: Diagnosis not present

## 2018-04-26 DIAGNOSIS — Z471 Aftercare following joint replacement surgery: Secondary | ICD-10-CM | POA: Diagnosis not present

## 2018-04-29 DIAGNOSIS — M25662 Stiffness of left knee, not elsewhere classified: Secondary | ICD-10-CM | POA: Diagnosis not present

## 2018-04-29 DIAGNOSIS — M25562 Pain in left knee: Secondary | ICD-10-CM | POA: Diagnosis not present

## 2018-04-30 DIAGNOSIS — M25562 Pain in left knee: Secondary | ICD-10-CM | POA: Diagnosis not present

## 2018-04-30 DIAGNOSIS — M25662 Stiffness of left knee, not elsewhere classified: Secondary | ICD-10-CM | POA: Diagnosis not present

## 2018-05-01 ENCOUNTER — Ambulatory Visit: Payer: Medicare Other | Admitting: Family Medicine

## 2018-05-03 ENCOUNTER — Ambulatory Visit: Payer: Medicare Other | Admitting: Family Medicine

## 2018-05-03 ENCOUNTER — Encounter: Payer: Self-pay | Admitting: Family Medicine

## 2018-05-03 ENCOUNTER — Ambulatory Visit (INDEPENDENT_AMBULATORY_CARE_PROVIDER_SITE_OTHER): Payer: Medicare Other | Admitting: Family Medicine

## 2018-05-03 VITALS — BP 156/87 | HR 86 | Temp 98.5°F | Ht 69.0 in | Wt 174.4 lb

## 2018-05-03 DIAGNOSIS — G609 Hereditary and idiopathic neuropathy, unspecified: Secondary | ICD-10-CM | POA: Diagnosis not present

## 2018-05-03 DIAGNOSIS — R52 Pain, unspecified: Secondary | ICD-10-CM

## 2018-05-03 MED ORDER — TAPENTADOL HCL 100 MG PO TABS: 100.0000 mg | ORAL_TABLET | Freq: Four times a day (QID) | ORAL | 0 refills | Status: DC

## 2018-05-03 NOTE — Progress Notes (Signed)
Subjective:  Patient ID: Brittany Mercer, female    DOB: 1945-07-08  Age: 73 y.o. MRN: [id number]  CC: Medical Management of Chronic Issues   HPI Brittany Mercer presents for routine follow-up for her chronic pain management.  However, due to scheduling conflict with the office, she ran out of her medication 2 or 3 days ago.  She says her hands and her feet are on fire.  If she ever thought she could do without the medicine now she knows she cannot.  Pain is urged to 9/10 from a minimal pain with her medication.  Her morphine milligram equivalent is 160.  This is based on using Nucynta 100 mg 4 times daily.  Dose has been stable since before I started seeing her.  Review of PDMP shows that she did receive some Dilaudid from Dr. Franne Grip on the 31st and Dr. Guinevere Scarlet January 20 and Dr. Ihor Gully on January 9 in addition to the Nucynta prescribed by me.  This is based on total knee replacement on the left side performed on January 16.  Depression screen [facility] 2/9 05/03/2018 04/16/2018 01/30/2018  Decreased Interest 0 0 0  Down, Depressed, Hopeless 0 0 0  PHQ - 2 Score 0 0 0  Altered sleeping - - -  Tired, decreased energy - - -  Change in appetite - - -  Feeling bad or failure about yourself  - - -  Trouble concentrating - - -  Moving slowly or fidgety/restless - - -  Suicidal thoughts - - -  PHQ-9 Score - - -  Difficult doing work/chores - - -  Some recent data might be hidden    History Brittany Mercer has a past medical history of Chronic narcotic use, Headache, Hereditary and idiopathic peripheral neuropathy, Hyperlipidemia, Hypertension, Hypokalemia, OA (osteoarthritis), Peripheral neuropathy, and Wears glasses.   She has a past surgical history that includes Cesarean section (x2   last one 26); Total knee arthroplasty (Right, 01/25/2015); Abdominal hysterectomy (1980s); Knee arthroscopy (Right, x2  last one 2016); Shoulder open rotator cuff repair (Left, 1990s); Tonsillectomy (age 37); Total knee  arthroplasty (Left, 02/05/2018); and Knee Closed Reduction (Left, 04/11/2018).   Her family history includes Dementia in her father; Hyperlipidemia in her mother; Hypertension in her brother, father, and mother; Neuropathy in her father; Stroke in her mother.She reports that she quit smoking about 19 years ago. Her smoking use included cigarettes. She has a 15.00 pack-year smoking history. She has never used smokeless tobacco. She reports current alcohol use of about 4.0 standard drinks of alcohol per week. She reports that she does not use drugs.    ROS Review of Systems  Constitutional: Negative.   HENT: Negative.   Eyes: Negative for visual disturbance.  Respiratory: Negative for shortness of breath.   Cardiovascular: Negative for chest pain.  Gastrointestinal: Negative for abdominal pain.  Musculoskeletal: Negative for arthralgias.    Objective:  BP (!) 156/87   Pulse 86   Temp 98.5 F (36.9 C) (Oral)   Ht 5\' 9"  (1.753 m)   Wt 174 lb 6 oz (79.1 kg)   BMI 25.75 kg/m   BP Readings from Last 3 Encounters:  05/03/18 (!) 156/87  04/16/18 127/72  04/11/18 (!) 175/81    Wt Readings from Last 3 Encounters:  05/03/18 174 lb 6 oz (79.1 kg)  04/16/18 178 lb 6 oz (80.9 kg)  04/11/18 177 lb (80.3 kg)     Physical Exam Constitutional:      General: She is not in  acute distress.    Appearance: She is well-developed.  Cardiovascular:     Rate and Rhythm: Normal rate and regular rhythm.  Pulmonary:     Breath sounds: Normal breath sounds.  Skin:    General: Skin is warm and dry.  Neurological:     Mental Status: She is alert and oriented to person, place, and time.       Assessment & Plan:   Brittany Mercer was seen today for medical management of chronic issues.  Diagnoses and all orders for this visit:  Idiopathic peripheral neuropathy  Pain management  Other orders -     Tapentadol HCl (NUCYNTA) 100 MG TABS; Take 1 tablet (100 mg total) by mouth 4 (four) times  daily. -     Tapentadol HCl 100 MG TABS; Take 1 tablet (100 mg total) by mouth 4 (four) times daily for 30 days. -     Tapentadol HCl 100 MG TABS; Take 1 tablet (100 mg total) by mouth 4 (four) times daily for 30 days.       I have discontinued Brittany Hayward "Trish"'s HYDROmorphone, cyclobenzaprine, and ciprofloxacin-dexamethasone. I am also having her start on Tapentadol HCl and Tapentadol HCl. Additionally, I am having her maintain her loratadine, fenofibrate, metoprolol succinate, potassium chloride, sodium chloride, ibuprofen, and Tapentadol HCl. We will continue to administer cyanocobalamin.  Allergies as of 05/03/2018      Reactions   Statins Other (See Comments)   Myalgia, turns red   Penicillins Rash, Other (See Comments)   Pt not sure if she is allergic to penicillin or sulfa. So she does not take either one.  DID THE REACTION INVOLVE: Swelling of the face/tongue/throat, SOB, or low BP? Unknown Sudden or severe rash/hives, skin peeling, or the inside of the mouth or nose? No Did it require medical treatment? No When did it last happen?childhood allergy If all above answers are "NO", may proceed with cephalosporin use.   Sulfa Antibiotics Rash, Other (See Comments)   Pt not sure if she is allergic to sulfa or penicillin so she doesn't take either one.       Medication List       Accurate as of May 03, 2018 11:59 PM. Always use your most recent med list.        fenofibrate 160 MG tablet TAKE 1 TABLET DAILY FOR CHOLESTEROL AND TRIGLYCERIDE   ibuprofen 200 MG tablet Commonly known as:  ADVIL,MOTRIN Take 600 mg by mouth daily as needed for moderate pain.   loratadine 10 MG tablet Commonly known as:  CLARITIN Take 10 mg by mouth daily at 2 PM.   metoprolol succinate 50 MG 24 hr tablet Commonly known as:  TOPROL-XL TAKE (1) TABLET DAILY FOR HIGH BLOOD PRESSURE.   potassium chloride 10 MEQ tablet Commonly known as:  K-DUR TAKE 2 TO 3 TABLETS DAILY AS  DIRECTED   sodium chloride 5 % ophthalmic solution Commonly known as:  MURO 128 Place 1 drop into the right eye daily.   Tapentadol HCl 100 MG Tabs Commonly known as:  NUCYNTA Take 1 tablet (100 mg total) by mouth 4 (four) times daily.   Tapentadol HCl 100 MG Tabs Take 1 tablet (100 mg total) by mouth 4 (four) times daily for 30 days. Start taking on:  June 02, 2018   Tapentadol HCl 100 MG Tabs Take 1 tablet (100 mg total) by mouth 4 (four) times daily for 30 days. Start taking on:  July 02, 2018  Follow-up: Return in about 3 months (around 08/01/2018) for Pain.  Claretta Fraise, M.D.

## 2018-05-06 DIAGNOSIS — M25662 Stiffness of left knee, not elsewhere classified: Secondary | ICD-10-CM | POA: Diagnosis not present

## 2018-05-06 DIAGNOSIS — M25562 Pain in left knee: Secondary | ICD-10-CM | POA: Diagnosis not present

## 2018-05-07 ENCOUNTER — Encounter: Payer: Self-pay | Admitting: Family Medicine

## 2018-05-07 DIAGNOSIS — M25562 Pain in left knee: Secondary | ICD-10-CM | POA: Diagnosis not present

## 2018-05-07 DIAGNOSIS — M25662 Stiffness of left knee, not elsewhere classified: Secondary | ICD-10-CM | POA: Diagnosis not present

## 2018-05-10 DIAGNOSIS — M25562 Pain in left knee: Secondary | ICD-10-CM | POA: Diagnosis not present

## 2018-05-10 DIAGNOSIS — M25662 Stiffness of left knee, not elsewhere classified: Secondary | ICD-10-CM | POA: Diagnosis not present

## 2018-05-13 DIAGNOSIS — M25662 Stiffness of left knee, not elsewhere classified: Secondary | ICD-10-CM | POA: Diagnosis not present

## 2018-05-13 DIAGNOSIS — M25562 Pain in left knee: Secondary | ICD-10-CM | POA: Diagnosis not present

## 2018-05-14 ENCOUNTER — Other Ambulatory Visit: Payer: Self-pay | Admitting: Family Medicine

## 2018-05-14 DIAGNOSIS — M25662 Stiffness of left knee, not elsewhere classified: Secondary | ICD-10-CM | POA: Diagnosis not present

## 2018-05-14 DIAGNOSIS — Z1231 Encounter for screening mammogram for malignant neoplasm of breast: Secondary | ICD-10-CM

## 2018-05-14 DIAGNOSIS — M25562 Pain in left knee: Secondary | ICD-10-CM | POA: Diagnosis not present

## 2018-05-20 DIAGNOSIS — M25662 Stiffness of left knee, not elsewhere classified: Secondary | ICD-10-CM | POA: Diagnosis not present

## 2018-05-20 DIAGNOSIS — M25562 Pain in left knee: Secondary | ICD-10-CM | POA: Diagnosis not present

## 2018-05-21 DIAGNOSIS — M25562 Pain in left knee: Secondary | ICD-10-CM | POA: Diagnosis not present

## 2018-05-21 DIAGNOSIS — M25662 Stiffness of left knee, not elsewhere classified: Secondary | ICD-10-CM | POA: Diagnosis not present

## 2018-05-22 ENCOUNTER — Telehealth: Payer: Self-pay | Admitting: Family Medicine

## 2018-05-23 DIAGNOSIS — M25562 Pain in left knee: Secondary | ICD-10-CM | POA: Diagnosis not present

## 2018-05-23 DIAGNOSIS — M25662 Stiffness of left knee, not elsewhere classified: Secondary | ICD-10-CM | POA: Diagnosis not present

## 2018-06-06 ENCOUNTER — Encounter: Payer: Self-pay | Admitting: *Deleted

## 2018-06-06 ENCOUNTER — Ambulatory Visit: Payer: Medicare Other

## 2018-06-06 ENCOUNTER — Ambulatory Visit (INDEPENDENT_AMBULATORY_CARE_PROVIDER_SITE_OTHER): Payer: Medicare Other | Admitting: *Deleted

## 2018-06-06 ENCOUNTER — Other Ambulatory Visit: Payer: Self-pay

## 2018-06-06 VITALS — BP 135/79 | HR 91 | Ht 69.0 in | Wt 179.0 lb

## 2018-06-06 DIAGNOSIS — Z1159 Encounter for screening for other viral diseases: Secondary | ICD-10-CM

## 2018-06-06 DIAGNOSIS — Z0000 Encounter for general adult medical examination without abnormal findings: Secondary | ICD-10-CM

## 2018-06-06 DIAGNOSIS — Z23 Encounter for immunization: Secondary | ICD-10-CM

## 2018-06-06 NOTE — Progress Notes (Signed)
Subjective:   Emmie Frakes is a 73 y.o. female who presents for a Initial Medicare Annual Wellness Visit.  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  She enjoys being involved in her church, reading, listening to music, and exercising.  Ms. Bias is was widowed 5 years ago.  She recently downsized from a house in [place], [place] to a small house in [place], [place].  She has 2 adult sons- one in [place] and one near [place], and 4 grandchildren.   Patient Care Team: Claretta Fraise, MD as PCP - General (Family Medicine) Paralee Cancel, MD as Consulting Physician (Orthopedic Surgery) Sandford Craze, MD as Referring Physician (Dermatology)  Hospitalizations, surgeries, and ER visits in previous 12 months She had a right total knee replacement 02/05/2018 and stayed in the hospital one night.  She also had dental surgery on 04/2018.  She reports no ER visits in the past year.   Review of Systems    Patient reports that her overall health is better compared to last year due to improved knee pain.  Cardiac Risk Factors include: advanced age (>31men, >25 women);dyslipidemia;hypertension   All other systems negative       Current Medications (verified) Outpatient Encounter Medications as of 06/06/2018  Medication Sig  . fenofibrate 160 MG tablet TAKE 1 TABLET DAILY FOR CHOLESTEROL AND TRIGLYCERIDE (Patient taking differently: Take 160 mg by mouth daily at 2 PM. )  . ibuprofen (ADVIL,MOTRIN) 200 MG tablet Take 600 mg by mouth daily as needed for moderate pain.  Marland Kitchen loratadine (CLARITIN) 10 MG tablet Take 10 mg by mouth daily at 2 PM.   . metoprolol succinate (TOPROL-XL) 50 MG 24 hr tablet TAKE (1) TABLET DAILY FOR HIGH BLOOD PRESSURE. (Patient taking differently: Take 50 mg by mouth daily at 2 PM. )  . potassium chloride (K-DUR) 10 MEQ tablet TAKE 2 TO 3 TABLETS DAILY AS  DIRECTED (Patient taking differently: Take 20 mEq by mouth daily at 2 PM. )  . sodium chloride (MURO 128) 5 % ophthalmic solution Place 1 drop into the right eye daily. (Patient taking differently: Place 1 drop into the right eye 3 (three) times daily. )  . Tapentadol HCl (NUCYNTA) 100 MG TABS Take 1 tablet (100 mg total) by mouth 4 (four) times daily.  Derrill Memo ON 07/02/2018] Tapentadol HCl 100 MG TABS Take 1 tablet (100 mg total) by mouth 4 (four) times daily for 30 days.  . Tapentadol HCl 100 MG TABS Take 1 tablet (100 mg total) by mouth 4 (four) times daily for 30 days.   Facility-Administered Encounter Medications as of 06/06/2018  Medication  . cyanocobalamin ((VITAMIN B-12)) injection 1,000 mcg    Allergies (verified) Statins; Penicillins; and Sulfa antibiotics   History: Past Medical History:  Diagnosis Date  . Chronic narcotic use    takes Nucynta for neuropathic pain  . Headache   . Hereditary and idiopathic peripheral neuropathy    followed by pcp  . Hyperlipidemia   . Hypertension   . Hypokalemia   . OA (osteoarthritis)    LEFT KNEE  . Peripheral neuropathy   . Wears glasses    Past Surgical History:  Procedure Laterality Date  . ABDOMINAL HYSTERECTOMY  1980s   W/ UNILATERAL SALPINGOOPHORECTOMY  . CESAREAN SECTION  x2   last one 34  . KNEE ARTHROSCOPY Right x2  last one 2016  .  KNEE CLOSED REDUCTION Left 04/11/2018   Procedure: CLOSED MANIPULATION KNEE;  Surgeon: Paralee Cancel, MD;  Location: [facility];  Service: Orthopedics;  Laterality: Left;  . SHOULDER OPEN ROTATOR CUFF REPAIR Left 1990s  . TONSILLECTOMY  age 87  . 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 TOTAL KNEE ARTHROPLASTY Left 02/05/2018   Procedure: LEFT TOTAL KNEE ARTHROPLASTY;  Surgeon: Paralee Cancel, MD;  Location: [facility];  Service: Orthopedics;  Laterality: Left;  71  mins   Family History  Problem Relation Age of Onset  . Hypertension Mother   . Hyperlipidemia Mother   . Stroke Mother   . Heart disease Mother   . Hypertension Father   . Dementia Father   . Neuropathy Father   . Hypertension Brother   . Arthritis Brother    Social History   Socioeconomic History  . Marital status: Widowed    Spouse name: Not on file  . Number of children: 2  . Years of education: college  . Highest education level: Not on file  Occupational History  . Occupation: Retired  . Occupation: Real [employer]  Social Needs  . Financial resource strain: Not hard at all  . Food insecurity:    Worry: Never true    Inability: Never true  . Transportation needs:    Medical: No    Non-medical: No  Tobacco Use  . Smoking status: Former Smoker    Packs/day: 0.50    Years: 30.00    Pack years: 15.00    Types: Cigarettes    Last attempt to quit: 07/18/1998    Years since quitting: 19.8  . Smokeless tobacco: Never Used  Substance and Sexual Activity  . Alcohol use: Yes    Alcohol/week: 4.0 standard drinks    Types: 4 Glasses of wine per week  . Drug use: Never  . Sexual activity: Not on file  Lifestyle  . Physical activity:    Days per week: 3 days    Minutes per session: 30 min  . Stress: Only a little  Relationships  . Social connections:    Talks on phone: More than three times a week    Gets together: More than three times a week    Attends religious service: More than 4 times per year    Active member of club or organization: No    Attends meetings of clubs or organizations: Never    Relationship status: Widowed  Other Topics Concern  . Not on file  Social History Narrative  . Not on file     Clinical Intake:     Pain Score: 0-No pain                  Activities of Daily Living In your present state of health, do you have any difficulty performing the following activities: 06/06/2018 04/11/2018  Hearing? Y N  Comment  Evaluated by ENT- needs hearing aids, have not gotten them yet -  Vision? N N  Comment - -  Difficulty concentrating or making decisions? N N  Walking or climbing stairs? N N  Dressing or bathing? N N  Doing errands, shopping? N -  Preparing Food and eating ? N -  Using the Toilet? N -  In the past six months, have you accidently leaked urine? N -  Do you have problems with loss of bowel  control? N -  Managing your Medications? N -  Managing your Finances? N -  Housekeeping or managing your Housekeeping? N -  Some recent data might be hidden     Exercise Current Exercise Habits: Home exercise routine, Type of exercise: Other - see comments;strength training/weights(recumbent bicycle ), Time (Minutes): 35, Frequency (Times/Week): 3, Weekly Exercise (Minutes/Week): 105, Intensity: Moderate, Exercise limited by: orthopedic condition(s)  Diet Consumes 3 meals a day and 0 snacks a day.  The patient feels that she mostly follow a Regular diet.  Diet History  Ms. Kotowski states she usually eats a boiled egg and oatmeal for breakfast.  Soup and salad for lunch, and cheese or peanut butter and crackers for supper.  Recommended diet of mostly non-starchy vegetables, fruits, lean proteins, and whole grains.  Patient states she has access to all the food she needs.    Depression Screen PHQ 2/9 Scores 06/06/2018 05/03/2018 04/16/2018 01/30/2018 10/31/2017 06/22/2017 12/12/2016  PHQ - 2 Score 0 0 0 0 0 0 0  PHQ- 9 Score - - - - - - -  Exception Documentation - - - - - - -     Fall Risk Fall Risk  06/06/2018 04/16/2018 01/30/2018 10/31/2017 06/22/2017  Falls in the past year? 1 0 1 No No  Number falls in past yr: 0 - 0 - -  Injury with Fall? 0 - 1 - -  Comment - - - - -  Risk Factor Category  - - - - -  Risk for fall due to : Impaired balance/gait - - - -  Follow up Falls prevention discussed;Education provided - - - -     Objective:    Today's Vitals   06/06/18 1527  BP: 135/79  Pulse: 91   Weight: 179 lb (81.2 kg)  Height: 5\' 9"  (1.753 m)  PainSc: 0-No pain   Body mass index is 26.43 kg/m.  Advanced Directives 06/06/2018 04/02/2018 02/05/2018 01/28/2018 05/14/2017 06/07/2015 02/04/2015  Does Patient Have a Medical Advance Directive? Yes Yes Yes Yes No Yes Yes  Type of Paramedic of [place];Living will - [place];Living will [place];Living will - - [address];Living will  Does patient want to make changes to medical advance directive? No - Patient declined - No - Patient declined - - - -  Copy of [place] in Chart? No - copy requested - No - copy requested No - copy requested - - Yes    Hearing/Vision  Patient states she has hearing deficits - she has been evaluated by ENT and was advised she needs hearing aids.  She has not gotten them yet.  Sees well with glasses.   Cognitive Function: MMSE - Mini Mental State Exam 06/06/2018  Orientation to time 5  Orientation to Place 5  Registration 3  Attention/ Calculation 5  Recall 3  Language- name 2 objects 2  Language- repeat 1  Language- follow 3 step command 3  Language- read & follow direction 1  Write a sentence 1  Copy design 1  Total score 30           Immunizations and Health Maintenance Immunization History  Administered Date(s) Administered  . Influenza, High Dose Seasonal PF 02/12/2016  . Influenza,inj,Quad PF,6+ Mos 02/17/2013, 12/31/2013, 12/21/2014  . Influenza,inj,quad, With Preservative 11/08/2017  . Influenza-Unspecified 01/15/2017, 11/08/2017  . Pneumococcal Polysaccharide-23 07/17/2012  . Tdap 05/14/2017  . Zoster 02/12/2014  . Zoster Recombinat (Shingrix) 06/06/2018  Health Maintenance Due  Topic Date Due  . Hepatitis C Screening  05-24-1945  . PAP SMEAR-Modifier  04/09/2015  . COLON CANCER SCREENING ANNUAL FOBT  06/15/2015   Hepatitis C Screening recommended at next visit with Dr.  Livia Snellen.  Order entered.  Recommend discussing if pap smear is needed with Dr. Livia Snellen.     Health Maintenance  Topic Date Due  . Hepatitis C Screening  08-Aug-1945  . PAP SMEAR-Modifier  04/09/2015  . COLON CANCER SCREENING ANNUAL FOBT  06/15/2015  . MAMMOGRAM  03/03/2019  . COLONOSCOPY  06/25/2020  . TETANUS/TDAP  05/15/2027  . INFLUENZA VACCINE  Completed  . DEXA SCAN  Completed  . PNA vac Low Risk Adult  Completed        Assessment:   This is a routine wellness examination for [place].    Plan:    Goals    . Patient Stated     Work with Physiological scientist on [employer] training and flexibility.        Health Maintenance & Additional Screening Recommendations  Advanced directives: has an advanced directive - a copy HAS NOT been provided.  Lung: Low Dose CT Chest recommended if Age 76-80 years, 30 pack-year currently smoking OR have quit w/in 15years. Patient does not qualify. Hepatitis C Screening recommended: yes  Today's Orders Orders Placed This Encounter  Procedures  . Varicella-zoster vaccine IM (Shingrix)  . Hepatitis C antibody    Standing Status:   Future    Standing Expiration Date:   06/06/2019    Keep f/u with Claretta Fraise, MD and any other specialty appointments you may have Continue current medications Move carefully to avoid falls. Use assistive devices like a cane or walker if needed. Aim for at least 150 minutes of moderate activity a week. This can be done with chair exercises if necessary. Read or work on puzzles daily Stay connected with friends and family  I have personally reviewed and noted the following in the patient's chart:   . Medical and social history . Use of alcohol, tobacco or illicit drugs  . Current medications and supplements . Functional ability and status . Nutritional status . Physical activity . Advanced directives . List of other physicians . Hospitalizations, surgeries, and ER visits in previous 12 months . Vitals  . Screenings to include cognitive, depression, and falls . Referrals and appointments  In addition, I have reviewed and discussed with patient certain preventive protocols, quality metrics, and best practice recommendations. A written personalized care plan for preventive services as well as general preventive health recommendations were provided to patient.     Nolberto Hanlon, RN  06/06/2018

## 2018-06-06 NOTE — Patient Instructions (Addendum)
Please work on your goal of working with a Physiological scientist on [employer] training and improving flexibility.   At your convenience, please bring a copy of your Advance Directives (Healthcare Power of Attorney and Living Will) to our office to be filed in your medical record.  You received your 1st Shingrix (Shingles) vaccine today, you will need the 2nd dose after 08/06/2018.   Please follow up with Dr. Livia Snellen as scheduled.   Thank you for coming in for your Annual Wellness Visit today!  Preventive Care 35 Years and Older, Female Preventive care refers to lifestyle choices and visits with your health care provider that can promote health and wellness. What does preventive care include?  A yearly physical exam. This is also called an annual well check.  Dental exams once or twice a year.  Routine eye exams. Ask your health care provider how often you should have your eyes checked.  Personal lifestyle choices, including: ? Daily care of your teeth and gums. ? Regular physical activity. ? Eating a healthy diet. ? Avoiding tobacco and drug use. ? Limiting alcohol use. ? Practicing safe sex. ? Taking low-dose aspirin every day. ? Taking vitamin and mineral supplements as recommended by your health care provider. What happens during an annual well check? The services and screenings done by your health care provider during your annual well check will depend on your age, overall health, lifestyle risk factors, and family history of disease. Counseling Your health care provider may ask you questions about your:  Alcohol use.  Tobacco use.  Drug use.  Emotional well-being.  Home and relationship well-being.  Sexual activity.  Eating habits.  History of falls.  Memory and ability to understand (cognition).  Work and work Statistician.  Reproductive health.  Screening You may have the following tests or measurements:  Height, weight, and BMI.  Blood pressure.  Lipid and  cholesterol levels. These may be checked every 5 years, or more frequently if you are over 77 years old.  Skin check.  Lung cancer screening. You may have this screening every year starting at age 72 if you have a 30-pack-year history of smoking and currently smoke or have quit within the past 15 years.  Colorectal cancer screening. All adults should have this screening starting at age 65 and continuing until age 23. You will have tests every 1-10 years, depending on your results and the type of screening test. People at increased risk should start screening at an earlier age. Screening tests may include: ? Guaiac-based fecal occult blood testing. ? Fecal immunochemical test (FIT). ? Stool DNA test. ? Virtual colonoscopy. ? Sigmoidoscopy. During this test, a flexible tube with a tiny camera (sigmoidoscope) is used to examine your rectum and lower colon. The sigmoidoscope is inserted through your anus into your rectum and lower colon. ? Colonoscopy. During this test, a long, thin, flexible tube with a tiny camera (colonoscope) is used to examine your entire colon and rectum.  Hepatitis C blood test.  Hepatitis B blood test.  Sexually transmitted disease (STD) testing.  Diabetes screening. This is done by checking your blood sugar (glucose) after you have not eaten for a while (fasting). You may have this done every 1-3 years.  Bone density scan. This is done to screen for osteoporosis. You may have this done starting at age 40.  Mammogram. This may be done every 1-2 years. Talk to your health care provider about how often you should have regular mammograms. Talk with your health  care provider about your test results, treatment options, and if necessary, the need for more tests. Vaccines Your health care provider may recommend certain vaccines, such as:  Influenza vaccine. This is recommended every year.  Tetanus, diphtheria, and acellular pertussis (Tdap, Td) vaccine. You may need a Td  booster every 10 years.  Varicella vaccine. You may need this if you have not been vaccinated.  Zoster vaccine. You may need this after age 41.  Measles, mumps, and rubella (MMR) vaccine. You may need at least one dose of MMR if you were born in 1957 or later. You may also need a second dose.  Pneumococcal 13-valent conjugate (PCV13) vaccine. One dose is recommended after age 96.  Pneumococcal polysaccharide (PPSV23) vaccine. One dose is recommended after age 51.  Meningococcal vaccine. You may need this if you have certain conditions.  Hepatitis A vaccine. You may need this if you have certain conditions or if you travel or work in places where you may be exposed to hepatitis A.  Hepatitis B vaccine. You may need this if you have certain conditions or if you travel or work in places where you may be exposed to hepatitis B.  Haemophilus influenzae type b (Hib) vaccine. You may need this if you have certain conditions. Talk to your health care provider about which screenings and vaccines you need and how often you need them. This information is not intended to replace advice given to you by your health care provider. Make sure you discuss any questions you have with your health care provider. Document Released: 04/09/2015 Document Revised: 05/03/2017 Document Reviewed: 01/12/2015 Elsevier Interactive Patient Education  2019 [place] Prevention in the Home, Adult Falls can cause injuries. They can happen to people of all ages. There are many things you can do to make your home safe and to help prevent falls. Ask for help when making these changes, if needed. What actions can I take to prevent falls? General Instructions  Use good lighting in all rooms. Replace any light bulbs that burn out.  Turn on the lights when you go into a dark area. Use night-lights.  Keep items that you use often in easy-to-reach places. Lower the shelves around your home if necessary.  Set up  your furniture so you have a clear path. Avoid moving your furniture around.  Do not have throw rugs and other things on the floor that can make you trip.  Avoid walking on wet floors.  If any of your floors are uneven, fix them.  Add color or contrast paint or tape to clearly mark and help you see: ? Any grab bars or handrails. ? First and last steps of stairways. ? Where the edge of each step is.  If you use a stepladder: ? Make sure that it is fully opened. Do not climb a closed stepladder. ? Make sure that both sides of the stepladder are locked into place. ? Ask someone to hold the stepladder for you while you use it.  If there are any pets around you, be aware of where they are. What can I do in the bathroom?      Keep the floor dry. Clean up any water that spills onto the floor as soon as it happens.  Remove soap buildup in the tub or shower regularly.  Use non-skid mats or decals on the floor of the tub or shower.  Attach bath mats securely with double-sided, non-slip rug tape.  If you need  to sit down in the shower, use a plastic, non-slip stool.  Install grab bars by the toilet and in the tub and shower. Do not use towel bars as grab bars. What can I do in the bedroom?  Make sure that you have a light by your bed that is easy to reach.  Do not use any sheets or blankets that are too big for your bed. They should not hang down onto the floor.  Have a firm chair that has side arms. You can use this for support while you get dressed. What can I do in the kitchen?  Clean up any spills right away.  If you need to reach something above you, use a strong step stool that has a grab bar.  Keep electrical cords out of the way.  Do not use floor polish or wax that makes floors slippery. If you must use wax, use non-skid floor wax. What can I do with my stairs?  Do not leave any items on the stairs.  Make sure that you have a light switch at the top of the stairs  and the bottom of the stairs. If you do not have them, ask someone to add them for you.  Make sure that there are handrails on both sides of the stairs, and use them. Fix handrails that are broken or loose. Make sure that handrails are as long as the stairways.  Install non-slip stair treads on all stairs in your home.  Avoid having throw rugs at the top or bottom of the stairs. If you do have throw rugs, attach them to the floor with carpet tape.  Choose a carpet that does not hide the edge of the steps on the stairway.  Check any carpeting to make sure that it is firmly attached to the stairs. Fix any carpet that is loose or worn. What can I do on the outside of my home?  Use bright outdoor lighting.  Regularly fix the edges of walkways and driveways and fix any cracks.  Remove anything that might make you trip as you walk through a door, such as a raised step or threshold.  Trim any bushes or trees on the path to your home.  Regularly check to see if handrails are loose or broken. Make sure that both sides of any steps have handrails.  Install guardrails along the edges of any raised decks and porches.  Clear walking paths of anything that might make someone trip, such as tools or rocks.  Have any leaves, snow, or ice cleared regularly.  Use sand or salt on walking paths during winter.  Clean up any spills in your garage right away. This includes grease or oil spills. What other actions can I take?  Wear shoes that: ? Have a low heel. Do not wear high heels. ? Have rubber bottoms. ? Are comfortable and fit you well. ? Are closed at the toe. Do not wear open-toe sandals.  Use tools that help you move around (mobility aids) if they are needed. These include: ? Canes. ? Walkers. ? Scooters. ? Crutches.  Review your medicines with your doctor. Some medicines can make you feel dizzy. This can increase your chance of falling. Ask your doctor what other things you can do to  help prevent falls. Where to find more information  Centers for Disease Control and Prevention, STEADI: https://garcia.biz/  [employer] on Aging: BrainJudge.co.uk Contact a doctor if:  You are afraid of falling at home.  You  feel weak, drowsy, or dizzy at home.  You fall at home. Summary  There are many simple things that you can do to make your home safe and to help prevent falls.  Ways to make your home safe include removing tripping hazards and installing grab bars in the bathroom.  Ask for help when making these changes in your home. This information is not intended to replace advice given to you by your health care provider. Make sure you discuss any questions you have with your health care provider. Document Released: 01/07/2009 Document Revised: 10/26/2016 Document Reviewed: 10/26/2016 Elsevier Interactive Patient Education  2019 [employer].

## 2018-06-07 ENCOUNTER — Ambulatory Visit: Payer: Medicare Other

## 2018-06-07 ENCOUNTER — Ambulatory Visit: Payer: Medicare Other | Admitting: Family Medicine

## 2018-06-10 ENCOUNTER — Ambulatory Visit: Payer: Medicare Other

## 2018-06-11 ENCOUNTER — Encounter: Payer: Self-pay | Admitting: Family Medicine

## 2018-06-12 ENCOUNTER — Ambulatory Visit (INDEPENDENT_AMBULATORY_CARE_PROVIDER_SITE_OTHER): Payer: Medicare Other | Admitting: Family Medicine

## 2018-06-12 ENCOUNTER — Other Ambulatory Visit: Payer: Self-pay

## 2018-06-12 ENCOUNTER — Encounter: Payer: Self-pay | Admitting: Family Medicine

## 2018-06-12 VITALS — BP 164/92 | HR 82 | Temp 99.4°F | Ht 69.0 in | Wt 176.2 lb

## 2018-06-12 DIAGNOSIS — J40 Bronchitis, not specified as acute or chronic: Secondary | ICD-10-CM | POA: Diagnosis not present

## 2018-06-12 DIAGNOSIS — J329 Chronic sinusitis, unspecified: Secondary | ICD-10-CM | POA: Diagnosis not present

## 2018-06-12 MED ORDER — GUAIFENESIN-CODEINE 100-10 MG/5ML PO SYRP: 5.0000 mL | ORAL_SOLUTION | ORAL | 0 refills | Status: DC | PRN

## 2018-06-12 MED ORDER — LEVOFLOXACIN 500 MG PO TABS: 500.0000 mg | ORAL_TABLET | Freq: Every day | ORAL | 0 refills | Status: DC

## 2018-06-12 NOTE — Progress Notes (Signed)
Chief Complaint  Patient presents with  . Cough    pt here today c/o productive cough for 2 weeks, denies fever, no SOB    HPI  Patient presents today for Patient presents with upper respiratory congestion. Rhinorrhea that is frequently purulent. There is moderate sore throat. Patient reports coughing frequently as well.  yellow sputum noted. There is no fever, chills or sweats. The patient denies being short of breath. Onset was 7 days ago. Gradually worsening. Tried OTCs without improvement.  PMH: Smoking status noted ROS: Per HPI  Objective: BP (!) 164/92   Pulse 82   Temp 99.4 F (37.4 C) (Oral)   Ht 5\' 9"  (1.753 m)   Wt 176 lb 4 oz (79.9 kg)   BMI 26.03 kg/m  Gen: NAD, alert, cooperative with exam HEENT: NCAT, Nasal passages swollen, red TMS RED CV: RRR, good S1/S2, no murmur Resp: Bronchitis changes with scattered wheezes, non-labored Ext: No edema, warm Neuro: Alert and oriented, No gross deficits  Assessment and plan:  1. Sinobronchitis     Meds ordered this encounter  Medications  . levofloxacin (LEVAQUIN) 500 MG tablet    Sig: Take 1 tablet (500 mg total) by mouth daily.    Dispense:  7 tablet    Refill:  0  . guaiFENesin-codeine (CHERATUSSIN AC) 100-10 MG/5ML syrup    Sig: Take 5 mLs by mouth every 4 (four) hours as needed for cough.    Dispense:  180 mL    Refill:  0    No orders of the defined types were placed in this encounter.   Follow up as needed.  Claretta Fraise, MD

## 2018-06-25 DIAGNOSIS — L821 Other seborrheic keratosis: Secondary | ICD-10-CM | POA: Diagnosis not present

## 2018-06-25 DIAGNOSIS — L570 Actinic keratosis: Secondary | ICD-10-CM | POA: Diagnosis not present

## 2018-06-25 DIAGNOSIS — Z85828 Personal history of other malignant neoplasm of skin: Secondary | ICD-10-CM | POA: Diagnosis not present

## 2018-07-04 ENCOUNTER — Ambulatory Visit: Payer: Medicare Other

## 2018-08-02 ENCOUNTER — Other Ambulatory Visit: Payer: Self-pay

## 2018-08-02 ENCOUNTER — Ambulatory Visit (INDEPENDENT_AMBULATORY_CARE_PROVIDER_SITE_OTHER): Payer: Medicare Other | Admitting: Family Medicine

## 2018-08-02 ENCOUNTER — Encounter: Payer: Self-pay | Admitting: Family Medicine

## 2018-08-02 DIAGNOSIS — E785 Hyperlipidemia, unspecified: Secondary | ICD-10-CM | POA: Diagnosis not present

## 2018-08-02 DIAGNOSIS — F1120 Opioid dependence, uncomplicated: Secondary | ICD-10-CM | POA: Insufficient documentation

## 2018-08-02 DIAGNOSIS — Z96651 Presence of right artificial knee joint: Secondary | ICD-10-CM

## 2018-08-02 DIAGNOSIS — I10 Essential (primary) hypertension: Secondary | ICD-10-CM | POA: Diagnosis not present

## 2018-08-02 DIAGNOSIS — G609 Hereditary and idiopathic neuropathy, unspecified: Secondary | ICD-10-CM | POA: Diagnosis not present

## 2018-08-02 MED ORDER — TAPENTADOL HCL 100 MG PO TABS: 100.0000 mg | ORAL_TABLET | Freq: Four times a day (QID) | ORAL | 0 refills | Status: DC

## 2018-08-02 MED ORDER — POTASSIUM CHLORIDE ER 10 MEQ PO TBCR: EXTENDED_RELEASE_TABLET | ORAL | 1 refills | Status: DC

## 2018-08-02 MED ORDER — CIPROFLOXACIN HCL 500 MG PO TABS: 500.0000 mg | ORAL_TABLET | Freq: Two times a day (BID) | ORAL | 0 refills | Status: DC

## 2018-08-02 NOTE — Progress Notes (Signed)
Subjective:    Patient ID: Brittany Mercer, female    DOB: 1946-01-28, 73 y.o.   MRN: [id number]   HPI: Brittany Mercer is a 73 y.o. female presenting for  pain follow up. Walking more. Feeling good. Legs getting stronger. No knee pain. Had replacement 02/05/18. Still has some swelling.   Neuropathy pain is 2/10 with med. Toes get stiff and a little heat. Some numbness in toes. Had a sensation in 5th toe with walking. Had to take shoe off due to pain. No blister but is red.   Patient in for follow-up of elevated cholesterol. Doing well without complaints on current medication. Denies side effects of statin including myalgia and arthralgia and nausea. Also in today for liver function testing. Currently no chest pain, shortness of breath or other cardiovascular related symptoms noted.  Follow-up of hypertension. Patient has no history of headache chest pain or shortness of breath or recent cough. Patient also denies symptoms of TIA such as numbness weakness lateralizing. Patient checks  blood pressure at home and has not had any elevated readings recently. Patient denies side effects from his medication. States taking it regularly.   Depression screen [facility] 2/9 06/06/2018 05/03/2018 04/16/2018 01/30/2018 10/31/2017  Decreased Interest 0 0 0 0 0  Down, Depressed, Hopeless 0 0 0 0 0  PHQ - 2 Score 0 0 0 0 0  Altered sleeping - - - - -  Tired, decreased energy - - - - -  Change in appetite - - - - -  Feeling bad or failure about yourself  - - - - -  Trouble concentrating - - - - -  Moving slowly or fidgety/restless - - - - -  Suicidal thoughts - - - - -  PHQ-9 Score - - - - -  Difficult doing work/chores - - - - -  Some recent data might be hidden     Relevant past medical, surgical, family and social history reviewed and updated as indicated.  Interim medical history since our last visit reviewed. Allergies and medications reviewed and updated.  ROS:  Review of Systems  Constitutional:  Negative.   HENT: Negative for congestion.   Eyes: Negative for visual disturbance.  Respiratory: Negative for shortness of breath.   Cardiovascular: Negative for chest pain.  Gastrointestinal: Negative for abdominal pain, constipation, diarrhea, nausea and vomiting.  Genitourinary: Negative for difficulty urinating.  Musculoskeletal: Negative for arthralgias and myalgias.  Neurological: Negative for headaches.  Psychiatric/Behavioral: Negative for sleep disturbance.     Social History   Tobacco Use  Smoking Status Former Smoker  . Packs/day: 0.50  . Years: 30.00  . Pack years: 15.00  . Types: Cigarettes  . Last attempt to quit: 07/18/1998  . Years since quitting: 20.0  Smokeless Tobacco Never Used       Objective:     Wt Readings from Last 3 Encounters:  06/12/18 176 lb 4 oz (79.9 kg)  06/06/18 179 lb (81.2 kg)  05/03/18 174 lb 6 oz (79.1 kg)     Exam deferred. Pt. Harboring due to COVID 19. Phone visit performed.   Assessment & Plan:   1. Essential hypertension, benign   2. Hyperlipidemia with target LDL less than 100   3. S/P right TKA   4. Idiopathic peripheral neuropathy   5. Uncomplicated opioid dependence ([place])     Meds ordered this encounter  Medications  . ciprofloxacin (CIPRO) 500 MG tablet    Sig: Take 1 tablet (500 mg total)  by mouth 2 (two) times daily.    Dispense:  10 tablet    Refill:  0  . Tapentadol HCl (NUCYNTA) 100 MG TABS    Sig: Take 1 tablet (100 mg total) by mouth 4 (four) times daily.    Dispense:  120 tablet    Refill:  0  . Tapentadol HCl 100 MG TABS    Sig: Take 1 tablet (100 mg total) by mouth 4 (four) times daily for 30 days.    Dispense:  120 tablet    Refill:  0  . Tapentadol HCl 100 MG TABS    Sig: Take 1 tablet (100 mg total) by mouth 4 (four) times daily for 30 days.    Dispense:  120 tablet    Refill:  0  . potassium chloride (K-DUR) 10 MEQ tablet    Sig: TAKE 2 TO 3 TABLETS DAILY AS DIRECTED    Dispense:  180 tablet     Refill:  1    No orders of the defined types were placed in this encounter.     Diagnoses and all orders for this visit:  Essential hypertension, benign  Hyperlipidemia with target LDL less than 100  S/P right TKA  Idiopathic peripheral neuropathy  Uncomplicated opioid dependence ([place])  Other orders -     ciprofloxacin (CIPRO) 500 MG tablet; Take 1 tablet (500 mg total) by mouth 2 (two) times daily. -     Tapentadol HCl (NUCYNTA) 100 MG TABS; Take 1 tablet (100 mg total) by mouth 4 (four) times daily. -     Tapentadol HCl 100 MG TABS; Take 1 tablet (100 mg total) by mouth 4 (four) times daily for 30 days. -     Tapentadol HCl 100 MG TABS; Take 1 tablet (100 mg total) by mouth 4 (four) times daily for 30 days. -     potassium chloride (K-DUR) 10 MEQ tablet; TAKE 2 TO 3 TABLETS DAILY AS DIRECTED    Virtual Visit via telephone Note  I discussed the limitations, risks, security and privacy concerns of performing an evaluation and management service by telephone and the availability of in person appointments. The patient was identified with two identifiers. Pt.expressed understanding and agreed to proceed. Pt. Is at home. Dr. Livia Snellen is in his office.  Follow Up Instructions:   I discussed the assessment and treatment plan with the patient. The patient was provided an opportunity to ask questions and all were answered. The patient agreed with the plan and demonstrated an understanding of the instructions.   The patient was advised to call back or seek an in-person evaluation if the symptoms worsen or if the condition fails to improve as anticipated.   Total minutes including chart review and phone contact time: 35   Follow up plan: Return in about 3 months (around 11/02/2018).  Claretta Fraise, MD [place]

## 2018-09-12 ENCOUNTER — Other Ambulatory Visit: Payer: Self-pay

## 2018-09-12 ENCOUNTER — Ambulatory Visit
Admission: RE | Admit: 2018-09-12 | Discharge: 2018-09-12 | Disposition: A | Discharge status: Home / Self Care | Payer: Medicare Other | Source: Ambulatory Visit | Attending: Family Medicine | Admitting: Family Medicine

## 2018-09-12 DIAGNOSIS — Z1231 Encounter for screening mammogram for malignant neoplasm of breast: Secondary | ICD-10-CM | POA: Diagnosis not present

## 2018-09-19 ENCOUNTER — Encounter: Payer: Self-pay | Admitting: Family Medicine

## 2018-09-26 ENCOUNTER — Other Ambulatory Visit: Payer: Self-pay | Admitting: Family Medicine

## 2018-10-29 ENCOUNTER — Encounter: Payer: Self-pay | Admitting: Family Medicine

## 2018-10-29 ENCOUNTER — Ambulatory Visit (INDEPENDENT_AMBULATORY_CARE_PROVIDER_SITE_OTHER): Payer: Medicare Other | Admitting: Family Medicine

## 2018-10-29 DIAGNOSIS — I10 Essential (primary) hypertension: Secondary | ICD-10-CM

## 2018-10-29 DIAGNOSIS — H2513 Age-related nuclear cataract, bilateral: Secondary | ICD-10-CM | POA: Diagnosis not present

## 2018-10-29 DIAGNOSIS — G609 Hereditary and idiopathic neuropathy, unspecified: Secondary | ICD-10-CM | POA: Diagnosis not present

## 2018-10-29 DIAGNOSIS — F1120 Opioid dependence, uncomplicated: Secondary | ICD-10-CM | POA: Diagnosis not present

## 2018-10-29 DIAGNOSIS — H1851 Endothelial corneal dystrophy: Secondary | ICD-10-CM | POA: Diagnosis not present

## 2018-10-29 DIAGNOSIS — E785 Hyperlipidemia, unspecified: Secondary | ICD-10-CM

## 2018-10-29 MED ORDER — TAPENTADOL HCL 100 MG PO TABS: 100.0000 mg | ORAL_TABLET | Freq: Four times a day (QID) | ORAL | 0 refills | Status: DC

## 2018-10-29 MED ORDER — NUCYNTA 100 MG PO TABS: 100.0000 mg | ORAL_TABLET | Freq: Four times a day (QID) | ORAL | 0 refills | Status: DC

## 2018-10-29 NOTE — Progress Notes (Signed)
Subjective:    Patient ID: Brittany Mercer, female    DOB: [birth year], 73 y.o.   MRN: [id number]   HPI: Brittany Mercer is a 73 y.o. female presenting for follow up of pain Pain assessment: Cause of pain- neuropathy Pain location- leg, foot Pain on scale of 1-10- 9 Frequency- later in the day, late afternoon What increases pain-with missing med What makes pain Better-nothing Effects on ADL - none as long as taking med Any change in general medical condition-none  Current opioids rx- nucynta 100 mg QID # meds rx- 3 mos Effectiveness of current meds-good Adverse reactions form pain meds-denies Morphine equivalent- 160  Pill count performed-No Last drug screen - 11/19 ( high risk q94m moderate risk q625mlow risk yearly ) Urine drug screen today- yes Was the NCLenaeviewed- yes  If yes were their any concerning findings? - No (recently got 8 tramadol from her dentist)     Pain contract signed on:    [facility] 06/06/2018 05/03/2018 04/16/2018 01/30/2018 10/31/2017  Decreased Interest 0 0 0 0 0  Down, Depressed, Hopeless 0 0 0 0 0  PHQ - 2 Score 0 0 0 0 0  Altered sleeping - - - - -  Tired, decreased energy - - - - -  Change in appetite - - - - -  Feeling bad or failure about yourself  - - - - -  Trouble concentrating - - - - -  Moving slowly or fidgety/restless - - - - -  Suicidal thoughts - - - - -  PHQ-9 Score - - - - -  Difficult doing work/chores - - - - -  Some recent data might be hidden     Relevant past medical, surgical, family and social history reviewed and updated as indicated.  Interim medical history since our last visit reviewed. Allergies and medications reviewed and updated.  ROS:  Review of Systems  Constitutional: Negative.   HENT: Negative for congestion.   Eyes: Negative for visual disturbance.  Respiratory: Negative for shortness of breath.   Cardiovascular: Negative for chest pain.  Gastrointestinal:  Negative for abdominal pain, constipation, diarrhea, nausea and vomiting.  Genitourinary: Negative for difficulty urinating.  Musculoskeletal: Negative for arthralgias and myalgias.  Neurological: Positive for numbness (with ext. burning). Negative for headaches.  Psychiatric/Behavioral: Negative for sleep disturbance.     Social History   Tobacco Use  Smoking Status Former Smoker  . Packs/day: 0.50  . Years: 30.00  . Pack years: 15.00  . Types: Cigarettes  . Quit date: 07/18/1998  . Years since quitting: 20.2  Smokeless Tobacco Never Used       Objective:     Wt Readings from Last 3 Encounters:  06/12/18 176 lb 4 oz (79.9 kg)  06/06/18 179 lb (81.2 kg)  05/03/18 174 lb 6 oz (79.1 kg)     Exam deferred. Pt. Harboring due to COVID 19. Phone visit performed.   Assessment & Plan:   1. Essential hypertension, benign   2. Hyperlipidemia with target LDL less than 100   3. Uncomplicated opioid dependence ([place]  4. Idiopathic peripheral neuropathy     Meds ordered this encounter  Medications  . Tapentadol HCl (NUCYNTA) 100 MG TABS    Sig: Take 1 tablet (100 mg total) by mouth 4 (four) times daily.    Dispense:  120 tablet    Refill:  0  . Tapentadol HCl 100 MG TABS    Sig:  Take 1 tablet (100 mg total) by mouth 4 (four) times daily.    Dispense:  120 tablet    Refill:  0  . Tapentadol HCl 100 MG TABS    Sig: Take 1 tablet (100 mg total) by mouth 4 (four) times daily.    Dispense:  120 tablet    Refill:  0    Orders Placed This Encounter  Procedures  . CBC with Differential/Platelet  . CMP14+EGFR    Order Specific Question:   Has the patient fasted?    Answer:   Yes  . Lipid panel    Order Specific Question:   Has the patient fasted?    Answer:   Yes  . ToxASSURE Select 13 (MW), Urine      Diagnoses and all orders for this visit:  Essential hypertension, benign -     CBC with Differential/Platelet -     CMP14+EGFR  Hyperlipidemia with target LDL less  than 100 -     CBC with Differential/Platelet -     CMP14+EGFR -     Lipid panel  Uncomplicated opioid dependence ([facility]) -     CBC with Differential/Platelet -     CMP14+EGFR -     ToxASSURE Select 13 (MW), Urine  Idiopathic peripheral neuropathy -     CBC with Differential/Platelet -     CMP14+EGFR -     ToxASSURE Select 13 (MW), Urine  Other orders -     Tapentadol HCl (NUCYNTA) 100 MG TABS; Take 1 tablet (100 mg total) by mouth 4 (four) times daily. -     Tapentadol HCl 100 MG TABS; Take 1 tablet (100 mg total) by mouth 4 (four) times daily. -     Tapentadol HCl 100 MG TABS; Take 1 tablet (100 mg total) by mouth 4 (four) times daily.    Virtual Visit via telephone Note  I discussed the limitations, risks, security and privacy concerns of performing an evaluation and management service by telephone and the availability of in person appointments. The patient was identified with two identifiers. Pt.expressed understanding and agreed to proceed. Pt. Is at home. Dr. Livia Snellen is in his office.  Follow Up Instructions:   I discussed the assessment and treatment plan with the patient. The patient was provided an opportunity to ask questions and all were answered. The patient agreed with the plan and demonstrated an understanding of the instructions.   The patient was advised to call back or seek an in-person evaluation if the symptoms worsen or if the condition fails to improve as anticipated.   Total minutes including chart review and phone contact time: 25   Follow up plan: Return in about 3 months (around 01/29/2019).  Claretta Fraise, MD [place]

## 2018-10-30 ENCOUNTER — Other Ambulatory Visit: Payer: Medicare Other

## 2018-10-30 ENCOUNTER — Other Ambulatory Visit: Payer: Self-pay

## 2018-10-30 DIAGNOSIS — Z1159 Encounter for screening for other viral diseases: Secondary | ICD-10-CM

## 2018-10-31 ENCOUNTER — Other Ambulatory Visit: Payer: Self-pay | Admitting: Family Medicine

## 2018-10-31 LAB — CMP14+EGFR
ALT: 15 IU/L (ref 0–32)
AST: 17 IU/L (ref 0–40)
Albumin/Globulin Ratio: 2.4 — ABNORMAL HIGH (ref 1.2–2.2)
Albumin: 4.6 g/dL (ref 3.7–4.7)
Alkaline Phosphatase: 58 IU/L (ref 39–117)
BUN/Creatinine Ratio: 23 (ref 12–28)
BUN: 24 mg/dL (ref 8–27)
Bilirubin Total: 0.7 mg/dL (ref 0.0–1.2)
CO2: 22 mmol/L (ref 20–29)
Calcium: 9.4 mg/dL (ref 8.7–10.3)
Chloride: 104 mmol/L (ref 96–106)
Creatinine, Ser: 1.06 mg/dL — ABNORMAL HIGH (ref 0.57–1.00)
GFR calc Af Amer: 61 mL/min/{1.73_m2} (ref >59)
GFR calc non Af Amer: 53 mL/min/{1.73_m2} — ABNORMAL LOW (ref >59)
Globulin, Total: 1.9 g/dL (ref 1.5–4.5)
Glucose: 85 mg/dL (ref 65–99)
Potassium: 4.7 mmol/L (ref 3.5–5.2)
Sodium: 143 mmol/L (ref 134–144)
Total Protein: 6.5 g/dL (ref 6.0–8.5)

## 2018-10-31 LAB — CBC WITH DIFFERENTIAL/PLATELET
Basophils Absolute: 0 10*3/uL (ref 0.0–0.2)
Basos: 1 %
EOS (ABSOLUTE): 0.2 10*3/uL (ref 0.0–0.4)
Eos: 3 %
Hematocrit: 44.1 % (ref 34.0–46.6)
Hemoglobin: 15.5 g/dL (ref 11.1–15.9)
Immature Grans (Abs): 0 10*3/uL (ref 0.0–0.1)
Immature Granulocytes: 0 %
Lymphocytes Absolute: 2 10*3/uL (ref 0.7–3.1)
Lymphs: 33 %
MCH: 31.2 pg (ref 26.6–33.0)
MCHC: 35.1 g/dL (ref 31.5–35.7)
MCV: 89 fL (ref 79–97)
Monocytes Absolute: 0.6 10*3/uL (ref 0.1–0.9)
Monocytes: 9 %
Neutrophils Absolute: 3.4 10*3/uL (ref 1.4–7.0)
Neutrophils: 54 %
Platelets: 192 10*3/uL (ref 150–450)
RBC: 4.97 x10E6/uL (ref 3.77–5.28)
RDW: 12.8 % (ref 11.7–15.4)
WBC: 6.3 10*3/uL (ref 3.4–10.8)

## 2018-10-31 LAB — HEPATITIS C ANTIBODY: Hep C Virus Ab: <0.1 s/co ratio (ref 0.0–0.9)

## 2018-10-31 LAB — LIPID PANEL
Chol/HDL Ratio: 4.3 ratio (ref 0.0–4.4)
Cholesterol, Total: 200 mg/dL — ABNORMAL HIGH (ref 100–199)
HDL: 46 mg/dL (ref >39)
LDL Calculated: 133 mg/dL — ABNORMAL HIGH (ref 0–99)
Triglycerides: 103 mg/dL (ref 0–149)
VLDL Cholesterol Cal: 21 mg/dL (ref 5–40)

## 2018-11-01 ENCOUNTER — Telehealth: Payer: Self-pay | Admitting: Family Medicine

## 2018-11-01 LAB — TOXASSURE SELECT 13 (MW), URINE

## 2018-11-03 ENCOUNTER — Encounter: Payer: Self-pay | Admitting: Family Medicine

## 2018-11-04 ENCOUNTER — Encounter: Payer: Self-pay | Admitting: Family Medicine

## 2018-11-04 ENCOUNTER — Telehealth: Payer: Self-pay | Admitting: *Deleted

## 2018-11-04 ENCOUNTER — Telehealth: Payer: Self-pay | Admitting: Family Medicine

## 2018-11-04 ENCOUNTER — Encounter: Payer: Self-pay | Admitting: *Deleted

## 2018-11-04 MED ORDER — NUCYNTA 100 MG PO TABS: 100.0000 mg | ORAL_TABLET | Freq: Four times a day (QID) | ORAL | 0 refills | Status: DC

## 2018-11-04 MED ORDER — TAPENTADOL HCL 100 MG PO TABS: 100.0000 mg | ORAL_TABLET | Freq: Four times a day (QID) | ORAL | 0 refills | Status: DC

## 2018-11-04 NOTE — Telephone Encounter (Signed)
[employer] rerouted to [employer]. WS

## 2018-11-04 NOTE — Telephone Encounter (Signed)
Diagnosis code given.

## 2018-11-04 NOTE — Telephone Encounter (Signed)
I sent in the requested prescription 

## 2018-11-04 NOTE — Telephone Encounter (Signed)
Pt aware by Mychart, refill sent

## 2018-11-05 NOTE — Telephone Encounter (Signed)
One call taken care of in different encounter.  This encounter will now be closed

## 2018-11-11 DIAGNOSIS — H25013 Cortical age-related cataract, bilateral: Secondary | ICD-10-CM | POA: Diagnosis not present

## 2018-11-11 DIAGNOSIS — H25043 Posterior subcapsular polar age-related cataract, bilateral: Secondary | ICD-10-CM | POA: Diagnosis not present

## 2018-11-11 DIAGNOSIS — H2513 Age-related nuclear cataract, bilateral: Secondary | ICD-10-CM | POA: Diagnosis not present

## 2018-11-11 DIAGNOSIS — H2511 Age-related nuclear cataract, right eye: Secondary | ICD-10-CM | POA: Diagnosis not present

## 2018-11-19 DIAGNOSIS — L570 Actinic keratosis: Secondary | ICD-10-CM | POA: Diagnosis not present

## 2018-11-27 ENCOUNTER — Other Ambulatory Visit: Payer: Self-pay | Admitting: Family Medicine

## 2018-12-04 ENCOUNTER — Telehealth: Payer: Self-pay

## 2018-12-04 NOTE — Telephone Encounter (Signed)
Prior Auth for Nucynta 100mg -APPROVED   Coverage Start Date:11/04/2018;Coverage End Date:12/04/2019;  Key: [id number] -   PA Case ID: [id number]

## 2018-12-24 DIAGNOSIS — H1851 Endothelial corneal dystrophy: Secondary | ICD-10-CM | POA: Diagnosis not present

## 2018-12-24 DIAGNOSIS — H2513 Age-related nuclear cataract, bilateral: Secondary | ICD-10-CM | POA: Diagnosis not present

## 2018-12-31 DIAGNOSIS — Z23 Encounter for immunization: Secondary | ICD-10-CM | POA: Diagnosis not present

## 2019-01-28 ENCOUNTER — Ambulatory Visit (INDEPENDENT_AMBULATORY_CARE_PROVIDER_SITE_OTHER): Payer: Medicare Other | Admitting: Family Medicine

## 2019-01-28 ENCOUNTER — Encounter: Payer: Self-pay | Admitting: Family Medicine

## 2019-01-28 DIAGNOSIS — G609 Hereditary and idiopathic neuropathy, unspecified: Secondary | ICD-10-CM | POA: Diagnosis not present

## 2019-01-28 MED ORDER — TAPENTADOL HCL 100 MG PO TABS: 100.0000 mg | ORAL_TABLET | Freq: Four times a day (QID) | ORAL | 0 refills | Status: DC

## 2019-01-28 MED ORDER — NUCYNTA 100 MG PO TABS: 100.0000 mg | ORAL_TABLET | Freq: Four times a day (QID) | ORAL | 0 refills | Status: DC

## 2019-01-28 NOTE — Progress Notes (Signed)
Subjective:    Patient ID: Brittany Mercer, female    DOB: 18-Feb-1946, 73 y.o.   MRN: [id number]   HPI: Brittany Mercer is a 73 y.o. female presenting for pain is stable. Occasionally a bad day. Med still working well in spite of duration of use. Source of pain, neuropathy of legs, feet. Bilateral. Some electrical shock pain from knee to feet. Burning is daily, BLE from knees down. Usually gets worse, stiff in the evenings. MME is 160 . PDMP shows no discrepancy from prescribed dosage.    Depression screen [facility] 2/9 06/06/2018 05/03/2018 04/16/2018 01/30/2018 10/31/2017  Decreased Interest 0 0 0 0 0  Down, Depressed, Hopeless 0 0 0 0 0  PHQ - 2 Score 0 0 0 0 0  Altered sleeping - - - - -  Tired, decreased energy - - - - -  Change in appetite - - - - -  Feeling bad or failure about yourself  - - - - -  Trouble concentrating - - - - -  Moving slowly or fidgety/restless - - - - -  Suicidal thoughts - - - - -  PHQ-9 Score - - - - -  Difficult doing work/chores - - - - -  Some recent data might be hidden     Relevant past medical, surgical, family and social history reviewed and updated as indicated.  Interim medical history since our last visit reviewed. Allergies and medications reviewed and updated.  ROS:  Review of Systems  Constitutional: Negative.   HENT: Negative.   Eyes: Negative for visual disturbance.  Respiratory: Negative for shortness of breath.   Cardiovascular: Negative for chest pain.  Gastrointestinal: Negative for abdominal pain.  Musculoskeletal: Negative for arthralgias.     Social History   Tobacco Use  Smoking Status Former Smoker  . Packs/day: 0.50  . Years: 30.00  . Pack years: 15.00  . Types: Cigarettes  . Quit date: 07/18/1998  . Years since quitting: 20.5  Smokeless Tobacco Never Used       Objective:     Wt Readings from Last 3 Encounters:  06/12/18 176 lb 4 oz (79.9 kg)  06/06/18 179 lb (81.2 kg)  05/03/18 174 lb 6 oz  (79.1 kg)     Exam deferred. Pt. Harboring due to COVID 19. Phone visit performed.   Assessment & Plan:   1. Idiopathic peripheral neuropathy     Meds ordered this encounter  Medications  . Tapentadol HCl (NUCYNTA) 100 MG TABS    Sig: Take 1 tablet (100 mg total) by mouth 4 (four) times daily.    Dispense:  120 tablet    Refill:  0  . Tapentadol HCl 100 MG TABS    Sig: Take 1 tablet (100 mg total) by mouth 4 (four) times daily.    Dispense:  120 tablet    Refill:  0  . Tapentadol HCl 100 MG TABS    Sig: Take 1 tablet (100 mg total) by mouth 4 (four) times daily.    Dispense:  120 tablet    Refill:  0    No orders of the defined types were placed in this encounter.     Diagnoses and all orders for this visit:  Idiopathic peripheral neuropathy  Other orders -     Tapentadol HCl (NUCYNTA) 100 MG TABS; Take 1 tablet (100 mg total) by mouth 4 (four) times daily. -     Tapentadol HCl 100 MG TABS; Take 1 tablet (  100 mg total) by mouth 4 (four) times daily. -     Tapentadol HCl 100 MG TABS; Take 1 tablet (100 mg total) by mouth 4 (four) times daily.    Virtual Visit via telephone Note  I discussed the limitations, risks, security and privacy concerns of performing an evaluation and management service by telephone and the availability of in person appointments. The patient was identified with two identifiers. Pt.expressed understanding and agreed to proceed. Pt. Is at home. Dr. Livia Snellen is in his office.  Follow Up Instructions:   I discussed the assessment and treatment plan with the patient. The patient was provided an opportunity to ask questions and all were answered. The patient agreed with the plan and demonstrated an understanding of the instructions.   The patient was advised to call back or seek an in-person evaluation if the symptoms worsen or if the condition fails to improve as anticipated.   Total minutes including chart review and phone contact time: 15    Follow up plan: Return in about 3 months (around 04/30/2019).  Brittany Fraise, MD [place]

## 2019-02-11 ENCOUNTER — Other Ambulatory Visit: Payer: Self-pay | Admitting: Family Medicine

## 2019-02-25 DIAGNOSIS — L570 Actinic keratosis: Secondary | ICD-10-CM | POA: Diagnosis not present

## 2019-04-15 ENCOUNTER — Other Ambulatory Visit: Payer: Self-pay | Admitting: Family Medicine

## 2019-04-16 DIAGNOSIS — H18519 Endothelial corneal dystrophy, unspecified eye: Secondary | ICD-10-CM | POA: Diagnosis not present

## 2019-04-16 DIAGNOSIS — H2513 Age-related nuclear cataract, bilateral: Secondary | ICD-10-CM | POA: Diagnosis not present

## 2019-04-21 ENCOUNTER — Encounter: Payer: Self-pay | Admitting: Family Medicine

## 2019-04-21 ENCOUNTER — Ambulatory Visit (INDEPENDENT_AMBULATORY_CARE_PROVIDER_SITE_OTHER): Payer: Medicare Other | Admitting: Family Medicine

## 2019-04-21 DIAGNOSIS — F1120 Opioid dependence, uncomplicated: Secondary | ICD-10-CM | POA: Diagnosis not present

## 2019-04-21 DIAGNOSIS — G609 Hereditary and idiopathic neuropathy, unspecified: Secondary | ICD-10-CM | POA: Diagnosis not present

## 2019-04-21 MED ORDER — NUCYNTA 100 MG PO TABS: 100.0000 mg | ORAL_TABLET | Freq: Four times a day (QID) | ORAL | 0 refills | Status: DC

## 2019-04-21 MED ORDER — TAPENTADOL HCL 100 MG PO TABS: 100.0000 mg | ORAL_TABLET | Freq: Four times a day (QID) | ORAL | 0 refills | Status: DC

## 2019-04-21 NOTE — Progress Notes (Signed)
Subjective:    Patient ID: Brittany Mercer, female    DOB: [birth year], 74 y.o.   MRN: [id number]   HPI: Brittany Mercer is a 74 y.o. female presenting for sometimes a little more burning in feet at night.  than in the past. Not a big problem at this time. Lyrica  tried 12 years ago. Had swelling in the feet and hands - lost grip strength. In the past even socks caused pain that was unbearable. Has no side effects of the nucynta. Very satisfied. MME - 160.   Having corneal transplant soon. Will require 5 days of laying flat on her back. Her son will help her with care and drvning her to and from the hospital daily during that time.    Depression screen [facility] 2/9 06/06/2018 05/03/2018 04/16/2018 01/30/2018 10/31/2017  Decreased Interest 0 0 0 0 0  Down, Depressed, Hopeless 0 0 0 0 0  PHQ - 2 Score 0 0 0 0 0  Altered sleeping - - - - -  Tired, decreased energy - - - - -  Change in appetite - - - - -  Feeling bad or failure about yourself  - - - - -  Trouble concentrating - - - - -  Moving slowly or fidgety/restless - - - - -  Suicidal thoughts - - - - -  PHQ-9 Score - - - - -  Difficult doing work/chores - - - - -  Some recent data might be hidden     Relevant past medical, surgical, family and social history reviewed and updated as indicated.  Interim medical history since our last visit reviewed. Allergies and medications reviewed and updated.  ROS:  Review of Systems  Constitutional: Negative.   HENT: Negative.   Eyes: Negative for visual disturbance.  Respiratory: Negative for shortness of breath.   Cardiovascular: Negative for chest pain.  Gastrointestinal: Negative for abdominal pain.  Musculoskeletal: Negative for arthralgias.     Social History   Tobacco Use  Smoking Status Former Smoker  . Packs/day: 0.50  . Years: 30.00  . Pack years: 15.00  . Types: Cigarettes  . Quit date: 07/18/1998  . Years since quitting: 20.7  Smokeless Tobacco Never Used         Objective:     Wt Readings from Last 3 Encounters:  06/12/18 176 lb 4 oz (79.9 kg)  06/06/18 179 lb (81.2 kg)  05/03/18 174 lb 6 oz (79.1 kg)     Exam deferred. Pt. Harboring due to COVID 19. Phone visit performed.   Assessment & Plan:   1. Idiopathic peripheral neuropathy   2. Uncomplicated opioid dependence ([place])     Meds ordered this encounter  Medications  . Tapentadol HCl (NUCYNTA) 100 MG TABS    Sig: Take 1 tablet (100 mg total) by mouth 4 (four) times daily.    Dispense:  120 tablet    Refill:  0  . Tapentadol HCl 100 MG TABS    Sig: Take 1 tablet (100 mg total) by mouth 4 (four) times daily.    Dispense:  120 tablet    Refill:  0  . Tapentadol HCl 100 MG TABS    Sig: Take 1 tablet (100 mg total) by mouth 4 (four) times daily.    Dispense:  120 tablet    Refill:  0    No orders of the defined types were placed in this encounter.     Diagnoses and all orders for  this visit:  Idiopathic peripheral neuropathy  Uncomplicated opioid dependence ([place])  Other orders -     Tapentadol HCl (NUCYNTA) 100 MG TABS; Take 1 tablet (100 mg total) by mouth 4 (four) times daily. -     Tapentadol HCl 100 MG TABS; Take 1 tablet (100 mg total) by mouth 4 (four) times daily. -     Tapentadol HCl 100 MG TABS; Take 1 tablet (100 mg total) by mouth 4 (four) times daily.   Patient has been treated for this idiopathic neuropathy for many years long before I met her.  She has been stable on her Nucynta dose since before I took over her care as well.  There has been no increase in dose during those years.  She does not feel it necessary to decrease data.  Even though there is a new symptom its not bad enough to need more medicine.  Any change in character of the neuropathy that led to the need for further pain relief would require neurology referral.  She says it is not bad enough to pursue at this time. Virtual Visit via telephone Note  I discussed the limitations, risks,  security and privacy concerns of performing an evaluation and management service by telephone and the availability of in person appointments. The patient was identified with two identifiers. Pt.expressed understanding and agreed to proceed. Pt. Is at home. Dr. Livia Snellen is in his office.  Follow Up Instructions:   I discussed the assessment and treatment plan with the patient. The patient was provided an opportunity to ask questions and all were answered. The patient agreed with the plan and demonstrated an understanding of the instructions.   The patient was advised to call back or seek an in-person evaluation if the symptoms worsen or if the condition fails to improve as anticipated.   Total minutes including chart review and phone contact time: 17   Follow up plan: Return in about 3 months (around 07/20/2019).  Brittany Fraise, MD [place]

## 2019-05-16 DIAGNOSIS — Z20828 Contact with and (suspected) exposure to other viral communicable diseases: Secondary | ICD-10-CM | POA: Diagnosis not present

## 2019-05-16 DIAGNOSIS — Z20822 Contact with and (suspected) exposure to covid-19: Secondary | ICD-10-CM | POA: Diagnosis not present

## 2019-05-19 ENCOUNTER — Encounter: Payer: Self-pay | Admitting: Family Medicine

## 2019-05-19 ENCOUNTER — Other Ambulatory Visit: Payer: Self-pay | Admitting: Family Medicine

## 2019-05-19 MED ORDER — TRAZODONE HCL 150 MG PO TABS: ORAL_TABLET | ORAL | 5 refills | Status: DC

## 2019-05-21 ENCOUNTER — Encounter: Payer: Self-pay | Admitting: Family Medicine

## 2019-05-21 ENCOUNTER — Other Ambulatory Visit: Payer: Self-pay | Admitting: Family Medicine

## 2019-05-21 DIAGNOSIS — Z01818 Encounter for other preprocedural examination: Secondary | ICD-10-CM | POA: Diagnosis not present

## 2019-05-21 DIAGNOSIS — H18519 Endothelial corneal dystrophy, unspecified eye: Secondary | ICD-10-CM | POA: Diagnosis not present

## 2019-05-21 DIAGNOSIS — H2513 Age-related nuclear cataract, bilateral: Secondary | ICD-10-CM | POA: Diagnosis not present

## 2019-05-21 MED ORDER — ZALEPLON 10 MG PO CAPS: 10.0000 mg | ORAL_CAPSULE | Freq: Every evening | ORAL | 0 refills | Status: DC | PRN

## 2019-05-22 ENCOUNTER — Encounter: Payer: Self-pay | Admitting: Family Medicine

## 2019-05-22 DIAGNOSIS — H18511 Endothelial corneal dystrophy, right eye: Secondary | ICD-10-CM | POA: Diagnosis not present

## 2019-05-22 DIAGNOSIS — H2511 Age-related nuclear cataract, right eye: Secondary | ICD-10-CM | POA: Diagnosis not present

## 2019-05-22 DIAGNOSIS — H2513 Age-related nuclear cataract, bilateral: Secondary | ICD-10-CM | POA: Diagnosis not present

## 2019-05-22 HISTORY — PX: CORNEAL TRANSPLANT: SHX108

## 2019-05-26 DIAGNOSIS — H2512 Age-related nuclear cataract, left eye: Secondary | ICD-10-CM | POA: Diagnosis not present

## 2019-05-26 DIAGNOSIS — H18519 Endothelial corneal dystrophy, unspecified eye: Secondary | ICD-10-CM | POA: Diagnosis not present

## 2019-05-26 DIAGNOSIS — Z48810 Encounter for surgical aftercare following surgery on the sense organs: Secondary | ICD-10-CM | POA: Diagnosis not present

## 2019-05-26 DIAGNOSIS — Z961 Presence of intraocular lens: Secondary | ICD-10-CM | POA: Diagnosis not present

## 2019-05-30 DIAGNOSIS — H18513 Endothelial corneal dystrophy, bilateral: Secondary | ICD-10-CM | POA: Diagnosis not present

## 2019-05-30 DIAGNOSIS — Z48810 Encounter for surgical aftercare following surgery on the sense organs: Secondary | ICD-10-CM | POA: Diagnosis not present

## 2019-05-30 DIAGNOSIS — Z961 Presence of intraocular lens: Secondary | ICD-10-CM | POA: Diagnosis not present

## 2019-05-30 DIAGNOSIS — H2512 Age-related nuclear cataract, left eye: Secondary | ICD-10-CM | POA: Diagnosis not present

## 2019-05-31 ENCOUNTER — Ambulatory Visit: Payer: Medicare Other | Attending: Internal Medicine

## 2019-06-09 ENCOUNTER — Ambulatory Visit (INDEPENDENT_AMBULATORY_CARE_PROVIDER_SITE_OTHER): Payer: Medicare Other | Admitting: *Deleted

## 2019-06-09 DIAGNOSIS — Z0000 Encounter for general adult medical examination without abnormal findings: Secondary | ICD-10-CM

## 2019-06-09 NOTE — Progress Notes (Signed)
MEDICARE ANNUAL WELLNESS VISIT  06/09/2019  Telephone Visit Disclaimer This Medicare AWV was conducted by telephone due to national recommendations for restrictions regarding the COVID-19 Pandemic (e.g. social distancing).  I verified, using two identifiers, that I am speaking with Brittany Mercer or their authorized healthcare agent. I discussed the limitations, risks, security, and privacy concerns of performing an evaluation and management service by telephone and the potential availability of an in-person appointment in the future. The patient expressed understanding and agreed to proceed.   Subjective:  Brittany Mercer is a 74 y.o. female patient of Brittany Mercer, Brittany Gash, MD who had a Medicare Annual Wellness Visit today via telephone. Brittany Mercer is Retired and lives alone. Sge has 2 children. She reports that she is socially active and does interact with friends/family regularly. She is minimally physically active and enjoys cooking, reading, and working word puzzles.  Patient Care Team: Claretta Fraise, MD as PCP - General (Family Medicine) Paralee Cancel, MD as Consulting Physician (Orthopedic Surgery) Sandford Craze, MD as Referring Physician (Dermatology) Marilynne Halsted, MD Ophthalmology  Advanced Directives 06/09/2019 06/06/2018 04/02/2018 02/05/2018 01/28/2018 05/14/2017 06/07/2015  Does Patient Have a Medical Advance Directive? Yes Yes Yes Yes Yes No Yes  Type of Paramedic of [place];Living will [place];Living will - [place];Living will [place];Living will - -  Does patient want to make changes to medical advance directive? No - Patient declined No - Patient declined - No - Patient declined - - -  Copy of Silvana in Chart? No - copy requested No - copy requested - No - copy requested No - copy requested - [facility] Utilization Over the Past 12 Months: #  of hospitalizations or ER visits: 0 # of surgeries: 1  Review of Systems    Patient reports that her overall health is unchanged compared to last year.  History obtained from chart review and the patient ENT ROS: positive for - visual changes - Patient had corneal transplant and cataract surgery. Visual changes are improving. Patient states she has had some swelling around the eye and advised to call surgeon.  Patient Reported Readings (BP, Pulse, CBG, Weight, etc) none  Pain Assessment Pain : No/denies pain     Current Medications & Allergies (verified) Allergies as of 06/09/2019      Reactions   Statins Other (See Comments)   Myalgia, turns red   Penicillins Rash, Other (See Comments)   Pt not sure if she is allergic to penicillin or sulfa. So she does not take either one.  DID THE REACTION INVOLVE: Swelling of the face/tongue/throat, SOB, or low BP? Unknown Sudden or severe rash/hives, skin peeling, or the inside of the mouth or nose? No Did it require medical treatment? No When did it last happen?childhood allergy If all above answers are "NO", may proceed with cephalosporin use.   Sulfa Antibiotics Rash, Other (See Comments)   Pt not sure if she is allergic to sulfa or penicillin so she doesn't take either one.       Medication List       Accurate as of June 09, 2019  2:39 PM. If you have any questions, ask your nurse or doctor.        STOP taking these medications   sodium chloride 5 % ophthalmic solution Commonly known as: MURO 128   traZODone 150 MG tablet Commonly known as: DESYREL   zaleplon 10  MG capsule Commonly known as: SONATA     TAKE these medications   estradiol 0.1 MG/GM vaginal cream Commonly known as: ESTRACE PLACE 1 APPLICATORFUL VAGINALLY ONCE A WEEK. AT BED TIME   fenofibrate 160 MG tablet TAKE 1 TABLET BY MOUTH DAILY FOR CHOLESTEROL AND TRIGLYCERIDE   ibuprofen 200 MG tablet Commonly known as: ADVIL Take 600 mg by mouth daily  as needed for moderate pain.   loratadine 10 MG tablet Commonly known as: CLARITIN Take 10 mg by mouth daily at 2 PM.   metoprolol succinate 50 MG 24 hr tablet Commonly known as: TOPROL-XL TAKE (1) TABLET BY MOUTH DAILY FOR HIGH BLOOD PRESSURE.   Tapentadol HCl 100 MG Tabs Take 1 tablet (100 mg total) by mouth 4 (four) times daily.   Tapentadol HCl 100 MG Tabs Take 1 tablet (100 mg total) by mouth 4 (four) times daily.   Nucynta 100 MG Tabs Generic drug: Tapentadol HCl Take 1 tablet (100 mg total) by mouth 4 (four) times daily. Start taking on: June 27, 2019   potassium chloride 10 MEQ tablet Commonly known as: KLOR-CON TAKE 2 TO 3 TABLETS BY MOUTH DAILY AS DIRECTED   prednisoLONE acetate 1 % ophthalmic suspension Commonly known as: PRED FORTE Place 1 drop into the right eye 4 (four) times daily.       History (reviewed): Past Medical History:  Diagnosis Date  . Cataract   . Chronic narcotic use    takes Nucynta for neuropathic pain  . Headache   . Hereditary and idiopathic peripheral neuropathy    followed by pcp  . Hyperlipidemia   . Hypertension   . Hypokalemia   . OA (osteoarthritis)    LEFT KNEE  . Peripheral neuropathy   . Wears glasses    Past Surgical History:  Procedure Laterality Date  . ABDOMINAL HYSTERECTOMY  1980s   W/ UNILATERAL SALPINGOOPHORECTOMY  . CATARACT EXTRACTION Right   . CESAREAN SECTION  x2   last one 54  . 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 EYE SURGERY    . KNEE ARTHROSCOPY Right x2  last one 2016  . KNEE CLOSED REDUCTION Left 04/11/2018   Procedure: CLOSED MANIPULATION KNEE;  Surgeon: Paralee Cancel, MD;  Location: [facility];  Service: Orthopedics;  Laterality: Left;  . SHOULDER OPEN ROTATOR CUFF REPAIR Left 1990s  . TONSILLECTOMY  age 39  . 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 TOTAL  KNEE ARTHROPLASTY Left 02/05/2018   Procedure: LEFT TOTAL KNEE ARTHROPLASTY;  Surgeon: Paralee Cancel, MD;  Location: [facility];  Service: Orthopedics;  Laterality: Left;  70 mins   Family History  Problem Relation Age of Onset  . Hypertension Mother   . Hyperlipidemia Mother   . Stroke Mother   . Heart disease Mother   . Hypertension Father   . Dementia Father   . Neuropathy Father   . Hypertension Brother   . Arthritis Brother    Social History   Socioeconomic History  . Marital status: Widowed    Spouse name: Not on file  . Number of children: 2  . Years of education: college  . Highest education level: Bachelor's degree (e.g., BA, AB, BS)  Occupational History  . Occupation: Retired  . Occupation: Engineer, site  Tobacco Use  . Smoking status: Former Smoker    Packs/day: 0.50    Years: 30.00  Pack years: 15.00    Types: Cigarettes    Quit date: 07/18/1998    Years since quitting: 20.9  . Smokeless tobacco: Never Used  Substance and Sexual Activity  . Alcohol use: Yes    Alcohol/week: 4.0 standard drinks    Types: 4 Glasses of wine per week  . Drug use: Never  . Sexual activity: Not Currently    Birth control/protection: Surgical  Other Topics Concern  . Not on file  Social History Narrative   Lives alone. Has 2 adult sons and 4 grandchildren.   Social Determinants of Health   Financial Resource Strain:   . Difficulty of Paying Living Expenses:   Food Insecurity:   . Worried About Charity fundraiser in the Last Year:   . Arboriculturist in the Last Year:   Transportation Needs:   . Film/video editor (Medical):   Marland Kitchen Lack of Transportation (Non-Medical):   Physical Activity:   . Days of Exercise per Week:   . Minutes of Exercise per Session:   Stress:   . Feeling of Stress :   Social Connections:   . Frequency of Communication with Friends and Family:   . Frequency of Social Gatherings with Friends and Family:   . Attends Religious Services:     . Active Member of Clubs or Organizations:   . Attends Archivist Meetings:   Marland Kitchen Marital Status:     Activities of Daily Living In your present state of health, do you have any difficulty performing the following activities: 06/09/2019  Hearing? N  Vision? Y  Comment Patient just had cornea transplant. Wears glasses  Difficulty concentrating or making decisions? N  Walking or climbing stairs? N  Dressing or bathing? N  Doing errands, shopping? N  Preparing Food and eating ? N  Using the Toilet? N  In the past six months, have you accidently leaked urine? N  Do you have problems with loss of bowel control? N  Managing your Medications? N  Managing your Finances? N  Housekeeping or managing your Housekeeping? N  Some recent data might be hidden    Patient Education/ Literacy How often do you need to have someone help you when you read instructions, pamphlets, or other written materials from your doctor or pharmacy?: 1 - Never What is the last grade level you completed in school?: Bachelors Degree  Exercise Current Exercise Habits: Home exercise routine, Type of exercise: walking, Time (Minutes): 30, Frequency (Times/Week): 3, Weekly Exercise (Minutes/Week): 90, Intensity: Mild  Diet Patient reports consuming 3 meals a day and 1 snack(s) a day Patient reports that her primary diet is: Regular Patient reports that she does have regular access to food.   Depression Screen PHQ 2/9 Scores 06/09/2019 06/06/2018 05/03/2018 04/16/2018 01/30/2018 10/31/2017 06/22/2017  PHQ - 2 Score 0 0 0 0 0 0 0  PHQ- 9 Score - - - - - - -  Exception Documentation - - - - - - -     Fall Risk Fall Risk  06/09/2019 06/06/2018 04/16/2018 01/30/2018 10/31/2017  Falls in the past year? 0 1 0 1 No  Number falls in past yr: - 0 - 0 -  Injury with Fall? - 0 - 1 -  Comment - - - - -  Risk Factor Category  - - - - -  Risk for fall due to : - Impaired balance/gait - - -  Follow up - Falls prevention  discussed;Education provided - - -  Objective:  Brittany Mercer seemed alert and oriented and she participated appropriately during our telephone visit.  Blood Pressure Weight BMI  BP Readings from Last 3 Encounters:  06/12/18 (!) 164/92  06/06/18 135/79  05/03/18 (!) 156/87   Wt Readings from Last 3 Encounters:  06/12/18 176 lb 4 oz (79.9 kg)  06/06/18 179 lb (81.2 kg)  05/03/18 174 lb 6 oz (79.1 kg)   BMI Readings from Last 1 Encounters:  06/12/18 26.03 kg/m    *Unable to obtain current vital signs, weight, and BMI due to telephone visit type  Hearing/Vision  . Brittany Mercer did not seem to have difficulty with hearing/understanding during the telephone conversation . Reports that she has had a formal eye exam by an eye care professional within the past year . Reports that she has not had a formal hearing evaluation within the past year *Unable to fully assess hearing and vision during telephone visit type  Cognitive Function: 6CIT Screen 06/09/2019  What Year? 0 points  What month? 0 points  What time? 0 points  Count back from 20 0 points  Months in reverse 0 points  Repeat phrase 0 points  Total Score 0   (Normal:0-7, Significant for Dysfunction: >8)  Normal Cognitive Function Screening: Yes   Immunization & Health Maintenance Record Immunization History  Administered Date(s) Administered  . Influenza, High Dose Seasonal PF 02/12/2016, 12/31/2018  . Influenza,inj,Quad PF,6+ Mos 02/17/2013, 12/31/2013, 12/21/2014  . Influenza,inj,quad, With Preservative 11/08/2017  . Influenza-Unspecified 01/15/2017, 11/08/2017  . Pneumococcal Polysaccharide-23 07/17/2012  . Tdap 05/14/2017  . Zoster 02/12/2014  . Zoster Recombinat (Shingrix) 06/06/2018    Health Maintenance  Topic Date Due  . PAP SMEAR-Modifier  04/09/2015  . COLON CANCER SCREENING ANNUAL FOBT  06/15/2015  . COLONOSCOPY  06/25/2020  . MAMMOGRAM  09/11/2020  . TETANUS/TDAP  05/15/2027  .  INFLUENZA VACCINE  Completed  . DEXA SCAN  Completed  . Hepatitis C Screening  Completed  . PNA vac Low Risk Adult  Completed       Assessment  This is a routine wellness examination for [employer]  Health Maintenance: Due or Overdue Health Maintenance Due  Topic Date Due  . PAP SMEAR-Modifier  04/09/2015  . COLON CANCER SCREENING ANNUAL FOBT  06/15/2015    Brittany Mercer does not need a referral for Community Assistance: Care Management:   no Social Work:    no Prescription Assistance:  no Nutrition/Diabetes Education:  no   Plan:  Personalized Goals Goals Addressed            This Visit's Progress   . AWV       06/09/2019 AWV Goal: Fall Prevention  . Over the next year, patient will decrease their risk for falls by: o Using assistive devices, such as a cane or walker, as needed o Identifying fall risks within their home and correcting them by: - Removing throw rugs - Adding handrails to stairs or ramps - Removing clutter and keeping a clear pathway throughout the home - Increasing light, especially at night - Adding shower handles/bars - Raising toilet seat o Identifying potential personal risk factors for falls: - Medication side effects - Incontinence/urgency - Vestibular dysfunction - Hearing loss - Musculoskeletal disorders - Neurological disorders - Orthostatic hypotension        Personalized Health Maintenance & Screening Recommendations  Screening Pap smear and pelvic exam   Lung Cancer Screening Recommended: no (Low Dose CT Chest recommended if Age 38-80 years, 54  pack-year currently smoking OR have quit w/in past 15 years) Hepatitis C Screening recommended: no HIV Screening recommended: no  Advanced Directives: Written information was not prepared per patient's request.  Referrals & Orders No orders of the defined types were placed in this encounter.   Follow-up Plan . Follow-up with Claretta Fraise, MD as  planned . Schedule CPE with Pap .    I have personally reviewed and noted the following in the patient's chart:   . Medical and social history . Use of alcohol, tobacco or illicit drugs  . Current medications and supplements . Functional ability and status . Nutritional status . Physical activity . Advanced directives . List of other physicians . Hospitalizations, surgeries, and ER visits in previous 12 months . Vitals . Screenings to include cognitive, depression, and falls . Referrals and appointments  In addition, I have reviewed and discussed with Brittany Mercer certain preventive protocols, quality metrics, and best practice recommendations. A written personalized care plan for preventive services as well as general preventive health recommendations is available and can be mailed to the patient at her request.      Gareth Morgan  06/09/2019

## 2019-06-17 ENCOUNTER — Telehealth: Payer: Self-pay | Admitting: Family Medicine

## 2019-06-17 ENCOUNTER — Ambulatory Visit: Payer: Medicare Other | Admitting: Family Medicine

## 2019-06-17 NOTE — Chronic Care Management (AMB) (Signed)
  Chronic Care Management   Note  06/17/2019 Name: Lener Ventresca MRN: [id number] DOB: 02/27/1946  Elliot Cousin Housh is a 74 y.o. year old female who is a primary care patient of Stacks, Cletus Gash, MD. I reached out to [name] by phone today in response to a referral sent by Ms. Elliot Cousin Schwenke's health plan.     Ms. Samad was given information about Chronic Care Management services today including:  1. CCM service includes personalized support from designated clinical staff supervised by her physician, including individualized plan of care and coordination with other care providers 2. 24/7 contact phone numbers for assistance for urgent and routine care needs. 3. Service will only be billed when office clinical staff spend 20 minutes or more in a month to coordinate care. 4. Only one practitioner may furnish and bill the service in a calendar month. 5. The patient may stop CCM services at any time (effective at the end of the month) by phone call to the office staff. 6. The patient will be responsible for cost sharing (co-pay) of up to 20% of the service fee (after annual deductible is met).  Patient agreed to services and verbal consent obtained.   Follow up plan: Telephone appointment with care management team member scheduled for:12/11/2019.  [place], [place] [address] Direct Dial: [phone] [email or web address] Website: [email or web address]

## 2019-07-11 DIAGNOSIS — H2512 Age-related nuclear cataract, left eye: Secondary | ICD-10-CM | POA: Diagnosis not present

## 2019-07-11 DIAGNOSIS — Z961 Presence of intraocular lens: Secondary | ICD-10-CM | POA: Diagnosis not present

## 2019-07-11 DIAGNOSIS — H43811 Vitreous degeneration, right eye: Secondary | ICD-10-CM | POA: Diagnosis not present

## 2019-07-11 DIAGNOSIS — H18513 Endothelial corneal dystrophy, bilateral: Secondary | ICD-10-CM | POA: Diagnosis not present

## 2019-07-15 ENCOUNTER — Telehealth: Payer: Self-pay | Admitting: Family Medicine

## 2019-07-15 NOTE — Telephone Encounter (Signed)
Lmtcb.

## 2019-07-15 NOTE — Telephone Encounter (Signed)
Aware that if she needs controlled substances it may not be refilled. Patient is having trouble with vision waiting for her eye doctor does not fell comfortable to drive that far changed to televisit .

## 2019-07-17 ENCOUNTER — Ambulatory Visit (INDEPENDENT_AMBULATORY_CARE_PROVIDER_SITE_OTHER): Payer: Medicare Other | Admitting: Family Medicine

## 2019-07-17 ENCOUNTER — Other Ambulatory Visit: Payer: Self-pay

## 2019-07-17 DIAGNOSIS — G609 Hereditary and idiopathic neuropathy, unspecified: Secondary | ICD-10-CM

## 2019-07-17 MED ORDER — TAPENTADOL HCL 100 MG PO TABS: 100.0000 mg | ORAL_TABLET | Freq: Four times a day (QID) | ORAL | 0 refills | Status: DC

## 2019-07-17 MED ORDER — NUCYNTA 100 MG PO TABS: 100.0000 mg | ORAL_TABLET | Freq: Four times a day (QID) | ORAL | 0 refills | Status: DC

## 2019-07-17 NOTE — Progress Notes (Signed)
Subjective:  Patient ID: Brittany Mercer, female    DOB: [birth year]  Age: 74 y.o. MRN: [id number]  CC: No chief complaint on file.   HPI Brittany Mercer presents for pain control. Worse at night. Ramps up to 8/10. 1/10 in the morning.Stiff & burning.Taking nucenta every  6 hours. Relief only by using medication.  PDMP review shows no discrepancy of filling scrips. MME = 160. Stable now for over 2 years.  Depression screen [facility] 2/9 06/09/2019 06/06/2018 05/03/2018  Decreased Interest 0 0 0  Down, Depressed, Hopeless 0 0 0  PHQ - 2 Score 0 0 0  Altered sleeping - - -  Tired, decreased energy - - -  Change in appetite - - -  Feeling bad or failure about yourself  - - -  Trouble concentrating - - -  Moving slowly or fidgety/restless - - -  Suicidal thoughts - - -  PHQ-9 Score - - -  Difficult doing work/chores - - -  Some recent data might be hidden    History Brittany Mercer has a past medical history of Cataract, Chronic narcotic use, Headache, Hereditary and idiopathic peripheral neuropathy, Hyperlipidemia, Hypertension, Hypokalemia, OA (osteoarthritis), Peripheral neuropathy, and Wears glasses.   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   Her family history includes Arthritis in her brother; Dementia in her father; Heart disease in her mother; Hyperlipidemia in her mother; Hypertension in her brother, father, and mother; Neuropathy in her father; Stroke in her mother.She reports that she quit smoking about 21 years ago. Her smoking use included cigarettes. She has a 15.00  pack-year smoking history. She has never used smokeless tobacco. She reports current alcohol use of about 4.0 standard drinks of alcohol per week. She reports that she does not use drugs.    ROS Review of Systems  Constitutional: Negative.   HENT: Negative.   Eyes: Negative for visual disturbance.  Respiratory: Negative for shortness of breath.   Cardiovascular: Negative for chest pain.  Gastrointestinal: Negative for abdominal pain.  Musculoskeletal: Negative for arthralgias.    Objective:  There were no vitals taken for this visit.  BP Readings from Last 3 Encounters:  06/12/18 (!) 164/92  06/06/18 135/79  05/03/18 (!) 156/87    Wt Readings from Last 3 Encounters:  06/12/18 176 lb 4 oz (79.9 kg)  06/06/18 179 lb (81.2 kg)  05/03/18 174 lb 6 oz (79.1 kg)     Physical Exam  Exam deferred. Pt. Harboring due to COVID 19. Phone visit performed.   Assessment & Plan:   Diagnoses and all orders for this visit:  Idiopathic peripheral neuropathy  Other orders -     Tapentadol HCl (NUCYNTA) 100 MG TABS; Take 1 tablet (100 mg total) by mouth 4 (four) times daily. -     Tapentadol HCl 100 MG TABS; Take 1 tablet (100 mg total) by mouth 4 (four) times daily. -     Tapentadol HCl 100 MG TABS; Take 1 tablet (100 mg total) by mouth 4 (four) times daily.       I am having Brittany Mercer. [employer]" maintain her loratadine, ibuprofen, metoprolol succinate, estradiol, fenofibrate, potassium chloride, prednisoLONE acetate,  Nucynta, Tapentadol HCl, and Tapentadol HCl.  Allergies as of 07/17/2019      Reactions   Statins Other (See Comments)   Myalgia, turns red   Penicillins Rash, Other (See Comments)   Pt not sure if she is allergic to penicillin or sulfa. So she does not take either one.  DID THE REACTION INVOLVE: Swelling of the face/tongue/throat, SOB, or low BP? Unknown Sudden or severe rash/hives, skin peeling, or the inside of the mouth or nose? No Did it require  medical treatment? No When did it last happen?childhood allergy If all above answers are "NO", may proceed with cephalosporin use.   Sulfa Antibiotics Rash, Other (See Comments)   Pt not sure if she is allergic to sulfa or penicillin so she doesn't take either one.       Medication List       Accurate as of July 17, 2019 11:59 PM. If you have any questions, ask your nurse or doctor.        estradiol 0.1 MG/GM vaginal cream Commonly known as: ESTRACE PLACE 1 APPLICATORFUL VAGINALLY ONCE A WEEK. AT BED TIME   fenofibrate 160 MG tablet TAKE 1 TABLET BY MOUTH DAILY FOR CHOLESTEROL AND TRIGLYCERIDE   ibuprofen 200 MG tablet Commonly known as: ADVIL Take 600 mg by mouth daily as needed for moderate pain.   loratadine 10 MG tablet Commonly known as: CLARITIN Take 10 mg by mouth daily at 2 PM.   metoprolol succinate 50 MG 24 hr tablet Commonly known as: TOPROL-XL TAKE (1) TABLET BY MOUTH DAILY FOR HIGH BLOOD PRESSURE.   Tapentadol HCl 100 MG Tabs Take 1 tablet (100 mg total) by mouth 4 (four) times daily. Start taking on: Jul 27, 2019 What changed: These instructions start on Jul 27, 2019. If you are unsure what to do until then, ask your doctor or other care provider. Changed by: Claretta Fraise, MD   Tapentadol HCl 100 MG Tabs Take 1 tablet (100 mg total) by mouth 4 (four) times daily. Start taking on: August 26, 2019 What changed: These instructions start on August 26, 2019. If you are unsure what to do until then, ask your doctor or other care provider. Changed by: Claretta Fraise, MD   Nucynta 100 MG Tabs Generic drug: Tapentadol HCl Take 1 tablet (100 mg total) by mouth 4 (four) times daily. Start taking on: September 25, 2019 What changed: These instructions start on September 25, 2019. If you are unsure what to do until then, ask your doctor or other care provider. Changed by: Claretta Fraise, MD   potassium chloride 10 MEQ tablet Commonly known as: KLOR-CON TAKE 2 TO 3 TABLETS BY  MOUTH DAILY AS DIRECTED   prednisoLONE acetate 1 % ophthalmic suspension Commonly known as: PRED FORTE Place 1 drop into the right eye 4 (four) times daily.      Virtual Visit via telephone Note  I discussed the limitations, risks, security and privacy concerns of performing an evaluation and management service by telephone and the availability of in person appointments. I also discussed with the patient that there may be a patient responsible charge related to this service. The patient expressed understanding and agreed to proceed. Pt. Is at home. Dr. Livia Snellen is in his office.  Follow Up Instructions:   I discussed the assessment and treatment plan with the patient. The patient was provided an opportunity to ask questions and all were answered. The patient agreed with the plan and demonstrated an understanding of the instructions.  The patient was advised to call back or seek an in-person evaluation if the symptoms worsen or if the condition fails to improve as anticipated.  Total minutes including chart review and phone contact time: 18   Follow-up: Return in about 3 months (around 10/16/2019).  Claretta Fraise, M.D.

## 2019-07-20 ENCOUNTER — Encounter: Payer: Self-pay | Admitting: Family Medicine

## 2019-08-13 ENCOUNTER — Other Ambulatory Visit: Payer: Self-pay | Admitting: Family Medicine

## 2019-08-18 ENCOUNTER — Other Ambulatory Visit: Payer: Self-pay | Admitting: Family Medicine

## 2019-08-18 DIAGNOSIS — Z1231 Encounter for screening mammogram for malignant neoplasm of breast: Secondary | ICD-10-CM

## 2019-08-19 ENCOUNTER — Other Ambulatory Visit: Payer: Self-pay | Admitting: Family Medicine

## 2019-08-26 ENCOUNTER — Encounter: Payer: Self-pay | Admitting: Nurse Practitioner

## 2019-08-26 ENCOUNTER — Ambulatory Visit (INDEPENDENT_AMBULATORY_CARE_PROVIDER_SITE_OTHER): Payer: Medicare Other | Admitting: Nurse Practitioner

## 2019-08-26 ENCOUNTER — Other Ambulatory Visit: Payer: Self-pay

## 2019-08-26 VITALS — BP 141/90 | HR 73 | Temp 98.1°F | Ht 69.0 in | Wt 185.4 lb

## 2019-08-26 DIAGNOSIS — H6123 Impacted cerumen, bilateral: Secondary | ICD-10-CM | POA: Diagnosis not present

## 2019-08-26 NOTE — Assessment & Plan Note (Addendum)
Patient is a 74 year old presenting with Ear wax build up not well managed. patients ear flushed and patent. Education provided to keep ear canal patent by prophylactically using debrox to soften ear wax one a week  for easy removal.

## 2019-08-26 NOTE — Patient Instructions (Addendum)
Excessive cerumen in both ear canals Patient is a 74 year old presenting with Ear wax build up not well managed. patients ear flushed and patent. Education provided to keep ear canal patent by prophylactically using debrox to soften ear wax one a week  for easy removal.    Earwax Buildup, Adult The ears produce a substance called earwax that helps keep bacteria out of the ear and protects the skin in the ear canal. Occasionally, earwax can build up in the ear and cause discomfort or hearing loss. What increases the risk? This condition is more likely to develop in people who:  Are female.  Are elderly.  Naturally produce more earwax.  Clean their ears often with cotton swabs.  Use earplugs often.  Use in-ear headphones often.  Wear hearing aids.  Have narrow ear canals.  Have earwax that is overly thick or sticky.  Have eczema.  Are dehydrated.  Have excess hair in the ear canal. What are the signs or symptoms? Symptoms of this condition include:  Reduced or muffled hearing.  A feeling of fullness in the ear or feeling that the ear is plugged.  Fluid coming from the ear.  Ear pain.  Ear itch.  Ringing in the ear.  Coughing.  An obvious piece of earwax that can be seen inside the ear canal. How is this diagnosed? This condition may be diagnosed based on:  Your symptoms.  Your medical history.  An ear exam. During the exam, your health care provider will look into your ear with an instrument called an otoscope. You may have tests, including a hearing test. How is this treated? This condition may be treated by:  Using ear drops to soften the earwax.  Having the earwax removed by a health care provider. The health care provider may: ? Flush the ear with water. ? Use an instrument that has a loop on the end (curette). ? Use a suction device.  Surgery to remove the wax buildup. This may be done in severe cases. Follow these instructions at  home:   Take over-the-counter and prescription medicines only as told by your health care provider.  Do not put any objects, including cotton swabs, into your ear. You can clean the opening of your ear canal with a washcloth or facial tissue.  Follow instructions from your health care provider about cleaning your ears. Do not over-clean your ears.  Drink enough fluid to keep your urine clear or pale yellow. This will help to thin the earwax.  Keep all follow-up visits as told by your health care provider. If earwax builds up in your ears often or if you use hearing aids, consider seeing your health care provider for routine, preventive ear cleanings. Ask your health care provider how often you should schedule your cleanings.  If you have hearing aids, clean them according to instructions from the manufacturer and your health care provider. Contact a health care provider if:  You have ear pain.  You develop a fever.  You have blood, pus, or other fluid coming from your ear.  You have hearing loss.  You have ringing in your ears that does not go away.  Your symptoms do not improve with treatment.  You feel like the room is spinning (vertigo). Summary  Earwax can build up in the ear and cause discomfort or hearing loss.  The most common symptoms of this condition include reduced or muffled hearing and a feeling of fullness in the ear or feeling that  the ear is plugged.  This condition may be diagnosed based on your symptoms, your medical history, and an ear exam.  This condition may be treated by using ear drops to soften the earwax or by having the earwax removed by a health care provider.  Do not put any objects, including cotton swabs, into your ear. You can clean the opening of your ear canal with a washcloth or facial tissue. This information is not intended to replace advice given to you by your health care provider. Make sure you discuss any questions you have with your  health care provider. Document Revised: 02/23/2017 Document Reviewed: 05/24/2016 Elsevier Patient Education  2020 [employer].

## 2019-08-26 NOTE — Progress Notes (Addendum)
Acute Office Visit  Subjective:    Patient ID: Brittany Mercer, female    DOB: 31-Oct-1945, 74 y.o.   MRN: [id number]  Chief Complaint  Patient presents with  . Ears Stopped up    Went for hearing test and was unable to perform due to wax build up    HPI Patient is in today for impacted cerumen, this is not a new problem for patient, She reports going  for a hearing test. The test was not completed due to excessive ear wax. Patient denies pain , headaches but has mild dizziness and ringing in the ear which is not new.   Past Medical History:  Diagnosis Date  . Cataract   . Chronic narcotic use    takes Nucynta for neuropathic pain  . Headache   . Hereditary and idiopathic peripheral neuropathy    followed by pcp  . Hyperlipidemia   . Hypertension   . Hypokalemia   . OA (osteoarthritis)    LEFT KNEE  . Peripheral neuropathy   . Wears glasses     Past Surgical History:  Procedure Laterality Date  . ABDOMINAL HYSTERECTOMY  1980s   W/ UNILATERAL SALPINGOOPHORECTOMY  . CATARACT EXTRACTION Right   . CESAREAN SECTION  x2   last one 99  . 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 EYE SURGERY    . KNEE ARTHROSCOPY Right x2  last one 2016  . KNEE CLOSED REDUCTION Left 04/11/2018   Procedure: CLOSED MANIPULATION KNEE;  Surgeon: Paralee Cancel, MD;  Location: [facility];  Service: Orthopedics;  Laterality: Left;  . SHOULDER OPEN ROTATOR CUFF REPAIR Left 1990s  . TONSILLECTOMY  age 89  . 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 TOTAL KNEE ARTHROPLASTY Left 02/05/2018   Procedure: LEFT TOTAL KNEE ARTHROPLASTY;  Surgeon: Paralee Cancel, MD;  Location: [facility];  Service: Orthopedics;  Laterality: Left;  70 mins    Family History  Problem Relation Age of Onset  . Hypertension Mother   . Hyperlipidemia Mother   . Stroke Mother   . Heart  disease Mother   . Hypertension Father   . Dementia Father   . Neuropathy Father   . Hypertension Brother   . Arthritis Brother     Social History   Socioeconomic History  . Marital status: Widowed    Spouse name: Not on file  . Number of children: 2  . Years of education: college  . Highest education level: Bachelor's degree (e.g., BA, AB, BS)  Occupational History  . Occupation: Retired  . Occupation: Engineer, site  Tobacco Use  . Smoking status: Former Smoker    Packs/day: 0.50    Years: 30.00    Pack years: 15.00    Types: Cigarettes    Quit date: 07/18/1998    Years since quitting: 21.1  . Smokeless tobacco: Never Used  Substance and Sexual Activity  . Alcohol use: Yes    Alcohol/week: 4.0 standard drinks    Types: 4 Glasses of wine per week  . Drug use: Never  . Sexual activity: Not Currently    Birth control/protection: Surgical  Other Topics Concern  . Not on file  Social History Narrative   Lives alone. Has 2 adult sons and 4 grandchildren.   Social Determinants of Health   Financial Resource Strain:   . Difficulty of Paying Living Expenses:  Food Insecurity:   . Worried About Charity fundraiser in the Last Year:   . Arboriculturist in the Last Year:   Transportation Needs:   . Film/video editor (Medical):   Marland Kitchen Lack of Transportation (Non-Medical):   Physical Activity:   . Days of Exercise per Week:   . Minutes of Exercise per Session:   Stress:   . Feeling of Stress :   Social Connections:   . Frequency of Communication with Friends and Family:   . Frequency of Social Gatherings with Friends and Family:   . Attends Religious Services:   . Active Member of Clubs or Organizations:   . Attends Archivist Meetings:   Marland Kitchen Marital Status:   Intimate Partner Violence:   . Fear of Current or Ex-Partner:   . Emotionally Abused:   Marland Kitchen Physically Abused:   . Sexually Abused:     Outpatient Medications Prior to Visit  Medication Sig  Dispense Refill  . estradiol (ESTRACE) 0.1 MG/GM vaginal cream PLACE 1 APPLICATORFUL VAGINALLY ONCE A WEEK. AT BED TIME 42.5 g 12  . fenofibrate 160 MG tablet TAKE 1 TABLET BY MOUTH DAILY FOR CHOLESTEROL AND TRIGLYCERIDE 90 tablet 1  . ibuprofen (ADVIL,MOTRIN) 200 MG tablet Take 600 mg by mouth daily as needed for moderate pain.    Marland Kitchen loratadine (CLARITIN) 10 MG tablet Take 10 mg by mouth daily at 2 PM.     . metoprolol succinate (TOPROL-XL) 50 MG 24 hr tablet TAKE (1) TABLET BY MOUTH DAILY FOR HIGH BLOOD PRESSURE. (Needs to be seen before next refill) 30 tablet 0  . potassium chloride (KLOR-CON) 10 MEQ tablet TAKE 2 TO 3 TABLETS BY MOUTH DAILY AS DIRECTED 270 tablet 1  . prednisoLONE acetate (PRED FORTE) 1 % ophthalmic suspension Place 1 drop into the right eye 4 (four) times daily.    Derrill Memo ON 09/25/2019] Tapentadol HCl (NUCYNTA) 100 MG TABS Take 1 tablet (100 mg total) by mouth 4 (four) times daily. 120 tablet 0  . Tapentadol HCl 100 MG TABS Take 1 tablet (100 mg total) by mouth 4 (four) times daily. 120 tablet 0  . Tapentadol HCl 100 MG TABS Take 1 tablet (100 mg total) by mouth 4 (four) times daily. 120 tablet 0  . traZODone (DESYREL) 150 MG tablet SMARTSIG:1/3-1 Tablet(s) By Mouth Every Night PRN     No facility-administered medications prior to visit.    Allergies  Allergen Reactions  . Statins Other (See Comments)    Myalgia, turns red  . Penicillins Rash and Other (See Comments)    Pt not sure if she is allergic to penicillin or sulfa. So she does not take either one.  DID THE REACTION INVOLVE: Swelling of the face/tongue/throat, SOB, or low BP? Unknown Sudden or severe rash/hives, skin peeling, or the inside of the mouth or nose? No Did it require medical treatment? No When did it last happen?childhood allergy If all above answers are "NO", may proceed with cephalosporin use.  . Sulfa Antibiotics Rash and Other (See Comments)    Pt not sure if she is allergic to sulfa or  penicillin so she doesn't take either one.     Review of Systems  Constitutional: Negative.   HENT: Positive for ear discharge, facial swelling and tinnitus. Negative for congestion, drooling and ear pain.   Eyes: Negative.   Respiratory: Negative for chest tightness and shortness of breath.   Cardiovascular: Negative.   Genitourinary: Negative for difficulty  urinating.  Neurological: Positive for dizziness. Negative for light-headedness and headaches.  Psychiatric/Behavioral: The patient is not nervous/anxious.        Objective:    Physical Exam Constitutional:      Appearance: Normal appearance.  HENT:     Head: Normocephalic.     Right Ear: There is impacted cerumen.     Left Ear: There is impacted cerumen.     Nose: Nose normal.  Eyes:     Conjunctiva/sclera: Conjunctivae normal.  Cardiovascular:     Rate and Rhythm: Normal rate and regular rhythm.     Pulses: Normal pulses.     Heart sounds: Normal heart sounds.  Pulmonary:     Effort: Pulmonary effort is normal.     Breath sounds: Normal breath sounds.  Abdominal:     General: Bowel sounds are normal.  Skin:    General: Skin is warm.  Neurological:     Mental Status: She is alert and oriented to person, place, and time.  Psychiatric:        Mood and Affect: Mood normal.        Behavior: Behavior normal.     BP (!) 141/90   Pulse 73   Temp 98.1 F (36.7 C) (Temporal)   Ht 5\' 9"  (1.753 m)   Wt 185 lb 6.4 oz (84.1 kg)   SpO2 97%   BMI 27.38 kg/m  Wt Readings from Last 3 Encounters:  08/26/19 185 lb 6.4 oz (84.1 kg)  06/12/18 176 lb 4 oz (79.9 kg)  06/06/18 179 lb (81.2 kg)    Health Maintenance Due  Topic Date Due  . COVID-19 Vaccine (1) Never done  . PAP SMEAR-Modifier  04/09/2015  . COLON CANCER SCREENING ANNUAL FOBT  06/15/2015      Lab Results  Component Value Date   TSH 4.790 (H) 04/24/2016   Lab Results  Component Value Date   WBC 6.3 10/30/2018   HGB 15.5 10/30/2018   HCT 44.1  10/30/2018   MCV 89 10/30/2018   PLT 192 10/30/2018   Lab Results  Component Value Date   NA 143 10/30/2018   K 4.7 10/30/2018   CO2 22 10/30/2018   GLUCOSE 85 10/30/2018   BUN 24 10/30/2018   CREATININE 1.06 (H) 10/30/2018   BILITOT 0.7 10/30/2018   ALKPHOS 58 10/30/2018   AST 17 10/30/2018   ALT 15 10/30/2018   PROT 6.5 10/30/2018   ALBUMIN 4.6 10/30/2018   CALCIUM 9.4 10/30/2018   ANIONGAP 7 02/06/2018   Lab Results  Component Value Date   CHOL 200 (H) 10/30/2018   Lab Results  Component Value Date   HDL 46 10/30/2018   Lab Results  Component Value Date   LDLCALC 133 (H) 10/30/2018   Lab Results  Component Value Date   TRIG 103 10/30/2018   Lab Results  Component Value Date   CHOLHDL 4.3 10/30/2018   Lab Results  Component Value Date   HGBA1C 4.9 01/30/2018       Assessment & Plan:   Problem List Items Addressed This Visit      Nervous and Auditory   Excessive cerumen in both ear canals - Primary    Patient is a 74 year old presenting with Ear wax build up not well managed. patients ear flushed and patent. Education provided to keep ear canal patent by prophylactically using debrox to soften ear wax one a week  for easy removal.  No orders of the defined types were placed in this encounter.    Ivy Lynn, NP

## 2019-08-27 DIAGNOSIS — H43811 Vitreous degeneration, right eye: Secondary | ICD-10-CM | POA: Diagnosis not present

## 2019-08-27 DIAGNOSIS — Z961 Presence of intraocular lens: Secondary | ICD-10-CM | POA: Diagnosis not present

## 2019-08-27 DIAGNOSIS — H2512 Age-related nuclear cataract, left eye: Secondary | ICD-10-CM | POA: Diagnosis not present

## 2019-08-27 DIAGNOSIS — Z947 Corneal transplant status: Secondary | ICD-10-CM | POA: Diagnosis not present

## 2019-08-27 DIAGNOSIS — H18519 Endothelial corneal dystrophy, unspecified eye: Secondary | ICD-10-CM | POA: Diagnosis not present

## 2019-08-27 DIAGNOSIS — Z9841 Cataract extraction status, right eye: Secondary | ICD-10-CM | POA: Diagnosis not present

## 2019-09-11 ENCOUNTER — Other Ambulatory Visit: Payer: Self-pay

## 2019-09-11 MED ORDER — METOPROLOL SUCCINATE ER 50 MG PO TB24: ORAL_TABLET | ORAL | 2 refills | Status: DC

## 2019-09-17 ENCOUNTER — Other Ambulatory Visit: Payer: Self-pay

## 2019-09-17 ENCOUNTER — Ambulatory Visit
Admission: RE | Admit: 2019-09-17 | Discharge: 2019-09-17 | Disposition: A | Discharge status: Home / Self Care | Payer: Medicare Other | Source: Ambulatory Visit | Attending: Family Medicine | Admitting: Family Medicine

## 2019-09-17 DIAGNOSIS — Z1231 Encounter for screening mammogram for malignant neoplasm of breast: Secondary | ICD-10-CM

## 2019-10-15 DIAGNOSIS — H2512 Age-related nuclear cataract, left eye: Secondary | ICD-10-CM | POA: Diagnosis not present

## 2019-10-15 DIAGNOSIS — H26491 Other secondary cataract, right eye: Secondary | ICD-10-CM | POA: Diagnosis not present

## 2019-10-15 DIAGNOSIS — Z947 Corneal transplant status: Secondary | ICD-10-CM | POA: Diagnosis not present

## 2019-10-15 DIAGNOSIS — H18513 Endothelial corneal dystrophy, bilateral: Secondary | ICD-10-CM | POA: Diagnosis not present

## 2019-10-15 DIAGNOSIS — Z9841 Cataract extraction status, right eye: Secondary | ICD-10-CM | POA: Diagnosis not present

## 2019-10-15 DIAGNOSIS — Z961 Presence of intraocular lens: Secondary | ICD-10-CM | POA: Diagnosis not present

## 2019-10-21 ENCOUNTER — Encounter: Payer: Self-pay | Admitting: Family Medicine

## 2019-10-21 ENCOUNTER — Ambulatory Visit (INDEPENDENT_AMBULATORY_CARE_PROVIDER_SITE_OTHER): Payer: Medicare Other | Admitting: Family Medicine

## 2019-10-21 ENCOUNTER — Other Ambulatory Visit: Payer: Self-pay

## 2019-10-21 VITALS — BP 126/70 | HR 68 | Temp 98.1°F | Ht 69.0 in | Wt 176.2 lb

## 2019-10-21 DIAGNOSIS — G609 Hereditary and idiopathic neuropathy, unspecified: Secondary | ICD-10-CM

## 2019-10-21 DIAGNOSIS — S7001XA Contusion of right hip, initial encounter: Secondary | ICD-10-CM

## 2019-10-21 DIAGNOSIS — I10 Essential (primary) hypertension: Secondary | ICD-10-CM | POA: Diagnosis not present

## 2019-10-21 MED ORDER — TAPENTADOL HCL 100 MG PO TABS: 100.0000 mg | ORAL_TABLET | Freq: Four times a day (QID) | ORAL | 0 refills | Status: DC

## 2019-10-21 MED ORDER — TRAZODONE HCL 150 MG PO TABS: 150.0000 mg | ORAL_TABLET | Freq: Every day | ORAL | 1 refills | Status: DC

## 2019-10-21 MED ORDER — NUCYNTA 100 MG PO TABS: 100.0000 mg | ORAL_TABLET | Freq: Four times a day (QID) | ORAL | 0 refills | Status: DC

## 2019-10-21 MED ORDER — TIZANIDINE HCL 6 MG PO CAPS: 6.0000 mg | ORAL_CAPSULE | Freq: Three times a day (TID) | ORAL | 1 refills | Status: DC | PRN

## 2019-10-21 NOTE — Progress Notes (Signed)
Subjective:  Patient ID: Brittany Mercer, female    DOB: November 17, 1945  Age: 74 y.o. MRN: [id number]  CC: Follow-up (3 month)   HPI Brittany Mercer presents for follow-up on her chronic neuropathy pain.  She says that it is working well overall but she is having occasional problems with her feet and feeling like they are on fire at night in spite of using the medicine.  Occasionally she takes one a little early in the day rather than when she normally would in the morning she will take it in the middle of the night.  Patient also reports some pain in the right buttock after having a fall.  Depression screen [facility] 2/9 10/21/2019 08/26/2019 06/09/2019  Decreased Interest 0 0 0  Down, Depressed, Hopeless 0 0 0  PHQ - 2 Score 0 0 0  Altered sleeping - - -  Tired, decreased energy - - -  Change in appetite - - -  Feeling bad or failure about yourself  - - -  Trouble concentrating - - -  Moving slowly or fidgety/restless - - -  Suicidal thoughts - - -  PHQ-9 Score - - -  Difficult doing work/chores - - -  Some recent data might be hidden    History Dorice has a past medical history of Cataract, Chronic narcotic use, Headache, Hereditary and idiopathic peripheral neuropathy, Hyperlipidemia, Hypertension, Hypokalemia, OA (osteoarthritis), Peripheral neuropathy, and Wears glasses.   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   Her family history includes Arthritis in her brother; Dementia in her father; Heart disease in her mother; Hyperlipidemia in her mother; Hypertension in her  brother, father, and mother; Neuropathy in her father; Stroke in her mother.She reports that she quit smoking about 21 years ago. Her smoking use included cigarettes. She has a 15.00 pack-year smoking history. She has never used smokeless tobacco. She reports current alcohol use of about 4.0 standard drinks of alcohol per week. She reports that she does not use drugs.    ROS Review of Systems  Constitutional: Negative.   HENT: Negative.   Eyes: Negative for visual disturbance.  Respiratory: Negative for shortness of breath.   Cardiovascular: Negative for chest pain.  Gastrointestinal: Negative for abdominal pain.  Musculoskeletal: Positive for arthralgias and myalgias (right buttock).    Objective:  BP 126/70   Pulse 68   Temp 98.1 F (36.7 C) (Temporal)   Ht 5\' 9"  (1.753 m)   Wt 176 lb 3.2 oz (79.9 kg)   BMI 26.02 kg/m   BP Readings from Last 3 Encounters:  10/21/19 126/70  08/26/19 (!) 141/90  06/12/18 (!) 164/92    Wt Readings from Last 3 Encounters:  10/21/19 176 lb 3.2 oz (79.9 kg)  08/26/19 185 lb 6.4 oz (84.1 kg)  06/12/18 176 lb 4 oz (79.9 kg)     Physical Exam Constitutional:      General: She is not in acute distress.    Appearance: She is well-developed.  HENT:     Head: Normocephalic and atraumatic.  Eyes:     Conjunctiva/sclera: Conjunctivae normal.     Pupils: Pupils are equal, round, and reactive to light.  Neck:     Thyroid: No thyromegaly.  Cardiovascular:     Rate and Rhythm: Normal rate and regular rhythm.     Heart sounds: Normal heart sounds. No murmur heard.   Pulmonary:     Effort: Pulmonary effort is normal. No respiratory distress.     Breath sounds: Normal breath sounds. No wheezing or rales.  Abdominal:     Palpations: Abdomen is soft.     Tenderness: There is no abdominal tenderness.  Musculoskeletal:        General: Tenderness (right buttock) present. Normal range of motion.     Cervical back: Normal range of motion and neck  supple.  Lymphadenopathy:     Cervical: No cervical adenopathy.  Skin:    General: Skin is warm and dry.  Neurological:     Mental Status: She is alert and oriented to person, place, and time.  Psychiatric:        Behavior: Behavior normal.        Thought Content: Thought content normal.        Judgment: Judgment normal.       Assessment & Plan:   Orla was seen today for follow-up.  Diagnoses and all orders for this visit:  Idiopathic peripheral neuropathy -     Tapentadol HCl 100 MG TABS; Take 1 tablet (100 mg total) by mouth 4 (four) times daily. -     Tapentadol HCl (NUCYNTA) 100 MG TABS; Take 1 tablet (100 mg total) by mouth 4 (four) times daily. -     Tapentadol HCl 100 MG TABS; Take 1 tablet (100 mg total) by mouth 4 (four) times daily.  Essential hypertension, benign  Contusion of right hip, initial encounter -     tizanidine (ZANAFLEX) 6 MG capsule; Take 1 capsule (6 mg total) by mouth 3 (three) times daily as needed for muscle spasms.  Other orders -     traZODone (DESYREL) 150 MG tablet; Take 1 tablet (150 mg total) by mouth at bedtime.       I have changed Rejeana Brock. Pegg "Trish"'s traZODone. I am also having her start on Tapentadol HCl and tizanidine. Additionally, I am having her maintain her loratadine, ibuprofen, estradiol, fenofibrate, prednisoLONE acetate, potassium chloride, metoprolol succinate, Tapentadol HCl, and Nucynta.  Allergies as of 10/21/2019      Reactions   Statins Other (See Comments)   Myalgia, turns red   Penicillins Rash, Other (See Comments)   Pt not sure if she is allergic to penicillin or sulfa. So she does not take either one.  DID THE REACTION INVOLVE: Swelling of the face/tongue/throat, SOB, or low BP? Unknown Sudden or severe rash/hives, skin peeling, or the inside of the mouth or nose? No Did it require medical treatment? No When did it last happen?childhood allergy If all above answers are "NO", may proceed  with cephalosporin use.   Sulfa Antibiotics Rash, Other (See Comments)   Pt not sure if she is allergic to sulfa or penicillin so she doesn't take either one.       Medication List       Accurate as of October 21, 2019  9:14 PM. If you have any questions, ask your nurse or doctor.        estradiol 0.1 MG/GM vaginal cream Commonly known as: ESTRACE PLACE 1 APPLICATORFUL VAGINALLY ONCE A WEEK. AT BED TIME   fenofibrate 160 MG tablet TAKE 1 TABLET BY MOUTH DAILY FOR CHOLESTEROL AND TRIGLYCERIDE   ibuprofen 200 MG tablet  Commonly known as: ADVIL Take 600 mg by mouth daily as needed for moderate pain.   loratadine 10 MG tablet Commonly known as: CLARITIN Take 10 mg by mouth daily at 2 PM.   metoprolol succinate 50 MG 24 hr tablet Commonly known as: TOPROL-XL TAKE (1) TABLET BY MOUTH DAILY FOR HIGH BLOOD PRESSURE.   potassium chloride 10 MEQ tablet Commonly known as: KLOR-CON TAKE 2 TO 3 TABLETS BY MOUTH DAILY AS DIRECTED   prednisoLONE acetate 1 % ophthalmic suspension Commonly known as: PRED FORTE Place 1 drop into the right eye 4 (four) times daily.   Tapentadol HCl 100 MG Tabs Take 1 tablet (100 mg total) by mouth 4 (four) times daily. Start taking on: October 25, 2019 What changed: These instructions start on October 25, 2019. If you are unsure what to do until then, ask your doctor or other care provider. Changed by: Claretta Fraise, MD   Tapentadol HCl 100 MG Tabs Take 1 tablet (100 mg total) by mouth 4 (four) times daily. Start taking on: November 24, 2019 What changed: These instructions start on November 24, 2019. If you are unsure what to do until then, ask your doctor or other care provider. Changed by: Claretta Fraise, MD   Nucynta 100 MG Tabs Generic drug: Tapentadol HCl Take 1 tablet (100 mg total) by mouth 4 (four) times daily. Start taking on: December 24, 2019 What changed: These instructions start on December 24, 2019. If you are unsure what to do until then, ask  your doctor or other care provider. Changed by: Claretta Fraise, MD   tizanidine 6 MG capsule Commonly known as: ZANAFLEX Take 1 capsule (6 mg total) by mouth 3 (three) times daily as needed for muscle spasms. Started by: Claretta Fraise, MD   traZODone 150 MG tablet Commonly known as: DESYREL Take 1 tablet (150 mg total) by mouth at bedtime. What changed: See the new instructions. Changed by: Claretta Fraise, MD        Follow-up: Return in about 3 months (around 01/21/2020).  Claretta Fraise, M.D.

## 2019-11-04 DIAGNOSIS — D0439 Carcinoma in situ of skin of other parts of face: Secondary | ICD-10-CM | POA: Diagnosis not present

## 2019-11-04 DIAGNOSIS — L570 Actinic keratosis: Secondary | ICD-10-CM | POA: Diagnosis not present

## 2019-11-04 DIAGNOSIS — Z85828 Personal history of other malignant neoplasm of skin: Secondary | ICD-10-CM | POA: Diagnosis not present

## 2019-11-04 DIAGNOSIS — D485 Neoplasm of uncertain behavior of skin: Secondary | ICD-10-CM | POA: Diagnosis not present

## 2019-11-13 DIAGNOSIS — C44329 Squamous cell carcinoma of skin of other parts of face: Secondary | ICD-10-CM | POA: Diagnosis not present

## 2019-12-11 ENCOUNTER — Ambulatory Visit: Payer: Medicare Other | Admitting: *Deleted

## 2019-12-11 DIAGNOSIS — I10 Essential (primary) hypertension: Secondary | ICD-10-CM

## 2019-12-11 DIAGNOSIS — E785 Hyperlipidemia, unspecified: Secondary | ICD-10-CM

## 2019-12-11 NOTE — Chronic Care Management (AMB) (Signed)
  Chronic Care Management   Initial Visit Note  12/11/2019 Name: Brittany Mercer MRN: [id number] DOB: [birth year]  Referred by: Claretta Fraise, MD Reason for referral : Chronic Care Management (Initial Visit)   Brittany Mercer is a 74 y.o. year old female who is a primary care patient of Stacks, Cletus Gash, MD. The CCM team was consulted for assistance with chronic disease management and care coordination needs related to HTN and HLD  Review of patient status, including review of consultants reports, relevant laboratory and other test results, and collaboration with appropriate care team members and the patient's provider was performed as part of comprehensive patient evaluation and provision of chronic care management services.    SDOH (Social Determinants of Health) assessments performed: Yes See Care Plan activities for detailed interventions related to SDOH    I spoke with Brittany Mercer by telephone today regarding management of her chronic medical conditions. She does not have any CCM or resource needs and reports that her medical conditions are well controlled. She appreciated the telephone outreach but does not wish to participate in the CCM program at this time. She will reach out to the CCM team if any needs develop in the future.    Plan:   CCM enrollment status changed to "previously enrolled" as per patient request on 12/11/19 to discontinue enrollment. Case closed to case management services in primary care home.   Chong Sicilian, BSN, RN-BC Embedded Chronic Care Manager [facility] Direct Dial: [phone]

## 2019-12-11 NOTE — Patient Instructions (Signed)
Reach out to the CCM team as needed  Chong Sicilian, BSN, RN-BC [facility] Direct Dial: [phone]

## 2019-12-17 ENCOUNTER — Other Ambulatory Visit: Payer: Self-pay | Admitting: Family Medicine

## 2019-12-25 ENCOUNTER — Telehealth: Payer: Self-pay | Admitting: *Deleted

## 2019-12-25 DIAGNOSIS — L821 Other seborrheic keratosis: Secondary | ICD-10-CM | POA: Diagnosis not present

## 2019-12-25 DIAGNOSIS — F1120 Opioid dependence, uncomplicated: Secondary | ICD-10-CM

## 2019-12-25 DIAGNOSIS — D485 Neoplasm of uncertain behavior of skin: Secondary | ICD-10-CM | POA: Diagnosis not present

## 2019-12-25 DIAGNOSIS — M1712 Unilateral primary osteoarthritis, left knee: Secondary | ICD-10-CM

## 2019-12-25 DIAGNOSIS — G609 Hereditary and idiopathic neuropathy, unspecified: Secondary | ICD-10-CM

## 2019-12-25 NOTE — Telephone Encounter (Signed)
Approved today SKAJGO:[id number];BTDHRC:[id number];Review Type:Prior Auth;Coverage Start Date:11/25/2019;Coverage End Date:12/24/2020  [facility] called and aware

## 2019-12-25 NOTE — Telephone Encounter (Signed)
PA came in today for pt - NUCYNTA 100 mg    Tramadol 50 Mg Tab, PA Not Required Morphine 15 Mg Tab, PA Not Required Hydromorphone 2 Mg Tab,   PA Not RequiredTerms of service apply. Alternatives and PA Requirements listed above are based on third party available formulary data and may not apply to all plans. Check patient's specific plan formulary.  Key: [id number] Sent to plan

## 2020-01-18 ENCOUNTER — Other Ambulatory Visit: Payer: Self-pay | Admitting: Family Medicine

## 2020-01-18 DIAGNOSIS — S7001XA Contusion of right hip, initial encounter: Secondary | ICD-10-CM

## 2020-01-19 ENCOUNTER — Other Ambulatory Visit: Payer: Self-pay | Admitting: Family Medicine

## 2020-01-22 DIAGNOSIS — Z23 Encounter for immunization: Secondary | ICD-10-CM | POA: Diagnosis not present

## 2020-01-26 ENCOUNTER — Encounter: Payer: Self-pay | Admitting: Family Medicine

## 2020-01-26 ENCOUNTER — Ambulatory Visit (INDEPENDENT_AMBULATORY_CARE_PROVIDER_SITE_OTHER): Payer: Medicare Other | Admitting: Family Medicine

## 2020-01-26 ENCOUNTER — Other Ambulatory Visit: Payer: Self-pay

## 2020-01-26 VITALS — BP 179/86 | HR 68 | Temp 96.7°F | Resp 20 | Ht 69.0 in | Wt 186.2 lb

## 2020-01-26 DIAGNOSIS — G609 Hereditary and idiopathic neuropathy, unspecified: Secondary | ICD-10-CM | POA: Diagnosis not present

## 2020-01-26 DIAGNOSIS — I10 Essential (primary) hypertension: Secondary | ICD-10-CM

## 2020-01-26 MED ORDER — TAPENTADOL HCL 100 MG PO TABS: 100.0000 mg | ORAL_TABLET | Freq: Four times a day (QID) | ORAL | 0 refills | Status: DC

## 2020-01-26 MED ORDER — FENOFIBRATE 160 MG PO TABS: 160.0000 mg | ORAL_TABLET | Freq: Every day | ORAL | 1 refills | Status: DC

## 2020-01-26 MED ORDER — NUCYNTA 100 MG PO TABS: 100.0000 mg | ORAL_TABLET | Freq: Four times a day (QID) | ORAL | 0 refills | Status: DC

## 2020-01-26 NOTE — Progress Notes (Signed)
Subjective:  Patient ID: Brittany Mercer, female    DOB: 1945-09-05  Age: 74 y.o. MRN: [id number]  CC: Medical Management of Chronic Issues   HPI Brittany Mercer presents for follow-up on her chronic pain management.  She ran out of Nucynta a couple of days ago.  She was willing to do that because she wanted to know how she could do without it.  Unfortunately she did not understand the need to taper.  She ran out 2 days ago and since then she has had pain between 5-10/10.  She has not been able to sleep.  She laid awake all night last night and slept only 1 or 2 nights the night before.  She says there is a burning in her feet so severely that she only has 1 pair of shoes that she can even put on.  Otherwise her feet go numb at times.  She remembers a time few months ago when she literally walked out of her shoe and did not know she done so.  Patient has been taking Nucynta for 12 years after trials of multiple opiate and nonopiate neuropathy medications.  Additionally she has had some elevated blood pressure.  She is concerned that this is due to her lack of sleep and lack of pain control.  She would like to get back on her medicines and then follow-up for a recheck of her blood pressure once her symptoms are under control.  Patient is also followed for elevated cholesterol.  No concerns noted there.  She does need a refill of her fenofibrate.  Depression screen [facility] 2/9 01/26/2020 10/21/2019 08/26/2019  Decreased Interest 0 0 0  Down, Depressed, Hopeless 0 0 0  PHQ - 2 Score 0 0 0  Altered sleeping - - -  Tired, decreased energy - - -  Change in appetite - - -  Feeling bad or failure about yourself  - - -  Trouble concentrating - - -  Moving slowly or fidgety/restless - - -  Suicidal thoughts - - -  PHQ-9 Score - - -  Difficult doing work/chores - - -  Some recent data might be hidden    History Anu has a past medical history of Cataract, Chronic narcotic use,  Headache, Hereditary and idiopathic peripheral neuropathy, Hyperlipidemia, Hypertension, Hypokalemia, OA (osteoarthritis), Peripheral neuropathy, and Wears glasses.   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   Her family history includes Arthritis in her brother; Dementia in her father; Heart disease in her mother; Hyperlipidemia in her mother; Hypertension in her brother, father, and mother; Neuropathy in her father; Stroke in her mother.She reports that she quit smoking about 21 years ago. Her smoking use included cigarettes. She has a 15.00 pack-year smoking history. She has never used smokeless tobacco. She reports current alcohol use of about 4.0 standard drinks of alcohol per week. She reports that she does not use drugs.    ROS Review of Systems  Constitutional: Negative.   HENT: Negative.   Eyes: Negative for visual disturbance.  Respiratory: Negative for shortness of breath.   Cardiovascular: Negative for chest pain.  Gastrointestinal: Negative for abdominal pain.  Musculoskeletal: Negative for  arthralgias.    Objective:  BP (!) 179/86   Pulse 68   Temp (!) 96.7 F (35.9 C) (Temporal)   Resp 20   Ht '5\' 9"'  (1.753 m)   Wt 186 lb 4 oz (84.5 kg)   SpO2 95%   BMI 27.50 kg/m   BP Readings from Last 3 Encounters:  01/26/20 (!) 179/86  10/21/19 126/70  08/26/19 (!) 141/90    Wt Readings from Last 3 Encounters:  01/26/20 186 lb 4 oz (84.5 kg)  10/21/19 176 lb 3.2 oz (79.9 kg)  08/26/19 185 lb 6.4 oz (84.1 kg)     Physical Exam Constitutional:      General: She is not  in acute distress.    Appearance: She is well-developed.  HENT:     Head: Normocephalic and atraumatic.  Eyes:     Conjunctiva/sclera: Conjunctivae normal.     Pupils: Pupils are equal, round, and reactive to light.  Neck:     Thyroid: No thyromegaly.  Cardiovascular:     Rate and Rhythm: Normal rate and regular rhythm.     Heart sounds: Normal heart sounds. No murmur heard.   Pulmonary:     Effort: Pulmonary effort is normal. No respiratory distress.     Breath sounds: Normal breath sounds. No wheezing or rales.  Abdominal:     General: Bowel sounds are normal. There is no distension.     Palpations: Abdomen is soft.     Tenderness: There is no abdominal tenderness.  Musculoskeletal:        General: Normal range of motion.     Cervical back: Normal range of motion and neck supple.  Lymphadenopathy:     Cervical: No cervical adenopathy.  Skin:    General: Skin is warm and dry.  Neurological:     Mental Status: She is alert and oriented to person, place, and time.  Psychiatric:        Behavior: Behavior normal.        Thought Content: Thought content normal.        Judgment: Judgment normal.       Assessment & Plan:   Salma was seen today for medical management of chronic issues.  Diagnoses and all orders for this visit:  Essential hypertension, benign  Accelerated hypertension -     CBC with Differential/Platelet -     CMP14+EGFR -     TSH  Idiopathic peripheral neuropathy -     Tapentadol HCl (NUCYNTA) 100 MG TABS; Take 1 tablet (100 mg total) by mouth 4 (four) times daily. -     Tapentadol HCl 100 MG TABS; Take 1 tablet (100 mg total) by mouth 4 (four) times daily. -     Tapentadol HCl 100 MG TABS; Take 1 tablet (100 mg total) by mouth 4 (four) times daily.  Other orders -     fenofibrate 160 MG tablet; Take 1 tablet (160 mg total) by mouth daily.       I have changed Brittany Mercer. Schneck "Trish"'s fenofibrate. I am also having her maintain her  loratadine, ibuprofen, estradiol, prednisoLONE acetate, potassium chloride, traZODone, metoprolol succinate, tizanidine, Nucynta, Tapentadol HCl, and Tapentadol HCl.  Allergies as of 01/26/2020      Reactions   Statins Other (See Comments)   Myalgia, turns red   Penicillins Rash, Other (See Comments)   Pt not sure if she is allergic to penicillin or sulfa. So she does not take either one.  DID THE REACTION INVOLVE: Swelling  of the face/tongue/throat, SOB, or low BP? Unknown Sudden or severe rash/hives, skin peeling, or the inside of the mouth or nose? No Did it require medical treatment? No When did it last happen?childhood allergy If all above answers are "NO", may proceed with cephalosporin use.   Sulfa Antibiotics Rash, Other (See Comments)   Pt not sure if she is allergic to sulfa or penicillin so she doesn't take either one.       Medication List       Accurate as of January 26, 2020  8:11 PM. If you have any questions, ask your nurse or doctor.        estradiol 0.1 MG/GM vaginal cream Commonly known as: ESTRACE PLACE 1 APPLICATORFUL VAGINALLY ONCE A WEEK. AT BED TIME   fenofibrate 160 MG tablet Take 1 tablet (160 mg total) by mouth daily. What changed: See the new instructions. Changed by: Claretta Fraise, MD   ibuprofen 200 MG tablet Commonly known as: ADVIL Take 600 mg by mouth daily as needed for moderate pain.   loratadine 10 MG tablet Commonly known as: CLARITIN Take 10 mg by mouth daily at 2 PM.   metoprolol succinate 50 MG 24 hr tablet Commonly known as: TOPROL-XL TAKE (1) TABLET BY MOUTH DAILY FOR HIGH BLOOD PRESSURE.   Tapentadol HCl 100 MG Tabs Take 1 tablet (100 mg total) by mouth 4 (four) times daily. What changed: Another medication with the same name was changed. Make sure you understand how and when to take each. Changed by: Claretta Fraise, MD   Tapentadol HCl 100 MG Tabs Take 1 tablet (100 mg total) by mouth 4 (four) times daily. Start  taking on: February 25, 2020 What changed: These instructions start on February 25, 2020. If you are unsure what to do until then, ask your doctor or other care provider. Changed by: Claretta Fraise, MD   Nucynta 100 MG Tabs Generic drug: Tapentadol HCl Take 1 tablet (100 mg total) by mouth 4 (four) times daily. Start taking on: March 26, 2020 What changed: These instructions start on March 26, 2020. If you are unsure what to do until then, ask your doctor or other care provider. Changed by: Claretta Fraise, MD   potassium chloride 10 MEQ tablet Commonly known as: KLOR-CON TAKE 2 TO 3 TABLETS BY MOUTH DAILY AS DIRECTED   prednisoLONE acetate 1 % ophthalmic suspension Commonly known as: PRED FORTE Place 1 drop into the right eye 4 (four) times daily.   tizanidine 6 MG capsule Commonly known as: ZANAFLEX TAKE 1 CAPSULE (6 MG TOTAL) BY MOUTH 3 (THREE) TIMES DAILY AS NEEDED FOR MUSCLE SPASMS.   traZODone 150 MG tablet Commonly known as: DESYREL Take 1 tablet (150 mg total) by mouth at bedtime.        Follow-up: Return in about 3 months (around 04/27/2020) for Compete physical, Pain.  Claretta Fraise, M.D.

## 2020-01-27 LAB — CBC WITH DIFFERENTIAL/PLATELET
Basophils Absolute: 0 10*3/uL (ref 0.0–0.2)
Basos: 1 %
EOS (ABSOLUTE): 0.3 10*3/uL (ref 0.0–0.4)
Eos: 4 %
Hematocrit: 46.1 % (ref 34.0–46.6)
Hemoglobin: 15.6 g/dL (ref 11.1–15.9)
Immature Grans (Abs): 0 10*3/uL (ref 0.0–0.1)
Immature Granulocytes: 1 %
Lymphocytes Absolute: 1.4 10*3/uL (ref 0.7–3.1)
Lymphs: 20 %
MCH: 31.3 pg (ref 26.6–33.0)
MCHC: 33.8 g/dL (ref 31.5–35.7)
MCV: 93 fL (ref 79–97)
Monocytes Absolute: 0.6 10*3/uL (ref 0.1–0.9)
Monocytes: 8 %
Neutrophils Absolute: 4.4 10*3/uL (ref 1.4–7.0)
Neutrophils: 66 %
Platelets: 196 10*3/uL (ref 150–450)
RBC: 4.98 x10E6/uL (ref 3.77–5.28)
RDW: 12.8 % (ref 11.7–15.4)
WBC: 6.6 10*3/uL (ref 3.4–10.8)

## 2020-01-27 LAB — CMP14+EGFR
ALT: 22 IU/L (ref 0–32)
AST: 24 IU/L (ref 0–40)
Albumin/Globulin Ratio: 2.3 — ABNORMAL HIGH (ref 1.2–2.2)
Albumin: 4.8 g/dL — ABNORMAL HIGH (ref 3.7–4.7)
Alkaline Phosphatase: 64 IU/L (ref 44–121)
BUN/Creatinine Ratio: 21 (ref 12–28)
BUN: 21 mg/dL (ref 8–27)
Bilirubin Total: 0.6 mg/dL (ref 0.0–1.2)
CO2: 22 mmol/L (ref 20–29)
Calcium: 9.7 mg/dL (ref 8.7–10.3)
Chloride: 107 mmol/L — ABNORMAL HIGH (ref 96–106)
Creatinine, Ser: 1.01 mg/dL — ABNORMAL HIGH (ref 0.57–1.00)
GFR calc Af Amer: 64 mL/min/{1.73_m2} (ref >59)
GFR calc non Af Amer: 55 mL/min/{1.73_m2} — ABNORMAL LOW (ref >59)
Globulin, Total: 2.1 g/dL (ref 1.5–4.5)
Glucose: 81 mg/dL (ref 65–99)
Potassium: 4.6 mmol/L (ref 3.5–5.2)
Sodium: 142 mmol/L (ref 134–144)
Total Protein: 6.9 g/dL (ref 6.0–8.5)

## 2020-01-27 LAB — TSH: TSH: 3.79 u[IU]/mL (ref 0.450–4.500)

## 2020-01-27 NOTE — Progress Notes (Signed)
Hello Jocelyn, ? ?Your lab result is normal and/or stable.Some minor variations that are not significant are commonly marked abnormal, but do not represent any medical problem for you. ? ?Best regards, ?Renly Guedes, M.D.

## 2020-01-29 ENCOUNTER — Ambulatory Visit (INDEPENDENT_AMBULATORY_CARE_PROVIDER_SITE_OTHER): Payer: Medicare Other | Admitting: *Deleted

## 2020-01-29 VITALS — BP 140/80

## 2020-01-29 DIAGNOSIS — I10 Essential (primary) hypertension: Secondary | ICD-10-CM

## 2020-01-29 NOTE — Progress Notes (Signed)
Patient in today for BP check. 140/80.

## 2020-02-04 IMAGING — CT CT CERVICAL SPINE W/O CM
4 of 7 series · 13 of 33 positions shown, 14 images · non-contrast
Comparison: Head CT June 25, 2013

CLINICAL DATA: Unwitnessed fall.  Hypertension.

EXAM:
CT HEAD WITHOUT CONTRAST
CT CERVICAL SPINE WITHOUT CONTRAST
TECHNIQUE: Multidetector CT imaging of the head and cervical spine was
performed following the standard protocol without intravenous
contrast. Multiplanar CT image reconstructions of the cervical spine
were also generated.

[Series 8: c spine soft · axial · 0.27mm/px · z∈[+1078,+1192]mm · 4 of 96 slices shown]
[im 20/96  soft-tissue]
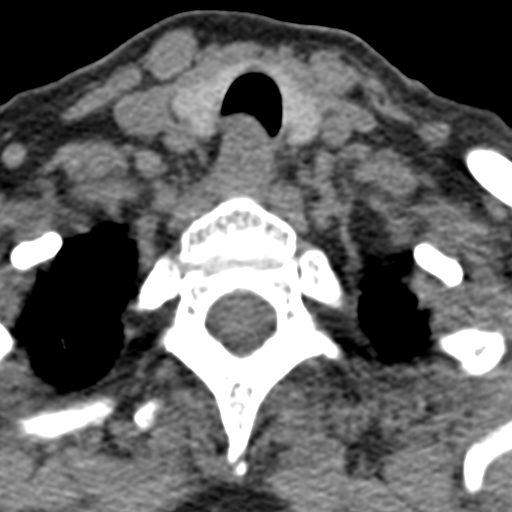
[im 39/96  soft-tissue]
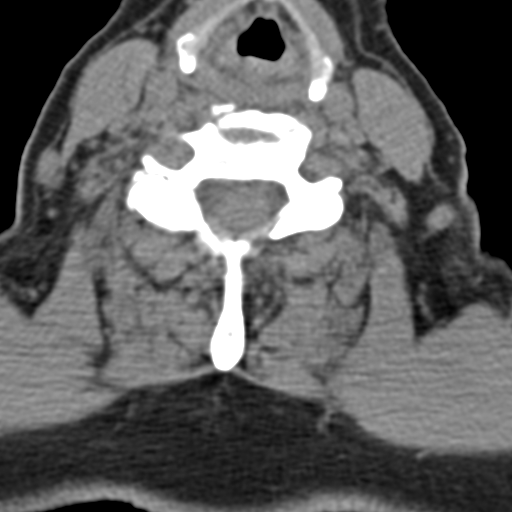
[im 58/96  soft-tissue]
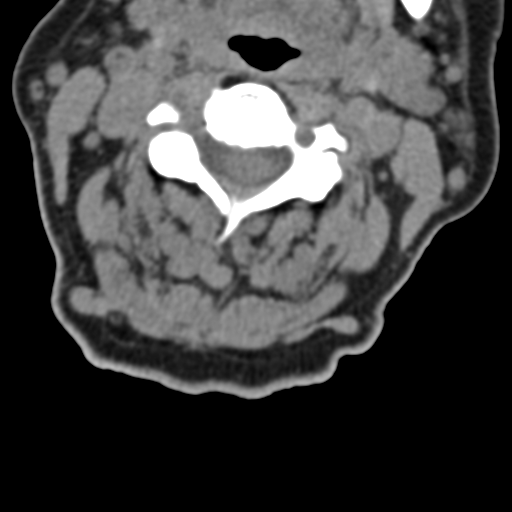
[im 77/96  soft-tissue]
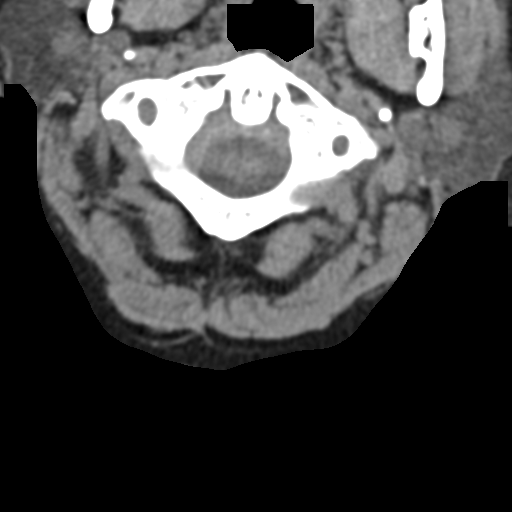

[Series 9: orthogonal bone · axial · 0.19mm/px · z∈[+1064,+1168]mm · 4 of 95 slices shown, 5 images]
[im 19/95  soft-tissue]
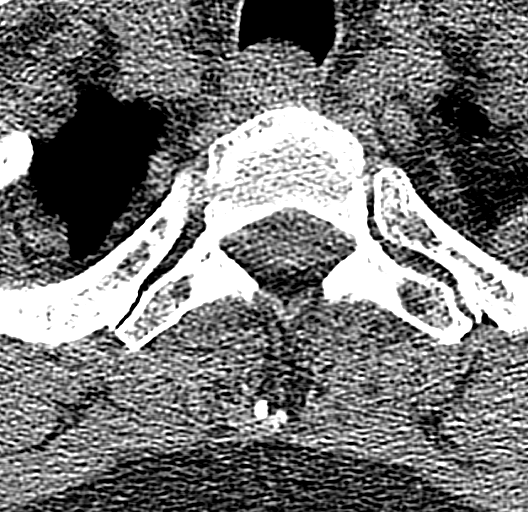
[im 19/95  bone]
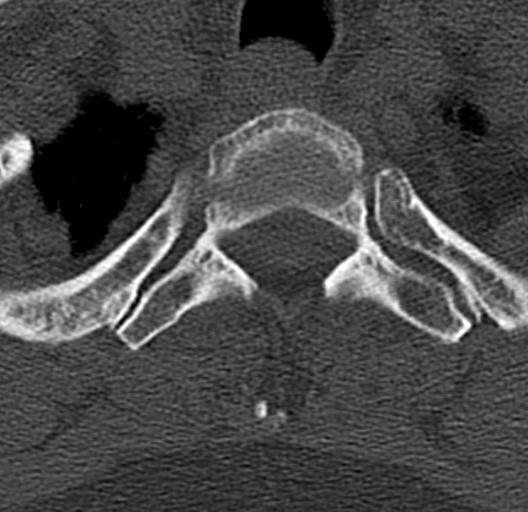
[im 38/95  bone]
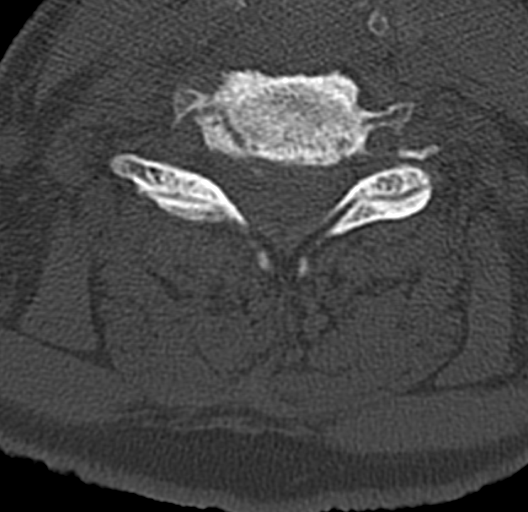
[im 57/95  bone]
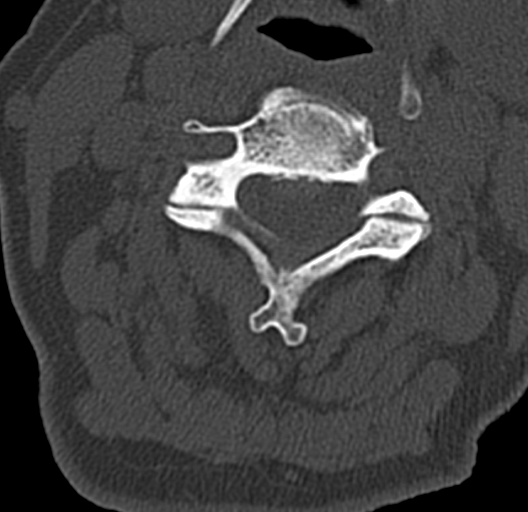
[im 76/95  bone]
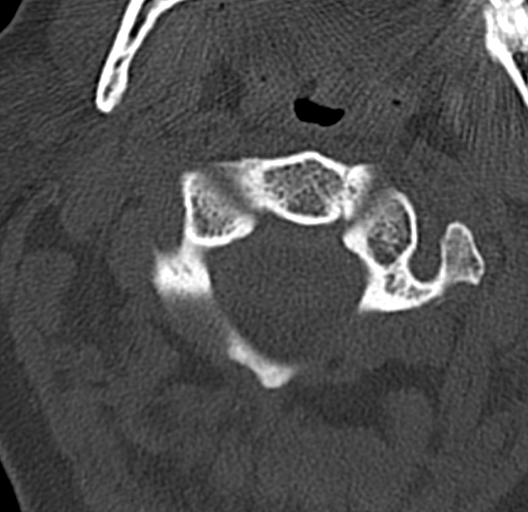

[Series 10: coronal bone · coronal · 0.18mm/px · 1 of 53 slices shown]
[im 27/53  bone]
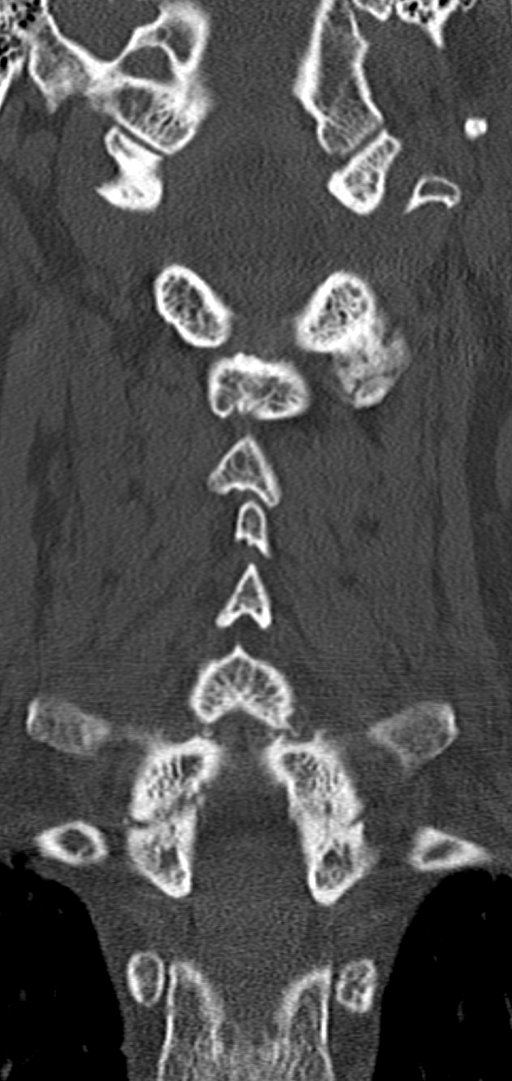

[Series 11: sagittal bone · sagittal · 0.20mm/px · 4 of 44 slices shown]
[im 9/44  bone]
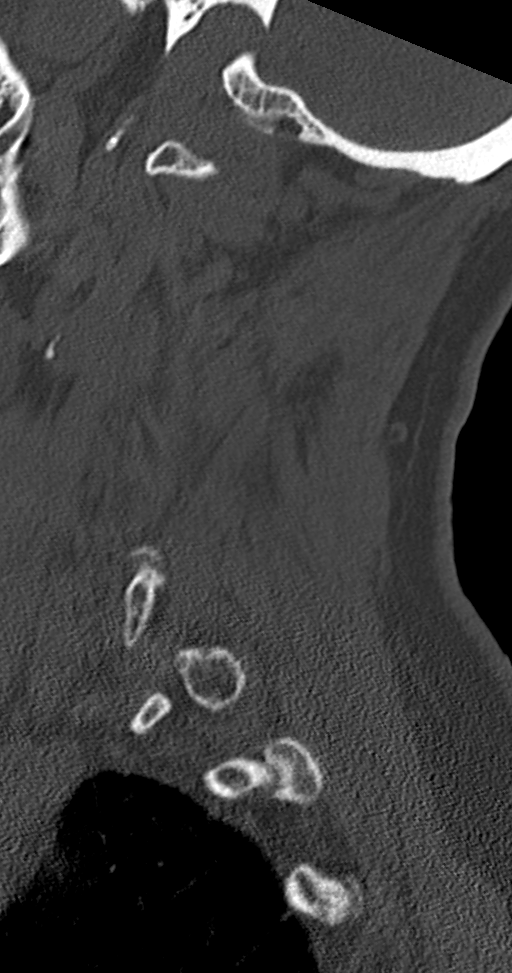
[im 18/44  bone]
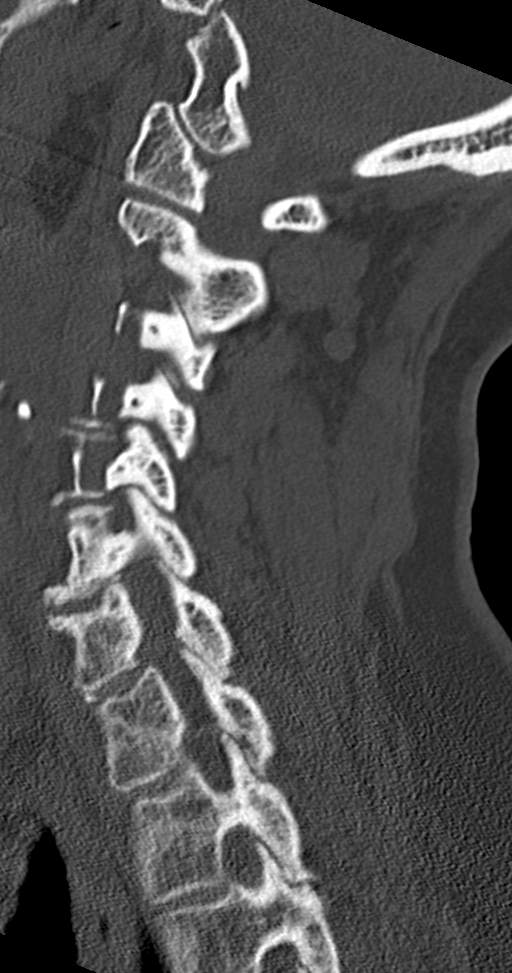
[im 26/44  bone]
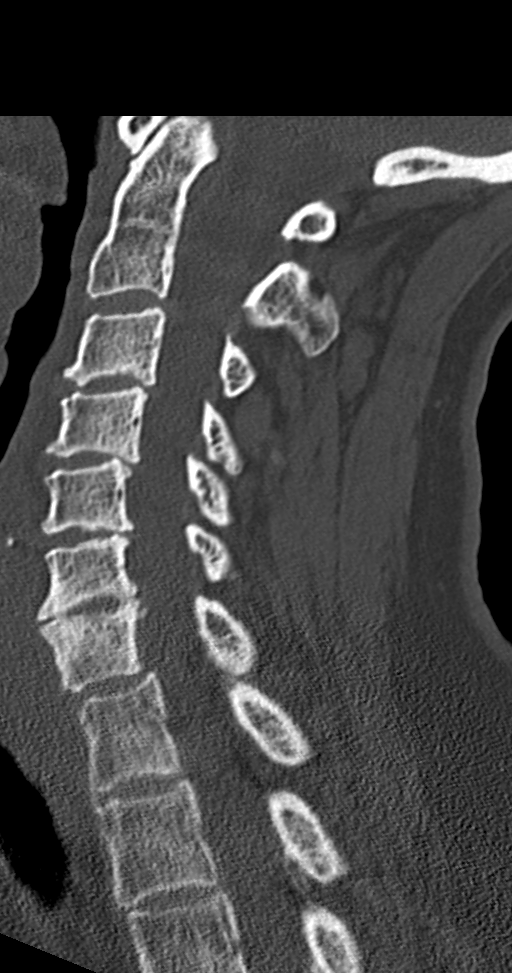
[im 35/44  bone]
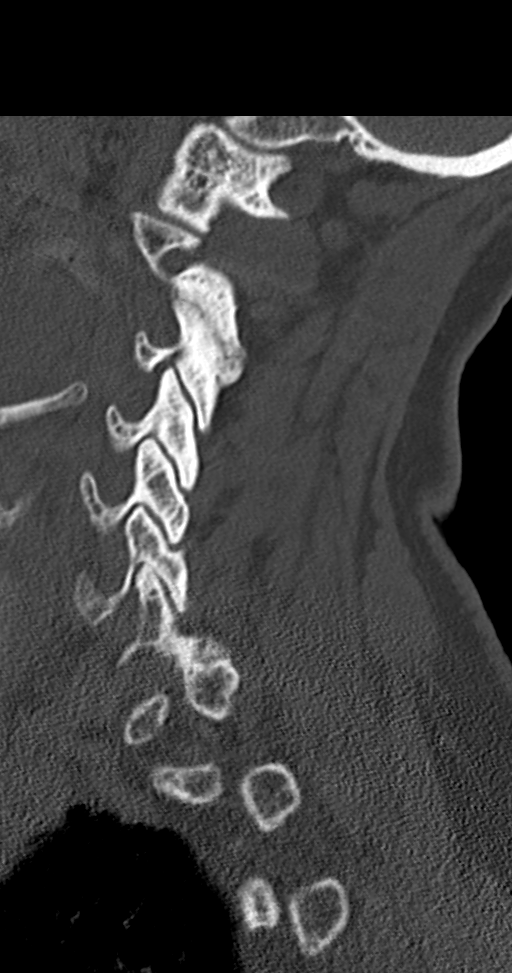

[13 of 33 positions shown; findings below may reference images not displayed]

FINDINGS: CT HEAD FINDINGS

Brain: The ventricles are normal in size and configuration. There is
no intracranial mass, hemorrhage, extra-axial fluid collection, or
midline shift. Gray-white compartments appear normal. No acute
infarct evident. There is stable minimal basal ganglia
calcification, likely physiologic.

Vascular: No evident hyperdense vessel. There are foci of
calcification in each carotid siphon and distal vertebral artery
region.

Skull: There is a right frontal scalp hematoma. Bony calvarium
appears intact.

Sinuses/Orbits: There are foci of mucosal thickening in each
maxillary antrum with an apparent retention cyst in the
posterolateral right maxillary antrum. There is mucosal thickening
in several ethmoid air cells bilaterally with a small retention cyst
in a mid left ethmoid air cell. Other paranasal sinuses are clear.
Orbits appear symmetric bilaterally.

Other: The mastoid air cells are clear. There is debris in the left
external auditory canal.

CT CERVICAL SPINE FINDINGS

Alignment: There is 1 mm of retrolisthesis of C4 on C5. No other
spondylolisthesis evident.

Skull base and vertebrae: Skull base and craniocervical junction
regions are normal. There is no evident fracture. There are no
blastic or lytic bone lesions.

Soft tissues and spinal canal: Prevertebral soft tissues and
predental space regions are normal. There is no paraspinous lesion.
There is no cord or canal hematoma.

Disc levels: There is moderately severe disc space narrowing at
C3-4, C4-5, and C6-7. There is milder disc space narrowing at C7-T1.
There is multilevel facet osteoarthritic change. There is exit
foraminal narrowing due to bony hypertrophy on the right at C4-5 and
at C5-6 bilaterally. No disc extrusion or stenosis evident.

Upper chest: Visualized upper lung zones are clear.

Other: There is calcification in each carotid artery.
IMPRESSION: CT head: No intracranial mass or hemorrhage. No extra-axial fluid
collection. Gray-white compartments appear normal.

There are foci of arterial vascular calcification. There is a right
frontal scalp hematoma. There are areas of paranasal sinus disease.
There is probable cerumen in the left external auditory canal.

CT cervical spine: No fracture. Minimal spondylolisthesis at C4-5 is
felt to be due to underlying spondylosis. No other spondylolisthesis
evident.

Multilevel osteoarthritic change. Bilateral carotid artery
calcification noted.

## 2020-02-06 ENCOUNTER — Other Ambulatory Visit: Payer: Self-pay | Admitting: Family Medicine

## 2020-03-17 ENCOUNTER — Encounter: Payer: Self-pay | Admitting: Family Medicine

## 2020-03-17 ENCOUNTER — Other Ambulatory Visit: Payer: Self-pay | Admitting: Family Medicine

## 2020-03-17 MED ORDER — ZALEPLON 10 MG PO CAPS: 10.0000 mg | ORAL_CAPSULE | Freq: Every evening | ORAL | 0 refills | Status: DC | PRN

## 2020-03-29 ENCOUNTER — Ambulatory Visit: Payer: Medicare Other | Admitting: Family Medicine

## 2020-04-07 ENCOUNTER — Ambulatory Visit: Payer: Medicare Other | Admitting: Family Medicine

## 2020-04-19 ENCOUNTER — Other Ambulatory Visit: Payer: Self-pay | Admitting: Family Medicine

## 2020-04-19 ENCOUNTER — Ambulatory Visit (INDEPENDENT_AMBULATORY_CARE_PROVIDER_SITE_OTHER): Payer: Medicare Other | Admitting: Family Medicine

## 2020-04-19 ENCOUNTER — Other Ambulatory Visit: Payer: Self-pay

## 2020-04-19 DIAGNOSIS — Z79891 Long term (current) use of opiate analgesic: Secondary | ICD-10-CM | POA: Diagnosis not present

## 2020-04-19 DIAGNOSIS — G609 Hereditary and idiopathic neuropathy, unspecified: Secondary | ICD-10-CM

## 2020-04-20 ENCOUNTER — Encounter: Payer: Self-pay | Admitting: Family Medicine

## 2020-04-20 MED ORDER — TAPENTADOL HCL 100 MG PO TABS: 100.0000 mg | ORAL_TABLET | Freq: Four times a day (QID) | ORAL | 0 refills | Status: DC

## 2020-04-20 MED ORDER — ZALEPLON 10 MG PO CAPS: 10.0000 mg | ORAL_CAPSULE | Freq: Every evening | ORAL | 0 refills | Status: DC | PRN

## 2020-04-20 MED ORDER — METOPROLOL SUCCINATE ER 50 MG PO TB24: ORAL_TABLET | ORAL | 0 refills | Status: DC

## 2020-04-20 MED ORDER — NUCYNTA 100 MG PO TABS: 100.0000 mg | ORAL_TABLET | Freq: Four times a day (QID) | ORAL | 0 refills | Status: DC

## 2020-04-20 NOTE — Progress Notes (Signed)
Subjective:  Patient ID: Brittany Mercer, female    DOB: 12-20-1945  Age: 75 y.o. MRN: [id number]  CC: Medical Management of Chronic Issues   HPI Brittany Mercer presents for three month check of her chronic pain. LEvel of pain remains stable on current dose of nucynta. She is considering decreasing the dose as a result of the massive increase in price of the med projected by her insurance company. It is going to be $1300 per month for her current medication and she cannot afford that.  She has a mild amount of burning now, but it is well controlled by her current med. She experiences increased neuropathy symptoms when she tries to spread out the dose interval.   Depression screen [facility] 2/9 04/19/2020 01/26/2020 10/21/2019  Decreased Interest 0 0 0  Down, Depressed, Hopeless 0 0 0  PHQ - 2 Score 0 0 0  Altered sleeping - - -  Tired, decreased energy - - -  Change in appetite - - -  Feeling bad or failure about yourself  - - -  Trouble concentrating - - -  Moving slowly or fidgety/restless - - -  Suicidal thoughts - - -  PHQ-9 Score - - -  Difficult doing work/chores - - -  Some recent data might be hidden    History Brittany Mercer has a past medical history of Cataract, Chronic narcotic use, Headache, Hereditary and idiopathic peripheral neuropathy, Hyperlipidemia, Hypertension, Hypokalemia, OA (osteoarthritis), Peripheral neuropathy, and Wears glasses.   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   Her family history includes Arthritis in her brother; Dementia in her father;  Heart disease in her mother; Hyperlipidemia in her mother; Hypertension in her brother, father, and mother; Neuropathy in her father; Stroke in her mother.She reports that she quit smoking about 21 years ago. Her smoking use included cigarettes. She has a 15.00 pack-year smoking history. She has never used smokeless tobacco. She reports current alcohol use of about 4.0 standard drinks of alcohol per week. She reports that she does not use drugs.    ROS Review of Systems  Constitutional: Negative.   HENT: Negative.   Eyes: Negative for visual disturbance.  Respiratory: Negative for shortness of breath.   Cardiovascular: Negative for chest pain.  Gastrointestinal: Negative for abdominal pain.  Musculoskeletal: Negative for arthralgias.    Objective:  BP 125/72   Pulse 75   Temp 98.1 F (36.7 C) (Temporal)   Ht 5\' 9"  (1.753 m)   Wt 190 lb 9.6 oz (86.5 kg)   BMI 28.15 kg/m   BP Readings from Last 3 Encounters:  04/19/20 125/72  01/29/20 140/80  01/26/20 (!) 179/86    Wt Readings from Last 3 Encounters:  04/19/20 190 lb 9.6 oz (86.5 kg)  01/26/20 186 lb 4 oz (84.5 kg)  10/21/19 176 lb 3.2 oz (79.9 kg)     Physical Exam Constitutional:      General: She is not in acute distress.    Appearance: She is well-developed and well-nourished.  Cardiovascular:     Rate and Rhythm: Normal rate and regular rhythm.  Pulmonary:     Breath sounds: Normal breath sounds.  Skin:  General: Skin is warm and dry.  Neurological:     Mental Status: She is alert and oriented to person, place, and time.  Psychiatric:        Mood and Affect: Mood and affect normal.       Assessment & Plan:   Brittany Mercer was seen today for medical management of chronic issues.  Diagnoses and all orders for this visit:  Idiopathic peripheral neuropathy -     Tapentadol HCl 100 MG TABS; Take 1 tablet (100 mg total) by mouth 4 (four) times daily. -     Tapentadol HCl 100 MG TABS; Take 1 tablet (100 mg  total) by mouth 4 (four) times daily. -     Tapentadol HCl (NUCYNTA) 100 MG TABS; Take 1 tablet (100 mg total) by mouth 4 (four) times daily. -     ToxASSURE Select 13 (MW), Urine  Other orders -     zaleplon (SONATA) 10 MG capsule; Take 1 capsule (10 mg total) by mouth at bedtime as needed for sleep. -     metoprolol succinate (TOPROL-XL) 50 MG 24 hr tablet; Take with or immediately following a meal.       I have changed Brittany Mercer. Deeg "Brittany Mercer"'s metoprolol succinate. I am also having her maintain her loratadine, ibuprofen, prednisoLONE acetate, potassium chloride, tizanidine, fenofibrate, estradiol, zaleplon, Tapentadol HCl, Tapentadol HCl, and Nucynta.  Allergies as of 04/19/2020      Reactions   Statins Other (See Comments)   Myalgia, turns red   Penicillins Rash, Other (See Comments)   Pt not sure if she is allergic to penicillin or sulfa. So she does not take either one.  DID THE REACTION INVOLVE: Swelling of the face/tongue/throat, SOB, or low BP? Unknown Sudden or severe rash/hives, skin peeling, or the inside of the mouth or nose? No Did it require medical treatment? No When did it last happen?childhood allergy If all above answers are "NO", may proceed with cephalosporin use.   Sulfa Antibiotics Rash, Other (See Comments)   Pt not sure if she is allergic to sulfa or penicillin so she doesn't take either one.       Medication List       Accurate as of April 19, 2020 11:59 PM. If you have any questions, ask your nurse or doctor.        estradiol 0.1 MG/GM vaginal cream Commonly known as: ESTRACE PLACE 1 APPLICATORFUL VAGINALLY ONCE A WEEK. AT BED TIME   fenofibrate 160 MG tablet Take 1 tablet (160 mg total) by mouth daily.   ibuprofen 200 MG tablet Commonly known as: ADVIL Take 600 mg by mouth daily as needed for moderate pain.   loratadine 10 MG tablet Commonly known as: CLARITIN Take 10 mg by mouth daily at 2 PM.   metoprolol succinate 50 MG 24  hr tablet Commonly known as: TOPROL-XL Take with or immediately following a meal. What changed: See the new instructions. Changed by: Claretta Fraise, MD   potassium chloride 10 MEQ tablet Commonly known as: KLOR-CON TAKE 2 TO 3 TABLETS BY MOUTH DAILY AS DIRECTED   prednisoLONE acetate 1 % ophthalmic suspension Commonly known as: PRED FORTE Place 1 drop into the right eye 4 (four) times daily.   Nucynta 100 MG Tabs Generic drug: Tapentadol HCl Take 1 tablet (100 mg total) by mouth 4 (four) times daily. What changed: Another medication with the same name was changed. Make sure you understand how and when to take each. Changed by: Claretta Fraise,  MD   Tapentadol HCl 100 MG Tabs Take 1 tablet (100 mg total) by mouth 4 (four) times daily. Start taking on: May 20, 2020 What changed: These instructions start on May 20, 2020. If you are unsure what to do until then, ask your doctor or other care provider. Changed by: Claretta Fraise, MD   Tapentadol HCl 100 MG Tabs Take 1 tablet (100 mg total) by mouth 4 (four) times daily. Start taking on: June 19, 2020 What changed: These instructions start on June 19, 2020. If you are unsure what to do until then, ask your doctor or other care provider. Changed by: Claretta Fraise, MD   tizanidine 6 MG capsule Commonly known as: ZANAFLEX TAKE 1 CAPSULE (6 MG TOTAL) BY MOUTH 3 (THREE) TIMES DAILY AS NEEDED FOR MUSCLE SPASMS.   zaleplon 10 MG capsule Commonly known as: SONATA Take 1 capsule (10 mg total) by mouth at bedtime as needed for sleep.        Follow-up: Return in about 3 months (around 07/18/2020).  Claretta Fraise, M.D.

## 2020-04-27 LAB — TOXASSURE SELECT 13 (MW), URINE

## 2020-04-29 ENCOUNTER — Other Ambulatory Visit: Payer: Self-pay

## 2020-04-29 ENCOUNTER — Ambulatory Visit (INDEPENDENT_AMBULATORY_CARE_PROVIDER_SITE_OTHER): Payer: Medicare Other | Admitting: Family Medicine

## 2020-04-29 ENCOUNTER — Encounter: Payer: Self-pay | Admitting: Family Medicine

## 2020-04-29 VITALS — BP 132/72 | HR 66 | Temp 97.7°F | Ht 69.0 in | Wt 188.6 lb

## 2020-04-29 DIAGNOSIS — E782 Mixed hyperlipidemia: Secondary | ICD-10-CM | POA: Diagnosis not present

## 2020-04-29 DIAGNOSIS — I10 Essential (primary) hypertension: Secondary | ICD-10-CM

## 2020-04-29 DIAGNOSIS — F1120 Opioid dependence, uncomplicated: Secondary | ICD-10-CM | POA: Diagnosis not present

## 2020-04-29 DIAGNOSIS — G609 Hereditary and idiopathic neuropathy, unspecified: Secondary | ICD-10-CM

## 2020-04-29 DIAGNOSIS — Z0000 Encounter for general adult medical examination without abnormal findings: Secondary | ICD-10-CM

## 2020-04-29 MED ORDER — ZALEPLON 10 MG PO CAPS: 10.0000 mg | ORAL_CAPSULE | Freq: Every evening | ORAL | 5 refills | Status: DC | PRN

## 2020-04-29 NOTE — Progress Notes (Signed)
Subjective:  Patient ID: Brittany Mercer, female    DOB: 1946/02/08  Age: 75 y.o. MRN: [id number]  CC: Annual Exam   HPI Brittany Mercer presents for Annual exam. Concerned for aching with some cramping in legs at night  Depression screen [facility] 2/9 04/29/2020 04/19/2020 01/26/2020  Decreased Interest 0 0 0  Down, Depressed, Hopeless 0 0 0  PHQ - 2 Score 0 0 0  Altered sleeping - - -  Tired, decreased energy - - -  Change in appetite - - -  Feeling bad or failure about yourself  - - -  Trouble concentrating - - -  Moving slowly or fidgety/restless - - -  Suicidal thoughts - - -  PHQ-9 Score - - -  Difficult doing work/chores - - -  Some recent data might be hidden    History Brittany Mercer has a past medical history of Cataract, Chronic narcotic use, Headache, Hereditary and idiopathic peripheral neuropathy, Hyperlipidemia, Hypertension, Hypokalemia, OA (osteoarthritis), Peripheral neuropathy, and Wears glasses.   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   Her family history includes Arthritis in her brother; Dementia in her father; Heart disease in her mother; Hyperlipidemia in her mother; Hypertension in her brother, father, and mother; Neuropathy in her father; Stroke in her mother.She reports that she quit smoking about 21 years ago. Her smoking use included cigarettes. She has a 15.00 pack-year smoking history. She has never used smokeless tobacco. She reports current alcohol use of about 4.0 standard drinks of alcohol per week. She reports that she does not use  drugs.    ROS Review of Systems  Constitutional: Negative.   HENT: Negative for congestion.   Eyes: Negative for visual disturbance.  Respiratory: Negative for shortness of breath.   Cardiovascular: Negative for chest pain.  Gastrointestinal: Negative for abdominal pain, constipation, diarrhea, nausea and vomiting.  Genitourinary: Negative for difficulty urinating.  Musculoskeletal: Negative for arthralgias and myalgias.  Neurological: Negative for headaches.       Neuralgia persists. She has severe burning in the legs.   Psychiatric/Behavioral: Negative for sleep disturbance.    Objective:  BP 132/72   Pulse 66   Temp 97.7 F (36.5 C) (Temporal)   Ht _0  (1.753 m)   Wt 188 lb 9.6 oz (85.5 kg)   BMI 27.85 kg/m   BP Readings from Last 3 Encounters:  04/29/20 132/72  04/19/20 125/72  01/29/20 140/80    Wt Readings from Last 3 Encounters:  04/29/20 188 lb 9.6 oz (85.5 kg)  04/19/20 190 lb 9.6 oz (86.5 kg)  01/26/20 186 lb 4 oz (84.5 kg)     Physical Exam Constitutional:      General: She is not in acute distress.    Appearance: She is well-developed.  HENT:     Head: Normocephalic and atraumatic.  Eyes:     Conjunctiva/sclera: Conjunctivae normal.     Pupils: Pupils are equal, round, and reactive to light.  Neck:     Thyroid: No thyromegaly.  Cardiovascular:     Rate and Rhythm: Normal rate and regular rhythm.     Heart sounds: Normal heart sounds. No murmur heard.  Pulmonary:     Effort: Pulmonary effort is normal. No respiratory distress.     Breath sounds: Normal breath sounds. No wheezing or rales.  Abdominal:     General: Bowel sounds are normal. There is no distension.     Palpations: Abdomen is soft.     Tenderness: There is no abdominal tenderness.  Musculoskeletal:        General: Normal range of motion.     Cervical back: Normal range of motion and neck supple.  Lymphadenopathy:     Cervical: No cervical adenopathy.  Skin:    General:  Skin is warm and dry.  Neurological:     Mental Status: She is alert and oriented to person, place, and time.  Psychiatric:        Behavior: Behavior normal.        Thought Content: Thought content normal.        Judgment: Judgment normal.       Assessment & Plan:   Samariah was seen today for annual exam.  Diagnoses and all orders for this visit:  Well adult exam -     CBC with Differential/Platelet -     CMP14+EGFR -     Lipid panel  Mixed hyperlipidemia -     CBC with Differential/Platelet -     CMP14+EGFR -     Lipid panel  Essential hypertension, benign -     CBC with Differential/Platelet -     CMP14+EGFR  Idiopathic peripheral neuropathy -     CBC with Differential/Platelet -     HYI50+YDXA  Uncomplicated opioid dependence ([place])  Other orders -     zaleplon (SONATA) 10 MG capsule; Take 1 capsule (10 mg total) by mouth at bedtime as needed for sleep.       I have discontinued Pamelyn Bancroft. Zarcone "Trish"'s tizanidine. I am also having her maintain her loratadine, ibuprofen, prednisoLONE acetate, potassium chloride, fenofibrate, estradiol, Tapentadol HCl, Tapentadol HCl, Nucynta, metoprolol succinate, and zaleplon.  Allergies as of 04/29/2020      Reactions   Statins Other (See Comments)   Myalgia, turns red   Penicillins Rash, Other (See Comments)   Pt not sure if she is allergic to penicillin or sulfa. So she does not take either one.  DID THE REACTION INVOLVE: Swelling of the face/tongue/throat, SOB, or low BP? Unknown Sudden or severe rash/hives, skin peeling, or the inside of the mouth or nose? No Did it require medical treatment? No When did it last happen?childhood allergy If all above answers are "NO", may proceed with cephalosporin use.   Sulfa Antibiotics Rash, Other (See Comments)   Pt not sure if she is allergic to sulfa or penicillin so she doesn't take either one.       Medication List       Accurate as of April 29, 2020 11:59  PM. If you have any questions, ask your nurse or doctor.        STOP taking these medications   tizanidine 6 MG capsule Commonly known as: ZANAFLEX Stopped by: Claretta Fraise, MD     TAKE these medications   estradiol 0.1 MG/GM vaginal cream Commonly known as: ESTRACE PLACE 1 APPLICATORFUL VAGINALLY ONCE A WEEK. AT BED TIME   fenofibrate 160 MG tablet Take 1 tablet (160 mg total) by mouth daily.   ibuprofen 200 MG tablet Commonly known as: ADVIL Take 600 mg by mouth daily as needed for moderate pain.   loratadine 10 MG tablet Commonly known  as: CLARITIN Take 10 mg by mouth daily at 2 PM.   metoprolol succinate 50 MG 24 hr tablet Commonly known as: TOPROL-XL Take with or immediately following a meal.   potassium chloride 10 MEQ tablet Commonly known as: KLOR-CON TAKE 2 TO 3 TABLETS BY MOUTH DAILY AS DIRECTED   prednisoLONE acetate 1 % ophthalmic suspension Commonly known as: PRED FORTE Place 1 drop into the right eye 4 (four) times daily.   Nucynta 100 MG Tabs Generic drug: Tapentadol HCl Take 1 tablet (100 mg total) by mouth 4 (four) times daily.   Tapentadol HCl 100 MG Tabs Take 1 tablet (100 mg total) by mouth 4 (four) times daily. Start taking on: May 20, 2020   Tapentadol HCl 100 MG Tabs Take 1 tablet (100 mg total) by mouth 4 (four) times daily. Start taking on: June 19, 2020   zaleplon 10 MG capsule Commonly known as: SONATA Take 1 capsule (10 mg total) by mouth at bedtime as needed for sleep.        Follow-up: Return in about 3 months (around 07/27/2020).  Claretta Fraise, M.D.

## 2020-05-02 ENCOUNTER — Encounter: Payer: Self-pay | Admitting: Family Medicine

## 2020-05-04 ENCOUNTER — Other Ambulatory Visit: Payer: Medicare Other

## 2020-05-04 ENCOUNTER — Other Ambulatory Visit: Payer: Self-pay

## 2020-05-04 DIAGNOSIS — E782 Mixed hyperlipidemia: Secondary | ICD-10-CM | POA: Diagnosis not present

## 2020-05-04 DIAGNOSIS — I10 Essential (primary) hypertension: Secondary | ICD-10-CM | POA: Diagnosis not present

## 2020-05-04 DIAGNOSIS — G609 Hereditary and idiopathic neuropathy, unspecified: Secondary | ICD-10-CM | POA: Diagnosis not present

## 2020-05-04 DIAGNOSIS — Z0000 Encounter for general adult medical examination without abnormal findings: Secondary | ICD-10-CM | POA: Diagnosis not present

## 2020-05-05 LAB — CMP14+EGFR
ALT: 17 IU/L (ref 0–32)
AST: 21 IU/L (ref 0–40)
Albumin/Globulin Ratio: 2.9 — ABNORMAL HIGH (ref 1.2–2.2)
Albumin: 4.6 g/dL (ref 3.7–4.7)
Alkaline Phosphatase: 54 IU/L (ref 44–121)
BUN/Creatinine Ratio: 19 (ref 12–28)
BUN: 20 mg/dL (ref 8–27)
Bilirubin Total: 0.7 mg/dL (ref 0.0–1.2)
CO2: 25 mmol/L (ref 20–29)
Calcium: 9.8 mg/dL (ref 8.7–10.3)
Chloride: 103 mmol/L (ref 96–106)
Creatinine, Ser: 1.03 mg/dL — ABNORMAL HIGH (ref 0.57–1.00)
GFR calc Af Amer: 62 mL/min/{1.73_m2} (ref >59)
GFR calc non Af Amer: 54 mL/min/{1.73_m2} — ABNORMAL LOW (ref >59)
Globulin, Total: 1.6 g/dL (ref 1.5–4.5)
Glucose: 83 mg/dL (ref 65–99)
Potassium: 4.6 mmol/L (ref 3.5–5.2)
Sodium: 142 mmol/L (ref 134–144)
Total Protein: 6.2 g/dL (ref 6.0–8.5)

## 2020-05-05 LAB — CBC WITH DIFFERENTIAL/PLATELET
Basophils Absolute: 0 10*3/uL (ref 0.0–0.2)
Basos: 1 %
EOS (ABSOLUTE): 0.3 10*3/uL (ref 0.0–0.4)
Eos: 6 %
Hematocrit: 40.8 % (ref 34.0–46.6)
Hemoglobin: 14.5 g/dL (ref 11.1–15.9)
Immature Grans (Abs): 0 10*3/uL (ref 0.0–0.1)
Immature Granulocytes: 0 %
Lymphocytes Absolute: 1.3 10*3/uL (ref 0.7–3.1)
Lymphs: 26 %
MCH: 32.1 pg (ref 26.6–33.0)
MCHC: 35.5 g/dL (ref 31.5–35.7)
MCV: 90 fL (ref 79–97)
Monocytes Absolute: 0.5 10*3/uL (ref 0.1–0.9)
Monocytes: 10 %
Neutrophils Absolute: 2.9 10*3/uL (ref 1.4–7.0)
Neutrophils: 57 %
Platelets: 139 10*3/uL — ABNORMAL LOW (ref 150–450)
RBC: 4.52 x10E6/uL (ref 3.77–5.28)
RDW: 11.9 % (ref 11.7–15.4)
WBC: 5.1 10*3/uL (ref 3.4–10.8)

## 2020-05-05 LAB — LIPID PANEL
Chol/HDL Ratio: 4.2 ratio (ref 0.0–4.4)
Cholesterol, Total: 182 mg/dL (ref 100–199)
HDL: 43 mg/dL (ref >39)
LDL Chol Calc (NIH): 121 mg/dL — ABNORMAL HIGH (ref 0–99)
Triglycerides: 100 mg/dL (ref 0–149)
VLDL Cholesterol Cal: 18 mg/dL (ref 5–40)

## 2020-05-05 NOTE — Addendum Note (Signed)
Addended by: Claretta Fraise on: 05/05/2020 05:31 PM   Modules accepted: Level of Service

## 2020-05-25 ENCOUNTER — Encounter: Payer: Self-pay | Admitting: Family Medicine

## 2020-05-25 ENCOUNTER — Ambulatory Visit (INDEPENDENT_AMBULATORY_CARE_PROVIDER_SITE_OTHER): Payer: Medicare Other | Admitting: Family Medicine

## 2020-05-25 ENCOUNTER — Ambulatory Visit (INDEPENDENT_AMBULATORY_CARE_PROVIDER_SITE_OTHER): Payer: Medicare Other

## 2020-05-25 ENCOUNTER — Other Ambulatory Visit: Payer: Self-pay

## 2020-05-25 VITALS — BP 131/80 | HR 76 | Temp 97.0°F | Resp 20 | Ht 69.0 in | Wt 188.2 lb

## 2020-05-25 DIAGNOSIS — M79605 Pain in left leg: Secondary | ICD-10-CM | POA: Diagnosis not present

## 2020-05-25 DIAGNOSIS — M79662 Pain in left lower leg: Secondary | ICD-10-CM | POA: Diagnosis not present

## 2020-05-25 DIAGNOSIS — M1612 Unilateral primary osteoarthritis, left hip: Secondary | ICD-10-CM | POA: Diagnosis not present

## 2020-05-25 MED ORDER — MOMETASONE FUROATE 0.1 % EX CREA: 1.0000 "application " | TOPICAL_CREAM | Freq: Every day | CUTANEOUS | 5 refills | Status: DC

## 2020-05-25 NOTE — Progress Notes (Signed)
Subjective:  Patient ID: Brittany Mercer, female    DOB: 1945-05-02  Age: 75 y.o. MRN: [id number]  CC: No chief complaint on file.   HPI Myriam Brandhorst presents for fell from seated to floor onto a rug. Occurred 4 mos ago. Since has had pain in the left thigh and . It is not in the joints and is an ache. INtermittent and at rest. 6-7/1Not affected by position or motion. She does have lumbar pain as well. It is milder. It started after lifting and moving a couch just a few inches several weeks ago.  Depression screen [facility] 2/9 05/25/2020 04/29/2020 04/19/2020  Decreased Interest 0 0 0  Down, Depressed, Hopeless 0 0 0  PHQ - 2 Score 0 0 0  Altered sleeping - - -  Tired, decreased energy - - -  Change in appetite - - -  Feeling bad or failure about yourself  - - -  Trouble concentrating - - -  Moving slowly or fidgety/restless - - -  Suicidal thoughts - - -  PHQ-9 Score - - -  Difficult doing work/chores - - -  Some recent data might be hidden    History Britanee has a past medical history of Cataract, Chronic narcotic use, Headache, Hereditary and idiopathic peripheral neuropathy, Hyperlipidemia, Hypertension, Hypokalemia, OA (osteoarthritis), Peripheral neuropathy, and Wears glasses.   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   Her family history includes Arthritis in her brother; Dementia in her father; Heart disease in her mother; Hyperlipidemia in her mother; Hypertension in her brother, father, and mother; Neuropathy in her father; Stroke in her mother.She reports  that she quit smoking about 21 years ago. Her smoking use included cigarettes. She has a 15.00 pack-year smoking history. She has never used smokeless tobacco. She reports current alcohol use of about 4.0 standard drinks of alcohol per week. She reports that she does not use drugs.    ROS Review of Systems  Constitutional: Negative for fatigue and fever.  Respiratory: Negative for shortness of breath.   Cardiovascular: Negative for leg swelling.  Musculoskeletal: Positive for arthralgias, back pain and myalgias.    Objective:  BP 131/80   Pulse 76   Temp (!) 97 F (36.1 C) (Temporal)   Resp 20   Ht 5\' 9"  (1.753 m)   Wt 188 lb 4 oz (85.4 kg)   SpO2 100%   BMI 27.80 kg/m   BP Readings from Last 3 Encounters:  05/25/20 131/80  04/29/20 132/72  04/19/20 125/72    Wt Readings from Last 3 Encounters:  05/25/20 188 lb 4 oz (85.4 kg)  04/29/20 188 lb 9.6 oz (85.5 kg)  04/19/20 190 lb 9.6 oz (86.5 kg)     Physical Exam Constitutional:      Appearance: She is well-developed and well-nourished.  HENT:     Head: Normocephalic.  Cardiovascular:     Rate and Rhythm: Normal rate.  Pulmonary:     Effort: Pulmonary effort is normal.  Musculoskeletal:        General: No swelling, tenderness, deformity or signs of injury.  Skin:    General: Skin is warm and dry.  Neurological:  General: No focal deficit present.     Mental Status: She is oriented to person, place, and time.     Motor: No weakness.     Coordination: Coordination normal.    XR - LLE, preliminary read by [facility], M.D. No significant abnormality - L knee replacement in good position. No bony lesion. MIld hip joint arthrosis   Assessment & Plan:   Diagnoses and all orders for this visit:  Leg pain, left -     DG Tibia/Fibula Left; Future -     DG FEMUR MIN 2 VIEWS LEFT; Future  Other orders -     mometasone (ELOCON) 0.1 % cream; Apply 1 application topically daily. To affected areas       I am  having Chardae Mulkern. [place] "Trish" start on mometasone. I am also having her maintain her loratadine, ibuprofen, prednisoLONE acetate, potassium chloride, fenofibrate, estradiol, Tapentadol HCl, Tapentadol HCl, Nucynta, metoprolol succinate, and zaleplon.  Allergies as of 05/25/2020      Reactions   Statins Other (See Comments)   Myalgia, turns red   Penicillins Rash, Other (See Comments)   Pt not sure if she is allergic to penicillin or sulfa. So she does not take either one.  DID THE REACTION INVOLVE: Swelling of the face/tongue/throat, SOB, or low BP? Unknown Sudden or severe rash/hives, skin peeling, or the inside of the mouth or nose? No Did it require medical treatment? No When did it last happen?childhood allergy If all above answers are "NO", may proceed with cephalosporin use.   Sulfa Antibiotics Rash, Other (See Comments)   Pt not sure if she is allergic to sulfa or penicillin so she doesn't take either one.       Medication List       Accurate as of May 25, 2020  1:47 PM. If you have any questions, ask your nurse or doctor.        estradiol 0.1 MG/GM vaginal cream Commonly known as: ESTRACE PLACE 1 APPLICATORFUL VAGINALLY ONCE A WEEK. AT BED TIME   fenofibrate 160 MG tablet Take 1 tablet (160 mg total) by mouth daily.   ibuprofen 200 MG tablet Commonly known as: ADVIL Take 600 mg by mouth daily as needed for moderate pain.   loratadine 10 MG tablet Commonly known as: CLARITIN Take 10 mg by mouth daily at 2 PM.   metoprolol succinate 50 MG 24 hr tablet Commonly known as: TOPROL-XL Take with or immediately following a meal.   mometasone 0.1 % cream Commonly known as: Elocon Apply 1 application topically daily. To affected areas Started by: Claretta Fraise, MD   potassium chloride 10 MEQ tablet Commonly known as: KLOR-CON TAKE 2 TO 3 TABLETS BY MOUTH DAILY AS DIRECTED   prednisoLONE acetate 1 % ophthalmic suspension Commonly known as: PRED FORTE Place  1 drop into the right eye 4 (four) times daily.   Nucynta 100 MG Tabs Generic drug: Tapentadol HCl Take 1 tablet (100 mg total) by mouth 4 (four) times daily.   Tapentadol HCl 100 MG Tabs Take 1 tablet (100 mg total) by mouth 4 (four) times daily.   Tapentadol HCl 100 MG Tabs Take 1 tablet (100 mg total) by mouth 4 (four) times daily. Start taking on: June 19, 2020   zaleplon 10 MG capsule Commonly known as: SONATA Take 1 capsule (10 mg total) by mouth at bedtime as needed for sleep.        Follow-up: No follow-ups on file.  Claretta Fraise, M.D.

## 2020-05-31 ENCOUNTER — Other Ambulatory Visit: Payer: Self-pay

## 2020-05-31 ENCOUNTER — Ambulatory Visit (HOSPITAL_BASED_OUTPATIENT_CLINIC_OR_DEPARTMENT_OTHER)
Admission: RE | Admit: 2020-05-31 | Discharge: 2020-05-31 | Disposition: A | Discharge status: Home / Self Care | Payer: Medicare Other | Source: Ambulatory Visit | Attending: Family Medicine | Admitting: Family Medicine

## 2020-05-31 DIAGNOSIS — M79661 Pain in right lower leg: Secondary | ICD-10-CM | POA: Diagnosis not present

## 2020-05-31 DIAGNOSIS — M79605 Pain in left leg: Secondary | ICD-10-CM | POA: Diagnosis not present

## 2020-06-23 DIAGNOSIS — H2512 Age-related nuclear cataract, left eye: Secondary | ICD-10-CM | POA: Diagnosis not present

## 2020-06-23 DIAGNOSIS — Z947 Corneal transplant status: Secondary | ICD-10-CM | POA: Diagnosis not present

## 2020-06-23 DIAGNOSIS — H18513 Endothelial corneal dystrophy, bilateral: Secondary | ICD-10-CM | POA: Diagnosis not present

## 2020-06-23 DIAGNOSIS — Z961 Presence of intraocular lens: Secondary | ICD-10-CM | POA: Diagnosis not present

## 2020-07-16 ENCOUNTER — Ambulatory Visit (INDEPENDENT_AMBULATORY_CARE_PROVIDER_SITE_OTHER): Payer: Medicare Other

## 2020-07-16 VITALS — Wt 186.0 lb

## 2020-07-16 DIAGNOSIS — Z0000 Encounter for general adult medical examination without abnormal findings: Secondary | ICD-10-CM | POA: Diagnosis not present

## 2020-07-16 NOTE — Progress Notes (Signed)
Subjective:   Brittany Mercer is a 75 y.o. female who presents for Medicare Annual (Subsequent) preventive examination.  Virtual Visit via Telephone Note  I connected with  [name] on 07/16/20 at  4:15 PM EDT by telephone and verified that I am speaking with the correct person using two identifiers.  Location: Patient: Home Provider: [facility] Persons participating in the virtual visit: patient/Nurse Health Advisor   I discussed the limitations, risks, security and privacy concerns of performing an evaluation and management service by telephone and the availability of in person appointments. The patient expressed understanding and agreed to proceed.  Interactive audio and video telecommunications were attempted between this nurse and patient, however failed, due to patient having technical difficulties OR patient did not have access to video capability.  We continued and completed visit with audio only.  Some vital signs may be absent or patient reported.   Brittany Mercer E Brittany Nawabi, LPN   Review of Systems     Cardiac Risk Factors include: advanced age (>29men, >89 women);dyslipidemia;hypertension     Objective:    Today's Vitals   07/16/20 1627  Weight: 186 lb (84.4 kg)   Body mass index is 27.47 kg/m.  Advanced Directives 07/16/2020 06/09/2019 06/06/2018 04/02/2018 02/05/2018 01/28/2018 05/14/2017  Does Patient Have a Medical Advance Directive? Yes Yes Yes Yes Yes Yes No  Type of Paramedic of [place];Living will [place];Living will [place];Living will - [place];Living will [place];Living will -  Does patient want to make changes to medical advance directive? - No - Patient declined No - Patient declined - No - Patient declined - -  Copy of [place] in Chart? No - copy requested No - copy requested No - copy requested - No - copy requested No  - copy requested -    Current Medications (verified) Outpatient Encounter Medications as of 07/16/2020  Medication Sig  . estradiol (ESTRACE) 0.1 MG/GM vaginal cream PLACE 1 APPLICATORFUL VAGINALLY ONCE A WEEK. AT BED TIME  . fenofibrate 160 MG tablet Take 1 tablet (160 mg total) by mouth daily.  Marland Kitchen ibuprofen (ADVIL,MOTRIN) 200 MG tablet Take 600 mg by mouth daily as needed for moderate pain.  Marland Kitchen loratadine (CLARITIN) 10 MG tablet Take 10 mg by mouth daily at 2 PM.  . metoprolol succinate (TOPROL-XL) 50 MG 24 hr tablet Take with or immediately following a meal.  . mometasone (ELOCON) 0.1 % cream Apply 1 application topically daily. To affected areas  . potassium chloride (KLOR-CON) 10 MEQ tablet TAKE 2 TO 3 TABLETS BY MOUTH DAILY AS DIRECTED  . prednisoLONE acetate (PRED FORTE) 1 % ophthalmic suspension Place 1 drop into the right eye 4 (four) times daily.  . Tapentadol HCl 100 MG TABS Take 1 tablet (100 mg total) by mouth 4 (four) times daily.  . zaleplon (SONATA) 10 MG capsule Take 1 capsule (10 mg total) by mouth at bedtime as needed for sleep.  . [DISCONTINUED] Tapentadol HCl (NUCYNTA) 100 MG TABS Take 1 tablet (100 mg total) by mouth 4 (four) times daily.  . [DISCONTINUED] Tapentadol HCl 100 MG TABS Take 1 tablet (100 mg total) by mouth 4 (four) times daily.   No facility-administered encounter medications on file as of 07/16/2020.    Allergies (verified) Statins, Penicillins, and Sulfa antibiotics   History: Past Medical History:  Diagnosis Date  . Cataract   . Chronic narcotic use    takes  Nucynta for neuropathic pain  . Headache   . Hereditary and idiopathic peripheral neuropathy    followed by pcp  . Hyperlipidemia   . Hypertension   . Hypokalemia   . OA (osteoarthritis)    LEFT KNEE  . Peripheral neuropathy   . Wears glasses    Past Surgical History:  Procedure Laterality Date  . ABDOMINAL HYSTERECTOMY  1980s   W/ UNILATERAL SALPINGOOPHORECTOMY  . CATARACT  EXTRACTION Right   . CESAREAN SECTION  x2   last one 65  . 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 EYE SURGERY    . KNEE ARTHROSCOPY Right x2  last one 2016  . KNEE CLOSED REDUCTION Left 04/11/2018   Procedure: CLOSED MANIPULATION KNEE;  Surgeon: Paralee Cancel, MD;  Location: [facility];  Service: Orthopedics;  Laterality: Left;  . SHOULDER OPEN ROTATOR CUFF REPAIR Left 1990s  . TONSILLECTOMY  age 21  . 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 TOTAL KNEE ARTHROPLASTY Left 02/05/2018   Procedure: LEFT TOTAL KNEE ARTHROPLASTY;  Surgeon: Paralee Cancel, MD;  Location: [facility];  Service: Orthopedics;  Laterality: Left;  70 mins   Family History  Problem Relation Age of Onset  . Hypertension Mother   . Hyperlipidemia Mother   . Stroke Mother   . Heart disease Mother   . Hypertension Father   . Dementia Father   . Neuropathy Father   . Hypertension Brother   . Arthritis Brother    Social History   Socioeconomic History  . Marital status: Widowed    Spouse name: Not on file  . Number of children: 2  . Years of education: college  . Highest education level: Bachelor's degree (e.g., BA, AB, BS)  Occupational History  . Occupation: Retired  . Occupation: Engineer, site  Tobacco Use  . Smoking status: Former Smoker    Packs/day: 0.50    Years: 30.00    Pack years: 15.00    Types: Cigarettes    Quit date: 07/18/1998    Years since quitting: 22.0  . Smokeless tobacco: Never Used  Vaping Use  . Vaping Use: Never used  Substance and Sexual Activity  . Alcohol use: Yes    Alcohol/week: 4.0 standard drinks    Types: 4 Glasses of wine per week    Comment: per year  . Drug use: Never  . Sexual activity: Not Currently    Birth control/protection: Surgical  Other Topics Concern  . Not on file  Social History Narrative   Lives alone. Has 2 adult  sons and 4 grandchildren.   Social Determinants of Health   Financial Resource Strain: Low Risk   . Difficulty of Paying Living Expenses: Not hard at all  Food Insecurity: No Food Insecurity  . Worried About Charity fundraiser in the Last Year: Never true  . Ran Out of Food in the Last Year: Never true  Transportation Needs: No Transportation Needs  . Lack of Transportation (Medical): No  . Lack of Transportation (Non-Medical): No  Physical Activity: Inactive  . Days of Exercise per Week: 0 days  . Minutes of Exercise per Session: 0 min  Stress: No Stress Concern Present  . Feeling of Stress : Not at all  Social Connections: Moderately Isolated  . Frequency of Communication with Friends and Family: More than three times a week  . Frequency of  Social Gatherings with Friends and Family: More than three times a week  . Attends Religious Services: More than 4 times per year  . Active Member of Clubs or Organizations: No  . Attends Archivist Meetings: Never  . Marital Status: Widowed    Tobacco Counseling Counseling given: Not Answered   Clinical Intake:  Pre-visit preparation completed: Yes  Pain : No/denies pain     BMI - recorded: 27.47 Nutritional Status: BMI 25 -29 Overweight Nutritional Risks: None Diabetes: No  How often do you need to have someone help you when you read instructions, pamphlets, or other written materials from your doctor or pharmacy?: 1 - Never  Diabetic? No  Interpreter Needed?: No  Information entered by :: Carmelia Tiner, LPN   Activities of Daily Living In your present state of health, do you have any difficulty performing the following activities: 07/16/2020  Hearing? Y  Vision? N  Difficulty concentrating or making decisions? Y  Walking or climbing stairs? N  Comment is very careful due to neuropathy  Dressing or bathing? N  Doing errands, shopping? N  Preparing Food and eating ? N  Using the Toilet? N  In the past six  months, have you accidently leaked urine? N  Do you have problems with loss of bowel control? N  Managing your Medications? N  Managing your Finances? N  Housekeeping or managing your Housekeeping? N  Some recent data might be hidden    Patient Care Team: Claretta Fraise, MD as PCP - General (Family Medicine) Paralee Cancel, MD as Consulting Physician (Orthopedic Surgery) Sandford Craze, MD as Referring Physician (Dermatology) Marilynne Halsted, MD as Referring Physician (Ophthalmology)  Indicate any recent Medical Services you may have received from other than [facility] providers in the past year (date may be approximate).     Assessment:   This is a routine wellness examination for Brittany Mercer.  Hearing/Vision screen  Hearing Screening   125Hz  250Hz  500Hz  1000Hz  2000Hz  3000Hz  4000Hz  6000Hz  8000Hz   Right ear:           Left ear:           Comments: Hearing aids from [employer] - doesn't have regular f/u appts, doesn't always wear her hearing aids.  Vision Screening Comments: Annual visits with Dr Susa Simmonds - up to date with eye exam - wears eyeglasses.  Dietary issues and exercise activities discussed: Current Exercise Habits: The patient does not participate in regular exercise at present, Exercise limited by: orthopedic condition(s)  Goals    . AWV     06/09/2019 AWV Goal: Fall Prevention  . Over the next year, patient will decrease their risk for falls by: o Using assistive devices, such as a cane or walker, as needed o Identifying fall risks within their home and correcting them by: - Removing throw rugs - Adding handrails to stairs or ramps - Removing clutter and keeping a clear pathway throughout the home - Increasing light, especially at night - Adding shower handles/bars - Raising toilet seat o Identifying potential personal risk factors for falls: - Medication side effects - Incontinence/urgency - Vestibular dysfunction - Hearing loss - Musculoskeletal  disorders - Neurological disorders - Orthostatic hypotension      . Patient Stated     Work with Physiological scientist on [employer] training and flexibility.      Depression Screen PHQ 2/9 Scores 07/16/2020 05/25/2020 04/29/2020 04/19/2020 01/26/2020 10/21/2019 08/26/2019  PHQ - 2 Score 0 0 0 0 0 0 0  PHQ- 9 Score - - - - - - -  Exception Documentation - - - - - - -    Fall Risk Fall Risk  07/16/2020 05/25/2020 04/29/2020 04/19/2020 01/26/2020  Falls in the past year? 0 1 0 0 1  Number falls in past yr: 0 0 - 0 0  Injury with Fall? 0 0 - 0 0  Comment - - - - -  Risk Factor Category  - - - - -  Risk for fall due to : Orthopedic patient History of fall(s) No Fall Risks No Fall Risks History of fall(s)  Follow up Falls prevention discussed Falls evaluation completed Falls evaluation completed Falls evaluation completed Falls evaluation completed    [place]:  Any stairs in or around the home? No  If so, are there any without handrails? No  Home free of loose throw rugs in walkways, pet beds, electrical cords, etc? Yes  Adequate lighting in your home to reduce risk of falls? Yes   ASSISTIVE DEVICES UTILIZED TO PREVENT FALLS:  Life alert? yes Use of a cane, walker or w/c? No  Grab bars in the bathroom? Yes  Shower chair or bench in shower? Yes  Elevated toilet seat or a handicapped toilet? No   TIMED UP AND GO:  Was the test performed? No . Telephonic visit  Cognitive Function: Normal cognitive status assessed by direct observation by this Nurse Health Advisor. No abnormalities found.   MMSE - Mini Mental State Exam 06/06/2018  Orientation to time 5  Orientation to Place 5  Registration 3  Attention/ Calculation 5  Recall 3  Language- name 2 objects 2  Language- repeat 1  Language- follow 3 step command 3  Language- read & follow direction 1  Write a sentence 1  Copy design 1  Total score 30     6CIT Screen 07/16/2020 06/09/2019  What Year? 0 points 0  points  What month? 0 points 0 points  What time? 0 points 0 points  Count back from 20 0 points 0 points  Months in reverse 0 points 0 points  Repeat phrase 0 points 0 points  Total Score 0 0    Immunizations Immunization History  Administered Date(s) Administered  . Fluad Quad(high Dose 65+) 01/22/2020  . Influenza, High Dose Seasonal PF 02/12/2016, 12/31/2018  . Influenza,inj,Quad PF,6+ Mos 02/17/2013, 12/31/2013, 12/21/2014  . Influenza,inj,quad, With Preservative 11/08/2017  . Influenza-Unspecified 01/15/2017, 11/08/2017  . PFIZER(Purple Top)SARS-COV-2 Vaccination 05/03/2019, 05/31/2019, 01/22/2020  . Pneumococcal Polysaccharide-23 07/17/2012  . Tdap 05/14/2017  . Zoster 02/12/2014  . Zoster Recombinat (Shingrix) 06/06/2018    TDAP status: Up to date  Flu Vaccine status: Up to date  Pneumococcal vaccine status: Up to date  Covid-19 vaccine status: Completed vaccines  Qualifies for Shingles Vaccine? Yes   Zostavax completed Yes   Shingrix Completed?: No.    Education has been provided regarding the importance of this vaccine. Patient has been advised to call insurance company to determine out of pocket expense if they have not yet received this vaccine. Advised may also receive vaccine at local pharmacy or Health Dept. Verbalized acceptance and understanding.  Screening Tests Health Maintenance  Topic Date Due  . DEXA SCAN  01/01/2019  . COLONOSCOPY (Pts 45-63yrs Insurance coverage will need to be confirmed)  06/25/2020  . COLON CANCER SCREENING ANNUAL FOBT  10/17/2020 (Originally 06/15/2015)  . MAMMOGRAM  09/16/2020  . INFLUENZA VACCINE  10/25/2020  . TETANUS/TDAP  05/15/2027  . COVID-19 Vaccine  Completed  . Hepatitis C Screening  Completed  . PNA vac Low Risk Adult  Completed  . HPV VACCINES  Aged Out  . PAP SMEAR-Modifier  Discontinued    Health Maintenance  Health Maintenance Due  Topic Date Due  . DEXA SCAN  01/01/2019  . COLONOSCOPY (Pts 45-74yrs  Insurance coverage will need to be confirmed)  06/25/2020    Colorectal cancer screening: Type of screening: Colonoscopy. Completed 06/26/2010. Repeat every 10 years  Mammogram status: Completed 09/17/2019. Repeat every year  Bone Density status: Completed 12/31/2013. Results reflect: Bone density results: NORMAL. Repeat every 5 years.  Lung Cancer Screening: (Low Dose CT Chest recommended if Age 33-80 years, 30 pack-year currently smoking OR have quit w/in 15years.) does not qualify.   Additional Screening:  Hepatitis C Screening: does qualify; Completed 10/30/2018  Vision Screening: Recommended annual ophthalmology exams for early detection of glaucoma and other disorders of the eye. Is the patient up to date with their annual eye exam?  Yes  Who is the provider or what is the name of the office in which the patient attends annual eye exams? [facility] If pt is not established with a provider, would they like to be referred to a provider to establish care? No .   Dental Screening: Recommended annual dental exams for proper oral hygiene  Community Resource Referral / Chronic Care Management: CRR required this visit?  No   CCM required this visit?  No      Plan:     I have personally reviewed and noted the following in the patient's chart:   . Medical and social history . Use of alcohol, tobacco or illicit drugs  . Current medications and supplements . Functional ability and status . Nutritional status . Physical activity . Advanced directives . List of other physicians . Hospitalizations, surgeries, and ER visits in previous 12 months . Vitals . Screenings to include cognitive, depression, and falls . Referrals and appointments  In addition, I have reviewed and discussed with patient certain preventive protocols, quality metrics, and best practice recommendations. A written personalized care plan for preventive services as well as general preventive health recommendations  were provided to patient.     Sandrea Hammond, LPN   [id number]   Nurse Notes: She is due for DEXA, Colonoscopy and Shingrix #2 - Would like to wait and discuss with Dr Livia Snellen at Monday visit.

## 2020-07-16 NOTE — Patient Instructions (Signed)
Brittany Mercer , Thank you for taking time to come for your Medicare Wellness Visit. I appreciate your ongoing commitment to your health goals. Please review the following plan we discussed and let me know if I can assist you in the future.   Screening recommendations/referrals: Colonoscopy: Done 06/26/2010 - Repeat in 10 years *Due now* Mammogram: Done 09/17/2019 - Repeat every year Bone Density: Done 12/31/2013 - Repeat every 5 years *Due now* Recommended yearly ophthalmology/optometry visit for glaucoma screening and checkup Recommended yearly dental visit for hygiene and checkup  Vaccinations: Influenza vaccine: Done 01/22/2020 - repeat every fall Pneumococcal vaccine: Pneumovax-23 Done 07/17/2012; *Due for Prevnar-13* Tdap vaccine: Done 05/14/2017 - Repeat every 10 years Shingles vaccine: Zostavax done 02/12/2014; first shingrix done 06/06/2018 *Due for 2nd dose*   Covid-19: Done 05/03/2019, 05/31/2019, & 01/22/2020  Advanced directives: Please bring a copy of your health care power of attorney and living will to the office to be added to your chart at your convenience.  Conditions/risks identified: Aim for 30 minutes of exercise or brisk walking each day, drink 6-8 glasses of water and eat lots of fruits and vegetables.   Next appointment: Follow up in one year for your annual wellness visit    Preventive Care 65 Years and Older, Female Preventive care refers to lifestyle choices and visits with your health care provider that can promote health and wellness. What does preventive care include?  A yearly physical exam. This is also called an annual well check.  Dental exams once or twice a year.  Routine eye exams. Ask your health care provider how often you should have your eyes checked.  Personal lifestyle choices, including:  Daily care of your teeth and gums.  Regular physical activity.  Eating a healthy diet.  Avoiding tobacco and drug use.  Limiting alcohol use.  Practicing  safe sex.  Taking low-dose aspirin every day.  Taking vitamin and mineral supplements as recommended by your health care provider. What happens during an annual well check? The services and screenings done by your health care provider during your annual well check will depend on your age, overall health, lifestyle risk factors, and family history of disease. Counseling  Your health care provider may ask you questions about your:  Alcohol use.  Tobacco use.  Drug use.  Emotional well-being.  Home and relationship well-being.  Sexual activity.  Eating habits.  History of falls.  Memory and ability to understand (cognition).  Work and work Statistician.  Reproductive health. Screening  You may have the following tests or measurements:  Height, weight, and BMI.  Blood pressure.  Lipid and cholesterol levels. These may be checked every 5 years, or more frequently if you are over 80 years old.  Skin check.  Lung cancer screening. You may have this screening every year starting at age 49 if you have a 30-pack-year history of smoking and currently smoke or have quit within the past 15 years.  Fecal occult blood test (FOBT) of the stool. You may have this test every year starting at age 42.  Flexible sigmoidoscopy or colonoscopy. You may have a sigmoidoscopy every 5 years or a colonoscopy every 10 years starting at age 51.  Hepatitis C blood test.  Hepatitis B blood test.  Sexually transmitted disease (STD) testing.  Diabetes screening. This is done by checking your blood sugar (glucose) after you have not eaten for a while (fasting). You may have this done every 1-3 years.  Bone density scan. This is done  to screen for osteoporosis. You may have this done starting at age 52.  Mammogram. This may be done every 1-2 years. Talk to your health care provider about how often you should have regular mammograms. Talk with your health care provider about your test results,  treatment options, and if necessary, the need for more tests. Vaccines  Your health care provider may recommend certain vaccines, such as:  Influenza vaccine. This is recommended every year.  Tetanus, diphtheria, and acellular pertussis (Tdap, Td) vaccine. You may need a Td booster every 10 years.  Zoster vaccine. You may need this after age 60.  Pneumococcal 13-valent conjugate (PCV13) vaccine. One dose is recommended after age 44.  Pneumococcal polysaccharide (PPSV23) vaccine. One dose is recommended after age 53. Talk to your health care provider about which screenings and vaccines you need and how often you need them. This information is not intended to replace advice given to you by your health care provider. Make sure you discuss any questions you have with your health care provider. Document Released: 04/09/2015 Document Revised: 12/01/2015 Document Reviewed: 01/12/2015 Elsevier Interactive Patient Education  2017 [employer] Prevention in the Home Falls can cause injuries. They can happen to people of all ages. There are many things you can do to make your home safe and to help prevent falls. What can I do on the outside of my home?  Regularly fix the edges of walkways and driveways and fix any cracks.  Remove anything that might make you trip as you walk through a door, such as a raised step or threshold.  Trim any bushes or trees on the path to your home.  Use bright outdoor lighting.  Clear any walking paths of anything that might make someone trip, such as rocks or tools.  Regularly check to see if handrails are loose or broken. Make sure that both sides of any steps have handrails.  Any raised decks and porches should have guardrails on the edges.  Have any leaves, snow, or ice cleared regularly.  Use sand or salt on walking paths during winter.  Clean up any spills in your garage right away. This includes oil or grease spills. What can I do in the  bathroom?  Use night lights.  Install grab bars by the toilet and in the tub and shower. Do not use towel bars as grab bars.  Use non-skid mats or decals in the tub or shower.  If you need to sit down in the shower, use a plastic, non-slip stool.  Keep the floor dry. Clean up any water that spills on the floor as soon as it happens.  Remove soap buildup in the tub or shower regularly.  Attach bath mats securely with double-sided non-slip rug tape.  Do not have throw rugs and other things on the floor that can make you trip. What can I do in the bedroom?  Use night lights.  Make sure that you have a light by your bed that is easy to reach.  Do not use any sheets or blankets that are too big for your bed. They should not hang down onto the floor.  Have a firm chair that has side arms. You can use this for support while you get dressed.  Do not have throw rugs and other things on the floor that can make you trip. What can I do in the kitchen?  Clean up any spills right away.  Avoid walking on wet floors.  Keep items  that you use a lot in easy-to-reach places.  If you need to reach something above you, use a strong step stool that has a grab bar.  Keep electrical cords out of the way.  Do not use floor polish or wax that makes floors slippery. If you must use wax, use non-skid floor wax.  Do not have throw rugs and other things on the floor that can make you trip. What can I do with my stairs?  Do not leave any items on the stairs.  Make sure that there are handrails on both sides of the stairs and use them. Fix handrails that are broken or loose. Make sure that handrails are as long as the stairways.  Check any carpeting to make sure that it is firmly attached to the stairs. Fix any carpet that is loose or worn.  Avoid having throw rugs at the top or bottom of the stairs. If you do have throw rugs, attach them to the floor with carpet tape.  Make sure that you have a  light switch at the top of the stairs and the bottom of the stairs. If you do not have them, ask someone to add them for you. What else can I do to help prevent falls?  Wear shoes that:  Do not have high heels.  Have rubber bottoms.  Are comfortable and fit you well.  Are closed at the toe. Do not wear sandals.  If you use a stepladder:  Make sure that it is fully opened. Do not climb a closed stepladder.  Make sure that both sides of the stepladder are locked into place.  Ask someone to hold it for you, if possible.  Clearly mark and make sure that you can see:  Any grab bars or handrails.  First and last steps.  Where the edge of each step is.  Use tools that help you move around (mobility aids) if they are needed. These include:  Canes.  Walkers.  Scooters.  Crutches.  Turn on the lights when you go into a dark area. Replace any light bulbs as soon as they burn out.  Set up your furniture so you have a clear path. Avoid moving your furniture around.  If any of your floors are uneven, fix them.  If there are any pets around you, be aware of where they are.  Review your medicines with your doctor. Some medicines can make you feel dizzy. This can increase your chance of falling. Ask your doctor what other things that you can do to help prevent falls. This information is not intended to replace advice given to you by your health care provider. Make sure you discuss any questions you have with your health care provider. Document Released: 01/07/2009 Document Revised: 08/19/2015 Document Reviewed: 04/17/2014 Elsevier Interactive Patient Education  2017 [employer].

## 2020-07-19 ENCOUNTER — Ambulatory Visit (INDEPENDENT_AMBULATORY_CARE_PROVIDER_SITE_OTHER): Payer: Medicare Other | Admitting: Family Medicine

## 2020-07-19 ENCOUNTER — Other Ambulatory Visit: Payer: Self-pay

## 2020-07-19 ENCOUNTER — Encounter: Payer: Self-pay | Admitting: Family Medicine

## 2020-07-19 VITALS — BP 139/81 | HR 87 | Temp 97.7°F | Ht 69.0 in | Wt 185.2 lb

## 2020-07-19 DIAGNOSIS — R519 Headache, unspecified: Secondary | ICD-10-CM

## 2020-07-19 DIAGNOSIS — G609 Hereditary and idiopathic neuropathy, unspecified: Secondary | ICD-10-CM

## 2020-07-19 MED ORDER — TAPENTADOL HCL 100 MG PO TABS: 100.0000 mg | ORAL_TABLET | Freq: Four times a day (QID) | ORAL | 0 refills | Status: DC

## 2020-07-19 MED ORDER — ZALEPLON 10 MG PO CAPS: 10.0000 mg | ORAL_CAPSULE | Freq: Every evening | ORAL | 5 refills | Status: DC | PRN

## 2020-07-19 MED ORDER — KETOROLAC TROMETHAMINE 60 MG/2ML IM SOLN
60.0000 mg | Freq: Once | INTRAMUSCULAR | Status: AC
  Administered 2020-07-19: 60 mg via INTRAMUSCULAR

## 2020-07-19 MED ORDER — ONDANSETRON 8 MG PO TBDP
8.0000 mg | ORAL_TABLET | Freq: Four times a day (QID) | ORAL | 1 refills | Status: DC | PRN
Start: 2020-07-19 — End: 2021-07-19

## 2020-07-19 MED ORDER — BETAMETHASONE SOD PHOS & ACET 6 (3-3) MG/ML IJ SUSP
6.0000 mg | Freq: Once | INTRAMUSCULAR | Status: AC
  Administered 2020-07-19: 6 mg via INTRAMUSCULAR

## 2020-07-19 NOTE — Progress Notes (Signed)
Subjective:  Patient ID: [name], female    DOB: [birth year]  Age: 75 y.o. MRN: [id number]  CC: Medication Refill   HPI Damarys Speir presents for sinus HA for two days from pollen allergy. Also rhinorrhea. Eyes watery. Sneezing a lot.    Pain is from neuropathy. Intense burning sensation. Feet primarily, but some in the legs and the hands. Had NCV with neurology in past for diagnosis.  PDMP review shows no discrepancies, MME is 160.No change in dose for several years.   Depression screen [facility] 2/9 07/19/2020 07/16/2020 05/25/2020  Decreased Interest 0 0 0  Down, Depressed, Hopeless 0 0 0  PHQ - 2 Score 0 0 0  Altered sleeping - - -  Tired, decreased energy - - -  Change in appetite - - -  Feeling bad or failure about yourself  - - -  Trouble concentrating - - -  Moving slowly or fidgety/restless - - -  Suicidal thoughts - - -  PHQ-9 Score - - -  Difficult doing work/chores - - -  Some recent data might be hidden    History Tahara has a past medical history of Cataract, Chronic narcotic use, Headache, Hereditary and idiopathic peripheral neuropathy, Hyperlipidemia, Hypertension, Hypokalemia, OA (osteoarthritis), Peripheral neuropathy, and Wears glasses.   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   Her family history includes Arthritis in her brother; Dementia in her father; Heart disease in her mother; Hyperlipidemia in her mother; Hypertension in her brother, father, and mother; Neuropathy in her father; Stroke in her mother.She reports that she quit  smoking about 22 years ago. Her smoking use included cigarettes. She has a 15.00 pack-year smoking history. She has never used smokeless tobacco. She reports current alcohol use of about 4.0 standard drinks of alcohol per week. She reports that she does not use drugs.    ROS Review of Systems  Constitutional: Positive for fatigue. Negative for appetite change, chills, diaphoresis and fever.  HENT: Positive for congestion, rhinorrhea and sneezing. Negative for ear pain, hearing loss, postnasal drip, sore throat and trouble swallowing.   Respiratory: Negative for cough, chest tightness and shortness of breath.   Cardiovascular: Negative for chest pain and palpitations.  Gastrointestinal: Positive for nausea. Negative for abdominal pain.  Musculoskeletal: Negative for arthralgias.  Skin: Negative for rash.    Objective:  BP 139/81   Pulse 87   Temp 97.7 F (36.5 C)   Ht 5\' 9"  (1.753 m)   Wt 185 lb 3.2 oz (84 kg)   SpO2 100%   BMI 27.35 kg/m   BP Readings from Last 3 Encounters:  07/19/20 139/81  05/25/20 131/80  04/29/20 132/72    Wt Readings from Last 3 Encounters:  07/19/20 185 lb 3.2 oz (84 kg)  07/16/20 186 lb (84.4 kg)  05/25/20 188 lb 4 oz (85.4 kg)     Physical Exam Constitutional:      General: She is not in acute distress.    Appearance: She is well-developed.  Cardiovascular:     Rate and Rhythm: Normal rate and regular rhythm.  Pulmonary:     Breath sounds: Normal breath sounds.  Skin:  General: Skin is warm and dry.  Neurological:     Mental Status: She is alert and oriented to person, place, and time.       Assessment & Plan:   Chauntel was seen today for medication refill.  Diagnoses and all orders for this visit:  Idiopathic peripheral neuropathy       I am having Jalena Vanderlinden. [employer]" maintain her loratadine, ibuprofen, prednisoLONE acetate, potassium chloride, fenofibrate, estradiol, Tapentadol HCl, metoprolol succinate,  zaleplon, and mometasone.  Allergies as of 07/19/2020      Reactions   Statins Other (See Comments)   Myalgia, turns red   Penicillins Rash, Other (See Comments)   Pt not sure if she is allergic to penicillin or sulfa. So she does not take either one.  DID THE REACTION INVOLVE: Swelling of the face/tongue/throat, SOB, or low BP? Unknown Sudden or severe rash/hives, skin peeling, or the inside of the mouth or nose? No Did it require medical treatment? No When did it last happen?childhood allergy If all above answers are "NO", may proceed with cephalosporin use.   Sulfa Antibiotics Rash, Other (See Comments)   Pt not sure if she is allergic to sulfa or penicillin so she doesn't take either one.       Medication List       Accurate as of July 19, 2020 10:36 AM. If you have any questions, ask your nurse or doctor.        estradiol 0.1 MG/GM vaginal cream Commonly known as: ESTRACE PLACE 1 APPLICATORFUL VAGINALLY ONCE A WEEK. AT BED TIME   fenofibrate 160 MG tablet Take 1 tablet (160 mg total) by mouth daily.   ibuprofen 200 MG tablet Commonly known as: ADVIL Take 600 mg by mouth daily as needed for moderate pain.   loratadine 10 MG tablet Commonly known as: CLARITIN Take 10 mg by mouth daily at 2 PM.   metoprolol succinate 50 MG 24 hr tablet Commonly known as: TOPROL-XL Take with or immediately following a meal.   mometasone 0.1 % cream Commonly known as: Elocon Apply 1 application topically daily. To affected areas   potassium chloride 10 MEQ tablet Commonly known as: KLOR-CON TAKE 2 TO 3 TABLETS BY MOUTH DAILY AS DIRECTED   prednisoLONE acetate 1 % ophthalmic suspension Commonly known as: PRED FORTE Place 1 drop into the right eye 4 (four) times daily.   Tapentadol HCl 100 MG Tabs Take 1 tablet (100 mg total) by mouth 4 (four) times daily.   zaleplon 10 MG capsule Commonly known as: SONATA Take 1 capsule (10 mg total) by mouth at bedtime as needed for  sleep.        Follow-up: No follow-ups on file.  Claretta Fraise, M.D.

## 2020-08-23 ENCOUNTER — Other Ambulatory Visit: Payer: Self-pay | Admitting: Family Medicine

## 2020-09-13 ENCOUNTER — Other Ambulatory Visit: Payer: Self-pay | Admitting: Family Medicine

## 2020-09-13 DIAGNOSIS — Z1231 Encounter for screening mammogram for malignant neoplasm of breast: Secondary | ICD-10-CM

## 2020-10-19 ENCOUNTER — Other Ambulatory Visit: Payer: Self-pay

## 2020-10-19 ENCOUNTER — Ambulatory Visit (INDEPENDENT_AMBULATORY_CARE_PROVIDER_SITE_OTHER): Payer: Medicare Other | Admitting: Family Medicine

## 2020-10-19 ENCOUNTER — Encounter: Payer: Self-pay | Admitting: Family Medicine

## 2020-10-19 VITALS — BP 134/79 | HR 77 | Temp 98.0°F | Ht 69.0 in | Wt 186.8 lb

## 2020-10-19 DIAGNOSIS — G609 Hereditary and idiopathic neuropathy, unspecified: Secondary | ICD-10-CM | POA: Diagnosis not present

## 2020-10-19 DIAGNOSIS — Z23 Encounter for immunization: Secondary | ICD-10-CM

## 2020-10-19 DIAGNOSIS — E782 Mixed hyperlipidemia: Secondary | ICD-10-CM

## 2020-10-19 DIAGNOSIS — I10 Essential (primary) hypertension: Secondary | ICD-10-CM

## 2020-10-19 MED ORDER — TAPENTADOL HCL 100 MG PO TABS: 100.0000 mg | ORAL_TABLET | Freq: Four times a day (QID) | ORAL | 0 refills | Status: DC

## 2020-10-19 NOTE — Progress Notes (Signed)
Subjective:  Patient ID: Brittany Mercer, female    DOB: 1945/06/24  Age: 75 y.o. MRN: [id number]  CC: Medical Management of Chronic Issues   HPI Aubrei Bouchie presents for Pain assessment: Cause of pain- neuopathy Pain location- feet Pain on scale of 1-10- 7/10 Frequency- daily What increases pain-no What makes pain Better-no Effects on ADL - none as long as taking medication Any change in general medical condition-none  Current opioids rx- nucynta # meds rx- 1 Effectiveness of current meds-excellent Adverse reactions from pain meds-none Morphine equivalent- 160  Pill count performed-No Last drug screen - 6 mos ago.  ( high risk q29m moderate risk q643mlow risk yearly ) U[place] drug screen today- No [place]  If yes were their any concerning findings? - no   Overdose risk: 280  [facility] 07/19/2020 07/16/2020  Decreased Interest 0 0 0  Down, Depressed, Hopeless 0 0 0  PHQ - 2 Score 0 0 0  Altered sleeping - - -  Tired, decreased energy - - -  Change in appetite - - -  Feeling bad or failure about yourself  - - -  Trouble concentrating - - -  Moving slowly or fidgety/restless - - -  Suicidal thoughts - - -  PHQ-9 Score - - -  Difficult doing work/chores - - -  Some recent data might be hidden    History PaDonnas a past medical history of Cataract, Chronic narcotic use, Headache, Hereditary and idiopathic peripheral neuropathy, Hyperlipidemia, Hypertension, Hypokalemia, OA (osteoarthritis), Peripheral neuropathy, and Wears glasses.   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   Her family history includes Arthritis in her brother; Dementia in her father; Heart disease in her mother; Hyperlipidemia in her mother; Hypertension in her brother, father, and mother; Neuropathy in her father; Stroke in her mother.She reports that she quit smoking about 22 years ago. Her smoking use included cigarettes. She has a 15.00 pack-year smoking history. She has never used smokeless tobacco. She reports current alcohol use of about 4.0 standard drinks of alcohol per week. She reports that she does not use drugs.    ROS Review of Systems  Constitutional: Negative.   HENT: Negative.    Eyes:  Negative for visual disturbance.  Respiratory:  Negative for shortness of breath.   Cardiovascular:  Negative for chest pain.  Gastrointestinal:  Negative for abdominal pain.  Musculoskeletal:  Negative for arthralgias.  Psychiatric/Behavioral:  Positive for sleep disturbance (some insomnia.Can go to sleep easier with zaleplon, but doesn't sleep at all when spells occur if she doesn't take it.).    Objective:  BP 134/79   Pulse 77   Temp 98 F (36.7 C)   Ht '5\' 9"'  (1.753 m)   Wt 186 lb 12.8 oz (84.7 kg)   SpO2 97%   BMI 27.59 kg/m   BP Readings from Last 3 Encounters:  10/19/20 134/79  07/19/20 139/81  05/25/20 131/80    Wt Readings from Last 3 Encounters:  10/19/20 186 lb 12.8 oz (84.7 kg)  07/19/20 185 lb 3.2 oz (84 kg)  07/16/20 186 lb (84.4 kg)  Physical Exam Constitutional:      General: She is not in acute distress.    Appearance: She is well-developed.  HENT:     Head: Normocephalic and atraumatic.  Eyes:     Conjunctiva/sclera: Conjunctivae normal.     Pupils: Pupils are equal, round, and reactive to light.  Neck:     Thyroid: No thyromegaly.  Cardiovascular:     Rate and Rhythm: Normal rate and regular rhythm.     Heart sounds: Normal heart sounds. No murmur  heard. Pulmonary:     Effort: Pulmonary effort is normal. No respiratory distress.     Breath sounds: Normal breath sounds. No wheezing or rales.  Abdominal:     General: Bowel sounds are normal. There is no distension.     Palpations: Abdomen is soft.     Tenderness: There is no abdominal tenderness.  Musculoskeletal:        General: Normal range of motion.     Cervical back: Normal range of motion and neck supple.  Lymphadenopathy:     Cervical: No cervical adenopathy.  Skin:    General: Skin is warm and dry.  Neurological:     Mental Status: She is alert and oriented to person, place, and time.  Psychiatric:        Behavior: Behavior normal.        Thought Content: Thought content normal.        Judgment: Judgment normal.      Assessment & Plan:   Eliana was seen today for medical management of chronic issues.  Diagnoses and all orders for this visit:  Mixed hyperlipidemia -     Lipid panel  Essential hypertension, benign -     CBC with Differential/Platelet -     CMP14+EGFR  Idiopathic peripheral neuropathy -     Tapentadol HCl 100 MG TABS; Take 1 tablet (100 mg total) by mouth 4 (four) times daily. -     Tapentadol HCl 100 MG TABS; Take 1 tablet (100 mg total) by mouth 4 (four) times daily. -     Tapentadol HCl 100 MG TABS; Take 1 tablet (100 mg total) by mouth 4 (four) times daily.      I am having Vennie Salsbury. [employer]" maintain her loratadine, ibuprofen, prednisoLONE acetate, fenofibrate, estradiol, metoprolol succinate, zaleplon, ondansetron, potassium chloride, Tapentadol HCl, Tapentadol HCl, and Tapentadol HCl.  Allergies as of 10/19/2020       Reactions   Statins Other (See Comments)   Myalgia, turns red   Penicillins Rash, Other (See Comments)   Pt not sure if she is allergic to penicillin or sulfa. So she does not take either one.  DID THE REACTION INVOLVE: Swelling of the face/tongue/throat, SOB, or low BP? Unknown Sudden or severe  rash/hives, skin peeling, or the inside of the mouth or nose? No Did it require medical treatment? No When did it last happen?      childhood allergy If all above answers are "NO", may proceed with cephalosporin use.   Sulfa Antibiotics Rash, Other (See Comments)   Pt not sure if she is allergic to sulfa or penicillin so she doesn't take either one.         Medication List        Accurate as of October 19, 2020  4:09 PM. If you have any questions, ask your nurse or doctor.          estradiol 0.1 MG/GM vaginal cream Commonly known as: ESTRACE PLACE 1  APPLICATORFUL VAGINALLY ONCE A WEEK. AT BED TIME   fenofibrate 160 MG tablet Take 1 tablet (160 mg total) by mouth daily.   ibuprofen 200 MG tablet Commonly known as: ADVIL Take 600 mg by mouth daily as needed for moderate pain.   loratadine 10 MG tablet Commonly known as: CLARITIN Take 10 mg by mouth daily at 2 PM.   metoprolol succinate 50 MG 24 hr tablet Commonly known as: TOPROL-XL Take with or immediately following a meal.   ondansetron 8 MG disintegrating tablet Commonly known as: ZOFRAN-ODT Take 1 tablet (8 mg total) by mouth every 6 (six) hours as needed for nausea or vomiting.   potassium chloride 10 MEQ tablet Commonly known as: KLOR-CON TAKE 2 TO 3 TABLETS BY MOUTH DAILY AS DIRECTED   prednisoLONE acetate 1 % ophthalmic suspension Commonly known as: PRED FORTE Place 1 drop into the right eye 4 (four) times daily.   Tapentadol HCl 100 MG Tabs Take 1 tablet (100 mg total) by mouth 4 (four) times daily. What changed: Another medication with the same name was changed. Make sure you understand how and when to take each. Changed by: Claretta Fraise, MD   Tapentadol HCl 100 MG Tabs Take 1 tablet (100 mg total) by mouth 4 (four) times daily. Start taking on: November 21, 2020 What changed: These instructions start on November 21, 2020. If you are unsure what to do until then, ask your doctor or other care  provider. Changed by: Claretta Fraise, MD   Tapentadol HCl 100 MG Tabs Take 1 tablet (100 mg total) by mouth 4 (four) times daily. Start taking on: December 21, 2020 What changed: These instructions start on December 21, 2020. If you are unsure what to do until then, ask your doctor or other care provider. Changed by: Claretta Fraise, MD   zaleplon 10 MG capsule Commonly known as: SONATA Take 1 capsule (10 mg total) by mouth at bedtime as needed for sleep.         Follow-up: Return in about 3 months (around 01/19/2021).  Claretta Fraise, M.D.

## 2020-10-19 NOTE — Addendum Note (Signed)
Addended by: Christia Reading on: 10/19/2020 05:13 PM   Modules accepted: Orders

## 2020-11-05 ENCOUNTER — Other Ambulatory Visit: Payer: Self-pay

## 2020-11-05 ENCOUNTER — Ambulatory Visit
Admission: RE | Admit: 2020-11-05 | Discharge: 2020-11-05 | Disposition: A | Discharge status: Home / Self Care | Payer: Medicare Other | Source: Ambulatory Visit | Attending: Family Medicine | Admitting: Family Medicine

## 2020-11-05 DIAGNOSIS — Z1231 Encounter for screening mammogram for malignant neoplasm of breast: Secondary | ICD-10-CM | POA: Diagnosis not present

## 2020-11-11 ENCOUNTER — Other Ambulatory Visit: Payer: Self-pay | Admitting: Family Medicine

## 2020-11-23 DIAGNOSIS — L570 Actinic keratosis: Secondary | ICD-10-CM | POA: Diagnosis not present

## 2020-11-23 DIAGNOSIS — Z85828 Personal history of other malignant neoplasm of skin: Secondary | ICD-10-CM | POA: Diagnosis not present

## 2020-11-23 DIAGNOSIS — Z1283 Encounter for screening for malignant neoplasm of skin: Secondary | ICD-10-CM | POA: Diagnosis not present

## 2021-01-06 ENCOUNTER — Encounter: Payer: Self-pay | Admitting: Family Medicine

## 2021-01-06 ENCOUNTER — Ambulatory Visit (INDEPENDENT_AMBULATORY_CARE_PROVIDER_SITE_OTHER): Payer: Medicare Other | Admitting: Family

## 2021-01-06 ENCOUNTER — Encounter: Payer: Self-pay | Admitting: Family

## 2021-01-06 DIAGNOSIS — R42 Dizziness and giddiness: Secondary | ICD-10-CM

## 2021-01-06 MED ORDER — MECLIZINE HCL 25 MG PO TABS: 25.0000 mg | ORAL_TABLET | Freq: Three times a day (TID) | ORAL | 1 refills | Status: DC | PRN

## 2021-01-06 NOTE — Patient Instructions (Signed)
Dizziness Dizziness is a common problem. It is a feeling of unsteadiness or light-headedness. You may feel like you are about to faint. Dizziness can lead to injury if you stumble or fall. Anyone can become dizzy, but dizziness is more common in older adults. This condition can be caused by a number of things, including medicines, dehydration, or illness. Follow these instructions at home: Eating and drinking  Drink enough fluid to keep your urine pale yellow. This helps to keep you from becoming dehydrated. Try to drink more clear fluids, such as water. Do not drink alcohol. Limit your caffeine intake if told to do so by your health care provider. Check ingredients and nutrition facts to see if a food or beverage contains caffeine. Limit your salt (sodium) intake if told to do so by your health care provider. Check ingredients and nutrition facts to see if a food or beverage contains sodium. Activity  Avoid making quick movements. Rise slowly from chairs and steady yourself until you feel okay. In the morning, first sit up on the side of the bed. When you feel okay, stand slowly while you hold onto something until you know that your balance is good. If you need to stand in one place for a long time, move your legs often. Tighten and relax the muscles in your legs while you are standing. Do not drive or use machinery if you feel dizzy. Avoid bending down if you feel dizzy. Place items in your home so that they are easy for you to reach without leaning over. Lifestyle Do not use any products that contain nicotine or tobacco. These products include cigarettes, chewing tobacco, and vaping devices, such as e-cigarettes. If you need help quitting, ask your health care provider. Try to reduce your stress level by using methods such as yoga or meditation. Talk with your health care provider if you need help to manage your stress. General instructions Watch your dizziness for any changes. Take  over-the-counter and prescription medicines only as told by your health care provider. Talk with your health care provider if you think that your dizziness is caused by a medicine that you are taking. Tell a friend or a family member that you are feeling dizzy. If he or she notices any changes in your behavior, have this person call your health care provider. Keep all follow-up visits. This is important. Contact a health care provider if: Your dizziness does not go away or you have new symptoms. Your dizziness or light-headedness gets worse. You feel nauseous. You have reduced hearing. You have a fever. You have neck pain or a stiff neck. Your dizziness leads to an injury or a fall. Get help right away if: You vomit or have diarrhea and are unable to eat or drink anything. You have problems talking, walking, swallowing, or using your arms, hands, or legs. You feel generally weak. You have any bleeding. You are not thinking clearly or you have trouble forming sentences. It may take a friend or family member to notice this. You have chest pain, abdominal pain, shortness of breath, or sweating. Your vision changes or you develop a severe headache. These symptoms may represent a serious problem that is an emergency. Do not wait to see if the symptoms will go away. Get medical help right away. Call your local emergency services (911 in the [place] Do not drive yourself to the hospital. Summary Dizziness is a feeling of unsteadiness or light-headedness. This condition can be caused by a number of   things, including medicines, dehydration, or illness. Anyone can become dizzy, but dizziness is more common in older adults. Drink enough fluid to keep your urine pale yellow. Do not drink alcohol. Avoid making quick movements if you feel dizzy. Monitor your dizziness for any changes. This information is not intended to replace advice given to you by your health care provider. Make sure you discuss any  questions you have with your health care provider. Document Revised: 02/16/2020 Document Reviewed: 02/16/2020 Elsevier Patient Education  2022 [employer]  

## 2021-01-06 NOTE — Progress Notes (Signed)
Virtual Visit  Note Due to COVID-19 pandemic this visit was conducted virtually. This visit type was conducted due to national recommendations for restrictions regarding the COVID-19 Pandemic (e.g. social distancing, sheltering in place) in an effort to limit this patient's exposure and mitigate transmission in our community. All issues noted in this document were discussed and addressed.  A physical exam was not performed with this format.  I connected with Brittany Mercer on 01/06/21 at 3:15 pm  by telephone and verified that I am speaking with the correct person using two identifiers. Brittany Mercer is currently located at home and no one is currently with her during visit. The provider, Evelina Dun, FNP is located in their office at time of visit.  I discussed the limitations, risks, security and privacy concerns of performing an evaluation and management service by telephone and the availability of in person appointments. I also discussed with the patient that there may be a patient responsible charge related to this service. The patient expressed understanding and agreed to proceed.  Brittany Mercer, Brittany Mercer are scheduled for a virtual visit with your provider today.    Just as we do with appointments in the office, we must obtain your consent to participate.  Your consent will be active for this visit and any virtual visit you may have with one of our providers in the next 365 days.    If you have a MyChart account, I can also send a copy of this consent to you electronically.  All virtual visits are billed to your insurance company just like a traditional visit in the office.  As this is a virtual visit, video technology does not allow for your provider to perform a traditional examination.  This may limit your provider's ability to fully assess your condition.  If your provider identifies any concerns that need to be evaluated in person or the need to arrange testing such as labs,  EKG, etc, we will make arrangements to do so.    Although advances in technology are sophisticated, we cannot ensure that it will always work on either your end or our end.  If the connection with a video visit is poor, we may have to switch to a telephone visit.  With either a video or telephone visit, we are not always able to ensure that we have a secure connection.   I need to obtain your verbal consent now.   Are you willing to proceed with your visit today?   Brittany Mercer has provided verbal consent on 01/06/2021 for a virtual visit (video or telephone).   Evelina Dun, [place] 01/06/2021  3:17 PM    History and Present Illness:  Pt calls the office today for dizziness that started two weeks. Dizziness This is a new problem. The current episode started 1 to 4 weeks ago. The problem occurs intermittently. The problem has been gradually improving. Associated symptoms include vertigo. Pertinent negatives include no change in bowel habit, chest pain, chills, congestion, coughing, fever, headaches, joint swelling, myalgias, nausea, neck pain, swollen glands, visual change or vomiting. She has tried rest for the symptoms. The treatment provided mild relief.      Review of Systems  Constitutional:  Negative for chills and fever.  HENT:  Negative for congestion.   Respiratory:  Negative for cough.   Cardiovascular:  Negative for chest pain.  Gastrointestinal:  Negative for change in bowel habit, nausea and vomiting.  Musculoskeletal:  Negative for joint swelling, myalgias and neck  pain.  Neurological:  Positive for dizziness and vertigo. Negative for headaches.    Observations/Objective: No SOB or distress noted   Assessment and Plan: 1. Dizziness Force fluids Fall precautions Avoid fast position changes  Antivert as needed Follow up if symptoms worsen or do not improve  - meclizine (ANTIVERT) 25 MG tablet; Take 1 tablet (25 mg total) by mouth 3 (three) times daily as  needed for dizziness.  Dispense: 60 tablet; Refill: 1     I discussed the assessment and treatment plan with the patient. The patient was provided an opportunity to ask questions and all were answered. The patient agreed with the plan and demonstrated an understanding of the instructions.   The patient was advised to call back or seek an in-person evaluation if the symptoms worsen or if the condition fails to improve as anticipated.  The above assessment and management plan was discussed with the patient. The patient verbalized understanding of and has agreed to the management plan. Patient is aware to call the clinic if symptoms persist or worsen. Patient is aware when to return to the clinic for a follow-up visit. Patient educated on when it is appropriate to go to the emergency department.   Time call ended:  3:26 pm   I provided 11 minutes of  non face-to-face time during this encounter.    Evelina Dun, FNP

## 2021-01-07 ENCOUNTER — Ambulatory Visit: Payer: Medicare Other | Admitting: Family

## 2021-01-11 ENCOUNTER — Other Ambulatory Visit: Payer: Self-pay | Admitting: Family Medicine

## 2021-01-12 ENCOUNTER — Other Ambulatory Visit: Payer: Self-pay | Admitting: Family Medicine

## 2021-01-12 ENCOUNTER — Encounter: Payer: Self-pay | Admitting: Family Medicine

## 2021-01-12 DIAGNOSIS — G609 Hereditary and idiopathic neuropathy, unspecified: Secondary | ICD-10-CM

## 2021-01-12 MED ORDER — TAPENTADOL HCL 100 MG PO TABS: 100.0000 mg | ORAL_TABLET | Freq: Four times a day (QID) | ORAL | 0 refills | Status: DC

## 2021-01-14 DIAGNOSIS — Z23 Encounter for immunization: Secondary | ICD-10-CM | POA: Diagnosis not present

## 2021-01-20 ENCOUNTER — Other Ambulatory Visit: Payer: Self-pay | Admitting: Family Medicine

## 2021-01-24 ENCOUNTER — Other Ambulatory Visit: Payer: Self-pay

## 2021-01-24 ENCOUNTER — Encounter: Payer: Self-pay | Admitting: Family Medicine

## 2021-01-24 ENCOUNTER — Ambulatory Visit (INDEPENDENT_AMBULATORY_CARE_PROVIDER_SITE_OTHER): Payer: Medicare Other | Admitting: Family Medicine

## 2021-01-24 VITALS — BP 159/75 | HR 72 | Temp 97.4°F | Ht 69.0 in | Wt 192.0 lb

## 2021-01-24 DIAGNOSIS — E782 Mixed hyperlipidemia: Secondary | ICD-10-CM

## 2021-01-24 DIAGNOSIS — I10 Essential (primary) hypertension: Secondary | ICD-10-CM

## 2021-01-24 DIAGNOSIS — G609 Hereditary and idiopathic neuropathy, unspecified: Secondary | ICD-10-CM | POA: Diagnosis not present

## 2021-01-24 DIAGNOSIS — H8113 Benign paroxysmal vertigo, bilateral: Secondary | ICD-10-CM

## 2021-01-24 DIAGNOSIS — H9113 Presbycusis, bilateral: Secondary | ICD-10-CM | POA: Diagnosis not present

## 2021-01-24 DIAGNOSIS — F1120 Opioid dependence, uncomplicated: Secondary | ICD-10-CM | POA: Diagnosis not present

## 2021-01-24 MED ORDER — TAPENTADOL HCL 100 MG PO TABS: 100.0000 mg | ORAL_TABLET | Freq: Four times a day (QID) | ORAL | 0 refills | Status: DC

## 2021-01-24 MED ORDER — FENOFIBRATE 160 MG PO TABS: 160.0000 mg | ORAL_TABLET | Freq: Every day | ORAL | 2 refills | Status: DC

## 2021-01-24 MED ORDER — POTASSIUM CHLORIDE ER 10 MEQ PO TBCR: EXTENDED_RELEASE_TABLET | ORAL | 1 refills | Status: DC

## 2021-01-24 MED ORDER — ESTRADIOL 0.1 MG/GM VA CREA: 1.0000 | TOPICAL_CREAM | VAGINAL | 12 refills | Status: DC

## 2021-01-24 MED ORDER — TAPENTADOL HCL 100 MG PO TABS
100.0000 mg | ORAL_TABLET | Freq: Four times a day (QID) | ORAL | 0 refills | Status: DC
Start: 2021-02-24 — End: 2021-04-27

## 2021-01-24 MED ORDER — METOPROLOL SUCCINATE ER 50 MG PO TB24: ORAL_TABLET | ORAL | 1 refills | Status: DC

## 2021-01-24 NOTE — Progress Notes (Signed)
Subjective:  Patient ID: Brittany Mercer, female    DOB: 1945/10/07  Age: 75 y.o. MRN: [id number]  CC: Medical Management of Chronic Issues   HPI Brittany Mercer presents for several weeks of vertigo. Fell at onset. Has been off and on. Lasts about 30 seconds. Occurring every few days. Not relieved by meclizine. She is using prn, but sx gone before she can take it.    in for follow-up of elevated cholesterol. Doing well without complaints on current medication. Denies side effects of fenofibrate including myalgia and arthralgia and nausea. Currently no chest pain, shortness of breath or other cardiovascular related symptoms noted.  Still using estrogen weekly. Helps avoid irritation at vagina.   Pain med's due for renewal. Still taking QID due to neuropathic pain. Controls the pain well, but if she runs out the pain is unbearable.    presents for  follow-up of hypertension. Patient has no history of headache chest pain or shortness of breath or recent cough. Patient also denies symptoms of TIA such as focal numbness or weakness. Patient denies side effects from medication. States taking it regularly.    Depression screen [facility] 2/9 01/24/2021 10/19/2020 07/19/2020  Decreased Interest 0 0 0  Down, Depressed, Hopeless 0 0 0  PHQ - 2 Score 0 0 0  Altered sleeping - - -  Tired, decreased energy - - -  Change in appetite - - -  Feeling bad or failure about yourself  - - -  Trouble concentrating - - -  Moving slowly or fidgety/restless - - -  Suicidal thoughts - - -  PHQ-9 Score - - -  Difficult doing work/chores - - -  Some recent data might be hidden    History Brittany Mercer has a past medical history of Cataract, Chronic narcotic use, Headache, Hereditary and idiopathic peripheral neuropathy, Hyperlipidemia, Hypertension, Hypokalemia, OA (osteoarthritis), Peripheral neuropathy, and Wears glasses.   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   Her family history includes Arthritis in her brother; Dementia in her father; Heart disease in her mother; Hyperlipidemia in her mother; Hypertension in her brother, father, and mother; Neuropathy in her father; Stroke in her mother.She reports that she quit smoking about 22 years ago. Her smoking use included cigarettes. She has a 15.00 pack-year smoking history. She has never used smokeless tobacco. She reports current alcohol use of about 4.0 standard drinks per week. She reports that she does not use drugs.    ROS Review of Systems  Constitutional: Negative.   HENT: Negative.    Eyes:  Negative for visual disturbance.  Respiratory:  Negative for shortness of breath.   Cardiovascular:  Negative for chest pain.  Gastrointestinal:  Negative for abdominal pain.  Musculoskeletal:  Negative for arthralgias.  Neurological:  Positive for dizziness. Negative for light-headedness.   Objective:  BP (!) 159/75   Pulse 72   Temp (!) 97.4 F (36.3 C)   Ht 5\' 9"  (1.753 m)   Wt 192 lb (87.1 kg)   SpO2 97%   BMI 28.35 kg/m   BP Readings from Last 3 Encounters:  01/24/21 (!) 159/75  10/19/20 134/79  07/19/20 139/81  Wt Readings from Last 3 Encounters:  01/24/21 192 lb (87.1 kg)  10/19/20 186 lb 12.8 oz (84.7 kg)  07/19/20 185 lb 3.2 oz (84 kg)     Physical Exam Constitutional:      General: She is not in acute distress.    Appearance: She is well-developed.  HENT:     Right Ear: Tympanic membrane and ear canal normal. There is no impacted cerumen.     Left Ear: Tympanic membrane and ear canal normal.  There is no impacted cerumen.  Cardiovascular:     Rate and Rhythm: Normal rate and regular rhythm.  Pulmonary:     Breath sounds: Normal breath sounds.  Musculoskeletal:        General: Normal range of motion.  Skin:    General: Skin is warm and dry.  Neurological:     Mental Status: She is alert and oriented to person, place, and time.      Assessment & Plan:   Brittany Mercer was seen today for medical management of chronic issues.  Diagnoses and all orders for this visit:  Benign paroxysmal positional vertigo due to bilateral vestibular disorder -     Ambulatory referral to Physical Therapy  Idiopathic peripheral neuropathy -     Tapentadol HCl 100 MG TABS; Take 1 tablet (100 mg total) by mouth 4 (four) times daily. -     Tapentadol HCl 100 MG TABS; Take 1 tablet (100 mg total) by mouth 4 (four) times daily. -     Tapentadol HCl 100 MG TABS; Take 1 tablet (100 mg total) by mouth 4 (four) times daily for 5 days.  Presbycusis of both ears  Uncomplicated opioid dependence (HCC)  Mixed hyperlipidemia  Essential hypertension, benign  Other orders -     fenofibrate 160 MG tablet; Take 1 tablet (160 mg total) by mouth daily. -     metoprolol succinate (TOPROL-XL) 50 MG 24 hr tablet; TAKE ONE TABLET BY MOUTH WITH OR IMMEDIATELY FOLLOWING A MEAL ONCE DAILY -     potassium chloride (KLOR-CON) 10 MEQ tablet; TAKE 2 TO 3 TABLETS BY MOUTH DAILY AS DIRECTED -     estradiol (ESTRACE) 0.1 MG/GM vaginal cream; Place 1 Applicatorful vaginally once a week. At bed time      I have changed Brittany Mercer. Brittany Mercer fenofibrate. I am also having her maintain her loratadine, ibuprofen, prednisoLONE acetate, zaleplon, ondansetron, meclizine, metoprolol succinate, potassium chloride, Tapentadol HCl, Tapentadol HCl, Tapentadol HCl, and estradiol.  Allergies as of 01/24/2021       Reactions   Statins Other (See Comments)   Myalgia, turns red   Penicillins Rash, Other (See Comments)   Pt  not sure if she is allergic to penicillin or sulfa. So she does not take either one.  DID THE REACTION INVOLVE: Swelling of the face/tongue/throat, SOB, or low BP? Unknown Sudden or severe rash/hives, skin peeling, or the inside of the mouth or nose? No Did it require medical treatment? No When did it last happen?      childhood allergy If all above answers are "NO", may proceed with cephalosporin use.   Sulfa Antibiotics Rash, Other (See Comments)   Pt not sure if she is allergic to sulfa or penicillin so she doesn't take either one.         Medication List        Accurate as of January 24, 2021 11:12 AM. If you have any questions, ask your nurse or doctor.  estradiol 0.1 MG/GM vaginal cream Commonly known as: ESTRACE Place 1 Applicatorful vaginally once a week. At bed time   fenofibrate 160 MG tablet Take 1 tablet (160 mg total) by mouth daily.   ibuprofen 200 MG tablet Commonly known as: ADVIL Take 600 mg by mouth daily as needed for moderate pain.   loratadine 10 MG tablet Commonly known as: CLARITIN Take 10 mg by mouth daily at 2 PM.   meclizine 25 MG tablet Commonly known as: ANTIVERT Take 1 tablet (25 mg total) by mouth 3 (three) times daily as needed for dizziness.   metoprolol succinate 50 MG 24 hr tablet Commonly known as: TOPROL-XL TAKE ONE TABLET BY MOUTH WITH OR IMMEDIATELY FOLLOWING A MEAL ONCE DAILY   ondansetron 8 MG disintegrating tablet Commonly known as: ZOFRAN-ODT Take 1 tablet (8 mg total) by mouth every 6 (six) hours as needed for nausea or vomiting.   potassium chloride 10 MEQ tablet Commonly known as: KLOR-CON TAKE 2 TO 3 TABLETS BY MOUTH DAILY AS DIRECTED Start taking on: March 26, 2021 What changed: These instructions start on March 26, 2021. If you are unsure what to do until then, ask your doctor or other care provider. Changed by: Claretta Fraise, MD   prednisoLONE acetate 1 % ophthalmic suspension Commonly known as:  PRED FORTE Place 1 drop into the right eye 4 (four) times daily.   Tapentadol HCl 100 MG Tabs Take 1 tablet (100 mg total) by mouth 4 (four) times daily. What changed: Another medication with the same name was changed. Make sure you understand how and when to take each. Changed by: Claretta Fraise, MD   Tapentadol HCl 100 MG Tabs Take 1 tablet (100 mg total) by mouth 4 (four) times daily for 5 days. What changed: Another medication with the same name was changed. Make sure you understand how and when to take each. Changed by: Claretta Fraise, MD   Tapentadol HCl 100 MG Tabs Take 1 tablet (100 mg total) by mouth 4 (four) times daily. Start taking on: February 24, 2021 What changed: These instructions start on February 24, 2021. If you are unsure what to do until then, ask your doctor or other care provider. Changed by: Claretta Fraise, MD   zaleplon 10 MG capsule Commonly known as: SONATA Take 1 capsule (10 mg total) by mouth at bedtime as needed for sleep.         Follow-up: Return in about 3 months (around 04/26/2021).  Claretta Fraise, M.D.

## 2021-01-25 DIAGNOSIS — Z947 Corneal transplant status: Secondary | ICD-10-CM | POA: Insufficient documentation

## 2021-01-25 DIAGNOSIS — Z961 Presence of intraocular lens: Secondary | ICD-10-CM | POA: Insufficient documentation

## 2021-03-21 ENCOUNTER — Encounter: Payer: Self-pay | Admitting: Family Medicine

## 2021-03-22 ENCOUNTER — Other Ambulatory Visit: Payer: Self-pay | Admitting: Family Medicine

## 2021-03-22 DIAGNOSIS — G609 Hereditary and idiopathic neuropathy, unspecified: Secondary | ICD-10-CM

## 2021-03-22 MED ORDER — TAPENTADOL HCL 100 MG PO TABS
100.0000 mg | ORAL_TABLET | Freq: Four times a day (QID) | ORAL | 0 refills | Status: DC
Start: 2021-03-25 — End: 2021-04-27

## 2021-03-31 ENCOUNTER — Other Ambulatory Visit: Payer: Self-pay

## 2021-03-31 ENCOUNTER — Ambulatory Visit: Payer: Medicare Other | Attending: Family Medicine | Admitting: Physical Therapy

## 2021-03-31 DIAGNOSIS — R2681 Unsteadiness on feet: Secondary | ICD-10-CM | POA: Diagnosis not present

## 2021-03-31 DIAGNOSIS — R42 Dizziness and giddiness: Secondary | ICD-10-CM | POA: Insufficient documentation

## 2021-03-31 DIAGNOSIS — H8113 Benign paroxysmal vertigo, bilateral: Secondary | ICD-10-CM | POA: Insufficient documentation

## 2021-03-31 NOTE — Therapy (Signed)
[facility] [address] [place], [place], [address] Phone: [phone]   Fax:  [phone]  Physical Therapy Evaluation  Patient Details  Name: Brittany Mercer MRN: [id number] Date of Birth: 08/22/1945 Referring Provider (PT): Claretta Fraise, MD   Encounter Date: 03/31/2021   PT End of Session - 03/31/21 0936     Visit Number 1    Number of Visits 8    Date for PT Re-Evaluation 04/29/21    Authorization Type Medicare/[employer]    PT Start Time [phone]    PT Stop Time 1019    PT Time Calculation (min) 45 min    Activity Tolerance Patient tolerated treatment well    Behavior During Therapy [facility] for tasks assessed/performed             Past Medical History:  Diagnosis Date   Cataract    Chronic narcotic use    takes Nucynta for neuropathic pain   Headache    Hereditary and idiopathic peripheral neuropathy    followed by pcp   Hyperlipidemia    Hypertension    Hypokalemia    OA (osteoarthritis)    LEFT KNEE   Peripheral neuropathy    Wears glasses     Past Surgical History:  Procedure Laterality Date   ABDOMINAL HYSTERECTOMY  1980s   W/ UNILATERAL SALPINGOOPHORECTOMY   CATARACT EXTRACTION Right    CESAREAN SECTION  x2   last one [place] Right 05/22/2019   EYE SURGERY     KNEE ARTHROSCOPY Right x2  last one 2016   KNEE CLOSED REDUCTION Left 04/11/2018   Procedure: CLOSED MANIPULATION KNEE;  Surgeon: Paralee Cancel, MD;  Location: [facility];  Service: Orthopedics;  Laterality: Left;   SHOULDER OPEN ROTATOR CUFF REPAIR Left 1990s   TONSILLECTOMY  age 27   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   Procedure: RIGHT TOTAL KNEE ARTHROPLASTY;  Surgeon: Paralee Cancel, MD;  Location: [facility];  Service: Orthopedics;  Laterality: Right;   TOTAL KNEE ARTHROPLASTY Left 02/05/2018   Procedure: LEFT TOTAL KNEE ARTHROPLASTY;  Surgeon: Paralee Cancel, MD;  Location: [facility];  Service: Orthopedics;   Laterality: Left;  70 mins    There were no vitals filed for this visit.    Subjective Assessment - 03/31/21 0938     Subjective Rarely happens but had an episode in October.  It's disconcerting when it happens.    Patient Stated Goals Know what to do when the dizziness comes on    Currently in Pain? No/denies                [facility] PT Assessment - 03/31/21 0001       Assessment   Medical Diagnosis BPPV    Referring Provider (PT) Claretta Fraise, MD    Onset Date/Surgical Date 01/24/21      Balance Screen   Has the patient fallen in the past 6 months No    Has the patient had a decrease in activity level because of a fear of falling?  No    Is the patient reluctant to leave their home because of a fear of falling?  No                    Vestibular Assessment - 03/31/21 0001       Symptom Behavior   Subjective history of current problem reports rare episodes of vertigo; had a recent episode earlier this week; no  [facility] [address] [place], [place], [address] Phone: [phone]   Fax:  [phone]  Physical Therapy Evaluation  Patient Details  Name: Brittany Mercer MRN: [id number] Date of Birth: 08/22/1945 Referring Provider (PT): Claretta Fraise, MD   Encounter Date: 03/31/2021   PT End of Session - 03/31/21 0936     Visit Number 1    Number of Visits 8    Date for PT Re-Evaluation 04/29/21    Authorization Type Medicare/[employer]    PT Start Time [phone]    PT Stop Time 1019    PT Time Calculation (min) 45 min    Activity Tolerance Patient tolerated treatment well    Behavior During Therapy [facility] for tasks assessed/performed             Past Medical History:  Diagnosis Date   Cataract    Chronic narcotic use    takes Nucynta for neuropathic pain   Headache    Hereditary and idiopathic peripheral neuropathy    followed by pcp   Hyperlipidemia    Hypertension    Hypokalemia    OA (osteoarthritis)    LEFT KNEE   Peripheral neuropathy    Wears glasses     Past Surgical History:  Procedure Laterality Date   ABDOMINAL HYSTERECTOMY  1980s   W/ UNILATERAL SALPINGOOPHORECTOMY   CATARACT EXTRACTION Right    CESAREAN SECTION  x2   last one [place] Right 05/22/2019   EYE SURGERY     KNEE ARTHROSCOPY Right x2  last one 2016   KNEE CLOSED REDUCTION Left 04/11/2018   Procedure: CLOSED MANIPULATION KNEE;  Surgeon: Paralee Cancel, MD;  Location: [facility];  Service: Orthopedics;  Laterality: Left;   SHOULDER OPEN ROTATOR CUFF REPAIR Left 1990s   TONSILLECTOMY  age 27   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   Procedure: RIGHT TOTAL KNEE ARTHROPLASTY;  Surgeon: Paralee Cancel, MD;  Location: [facility];  Service: Orthopedics;  Laterality: Right;   TOTAL KNEE ARTHROPLASTY Left 02/05/2018   Procedure: LEFT TOTAL KNEE ARTHROPLASTY;  Surgeon: Paralee Cancel, MD;  Location: [facility];  Service: Orthopedics;   Laterality: Left;  70 mins    There were no vitals filed for this visit.    Subjective Assessment - 03/31/21 0938     Subjective Rarely happens but had an episode in October.  It's disconcerting when it happens.    Patient Stated Goals Know what to do when the dizziness comes on    Currently in Pain? No/denies                [facility] PT Assessment - 03/31/21 0001       Assessment   Medical Diagnosis BPPV    Referring Provider (PT) Claretta Fraise, MD    Onset Date/Surgical Date 01/24/21      Balance Screen   Has the patient fallen in the past 6 months No    Has the patient had a decrease in activity level because of a fear of falling?  No    Is the patient reluctant to leave their home because of a fear of falling?  No                    Vestibular Assessment - 03/31/21 0001       Symptom Behavior   Subjective history of current problem reports rare episodes of vertigo; had a recent episode earlier this week; no  Time 5    Period Weeks    Status New      PT LONG TERM GOAL #2   Title The patient will have negative dix hallpike test indicating resolution of BPPV.    Baseline mild symptoms initially to L (1-2/10)    Time 4    Period Weeks    Status New      PT LONG TERM GOAL #3   Title Further balance testing to be assessed/goals to be written as appropriate.                    Plan - 03/31/21 1636     Clinical Impression Statement Pt is a 76 year old female who presents to [facility] today with reports of dizziness lasting short bouts very infrequently; pt reports 1-2 times per year, but she had episode in October and reports another episode earlier this week.  With oculomotor testing, slight dizziness brought on with head impulse test to left; with positional testing, no nystagmus noted, but pt c/o dizziness with L Dix-Hallpike (1-2/10 rating).  Performed Epley maneuver for L posterior canal, with pt having no additional reports of dizziness/symptoms during this maneuver.  The only other time she reports symptoms is with return to sitting from L sidelying positioning; habituation exercises initiated to address.  Pt will benefit from skilled PT to address any remaining dizziness and to further assess balance as needed, given pt's hx of reported dizziness and peripheral neuropathy.    Personal Factors and Comorbidities Comorbidity 3+    Comorbidities PMH:  peripheral neuropathy, HTN, HLD    Examination-Activity Limitations Locomotion Level;Bed Mobility    Stability/Clinical Decision Making Stable/Uncomplicated    Clinical Decision Making Low    Rehab Potential Good    PT Frequency 2x / week    PT Duration 4 weeks   plus eval visit (5 weeks total POC)   PT Treatment/Interventions Canalith Repostioning;Functional mobility training;Therapeutic activities;Therapeutic exercise;Balance training;Neuromuscular re-education;Manual techniques;Passive range of  motion;Vestibular;Patient/family education    PT Next Visit Plan Reassess Epley maneuver and review HEP.  Further balance testing, motion testing as needed and update HEP (needs to schedule more appts if needed)    Consulted and Agree with Plan of Care Patient             Patient will benefit from skilled therapeutic intervention in order to improve the following deficits and impairments:  Dizziness, Decreased balance, Decreased mobility  Visit Diagnosis: Dizziness and giddiness  Unsteadiness on feet     Problem List Patient Active Problem List   Diagnosis Date Noted   Uncomplicated opioid dependence (Opp) 08/02/2018   Stiffness of left knee 04/12/2018   S/P left TKA 02/05/2018   S/P knee replacement 02/05/2018   Presbycusis of both ears 10/16/2016   S/P right TKA 01/25/2015   Benign paroxysmal positional vertigo 02/09/2014   Somnambulism 10/27/2013   Essential hypertension, benign 07/17/2012   Mixed hyperlipidemia 07/17/2012   Migraines 07/17/2012   Idiopathic peripheral neuropathy 07/17/2012   Hypokalemia 07/17/2012    Brittany Teters W., PT 03/31/2021, 4:49 PM  [facility] [address] [place], [place], [address] Phone: [phone]   Fax:  [phone]  Name: Brittany Mercer MRN: [id number] Date of Birth: 1945-11-23

## 2021-04-04 ENCOUNTER — Ambulatory Visit: Payer: Medicare Other | Admitting: Physical Therapy

## 2021-04-04 ENCOUNTER — Encounter: Payer: Self-pay | Admitting: Physical Therapy

## 2021-04-04 ENCOUNTER — Other Ambulatory Visit: Payer: Self-pay

## 2021-04-04 DIAGNOSIS — R2681 Unsteadiness on feet: Secondary | ICD-10-CM | POA: Diagnosis not present

## 2021-04-04 DIAGNOSIS — R42 Dizziness and giddiness: Secondary | ICD-10-CM

## 2021-04-04 DIAGNOSIS — H8113 Benign paroxysmal vertigo, bilateral: Secondary | ICD-10-CM | POA: Diagnosis not present

## 2021-04-04 NOTE — Therapy (Signed)
[facility] [address], [address] [place], [place], [address] Phone: [phone]   Fax:  [phone]  Physical Therapy Treatment  Patient Details  Name: Brittany Mercer MRN: [id number] Date of Birth: 09-07-1945 Referring Provider (PT): Mechele Claude, MD   Encounter Date: 04/04/2021   PT End of Session - 04/04/21 0930     Visit Number 2    Number of Visits 8    Date for PT Re-Evaluation 04/29/21    Authorization Type Medicare/[employer]    PT Start Time 0930    PT Stop Time 1017    PT Time Calculation (min) 47 min    Activity Tolerance Patient tolerated treatment well    Behavior During Therapy [facility] for tasks assessed/performed             Past Medical History:  Diagnosis Date   Cataract    Chronic narcotic use    takes Nucynta for neuropathic pain   Headache    Hereditary and idiopathic peripheral neuropathy    followed by pcp   Hyperlipidemia    Hypertension    Hypokalemia    OA (osteoarthritis)    LEFT KNEE   Peripheral neuropathy    Wears glasses     Past Surgical History:  Procedure Laterality Date   ABDOMINAL HYSTERECTOMY  1980s   W/ UNILATERAL SALPINGOOPHORECTOMY   CATARACT EXTRACTION Right    CESAREAN SECTION  x2   last one 1975   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   EYE SURGERY     KNEE ARTHROSCOPY Right x2  last one 2016   KNEE CLOSED REDUCTION Left 04/11/2018   Procedure: CLOSED MANIPULATION KNEE;  Surgeon: Durene Romans, MD;  Location: [facility];  Service: Orthopedics;  Laterality: Left;   SHOULDER OPEN ROTATOR CUFF REPAIR Left 1990s   TONSILLECTOMY  age 9   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   Procedure: RIGHT TOTAL KNEE ARTHROPLASTY;  Surgeon: Durene Romans, MD;  Location: [facility];  Service: Orthopedics;  Laterality: Right;   TOTAL KNEE ARTHROPLASTY Left 02/05/2018   Procedure: LEFT TOTAL KNEE ARTHROPLASTY;  Surgeon: Durene Romans, MD;  Location: [facility];  Service: Orthopedics;   Laterality: Left;  70 mins    There were no vitals filed for this visit.   Subjective Assessment - 04/04/21 0929     Subjective No changes, just with the exercises, had just a brief moment of "not quite" dizziness, about 1/10.    Patient Stated Goals Know what to do when the dizziness comes on    Currently in Pain? No/denies                [facility] PT Assessment - 04/04/21 0001       Functional Gait  Assessment   Gait assessed  Yes    Gait Level Surface Walks 20 ft, slow speed, abnormal gait pattern, evidence for imbalance or deviates 10-15 in outside of the 12 in walkway width. Requires more than 7 sec to ambulate 20 ft.   9.03   Change in Gait Speed Able to change speed, demonstrates mild gait deviations, deviates 6-10 in outside of the 12 in walkway width, or no gait deviations, unable to achieve a major change in velocity, or uses a change in velocity, or uses an assistive device.    Gait with Horizontal Head Turns Performs head turns smoothly with slight change in gait velocity (eg, minor disruption to smooth gait path), deviates 6-10 in outside 12 in walkway width,  or uses an assistive device.    Gait with Vertical Head Turns Performs head turns with no change in gait. Deviates no more than 6 in outside 12 in walkway width.    Gait and Pivot Turn Pivot turns safely within 3 sec and stops quickly with no loss of balance.    Step Over Obstacle Is able to step over one shoe box (4.5 in total height) without changing gait speed. No evidence of imbalance.    Gait with Narrow Base of Support Ambulates less than 4 steps heel to toe or cannot perform without assistance.    Gait with Eyes Closed Walks 20 ft, slow speed, abnormal gait pattern, evidence for imbalance, deviates 10-15 in outside 12 in walkway width. Requires more than 9 sec to ambulate 20 ft.   18.15   Ambulating Backwards Walks 20 ft, uses assistive device, slower speed, mild gait deviations, deviates 6-10 in outside 12 in  walkway width.   16.31   Steps Alternating feet, must use rail.    Total Score 18    FGA comment: Scores <22/30 indicate increased fall risk.                 Vestibular Assessment - 04/04/21 0001       Dix-Hallpike Right   Dix-Hallpike Right Duration None    Dix-Hallpike Right Symptoms No nystagmus      Dix-Hallpike Left   Dix-Hallpike Left Duration none    Dix-Hallpike Left Symptoms No nystagmus   No symtpoms supine or with return to sit today.                     [facility] Adult PT Treatment/Exercise - 04/04/21 0001       High Level Balance   High Level Balance Comments Stand EO solid surface 30 sec, EC 10.96 sec on solid surface; on foam:  30 seconds EO with increased sway; 4.78 sec with EC with increased sway.             Vestibular Treatment/Exercise - 04/04/21 0001       Vestibular Treatment/Exercise   Habituation Exercises Austin Miles    Gaze Exercises X1 Viewing Horizontal;X1 Viewing Vertical      Austin Miles   Number of Reps  1   each side   Symptom Description  Coming up to sit from right and left sidelying, minimal symptoms reported (1/10)      X1 Viewing Horizontal   Foot Position seated; standing    Reps 5    Comments C/o dizziness 1/10 sitting; in standing, c/o unsteadiness, off balance      X1 Viewing Vertical   Foot Position seated    Reps 5    Comments No c/o symptoms                Balance Exercises - 04/04/21 0001       Balance Exercises: Standing   Standing Eyes Opened Wide (BOA);Solid surface;Limitations    Standing Eyes Opened Limitations Head turns/head nods    Wall Bumps Hip;Eyes opened;10 reps;Limitations    Wall Bumps Limitations with UE support in corner    Heel Raises Both;10 reps    Toe Raise Both;10 reps                PT Education - 04/04/21 1206     Education Details Additions to HEP; fall risk per FGA test; working to address multi-systems for balance impairment    Person(s) Educated  Patient  Methods Explanation;Demonstration;Verbal cues;Handout    Comprehension Verbalized understanding;Returned demonstration                 PT Long Term Goals - 04/04/21 1211       PT LONG TERM GOAL #1   Title Pt will be independent with HEP for improved balance, decreased dizziness.  TARGET 04/29/2021    Time 5    Period Weeks    Status New      PT LONG TERM GOAL #2   Title The patient will have negative dix hallpike test indicating resolution of BPPV.    Baseline mild symptoms initially to L (1-2/10)    Time 4    Period Weeks    Status New      PT LONG TERM GOAL #3   Title FGA score to improve to at least 22/30 for decreased fall risk.    Baseline 18/301/11/2021    Time 5    Period Weeks    Status New      PT LONG TERM GOAL #4   Title Pt will perform standing with Eyes closed on solid/compliant surfaces for 15 seconds to demonstrate improved vestibular system use for balance.    Baseline 10 sec; 4.9 sec 04/04/2021    Time 5    Period Weeks    Status New                   Plan - 04/04/21 0930     Clinical Impression Statement Pt does not have any nystagmus of symptoms noted on repeat of Dix-Hallpicke today.  She demonstrates understanding of initial HEP and reports continued slight dizziness upon return to sitting from both sidelying R and L positions.  With further balance assessment today, pt demonstrates decreased vestibular system use with decreased ability to hold balance in eyes closed position on solid and foam surfaces.  Also, she is at increased risk of falls per FGA score of 18/30; she does have history of peripheral neuropathy, which is likely contributing to decreased balance.  She will continue to benefit from skilled PT towards goals for improved balance, decreased dizziness and unsteadiness.    Personal Factors and Comorbidities Comorbidity 3+    Comorbidities PMH:  peripheral neuropathy, HTN, HLD    Examination-Activity Limitations  Locomotion Level;Bed Mobility    Stability/Clinical Decision Making Stable/Uncomplicated    Rehab Potential Good    PT Frequency 2x / week    PT Duration 4 weeks   plus eval visit (5 weeks total POC)   PT Treatment/Interventions Canalith Repostioning;Functional mobility training;Therapeutic activities;Therapeutic exercise;Balance training;Neuromuscular re-education;Manual techniques;Passive range of motion;Vestibular;Patient/family education    PT Next Visit Plan Review updates to HEP; continue to progress multi-sensory balance exercises; head motions, x 1 viewing if needing in sitting/standing added to HEP.  Per pt report/request-may need to assess leg length and SI joint pain    Consulted and Agree with Plan of Care Patient             Patient will benefit from skilled therapeutic intervention in order to improve the following deficits and impairments:  Dizziness, Decreased balance, Decreased mobility  Visit Diagnosis: Dizziness and giddiness  Unsteadiness on feet     Problem List Patient Active Problem List   Diagnosis Date Noted   Uncomplicated opioid dependence (HCC) 08/02/2018   Stiffness of left knee 04/12/2018   S/P left TKA 02/05/2018   S/P knee replacement 02/05/2018   Presbycusis of both ears 10/16/2016   S/P right TKA  01/25/2015   Benign paroxysmal positional vertigo 02/09/2014   Somnambulism 10/27/2013   Essential hypertension, benign 07/17/2012   Mixed hyperlipidemia 07/17/2012   Migraines 07/17/2012   Idiopathic peripheral neuropathy 07/17/2012   Hypokalemia 07/17/2012    Jacoria Keiffer W., PT 04/04/2021, 12:13 PM  [facility] [address] [place], [place], [address] Phone: [phone]   Fax:  [phone]  Name: Brittany Mercer MRN: [id number] Date of Birth: April 21, 1945

## 2021-04-04 NOTE — Patient Instructions (Signed)
Access Code: [email or web address] URL: [email or web address] Date: 04/04/2021 Prepared by: [facility]  Exercises Brandt-Daroff Vestibular Exercise - 1-2 x daily - 7 x weekly - 1 sets - 3 reps Standing Balance in Corner - 1 x daily - 5 x weekly - 1 sets - 10 reps Standing Anterior Posterior Weight Shift with Chair - 1 x daily - 5 x weekly - 1 sets - 10 reps

## 2021-04-08 ENCOUNTER — Encounter: Payer: Self-pay | Admitting: Physical Therapy

## 2021-04-08 ENCOUNTER — Ambulatory Visit: Payer: Medicare Other | Admitting: Physical Therapy

## 2021-04-08 ENCOUNTER — Other Ambulatory Visit: Payer: Self-pay

## 2021-04-08 DIAGNOSIS — H8113 Benign paroxysmal vertigo, bilateral: Secondary | ICD-10-CM | POA: Diagnosis not present

## 2021-04-08 DIAGNOSIS — R42 Dizziness and giddiness: Secondary | ICD-10-CM

## 2021-04-08 DIAGNOSIS — R2681 Unsteadiness on feet: Secondary | ICD-10-CM

## 2021-04-08 NOTE — Therapy (Signed)
[facility] [address], [place], [place], [address] Phone: [phone]   Fax:  [phone]  Physical Therapy Treatment  Patient Details  Name: Brittany Mercer MRN: [id number] Date of Birth: 01-06-1946 Referring Provider (PT): Claretta Fraise, MD   Encounter Date: 04/08/2021   PT End of Session - 04/08/21 1055     Visit Number 3    Number of Visits 8    Date for PT Re-Evaluation 04/29/21    Authorization Type Medicare/[employer]    PT Start Time 0932    PT Stop Time 1013    PT Time Calculation (min) 41 min    Equipment Utilized During Treatment Gait belt    Activity Tolerance Patient tolerated treatment well    Behavior During Therapy [facility] for tasks assessed/performed;Anxious             Past Medical History:  Diagnosis Date   Cataract    Chronic narcotic use    takes Nucynta for neuropathic pain   Headache    Hereditary and idiopathic peripheral neuropathy    followed by pcp   Hyperlipidemia    Hypertension    Hypokalemia    OA (osteoarthritis)    LEFT KNEE   Peripheral neuropathy    Wears glasses     Past Surgical History:  Procedure Laterality Date   ABDOMINAL HYSTERECTOMY  1980s   W/ UNILATERAL SALPINGOOPHORECTOMY   CATARACT EXTRACTION Right    CESAREAN SECTION  x2   last one [place] Right 05/22/2019   EYE SURGERY     KNEE ARTHROSCOPY Right x2  last one 2016   KNEE CLOSED REDUCTION Left 04/11/2018   Procedure: CLOSED MANIPULATION KNEE;  Surgeon: Paralee Cancel, MD;  Location: [facility];  Service: Orthopedics;  Laterality: Left;   SHOULDER OPEN ROTATOR CUFF REPAIR Left 1990s   TONSILLECTOMY  age 30   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   Procedure: RIGHT TOTAL KNEE ARTHROPLASTY;  Surgeon: Paralee Cancel, MD;  Location: [facility];  Service: Orthopedics;  Laterality: Right;   TOTAL KNEE ARTHROPLASTY Left 02/05/2018   Procedure: LEFT TOTAL KNEE ARTHROPLASTY;  Surgeon: Paralee Cancel, MD;  Location: [facility];  Service: Orthopedics;  Laterality: Left;  70 mins    There were no vitals filed for this visit.   Subjective Assessment - 04/08/21 0932     Subjective Has not had any vertigo since last session. Has been working on [employer] and denie questions. Has a hx of migraines but has not had any for 20 years.    Patient Stated Goals Know what to do when the dizziness comes on    Currently in Pain? No/denies                                Vestibular Treatment/Exercise - 04/08/21 0001       Vestibular Treatment/Exercise   Habituation Exercises Nestor Lewandowsky    Gaze Exercises Eye/Head Exercise Horizontal;Eye/Head Exercise Vertical      Nestor Lewandowsky   Number of Reps  2    Symptom Description  2/10 dizziness when coming up to sit with EO; 3/10 dizziness with EC each side      X1 Viewing Horizontal   Foot Position standing    Reps --   30 sec x 2   Comments trouble with gaze fixation with L head turn; no dizziness  X1 Viewing Vertical   Foot Position standing    Reps --   30" x2   Comments no dizziness; c/o imbalance      Eye/Head Exercise Horizontal   Foot Position standing    Reps --   30"   Comments VOR cancellation; 1/10 dizziness      Eye/Head Exercise Vertical   Foot Position standing    Reps --   30 sec   Comments VOR cancellation; 1/10 dizziness                Balance Exercises - 04/08/21 0001       Balance Exercises: Standing   Standing Eyes Closed Wide (BOA);Solid surface;30 secs;Limitations    Standing Eyes Closed Limitations with head turns/nods    Other Standing Exercises romberg EC 2x30"; R/L fwd/back stepping with CGA 10x each    Other Standing Exercises Comments ant/pos wt shifts 10x2   heavy cues to maintain toes/heels still               PT Education - 04/08/21 1054     Education Details update to HEP-Access Code: [email or web address]    Person(s) Educated Patient    Methods  Explanation;Demonstration;Tactile cues;Verbal cues;Handout    Comprehension Verbalized understanding;Returned demonstration                 PT Long Term Goals - 04/08/21 1056       PT LONG TERM GOAL #1   Title Pt will be independent with HEP for improved balance, decreased dizziness.  TARGET 04/29/2021    Time 5    Period Weeks    Status On-going      PT LONG TERM GOAL #2   Title The patient will have negative dix hallpike test indicating resolution of BPPV.    Baseline mild symptoms initially to L (1-2/10)    Time 4    Period Weeks    Status On-going      PT LONG TERM GOAL #3   Title FGA score to improve to at least 22/30 for decreased fall risk.    Baseline 18/301/11/2021    Time 5    Period Weeks    Status On-going      PT LONG TERM GOAL #4   Title Pt will perform standing with Eyes closed on solid/compliant surfaces for 15 seconds to demonstrate improved vestibular system use for balance.    Baseline 10 sec; 4.9 sec 04/04/2021    Time 5    Period Weeks    Status On-going                   Plan - 04/08/21 1055     Clinical Impression Statement Patient arrived to session with report of no episodes of vertigo since last session. Has been working on [employer] and denies questions. Reports hx of migraines but denies episodes for the past 20 years. Denies motion sickness or symptoms of visual vertigo.  Worked on activities to challenge VOR today with patient reporting no dizziness however with evident trouble with gaze fixation with L head turn. Patient noting sensation of considerable imbalance, particularly with vertical VOR. Able to bring on mild c/o dizziness with VOR cancellation. Progressed habituation to [facility] today; patient did report increased dizziness with this medication. Patient with report of considerable unsteadiness with standing balance activities and quick to rely on reaching strategy; discussed trying to strengthen balance confidence and use of ankle and hip  strategy instead. Patient reported understanding and without complaints  at end of session.    Personal Factors and Comorbidities Comorbidity 3+    Comorbidities PMH:  peripheral neuropathy, HTN, HLD    Examination-Activity Limitations Locomotion Level;Bed Mobility    Stability/Clinical Decision Making Stable/Uncomplicated    Rehab Potential Good    PT Frequency 2x / week    PT Duration 4 weeks   plus eval visit (5 weeks total POC)   PT Treatment/Interventions Canalith Repostioning;Functional mobility training;Therapeutic activities;Therapeutic exercise;Balance training;Neuromuscular re-education;Manual techniques;Passive range of motion;Vestibular;Patient/family education    PT Next Visit Plan Review updates to HEP; continue to progress multi-sensory balance exercises; head motions, x 1 viewing if needing in sitting/standing added to HEP.  Per pt report/request-may need to assess leg length and SI joint pain    Consulted and Agree with Plan of Care Patient             Patient will benefit from skilled therapeutic intervention in order to improve the following deficits and impairments:  Dizziness, Decreased balance, Decreased mobility  Visit Diagnosis: Dizziness and giddiness  Unsteadiness on feet     Problem List Patient Active Problem List   Diagnosis Date Noted   Uncomplicated opioid dependence ([place]) 08/02/2018   Stiffness of left knee 04/12/2018   S/P left TKA 02/05/2018   S/P knee replacement 02/05/2018   Presbycusis of both ears 10/16/2016   S/P right TKA 01/25/2015   Benign paroxysmal positional vertigo 02/09/2014   Somnambulism 10/27/2013   Essential hypertension, benign 07/17/2012   Mixed hyperlipidemia 07/17/2012   Migraines 07/17/2012   Idiopathic peripheral neuropathy 07/17/2012   Hypokalemia 07/17/2012     Janene Harvey, PT, DPT 04/08/21 10:57 AM   [facility] [address], [place], [place],  [address] Phone: [phone]   Fax:  [phone]  Name: Brittany Mercer MRN: [id number] Date of Birth: 1945/06/28

## 2021-04-12 ENCOUNTER — Ambulatory Visit: Payer: Medicare Other | Admitting: Physical Therapy

## 2021-04-14 ENCOUNTER — Other Ambulatory Visit: Payer: Self-pay

## 2021-04-14 ENCOUNTER — Encounter: Payer: Self-pay | Admitting: Physical Therapy

## 2021-04-14 ENCOUNTER — Ambulatory Visit: Payer: Medicare Other | Admitting: Physical Therapy

## 2021-04-14 DIAGNOSIS — R2681 Unsteadiness on feet: Secondary | ICD-10-CM

## 2021-04-14 DIAGNOSIS — R42 Dizziness and giddiness: Secondary | ICD-10-CM | POA: Diagnosis not present

## 2021-04-14 DIAGNOSIS — H8113 Benign paroxysmal vertigo, bilateral: Secondary | ICD-10-CM | POA: Diagnosis not present

## 2021-04-14 NOTE — Therapy (Signed)
[facility] [address], [place], [place], [address] Phone: [phone]   Fax:  [phone]  Physical Therapy Treatment  Patient Details  Name: Brittany Mercer MRN: [id number] Date of Birth: January 14, 1946 Referring Provider (PT): Claretta Fraise, MD   Encounter Date: 04/14/2021   PT End of Session - 04/14/21 1239     Visit Number 4    Number of Visits 8    Date for PT Re-Evaluation 04/29/21    Authorization Type Medicare/[employer]    PT Start Time [phone]    PT Stop Time 1013    PT Time Calculation (min) 39 min    Equipment Utilized During Treatment --    Activity Tolerance Patient tolerated treatment well    Behavior During Therapy WFL for tasks assessed/performed             Past Medical History:  Diagnosis Date   Cataract    Chronic narcotic use    takes Nucynta for neuropathic pain   Headache    Hereditary and idiopathic peripheral neuropathy    followed by pcp   Hyperlipidemia    Hypertension    Hypokalemia    OA (osteoarthritis)    LEFT KNEE   Peripheral neuropathy    Wears glasses     Past Surgical History:  Procedure Laterality Date   ABDOMINAL HYSTERECTOMY  1980s   W/ UNILATERAL SALPINGOOPHORECTOMY   CATARACT EXTRACTION Right    CESAREAN SECTION  x2   last one [place] Right 05/22/2019   EYE SURGERY     KNEE ARTHROSCOPY Right x2  last one 2016   KNEE CLOSED REDUCTION Left 04/11/2018   Procedure: CLOSED MANIPULATION KNEE;  Surgeon: Paralee Cancel, MD;  Location: [facility];  Service: Orthopedics;  Laterality: Left;   SHOULDER OPEN ROTATOR CUFF REPAIR Left 1990s   TONSILLECTOMY  age 76   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   Procedure: RIGHT TOTAL KNEE ARTHROPLASTY;  Surgeon: Paralee Cancel, MD;  Location: [facility];  Service: Orthopedics;  Laterality: Right;   TOTAL KNEE ARTHROPLASTY Left 02/05/2018   Procedure: LEFT TOTAL KNEE ARTHROPLASTY;  Surgeon: Paralee Cancel, MD;   Location: [facility];  Service: Orthopedics;  Laterality: Left;  70 mins    There were no vitals filed for this visit.   Subjective Assessment - 04/14/21 0938     Subjective Has had one mild episode with a quick turn, head was spinning and it went away very quickly.    Patient Stated Goals Know what to do when the dizziness comes on    Currently in Pain? No/denies                  Reviewed HEP from last visit:  Standing gaze stabilization with head nods 30 sec, head turns 30 sec Standing VOR cancellation horizontal, 30 seconds-rates symptoms as 1/10 Pt return demo understanding.             [place] Adult PT Treatment/Exercise - 04/14/21 0001       Therapeutic Activites    Therapeutic Activities Other Therapeutic Activities    Other Therapeutic Activities Pt mentions feeling one leg is longer than other.  Initial postural assessment at bony prominences:  iliac crest, PSIS, patella-no difference.  With lying supine on mat, after bridging 3 reps, LLE appears longer than RLE.  Measured from greater trochanter to medial malleolus:  LLE 86 cm, RLE 85 cm.  Discussed option for slight OTC insert  movements to maintain balance initially.  Did assess leg length and LLE slightly longer than RLE (1 cm), and she endorses that when she walks she feels that RLE dips slightly.  Discussed having her trial a minimal shoe insert to see if that helps.  She will continue to benefit from skilled PT towards goals for improved balalance, functional mobility.    Personal Factors and Comorbidities Comorbidity 3+    Comorbidities PMH:  peripheral neuropathy, HTN, HLD    Examination-Activity Limitations Locomotion Level;Bed Mobility    Stability/Clinical Decision Making Stable/Uncomplicated    Rehab Potential Good    PT Frequency 2x / week    PT Duration 4 weeks   plus eval visit (5 weeks total POC)   PT Treatment/Interventions Canalith Repostioning;Functional mobility training;Therapeutic activities;Therapeutic exercise;Balance training;Neuromuscular re-education;Manual techniques;Passive range of motion;Vestibular;Patient/family education    PT Next Visit Plan Continue to update HEP; continue to progress multi-sensory balance exercises; head motions, x 1 viewing if needing in sitting/standing added to HEP; work on standing and gait with  turns.  *She has one more week in POC, so she can schedule if needed one more week before goals due 04/29/2021    Consulted and Agree with Plan of Care Patient             Patient will benefit from skilled therapeutic intervention in order to improve the following deficits and impairments:  Dizziness, Decreased balance, Decreased mobility  Visit Diagnosis: Unsteadiness on feet  Dizziness and giddiness     Problem List Patient Active Problem List   Diagnosis Date Noted   Uncomplicated opioid dependence ([place]) 08/02/2018   Stiffness of left knee 04/12/2018   S/P left TKA 02/05/2018   S/P knee replacement 02/05/2018   Presbycusis of both ears 10/16/2016    S/P right TKA 01/25/2015   Benign paroxysmal positional vertigo 02/09/2014   Somnambulism 10/27/2013   Essential hypertension, benign 07/17/2012   Mixed hyperlipidemia 07/17/2012   Migraines 07/17/2012   Idiopathic peripheral neuropathy 07/17/2012   Hypokalemia 07/17/2012    [name], PT 04/14/2021, 12:51 PM  [facility] [address], [place], [place], [address] Phone: [phone]   Fax:  [phone]  Name: Brittany Mercer MRN: [id number] Date of Birth: Mar 03, 1946  [facility] [address], [place], [place], [address] Phone: [phone]   Fax:  [phone]  Physical Therapy Treatment  Patient Details  Name: Brittany Mercer MRN: [id number] Date of Birth: January 14, 1946 Referring Provider (PT): Claretta Fraise, MD   Encounter Date: 04/14/2021   PT End of Session - 04/14/21 1239     Visit Number 4    Number of Visits 8    Date for PT Re-Evaluation 04/29/21    Authorization Type Medicare/[employer]    PT Start Time [phone]    PT Stop Time 1013    PT Time Calculation (min) 39 min    Equipment Utilized During Treatment --    Activity Tolerance Patient tolerated treatment well    Behavior During Therapy WFL for tasks assessed/performed             Past Medical History:  Diagnosis Date   Cataract    Chronic narcotic use    takes Nucynta for neuropathic pain   Headache    Hereditary and idiopathic peripheral neuropathy    followed by pcp   Hyperlipidemia    Hypertension    Hypokalemia    OA (osteoarthritis)    LEFT KNEE   Peripheral neuropathy    Wears glasses     Past Surgical History:  Procedure Laterality Date   ABDOMINAL HYSTERECTOMY  1980s   W/ UNILATERAL SALPINGOOPHORECTOMY   CATARACT EXTRACTION Right    CESAREAN SECTION  x2   last one [place] Right 05/22/2019   EYE SURGERY     KNEE ARTHROSCOPY Right x2  last one 2016   KNEE CLOSED REDUCTION Left 04/11/2018   Procedure: CLOSED MANIPULATION KNEE;  Surgeon: Paralee Cancel, MD;  Location: [facility];  Service: Orthopedics;  Laterality: Left;   SHOULDER OPEN ROTATOR CUFF REPAIR Left 1990s   TONSILLECTOMY  age 76   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   Procedure: RIGHT TOTAL KNEE ARTHROPLASTY;  Surgeon: Paralee Cancel, MD;  Location: [facility];  Service: Orthopedics;  Laterality: Right;   TOTAL KNEE ARTHROPLASTY Left 02/05/2018   Procedure: LEFT TOTAL KNEE ARTHROPLASTY;  Surgeon: Paralee Cancel, MD;   Location: [facility];  Service: Orthopedics;  Laterality: Left;  70 mins    There were no vitals filed for this visit.   Subjective Assessment - 04/14/21 0938     Subjective Has had one mild episode with a quick turn, head was spinning and it went away very quickly.    Patient Stated Goals Know what to do when the dizziness comes on    Currently in Pain? No/denies                  Reviewed HEP from last visit:  Standing gaze stabilization with head nods 30 sec, head turns 30 sec Standing VOR cancellation horizontal, 30 seconds-rates symptoms as 1/10 Pt return demo understanding.             [place] Adult PT Treatment/Exercise - 04/14/21 0001       Therapeutic Activites    Therapeutic Activities Other Therapeutic Activities    Other Therapeutic Activities Pt mentions feeling one leg is longer than other.  Initial postural assessment at bony prominences:  iliac crest, PSIS, patella-no difference.  With lying supine on mat, after bridging 3 reps, LLE appears longer than RLE.  Measured from greater trochanter to medial malleolus:  LLE 86 cm, RLE 85 cm.  Discussed option for slight OTC insert

## 2021-04-19 ENCOUNTER — Other Ambulatory Visit: Payer: Self-pay

## 2021-04-19 ENCOUNTER — Encounter: Payer: Self-pay | Admitting: Physical Therapy

## 2021-04-19 ENCOUNTER — Ambulatory Visit: Payer: Medicare Other | Admitting: Physical Therapy

## 2021-04-19 DIAGNOSIS — R2681 Unsteadiness on feet: Secondary | ICD-10-CM | POA: Diagnosis not present

## 2021-04-19 DIAGNOSIS — H8113 Benign paroxysmal vertigo, bilateral: Secondary | ICD-10-CM | POA: Diagnosis not present

## 2021-04-19 DIAGNOSIS — R42 Dizziness and giddiness: Secondary | ICD-10-CM | POA: Diagnosis not present

## 2021-04-19 NOTE — Therapy (Signed)
[facility] [address] [place], [place], [address] Phone: [phone]   Fax:  [phone]  Physical Therapy Discharge Summary  Patient Details  Name: Genee Rann MRN: [id number] Date of Birth: [birth year] Referring Provider (PT): Claretta Fraise, MD  Progress Note Reporting Period 03/31/21 to 04/19/21  See note below for Objective Data and Assessment of Progress/Goals.      Encounter Date: 04/19/2021   PT End of Session - 04/19/21 1013     Visit Number 5    Number of Visits 8    Date for PT Re-Evaluation 04/29/21    Authorization Type Medicare/[employer]    PT Start Time [phone]    PT Stop Time 1011    PT Time Calculation (min) 40 min    Equipment Utilized During Treatment Gait belt    Activity Tolerance Patient tolerated treatment well    Behavior During Therapy WFL for tasks assessed/performed             Past Medical History:  Diagnosis Date   Cataract    Chronic narcotic use    takes Nucynta for neuropathic pain   Headache    Hereditary and idiopathic peripheral neuropathy    followed by pcp   Hyperlipidemia    Hypertension    Hypokalemia    OA (osteoarthritis)    LEFT KNEE   Peripheral neuropathy    Wears glasses     Past Surgical History:  Procedure Laterality Date   ABDOMINAL HYSTERECTOMY  1980s   W/ UNILATERAL SALPINGOOPHORECTOMY   CATARACT EXTRACTION Right    CESAREAN SECTION  x2   last one [place] Right 05/22/2019   EYE SURGERY     KNEE ARTHROSCOPY Right x2  last one 2016   KNEE CLOSED REDUCTION Left 04/11/2018   Procedure: CLOSED MANIPULATION KNEE;  Surgeon: Paralee Cancel, MD;  Location: [facility];  Service: Orthopedics;  Laterality: Left;   SHOULDER OPEN ROTATOR CUFF REPAIR Left 1990s   TONSILLECTOMY  age 34   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   Procedure: RIGHT TOTAL KNEE ARTHROPLASTY;  Surgeon: Paralee Cancel, MD;  Location: [facility];  Service:  Orthopedics;  Laterality: Right;   TOTAL KNEE ARTHROPLASTY Left 02/05/2018   Procedure: LEFT TOTAL KNEE ARTHROPLASTY;  Surgeon: Paralee Cancel, MD;  Location: [facility];  Service: Orthopedics;  Laterality: Left;  70 mins    There were no vitals filed for this visit.   Subjective Assessment - 04/19/21 0931     Subjective Has been doing her HEP at home. Feels like she is ready to wrap up with therapy today as she is not longer having dizziness with her HEP and no longer having dizziness during ADLs.    Patient Stated Goals Know what to do when the dizziness comes on    Currently in Pain? No/denies                [facility] PT Assessment - 04/19/21 0935       Assessment   Medical Diagnosis BPPV    Referring Provider (PT) Claretta Fraise, MD    Onset Date/Surgical Date 01/24/21      High Level Balance   High Level Balance Comments Stand EC solid surface 9 sec, EC on foam with wide stance 5 sec (unable to perform romberg d/t fear)      Functional Gait  Assessment   Gait Level Surface Walks 20 ft in less than 7 sec but greater  than 5.5 sec, uses assistive device, slower speed, mild gait deviations, or deviates 6-10 in outside of the 12 in walkway width.    Change in Gait Speed Able to smoothly change walking speed without loss of balance or gait deviation. Deviate no more than 6 in outside of the 12 in walkway width.    Gait with Horizontal Head Turns Performs head turns smoothly with slight change in gait velocity (eg, minor disruption to smooth gait path), deviates 6-10 in outside 12 in walkway width, or uses an assistive device.    Gait with Vertical Head Turns Performs head turns with no change in gait. Deviates no more than 6 in outside 12 in walkway width.    Gait and Pivot Turn Pivot turns safely within 3 sec and stops quickly with no loss of balance.    Step Over Obstacle Is able to step over one shoe box (4.5 in total height) without changing gait speed. No evidence of imbalance.     Gait with Narrow Base of Support Ambulates 4-7 steps.    Gait with Eyes Closed Walks 20 ft, slow speed, abnormal gait pattern, evidence for imbalance, deviates 10-15 in outside 12 in walkway width. Requires more than 9 sec to ambulate 20 ft.    Ambulating Backwards Walks 20 ft, uses assistive device, slower speed, mild gait deviations, deviates 6-10 in outside 12 in walkway width.    Steps Alternating feet, no rail.    Total Score 22                 Vestibular Assessment - 04/19/21 0001       Dix-Hallpike Right   Dix-Hallpike Right Duration None    Dix-Hallpike Right Symptoms No nystagmus      Dix-Hallpike Left   Dix-Hallpike Left Duration none    Dix-Hallpike Left Symptoms No nystagmus                       Vestibular Treatment/Exercise - 04/19/21 0001       X1 Viewing Horizontal   Foot Position romberg    Reps --   30"   Comments slow, mild sway      X1 Viewing Vertical   Foot Position romberg    Reps --   30"   Comments slow, mild sway                Balance Exercises - 04/19/21 0001       Balance Exercises: Standing   Standing Eyes Opened Foam/compliant surface;1 rep;30 secs;Wide (BOA)    Other Standing Exercises Comments wide stance + EC head turns/nod 30" each                PT Education - 04/19/21 1012     Education Details discussion on objective progress and remaining impairments; update to HEP-Access Code: [email or web address]    Person(s) Educated Patient    Methods Explanation;Demonstration;Tactile cues;Verbal cues;Handout    Comprehension Returned demonstration;Verbalized understanding                 PT Long Term Goals - 04/19/21 0936       PT LONG TERM GOAL #1   Title Pt will be independent with HEP for improved balance, decreased dizziness.  TARGET 04/29/2021    Time 5    Period Weeks    Status Achieved      PT LONG TERM GOAL #2   Title The patient will have negative dix hallpike test indicating resolution  of  BPPV.    Baseline mild symptoms initially to L (1-2/10)    Time 4    Period Weeks    Status Achieved      PT LONG TERM GOAL #3   Title FGA score to improve to at least 22/30 for decreased fall risk.    Baseline 18/301/11/2021    Time 5    Period Weeks    Status Achieved      PT LONG TERM GOAL #4   Title Pt will perform standing with Eyes closed on solid/compliant surfaces for 15 seconds to demonstrate improved vestibular system use for balance.    Baseline 10 sec; 4.9 sec 04/04/2021    Time 5    Period Weeks    Status Not Met   limited by fear vs. imbalance                  Plan - 04/19/21 1013     Clinical Impression Statement Patient arrived to session with report of resolution of dizziness during HEP and ADLs. Notes that she feels ready to wrap up with therapy at this time. Positional testing was negative. Balance with EC is still limited by fear of falling, however patient scored 22/30 on FGA, indicating a decreased risk of falls. Worked on progressing balance activities with EC and on foam to address remaining balance deficits. Patient still limited by fear of falling, however this improved with very light finger support on counter. Patient reported understanding of HEP and without complaints at end of session. Patient has met most goals at this time and is ready for DC with transition to HEP.    Personal Factors and Comorbidities Comorbidity 3+    Comorbidities PMH:  peripheral neuropathy, HTN, HLD    Examination-Activity Limitations Locomotion Level;Bed Mobility    Stability/Clinical Decision Making Stable/Uncomplicated    Rehab Potential Good    PT Frequency 2x / week    PT Duration 4 weeks   plus eval visit (5 weeks total POC)   PT Treatment/Interventions Canalith Repostioning;Functional mobility training;Therapeutic activities;Therapeutic exercise;Balance training;Neuromuscular re-education;Manual techniques;Passive range of motion;Vestibular;Patient/family education     PT Next Visit Plan DC at this time    Consulted and Agree with Plan of Care Patient             Patient will benefit from skilled therapeutic intervention in order to improve the following deficits and impairments:  Dizziness, Decreased balance, Decreased mobility  Visit Diagnosis: Unsteadiness on feet  Dizziness and giddiness     Problem List Patient Active Problem List   Diagnosis Date Noted   Uncomplicated opioid dependence ([place]) 08/02/2018   Stiffness of left knee 04/12/2018   S/P left TKA 02/05/2018   S/P knee replacement 02/05/2018   Presbycusis of both ears 10/16/2016   S/P right TKA 01/25/2015   Benign paroxysmal positional vertigo 02/09/2014   Somnambulism 10/27/2013   Essential hypertension, benign 07/17/2012   Mixed hyperlipidemia 07/17/2012   Migraines 07/17/2012   Idiopathic peripheral neuropathy 07/17/2012   Hypokalemia 07/17/2012    PHYSICAL THERAPY DISCHARGE SUMMARY  Visits from Start of Care: 5  Current functional level related to goals / functional outcomes: See above clinical impression   Remaining deficits: Imbalance on foam/EC   Education / Equipment: HEP  Plan: Patient agrees to discharge.  Patient goals were partially met. Patient is being discharged d/t satisfaction with CLOF.     Janene Harvey, PT, DPT 04/19/21 12:50 PM   [facility] [address], [place], [place], [address] Phone: [phone]   Fax:  [phone]  Name: Sierah Lacewell MRN: [id number] Date of Birth: October 03, 1945

## 2021-04-21 ENCOUNTER — Ambulatory Visit: Payer: Medicare Other | Admitting: Physical Therapy

## 2021-04-27 ENCOUNTER — Ambulatory Visit (INDEPENDENT_AMBULATORY_CARE_PROVIDER_SITE_OTHER): Payer: Medicare Other | Admitting: Family Medicine

## 2021-04-27 ENCOUNTER — Encounter: Payer: Self-pay | Admitting: Family Medicine

## 2021-04-27 VITALS — BP 153/71 | HR 66 | Temp 97.7°F | Ht 69.0 in | Wt 192.0 lb

## 2021-04-27 DIAGNOSIS — I10 Essential (primary) hypertension: Secondary | ICD-10-CM

## 2021-04-27 DIAGNOSIS — Z79899 Other long term (current) drug therapy: Secondary | ICD-10-CM

## 2021-04-27 DIAGNOSIS — Z23 Encounter for immunization: Secondary | ICD-10-CM

## 2021-04-27 DIAGNOSIS — G609 Hereditary and idiopathic neuropathy, unspecified: Secondary | ICD-10-CM

## 2021-04-27 DIAGNOSIS — E782 Mixed hyperlipidemia: Secondary | ICD-10-CM

## 2021-04-27 MED ORDER — TAPENTADOL HCL 100 MG PO TABS: 100.0000 mg | ORAL_TABLET | Freq: Four times a day (QID) | ORAL | 0 refills | Status: DC

## 2021-04-27 NOTE — Progress Notes (Signed)
Subjective:  Patient ID: Brittany Mercer, female    DOB: October 03, 1945  Age: 76 y.o. MRN: [id number]  CC: Medical Management of Chronic Issues   HPI Brittany Mercer presents for quarterly checkup for her neuropathy.  She reports that she has tingling and burning with numbness in the feet.  It is worse at night.  She does get moderately relief from the tapentadol.  She has been on the same dose for many years.  She has to pay cash for it since her insurance quit covering it a few years ago.PDMP review shows appropriate utilization of the patient's controlled medications.  Her morphine milligram equivalent per day is 160 based on the use of 400 mg of tapentadol daily.  The medication was last filled on December 30.  She is about out but has a couple leftover from times when she missed a dose but she is getting close.  Of note is that she is due for an update of her pain agreement today.  That was signed in my presence and she is going to do a tox assure drug screen shortly.   Follow-up of hypertension. Patient has no history of headache chest pain or shortness of breath or recent cough. Patient also denies symptoms of TIA such as numbness weakness lateralizing. Patient checks  blood pressure at home and has not had any elevated readings recently. Patient denies side effects from his medication. States taking it regularly.  Patient relates that she does have a migraine today and she did not sleep very well last night.  Other blood pressure checks have been in normal range.  She agrees to check regularly and if she does not achieve a goal of 130/80 or below while at rest for most of her readings she will let me know.  Depression screen [facility] 2/9 04/27/2021 01/24/2021 10/19/2020  Decreased Interest 0 0 0  Down, Depressed, Hopeless 0 0 0  PHQ - 2 Score 0 0 0  Altered sleeping - - -  Tired, decreased energy - - -  Change in appetite - - -  Feeling bad or failure about yourself  - - -  Trouble  concentrating - - -  Moving slowly or fidgety/restless - - -  Suicidal thoughts - - -  PHQ-9 Score - - -  Difficult doing work/chores - - -  Some recent data might be hidden    History Brittany Mercer has a past medical history of Cataract, Chronic narcotic use, Headache, Hereditary and idiopathic peripheral neuropathy, Hyperlipidemia, Hypertension, Hypokalemia, OA (osteoarthritis), Peripheral neuropathy, and Wears glasses.   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   Her family history includes Arthritis in her brother; Dementia in her father; Heart disease in her mother; Hyperlipidemia in her mother; Hypertension in her brother, father, and mother; Neuropathy in her father; Stroke in her mother.She reports that she quit smoking about 22 years ago. Her smoking use included cigarettes. She has a 15.00 pack-year smoking history. She has never used smokeless tobacco. She reports current alcohol use of about 4.0 standard drinks per week. She reports that she does not use drugs.    ROS Review of Systems  Constitutional: Negative.  HENT: Negative.    Eyes:  Negative for visual disturbance.  Respiratory:  Negative for shortness of breath.   Cardiovascular:  Negative for chest pain.  Gastrointestinal:  Negative for abdominal pain.  Musculoskeletal:  Negative for arthralgias.  Neurological:  Positive for numbness. Negative for tremors and weakness.   Objective:  BP (!) 153/71    Pulse 66    Temp 97.7 F (36.5 C)    Ht _0  (1.753 m)    Wt 192 lb (87.1 kg)    SpO2 98%    BMI 28.35 kg/m   BP  Readings from Last 3 Encounters:  04/27/21 (!) 153/71  01/24/21 (!) 159/75  10/19/20 134/79    Wt Readings from Last 3 Encounters:  04/27/21 192 lb (87.1 kg)  01/24/21 192 lb (87.1 kg)  10/19/20 186 lb 12.8 oz (84.7 kg)     Physical Exam Constitutional:      General: She is not in acute distress.    Appearance: She is well-developed.  Cardiovascular:     Rate and Rhythm: Normal rate and regular rhythm.  Pulmonary:     Breath sounds: Normal breath sounds.  Musculoskeletal:        General: Normal range of motion.  Skin:    General: Skin is warm and dry.  Neurological:     Mental Status: She is alert and oriented to person, place, and time.      Assessment & Plan:   Brittany Mercer was seen today for medical management of chronic issues.  Diagnoses and all orders for this visit:  Essential hypertension, benign -     CMP14+EGFR -     CBC with Differential/Platelet  Mixed hyperlipidemia -     Lipid panel  Idiopathic peripheral neuropathy -     Tapentadol HCl 100 MG TABS; Take 1 tablet (100 mg total) by mouth 4 (four) times daily. -     Tapentadol HCl 100 MG TABS; Take 1 tablet (100 mg total) by mouth 4 (four) times daily for 5 days. -     Tapentadol HCl 100 MG TABS; Take 1 tablet (100 mg total) by mouth 4 (four) times daily.  Controlled substance agreement signed -     ToxASSURE Select 13 (MW), Urine  Need for pneumococcal vaccination -     Pneumococcal conjugate vaccine 20-valent (Prevnar 20)       I have discontinued Rejeana Brock. [name]'s zaleplon. I am also having her maintain her loratadine, ibuprofen, prednisoLONE acetate, ondansetron, meclizine, fenofibrate, metoprolol succinate, potassium chloride, estradiol, Tapentadol HCl, Tapentadol HCl, and Tapentadol HCl.  Allergies as of 04/27/2021       Reactions   Statins Other (See Comments)   Myalgia, turns red   Penicillins Rash, Other (See Comments)   Pt not sure if she is allergic to penicillin or  sulfa. So she does not take either one.  DID THE REACTION INVOLVE: Swelling of the face/tongue/throat, SOB, or low BP? Unknown Sudden or severe rash/hives, skin peeling, or the inside of the mouth or nose? No Did it require medical treatment? No When did it last happen?      childhood allergy If all above answers are NO, may proceed with cephalosporin use.   Sulfa Antibiotics Rash, Other (See Comments)   Pt not sure if she is allergic to sulfa or penicillin so she doesn't take either one.         Medication List        Accurate as of April 27, 2021  11:51 AM. If you have any questions, ask your nurse or doctor.          STOP taking these medications    zaleplon 10 MG capsule Commonly known as: SONATA Stopped by: Claretta Fraise, MD       TAKE these medications    estradiol 0.1 MG/GM vaginal cream Commonly known as: ESTRACE Place 1 Applicatorful vaginally once a week. At bed time   fenofibrate 160 MG tablet Take 1 tablet (160 mg total) by mouth daily.   ibuprofen 200 MG tablet Commonly known as: ADVIL Take 600 mg by mouth daily as needed for moderate pain.   loratadine 10 MG tablet Commonly known as: CLARITIN Take 10 mg by mouth daily at 2 PM.   meclizine 25 MG tablet Commonly known as: ANTIVERT Take 1 tablet (25 mg total) by mouth 3 (three) times daily as needed for dizziness.   metoprolol succinate 50 MG 24 hr tablet Commonly known as: TOPROL-XL TAKE ONE TABLET BY MOUTH WITH OR IMMEDIATELY FOLLOWING A MEAL ONCE DAILY   ondansetron 8 MG disintegrating tablet Commonly known as: ZOFRAN-ODT Take 1 tablet (8 mg total) by mouth every 6 (six) hours as needed for nausea or vomiting.   potassium chloride 10 MEQ tablet Commonly known as: KLOR-CON TAKE 2 TO 3 TABLETS BY MOUTH DAILY AS DIRECTED   prednisoLONE acetate 1 % ophthalmic suspension Commonly known as: PRED FORTE Place 1 drop into the right eye 4 (four) times daily.   Tapentadol HCl 100 MG Tabs Take  1 tablet (100 mg total) by mouth 4 (four) times daily. What changed: Another medication with the same name was changed. Make sure you understand how and when to take each. Changed by: Claretta Fraise, MD   Tapentadol HCl 100 MG Tabs Take 1 tablet (100 mg total) by mouth 4 (four) times daily for 5 days. Start taking on: May 27, 2021 What changed: These instructions start on May 27, 2021. If you are unsure what to do until then, ask your doctor or other care provider. Changed by: Claretta Fraise, MD   Tapentadol HCl 100 MG Tabs Take 1 tablet (100 mg total) by mouth 4 (four) times daily. Start taking on: June 26, 2021 What changed: These instructions start on June 26, 2021. If you are unsure what to do until then, ask your doctor or other care provider. Changed by: Claretta Fraise, MD         Follow-up: Return in about 3 months (around 07/25/2021).  Claretta Fraise, M.D.

## 2021-05-02 LAB — TOXASSURE SELECT 13 (MW), URINE

## 2021-05-31 ENCOUNTER — Encounter: Payer: Self-pay | Admitting: Family Medicine

## 2021-05-31 ENCOUNTER — Other Ambulatory Visit: Payer: Self-pay | Admitting: Family Medicine

## 2021-05-31 DIAGNOSIS — G609 Hereditary and idiopathic neuropathy, unspecified: Secondary | ICD-10-CM

## 2021-05-31 MED ORDER — TAPENTADOL HCL 100 MG PO TABS: 100.0000 mg | ORAL_TABLET | Freq: Four times a day (QID) | ORAL | 0 refills | Status: DC

## 2021-07-18 DIAGNOSIS — Z20822 Contact with and (suspected) exposure to covid-19: Secondary | ICD-10-CM | POA: Diagnosis not present

## 2021-07-19 ENCOUNTER — Ambulatory Visit (INDEPENDENT_AMBULATORY_CARE_PROVIDER_SITE_OTHER): Payer: Medicare Other

## 2021-07-19 VITALS — Wt 187.0 lb

## 2021-07-19 DIAGNOSIS — Z0000 Encounter for general adult medical examination without abnormal findings: Secondary | ICD-10-CM | POA: Diagnosis not present

## 2021-07-19 NOTE — Progress Notes (Signed)
? ?Subjective:  ? Brittany Mercer is a 76 y.o. female who presents for Medicare Annual (Subsequent) preventive examination. ? ?Virtual Visit via Telephone Note ? ?I connected with  [name] on 07/19/21 at  1:15 PM EDT by telephone and verified that I am speaking with the correct person using two identifiers. ? ?Location: ?Patient: Home ?Provider: [facility] ?Persons participating in the virtual visit: patient/Nurse Health Advisor ?  ?I discussed the limitations, risks, security and privacy concerns of performing an evaluation and management service by telephone and the availability of in person appointments. The patient expressed understanding and agreed to proceed. ? ?Interactive audio and video telecommunications were attempted between this nurse and patient, however failed, due to patient having technical difficulties OR patient did not have access to video capability.  We continued and completed visit with audio only. ? ?Some vital signs may be absent or patient reported.  ? ?Devota Viruet Dionne Ano, LPN  ? ?Review of Systems    ? ?Cardiac Risk Factors include: advanced age (>71mn, >>68women);dyslipidemia;hypertension;sedentary lifestyle ? ?   ?Objective:  ?  ?Today's Vitals  ? 07/19/21 1318 07/19/21 1319  ?Weight: 187 lb (84.8 kg)   ?PainSc:  3   ? ?Body mass index is 27.62 kg/m?. ? ? ?  07/19/2021  ?  1:27 PM 03/31/2021  ?  9:37 AM 07/16/2020  ?  4:40 PM 06/09/2019  ?  2:28 PM 06/06/2018  ?  4:06 PM 04/02/2018  ?  5:23 PM 02/05/2018  ?  1:35 PM  ?Advanced Directives  ?Does Patient Have a Medical Advance Directive? Yes Yes Yes Yes Yes Yes Yes  ?Type of AParamedicof [place]Living will [place]Living will H[place]ust ValleyLiving will HMamersLiving will HMinookaLiving will  H[place]omonieLiving will  ?Does patient want to make changes to medical advance directive?  No - Patient declined  No -  Patient declined No - Patient declined  No - Patient declined  ?Copy of HGaltin Chart? No - copy requested  No - copy requested No - copy requested No - copy requested  No - copy requested  ? ? ?Current Medications (verified) ?Outpatient Encounter Medications as of 07/19/2021  ?Medication Sig  ? estradiol (ESTRACE) 0.1 MG/GM vaginal cream Place 1 Applicatorful vaginally once a week. At bed time  ? fenofibrate 160 MG tablet Take 1 tablet (160 mg total) by mouth daily.  ? metoprolol succinate (TOPROL-XL) 50 MG 24 hr tablet TAKE ONE TABLET BY MOUTH WITH OR IMMEDIATELY FOLLOWING A MEAL ONCE DAILY  ? potassium chloride (KLOR-CON) 10 MEQ tablet TAKE 2 TO 3 TABLETS BY MOUTH DAILY AS DIRECTED  ? prednisoLONE acetate (PRED FORTE) 1 % ophthalmic suspension Place 1 drop into the right eye 4 (four) times daily.  ? Tapentadol HCl 100 MG TABS Take 1 tablet (100 mg total) by mouth 4 (four) times daily.  ? ibuprofen (ADVIL,MOTRIN) 200 MG tablet Take 600 mg by mouth daily as needed for moderate pain. (Patient not taking: Reported on 07/19/2021)  ? loratadine (CLARITIN) 10 MG tablet Take 10 mg by mouth daily at 2 PM. (Patient not taking: Reported on 07/19/2021)  ? meclizine (ANTIVERT) 25 MG tablet Take 1 tablet (25 mg total) by mouth 3 (three) times daily as needed for dizziness. (Patient not taking: Reported on 07/19/2021)  ? [DISCONTINUED] ondansetron (ZOFRAN-ODT) 8 MG disintegrating tablet Take 1 tablet (8 mg total) by mouth every  6 (six) hours as needed for nausea or vomiting. (Patient not taking: Reported on 07/19/2021)  ? [DISCONTINUED] Tapentadol HCl 100 MG TABS Take 1 tablet (100 mg total) by mouth 4 (four) times daily.  ? [DISCONTINUED] Tapentadol HCl 100 MG TABS Take 1 tablet (100 mg total) by mouth 4 (four) times daily for 25 days.  ? ?No facility-administered encounter medications on file as of 07/19/2021.  ? ? ?Allergies (verified) ?Statins, Penicillins, and Sulfa antibiotics  ? ?History: ?Past Medical  History:  ?Diagnosis Date  ? Cataract   ? Chronic narcotic use   ? takes Nucynta for neuropathic pain  ? Headache   ? Hereditary and idiopathic peripheral neuropathy   ? followed by pcp  ? Hyperlipidemia   ? Hypertension   ? Hypokalemia   ? OA (osteoarthritis)   ? LEFT KNEE  ? Peripheral neuropathy   ? Wears glasses   ? ?Past Surgical History:  ?Procedure Laterality Date  ? ABDOMINAL HYSTERECTOMY  1980s  ? W/ UNILATERAL SALPINGOOPHORECTOMY  ? CATARACT EXTRACTION Right   ? CESAREAN SECTION  x2   last one 1975  ? 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 EYE SURGERY    ? KNEE ARTHROSCOPY Right x2  last one 2016  ? KNEE CLOSED REDUCTION Left 04/11/2018  ? Procedure: CLOSED MANIPULATION KNEE;  Surgeon: Paralee Cancel, MD;  Location: [facility];  Service: Orthopedics;  Laterality: Left;  ? SHOULDER OPEN ROTATOR CUFF REPAIR Left 1990s  ? TONSILLECTOMY  age 86  ? 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  ? Procedure: RIGHT TOTAL KNEE ARTHROPLASTY;  Surgeon: Paralee Cancel, MD;  Location: [facility];  Service: Orthopedics;  Laterality: Right;  ? TOTAL KNEE ARTHROPLASTY Left 02/05/2018  ? Procedure: LEFT TOTAL KNEE ARTHROPLASTY;  Surgeon: Paralee Cancel, MD;  Location: [facility];  Service: Orthopedics;  Laterality: Left;  70 mins  ? ?Family History  ?Problem Relation Age of Onset  ? Hypertension Mother   ? Hyperlipidemia Mother   ? Stroke Mother   ? Heart disease Mother   ? Hypertension Father   ? Dementia Father   ? Neuropathy Father   ? Hypertension Brother   ? Arthritis Brother   ? ?Social History  ? ?Socioeconomic History  ? Marital status: Widowed  ?  Spouse name: Not on file  ? Number of children: 2  ? Years of education: college  ? Highest education level: Bachelor's degree (e.g., BA, AB, BS)  ?Occupational History  ? Occupation: Retired  ? Occupation: Engineer, site  ?Tobacco Use  ? Smoking status: Former  ?  Packs/day: 0.50  ?  Years: 30.00  ?  Pack years: 15.00  ?  Types: Cigarettes  ?  Quit date: 07/18/1998  ?  Years since  quitting: 23.0  ? Smokeless tobacco: Never  ?Vaping Use  ? Vaping Use: Never used  ?Substance and Sexual Activity  ? Alcohol use: Yes  ?  Alcohol/week: 4.0 standard drinks  ?  Types: 4 Glasses of wine per week  ?  Comment: per year  ? Drug use: Never  ? Sexual activity: Not Currently  ?  Birth control/protection: Surgical  ?Other Topics Concern  ? Not on file  ?Social History Narrative  ? Lives alone. Has 2 adult sons and 4 grandchildren.  ? ?Social Determinants of Health  ? ?Financial Resource Strain: Low Risk   ? Difficulty of Paying Living Expenses: Not hard at all  ?Food Insecurity: No Food Insecurity  ? Worried About Charity fundraiser  in the Last Year: Never true  ? Ran Out of Food in the Last Year: Never true  ?Transportation Needs: No Transportation Needs  ? Lack of Transportation (Medical): No  ? Lack of Transportation (Non-Medical): No  ?Physical Activity: Insufficiently Active  ? Days of Exercise per Week: 3 days  ? Minutes of Exercise per Session: 40 min  ?Stress: No Stress Concern Present  ? Feeling of Stress : Only a little  ?Social Connections: Moderately Integrated  ? Frequency of Communication with Friends and Family: More than three times a week  ? Frequency of Social Gatherings with Friends and Family: More than three times a week  ? Attends Religious Services: More than 4 times per year  ? Active Member of Clubs or Organizations: Yes  ? Attends Archivist Meetings: More than 4 times per year  ? Marital Status: Widowed  ? ? ?Tobacco Counseling ?Counseling given: Not Answered ? ? ?Clinical Intake: ? ?Pre-visit preparation completed: Yes ? ?Pain : 0-10 ?Pain Score: 3  ?Pain Type: Neuropathic pain ?Pain Location: Foot ?Pain Orientation: Right, Left ?Pain Descriptors / Indicators: Burning, Pins and needles ?Pain Onset: More than a month ago ?Pain Frequency: Intermittent ? ?  ? ?BMI - recorded: 27.62 ?Nutritional Status: BMI 25 -29 Overweight ?Nutritional Risks: None ?Diabetes: No ? ?How  often do you need to have someone help you when you read instructions, pamphlets, or other written materials from your doctor or pharmacy?: 1 - Never ? ?Diabetic? NO ? ?Interpreter Needed?: No ? ?Information ent

## 2021-07-19 NOTE — Patient Instructions (Signed)
Brittany Mercer , ?Thank you for taking time to come for your Medicare Wellness Visit. I appreciate your ongoing commitment to your health goals. Please review the following plan we discussed and let me know if I can assist you in the future.  ? ?Screening recommendations/referrals: ?Colonoscopy: Done 06/26/2010 - Recommended Repeat in 10 years *discuss with Dr Livia Snellen ?Mammogram: Done 11/05/2020 - Repeat annually  ?Bone Density: Done 12/31/2013 - recommend repeat every 2-5 years ?Recommended yearly ophthalmology/optometry visit for glaucoma screening and checkup ?Recommended yearly dental visit for hygiene and checkup ? ?Vaccinations: ?Influenza vaccine: Done 01/14/2021 - Repeat annually ?Pneumococcal vaccine: Done 04/27/2021 & 07/17/2012 ?Tdap vaccine: Done 05/14/2017 - Repeat in 10 years ?Shingles vaccine: Done 06/06/2018 & 10/19/2020 ?Covid-19: Done 05/03/2019, 05/31/2019, & 01/22/2020 ? ?Advanced directives: Please bring a copy of your health care power of attorney and living will to the office to be added to your chart at your convenience.  ? ?Conditions/risks identified: Aim for 30 minutes of exercise or brisk walking, 6-8 glasses of water, and 5 servings of fruits and vegetables each day.  ? ?Next appointment: Follow up in one year for your annual wellness visit  ? ? ?Preventive Care 15 Years and Older, Female ?Preventive care refers to lifestyle choices and visits with your health care provider that can promote health and wellness. ?What does preventive care include? ?A yearly physical exam. This is also called an annual well check. ?Dental exams once or twice a year. ?Routine eye exams. Ask your health care provider how often you should have your eyes checked. ?Personal lifestyle choices, including: ?Daily care of your teeth and gums. ?Regular physical activity. ?Eating a healthy diet. ?Avoiding tobacco and drug use. ?Limiting alcohol use. ?Practicing safe sex. ?Taking low-dose aspirin every day. ?Taking vitamin and mineral  supplements as recommended by your health care provider. ?What happens during an annual well check? ?The services and screenings done by your health care provider during your annual well check will depend on your age, overall health, lifestyle risk factors, and family history of disease. ?Counseling  ?Your health care provider may ask you questions about your: ?Alcohol use. ?Tobacco use. ?Drug use. ?Emotional well-being. ?Home and relationship well-being. ?Sexual activity. ?Eating habits. ?History of falls. ?Memory and ability to understand (cognition). ?Work and work Statistician. ?Reproductive health. ?Screening  ?You may have the following tests or measurements: ?Height, weight, and BMI. ?Blood pressure. ?Lipid and cholesterol levels. These may be checked every 5 years, or more frequently if you are over 41 years old. ?Skin check. ?Lung cancer screening. You may have this screening every year starting at age 74 if you have a 30-pack-year history of smoking and currently smoke or have quit within the past 15 years. ?Fecal occult blood test (FOBT) of the stool. You may have this test every year starting at age 73. ?Flexible sigmoidoscopy or colonoscopy. You may have a sigmoidoscopy every 5 years or a colonoscopy every 10 years starting at age 68. ?Hepatitis C blood test. ?Hepatitis B blood test. ?Sexually transmitted disease (STD) testing. ?Diabetes screening. This is done by checking your blood sugar (glucose) after you have not eaten for a while (fasting). You may have this done every 1-3 years. ?Bone density scan. This is done to screen for osteoporosis. You may have this done starting at age 13. ?Mammogram. This may be done every 1-2 years. Talk to your health care provider about how often you should have regular mammograms. ?Talk with your health care provider about your test results,  treatment options, and if necessary, the need for more tests. ?Vaccines  ?Your health care provider may recommend certain  vaccines, such as: ?Influenza vaccine. This is recommended every year. ?Tetanus, diphtheria, and acellular pertussis (Tdap, Td) vaccine. You may need a Td booster every 10 years. ?Zoster vaccine. You may need this after age 56. ?Pneumococcal 13-valent conjugate (PCV13) vaccine. One dose is recommended after age 37. ?Pneumococcal polysaccharide (PPSV23) vaccine. One dose is recommended after age 75. ?Talk to your health care provider about which screenings and vaccines you need and how often you need them. ?This information is not intended to replace advice given to you by your health care provider. Make sure you discuss any questions you have with your health care provider. ?Document Released: 04/09/2015 Document Revised: 12/01/2015 Document Reviewed: 01/12/2015 ?Elsevier Interactive Patient Education ? 2017 [place]. ? ?Fall Prevention in the Home ?Falls can cause injuries. They can happen to people of all ages. There are many things you can do to make your home safe and to help prevent falls. ?What can I do on the outside of my home? ?Regularly fix the edges of walkways and driveways and fix any cracks. ?Remove anything that might make you trip as you walk through a door, such as a raised step or threshold. ?Trim any bushes or trees on the path to your home. ?Use bright outdoor lighting. ?Clear any walking paths of anything that might make someone trip, such as rocks or tools. ?Regularly check to see if handrails are loose or broken. Make sure that both sides of any steps have handrails. ?Any raised decks and porches should have guardrails on the edges. ?Have any leaves, snow, or ice cleared regularly. ?Use sand or salt on walking paths during winter. ?Clean up any spills in your garage right away. This includes oil or grease spills. ?What can I do in the bathroom? ?Use night lights. ?Install grab bars by the toilet and in the tub and shower. Do not use towel bars as grab bars. ?Use non-skid mats or decals in  the tub or shower. ?If you need to sit down in the shower, use a plastic, non-slip stool. ?Keep the floor dry. Clean up any water that spills on the floor as soon as it happens. ?Remove soap buildup in the tub or shower regularly. ?Attach bath mats securely with double-sided non-slip rug tape. ?Do not have throw rugs and other things on the floor that can make you trip. ?What can I do in the bedroom? ?Use night lights. ?Make sure that you have a light by your bed that is easy to reach. ?Do not use any sheets or blankets that are too big for your bed. They should not hang down onto the floor. ?Have a firm chair that has side arms. You can use this for support while you get dressed. ?Do not have throw rugs and other things on the floor that can make you trip. ?What can I do in the kitchen? ?Clean up any spills right away. ?Avoid walking on wet floors. ?Keep items that you use a lot in easy-to-reach places. ?If you need to reach something above you, use a strong step stool that has a grab bar. ?Keep electrical cords out of the way. ?Do not use floor polish or wax that makes floors slippery. If you must use wax, use non-skid floor wax. ?Do not have throw rugs and other things on the floor that can make you trip. ?What can I do with my stairs? ?Do  not leave any items on the stairs. ?Make sure that there are handrails on both sides of the stairs and use them. Fix handrails that are broken or loose. Make sure that handrails are as long as the stairways. ?Check any carpeting to make sure that it is firmly attached to the stairs. Fix any carpet that is loose or worn. ?Avoid having throw rugs at the top or bottom of the stairs. If you do have throw rugs, attach them to the floor with carpet tape. ?Make sure that you have a light switch at the top of the stairs and the bottom of the stairs. If you do not have them, ask someone to add them for you. ?What else can I do to help prevent falls? ?Wear shoes that: ?Do not have high  heels. ?Have rubber bottoms. ?Are comfortable and fit you well. ?Are closed at the toe. Do not wear sandals. ?If you use a stepladder: ?Make sure that it is fully opened. Do not climb a closed stepladder. ?M

## 2021-07-25 ENCOUNTER — Ambulatory Visit (INDEPENDENT_AMBULATORY_CARE_PROVIDER_SITE_OTHER): Payer: Medicare Other | Admitting: Family Medicine

## 2021-07-25 ENCOUNTER — Encounter: Payer: Self-pay | Admitting: Family Medicine

## 2021-07-25 VITALS — BP 135/75 | HR 62 | Temp 97.5°F | Ht 69.0 in | Wt 190.2 lb

## 2021-07-25 DIAGNOSIS — E782 Mixed hyperlipidemia: Secondary | ICD-10-CM

## 2021-07-25 DIAGNOSIS — G609 Hereditary and idiopathic neuropathy, unspecified: Secondary | ICD-10-CM | POA: Diagnosis not present

## 2021-07-25 DIAGNOSIS — I10 Essential (primary) hypertension: Secondary | ICD-10-CM

## 2021-07-25 MED ORDER — TAPENTADOL HCL 100 MG PO TABS: 100.0000 mg | ORAL_TABLET | Freq: Four times a day (QID) | ORAL | 0 refills | Status: DC

## 2021-07-25 NOTE — Progress Notes (Signed)
? ?Subjective:  ?Patient ID: Brittany Mercer, female    DOB: [birth year]  Age: 76 y.o. MRN: [id number] ? ?CC: Medical Management of Chronic Issues ? ? ?HPI ?Brittany Mercer presents for pain management. PAin 1/10 now. 3/10 later in the day. Gets severe if she misses a dose of tapentadol. PDMP review shows appropriate utilization of the patient's controlled medications. Pain related to chronic neuropathy causing intractable burning in the feet. Dependent for over a decade on opiate based pain relief.  ? ? ? ?  07/25/2021  ?  9:12 AM 07/25/2021  ?  8:59 AM 07/19/2021  ?  1:25 PM  ?Depression screen PHQ 2/9  ?Decreased Interest 0 0 0  ?Down, Depressed, Hopeless 0 0 0  ?PHQ - 2 Score 0 0 0  ?Altered sleeping 1    ?Tired, decreased energy 0    ?Change in appetite 0    ?Feeling bad or failure about yourself  0    ?Trouble concentrating 0    ?Moving slowly or fidgety/restless 0    ?Suicidal thoughts 0    ?PHQ-9 Score 1    ?Difficult doing work/chores Somewhat difficult    ? ? ?History ?Tona has a past medical history of Cataract, Chronic narcotic use, Headache, Hereditary and idiopathic peripheral neuropathy, Hyperlipidemia, Hypertension, Hypokalemia, OA (osteoarthritis), Peripheral neuropathy, and Wears glasses.  ? 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  ? ?Her family history includes Arthritis in her brother; Dementia in her father; Heart disease in her mother; Hyperlipidemia in her mother; Hypertension in her brother, father, and mother; Neuropathy in her father; Stroke in her mother.She reports that  she quit smoking about 23 years ago. Her smoking use included cigarettes. She has a 15.00 pack-year smoking history. She has never used smokeless tobacco. She reports current alcohol use of about 4.0 standard drinks per week. She reports that she does not use drugs. ? ? ? ?ROS ?Review of Systems  ?Constitutional: Negative.  Negative for fever.  ?HENT: Negative.  Negative for congestion, rhinorrhea and sore throat.   ?Eyes:  Negative for visual disturbance.  ?Respiratory:  Negative for cough and shortness of breath.   ?Cardiovascular:  Negative for chest pain and palpitations.  ?Gastrointestinal:  Negative for abdominal pain.  ?Musculoskeletal:  Positive for back pain (ocassional flare responds to ibuprofen, laying down to relax). Negative for arthralgias, joint swelling and myalgias.  ? ?Objective:  ?BP 135/75   Pulse 62   Temp (!) 97.5 ?F (36.4 ?C)   Ht _0  (1.753 m)   Wt 190 lb 3.2 oz (86.3 kg)   SpO2 98%   BMI 28.09 kg/m?  ? ?BP Readings from Last 3 Encounters:  ?07/25/21 135/75  ?04/27/21 (!) 153/71  ?01/24/21 (!) 159/75  ? ? ?Wt Readings from Last 3 Encounters:  ?07/25/21 190 lb 3.2 oz (86.3 kg)  ?07/19/21 187 lb (84.8 kg)  ?04/27/21 192 lb (87.1 kg)  ? ? ? ?Physical Exam ?Constitutional:   ?   General: She is not in acute distress. ?   Appearance: She is well-developed.  ?Cardiovascular:  ?   Rate and Rhythm: Normal rate and regular rhythm.  ?Pulmonary:  ?  Breath sounds: Normal breath sounds.  ?Musculoskeletal:     ?   General: Normal range of motion.  ?Skin: ?   General: Skin is warm and dry.  ?Neurological:  ?   Mental Status: She is alert and oriented to person, place, and time.  ? ? ? ? ?Assessment & Plan:  ? ?Kacy was seen today for medical management of chronic issues. ? ?Diagnoses and all orders for this visit: ? ?Mixed hyperlipidemia ?-     Lipid panel ? ?Essential hypertension, benign ?-     CBC with Differential/Platelet ?-     CMP14+EGFR ? ?Idiopathic peripheral neuropathy ?-      Tapentadol HCl 100 MG TABS; Take 1 tablet (100 mg total) by mouth 4 (four) times daily. ?-     Tapentadol HCl 100 MG TABS; Take 1 tablet (100 mg total) by mouth 4 (four) times daily. ?-     Tapentadol HCl 100 MG TABS; Take 1 tablet (100 mg total) by mouth 4 (four) times daily. ? ? ? ? ? ? ?I am having Sybella Harnish. [employer]" start on Tapentadol HCl and Tapentadol HCl. I am also having her maintain her loratadine, ibuprofen, prednisoLONE acetate, meclizine, fenofibrate, metoprolol succinate, potassium chloride, estradiol, and Tapentadol HCl. ? ?Allergies as of 07/25/2021   ? ?   Reactions  ? Statins Other (See Comments)  ? Myalgia, turns red  ? Penicillins Rash, Other (See Comments)  ? Pt not sure if she is allergic to penicillin or sulfa. So she does not take either one.  ?DID THE REACTION INVOLVE: Swelling of the face/tongue/throat, SOB, or low BP? Unknown ?Sudden or severe rash/hives, skin peeling, or the inside of the mouth or nose? No ?Did it require medical treatment? No ?When did it last happen?      childhood allergy ?If all above answers are ?NO?, may proceed with cephalosporin use.  ? Sulfa Antibiotics Rash, Other (See Comments)  ? Pt not sure if she is allergic to sulfa or penicillin so she doesn't take either one.   ? ?  ? ?  ?Medication List  ?  ? ?  ? Accurate as of Jul 25, 2021  9:39 PM. If you have any questions, ask your nurse or doctor.  ?  ?  ? ?  ? ?estradiol 0.1 MG/GM vaginal cream ?Commonly known as: ESTRACE ?Place 1 Applicatorful vaginally once a week. At bed time ?  ?fenofibrate 160 MG tablet ?Take 1 tablet (160 mg total) by mouth daily. ?  ?ibuprofen 200 MG tablet ?Commonly known as: ADVIL ?Take 600 mg by mouth daily as needed for moderate pain. ?  ?loratadine 10 MG tablet ?Commonly known as: CLARITIN ?Take 10 mg by mouth daily at 2 PM. ?  ?meclizine 25 MG tablet ?Commonly known as: ANTIVERT ?Take 1 tablet (25 mg total) by mouth 3 (three) times daily as needed for dizziness. ?  ?metoprolol  succinate 50 MG 24 hr tablet ?Commonly known as: TOPROL-XL ?TAKE ONE TABLET BY MOUTH WITH OR IMMEDIATELY FOLLOWING A MEAL ONCE DAILY ?  ?potassium chloride 10 MEQ tablet ?Commonly known as: KLOR-CON ?TAKE 2 TO 3 TABLETS BY MOUTH DAILY AS DIRECTED ?  ?prednisoLONE acetate 1 % ophthalmic suspension ?Commonly known as: PRED FORTE ?Place 1 drop into the right eye 4 (four) times daily. ?  ?Tapentadol HCl 100 MG Tabs ?Take 1 tablet (100 mg total) by mouth 4 (four) times daily. ?What changed: Another medication with the same name was added. Make sure you  understand how and when to take each. ?Changed by: Claretta Fraise, MD ?  ?Tapentadol HCl 100 MG Tabs ?Take 1 tablet (100 mg total) by mouth 4 (four) times daily. ?Start taking on: August 25, 2021 ?What changed: You were already taking a medication with the same name, and this prescription was added. Make sure you understand how and when to take each. ?Changed by: Claretta Fraise, MD ?  ?Tapentadol HCl 100 MG Tabs ?Take 1 tablet (100 mg total) by mouth 4 (four) times daily. ?Start taking on: September 24, 2021 ?What changed: You were already taking a medication with the same name, and this prescription was added. Make sure you understand how and when to take each. ?Changed by: Claretta Fraise, MD ?  ? ?  ? ? ? ?Follow-up: Return in about 3 months (around 10/25/2021). ? ?Claretta Fraise, M.D. ?

## 2021-07-26 ENCOUNTER — Other Ambulatory Visit: Payer: Medicare Other

## 2021-07-26 DIAGNOSIS — E782 Mixed hyperlipidemia: Secondary | ICD-10-CM | POA: Diagnosis not present

## 2021-07-26 DIAGNOSIS — I10 Essential (primary) hypertension: Secondary | ICD-10-CM | POA: Diagnosis not present

## 2021-07-27 DIAGNOSIS — Z20822 Contact with and (suspected) exposure to covid-19: Secondary | ICD-10-CM | POA: Diagnosis not present

## 2021-07-27 LAB — CBC WITH DIFFERENTIAL/PLATELET
Basophils Absolute: 0 10*3/uL (ref 0.0–0.2)
Basos: 1 %
EOS (ABSOLUTE): 0.4 10*3/uL (ref 0.0–0.4)
Eos: 6 %
Hematocrit: 42.7 % (ref 34.0–46.6)
Hemoglobin: 14.7 g/dL (ref 11.1–15.9)
Immature Grans (Abs): 0 10*3/uL (ref 0.0–0.1)
Immature Granulocytes: 1 %
Lymphocytes Absolute: 2 10*3/uL (ref 0.7–3.1)
Lymphs: 32 %
MCH: 31.9 pg (ref 26.6–33.0)
MCHC: 34.4 g/dL (ref 31.5–35.7)
MCV: 93 fL (ref 79–97)
Monocytes Absolute: 0.6 10*3/uL (ref 0.1–0.9)
Monocytes: 9 %
Neutrophils Absolute: 3.4 10*3/uL (ref 1.4–7.0)
Neutrophils: 51 %
Platelets: 182 10*3/uL (ref 150–450)
RBC: 4.61 x10E6/uL (ref 3.77–5.28)
RDW: 12.9 % (ref 11.7–15.4)
WBC: 6.5 10*3/uL (ref 3.4–10.8)

## 2021-07-27 LAB — CMP14+EGFR
ALT: 19 IU/L (ref 0–32)
AST: 21 IU/L (ref 0–40)
Albumin/Globulin Ratio: 2.3 — ABNORMAL HIGH (ref 1.2–2.2)
Albumin: 4.5 g/dL (ref 3.7–4.7)
Alkaline Phosphatase: 58 IU/L (ref 44–121)
BUN/Creatinine Ratio: 22 (ref 12–28)
BUN: 23 mg/dL (ref 8–27)
Bilirubin Total: 0.6 mg/dL (ref 0.0–1.2)
CO2: 24 mmol/L (ref 20–29)
Calcium: 9.5 mg/dL (ref 8.7–10.3)
Chloride: 104 mmol/L (ref 96–106)
Creatinine, Ser: 1.06 mg/dL — ABNORMAL HIGH (ref 0.57–1.00)
Globulin, Total: 2 g/dL (ref 1.5–4.5)
Glucose: 81 mg/dL (ref 70–99)
Potassium: 5.2 mmol/L (ref 3.5–5.2)
Sodium: 142 mmol/L (ref 134–144)
Total Protein: 6.5 g/dL (ref 6.0–8.5)
eGFR: 55 mL/min/{1.73_m2} — ABNORMAL LOW (ref >59)

## 2021-07-27 LAB — LIPID PANEL
Chol/HDL Ratio: 5.5 ratio — ABNORMAL HIGH (ref 0.0–4.4)
Cholesterol, Total: 205 mg/dL — ABNORMAL HIGH (ref 100–199)
HDL: 37 mg/dL — ABNORMAL LOW (ref >39)
LDL Chol Calc (NIH): 127 mg/dL — ABNORMAL HIGH (ref 0–99)
Triglycerides: 231 mg/dL — ABNORMAL HIGH (ref 0–149)
VLDL Cholesterol Cal: 41 mg/dL — ABNORMAL HIGH (ref 5–40)

## 2021-07-31 ENCOUNTER — Other Ambulatory Visit: Payer: Self-pay | Admitting: Family Medicine

## 2021-07-31 MED ORDER — NEXLIZET 180-10 MG PO TABS: 1.0000 | ORAL_TABLET | Freq: Every day | ORAL | 2 refills | Status: DC

## 2021-08-02 ENCOUNTER — Telehealth: Payer: Self-pay | Admitting: *Deleted

## 2021-08-02 NOTE — Telephone Encounter (Signed)
Why did we deny your request? We denied this request under Medicare Part D because: The information received from your prescriber does not support approval because the use of this drug is not a diagnosis covered under your Medicare Part D benefit. Your plan's formulary has a prior authorization on this drug that has been approved by the [employer] for Medicare and Medicaid Services (CMS). For coverage of this drug under your Medicare Part D benefit, the diagnosis given for this medication must be approved by the Food and Drug Administration (FDA). The FDA-approved uses for this drug are treatment of atherosclerotic cardiovascular disease (ASCVD) and heterozygous familial hypercholesterolemia (HeFH). In addition, you must meet one of the following requirements: (1) you have tried and failed or have a contraindication to a maximally tolerated statin medication (such as atorvastatin), (2) you are determined to be statin intolerant defined by experiencing statin related rhabdomyolysis, OR (3) you have experienced skeletal-related muscle symptoms while receiving separate trials of two statins and during both trials the skeletal-related symptoms resolved during discontinuation. The information received does not show that you are using the requested drug for a Medicare Part D use. We hope this letter helps you understand why we denied your request ?

## 2021-08-02 NOTE — Telephone Encounter (Signed)
Key: [id number] - PA Case ID: [id number] - Rx #: [id number] ? ?Drug ?Nexlizet 180-'10MG'$  tablets ?Form ?[employer] Medicare Electronic [place] Form [phone] NCPDP) ?Original Claim Info ?75,569 ? ?SENT TO PLAN ?

## 2021-08-03 NOTE — Telephone Encounter (Signed)
Patient aware.

## 2021-08-03 NOTE — Telephone Encounter (Signed)
Unfortunately, the medication was denied because you do not have heart disease or a genetic(familial) syndrome of high cholesterol. ?

## 2021-09-08 ENCOUNTER — Other Ambulatory Visit: Payer: Self-pay | Admitting: Family Medicine

## 2021-09-23 ENCOUNTER — Encounter: Payer: Self-pay | Admitting: Family Medicine

## 2021-10-05 DIAGNOSIS — Z961 Presence of intraocular lens: Secondary | ICD-10-CM | POA: Diagnosis not present

## 2021-10-05 DIAGNOSIS — H18512 Endothelial corneal dystrophy, left eye: Secondary | ICD-10-CM | POA: Diagnosis not present

## 2021-10-05 DIAGNOSIS — H2512 Age-related nuclear cataract, left eye: Secondary | ICD-10-CM | POA: Diagnosis not present

## 2021-10-05 DIAGNOSIS — Z947 Corneal transplant status: Secondary | ICD-10-CM | POA: Diagnosis not present

## 2021-10-27 ENCOUNTER — Encounter: Payer: Self-pay | Admitting: Family Medicine

## 2021-10-27 ENCOUNTER — Ambulatory Visit (INDEPENDENT_AMBULATORY_CARE_PROVIDER_SITE_OTHER): Payer: Medicare Other | Admitting: Family Medicine

## 2021-10-27 DIAGNOSIS — G609 Hereditary and idiopathic neuropathy, unspecified: Secondary | ICD-10-CM | POA: Diagnosis not present

## 2021-10-27 MED ORDER — TAPENTADOL HCL 100 MG PO TABS: 100.0000 mg | ORAL_TABLET | Freq: Four times a day (QID) | ORAL | 0 refills | Status: DC

## 2021-10-27 NOTE — Progress Notes (Signed)
Subjective:  Patient ID: Brittany Mercer, female    DOB: 1945-08-15  Age: 76 y.o. MRN: [id number]  CC: Medical Management of Chronic Issues   HPI Brittany Mercer presents for peripheral neuropathy treated with opiate. PDMP review shows appropriate utilization of the patient's controlled medications.      10/27/2021   10:11 AM 07/25/2021    9:12 AM 07/25/2021    8:59 AM  Depression screen PHQ 2/9  Decreased Interest 0 0 0  Down, Depressed, Hopeless 0 0 0  PHQ - 2 Score 0 0 0  Altered sleeping  1   Tired, decreased energy  0   Change in appetite  0   Feeling bad or failure about yourself   0   Trouble concentrating  0   Moving slowly or fidgety/restless  0   Suicidal thoughts  0   PHQ-9 Score  1   Difficult doing work/chores  Somewhat difficult     History Brittany Mercer has a past medical history of Cataract, Chronic narcotic use, Headache, Hereditary Brittany idiopathic peripheral neuropathy, Hyperlipidemia, Hypertension, Hypokalemia, OA (osteoarthritis), Peripheral neuropathy, Brittany Wears glasses.   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   Her family history includes Arthritis in her brother; Dementia in her father; Heart disease in her mother; Hyperlipidemia in her mother; Hypertension in her brother, father, Brittany mother; Neuropathy in her father; Stroke in her mother.She reports that she quit smoking about 23 years ago. Her smoking use included cigarettes. She has a 15.00 pack-year smoking history. She has never used smokeless tobacco. She reports current alcohol use of  about 4.0 standard drinks of alcohol per week. She reports that she does not use drugs.    ROS Review of Systems  Constitutional: Negative.   HENT: Negative.    Eyes:  Negative for visual disturbance.  Respiratory:  Negative for shortness of breath.   Cardiovascular:  Negative for chest pain.  Gastrointestinal:  Negative for abdominal pain.  Musculoskeletal:  Negative for arthralgias.    Objective:  BP (!) 158/81   Pulse 68   Temp (!) 97.1 F (36.2 C)   Ht '5\' 9"'$  (1.753 m)   Wt 194 lb 12.8 oz (88.4 kg)   SpO2 98%   BMI 28.77 kg/m   BP Readings from Last 3 Encounters:  10/27/21 (!) 158/81  07/25/21 135/75  04/27/21 (!) 153/71    Wt Readings from Last 3 Encounters:  10/27/21 194 lb 12.8 oz (88.4 kg)  07/25/21 190 lb 3.2 oz (86.3 kg)  07/19/21 187 lb (84.8 kg)     Physical Exam Constitutional:      General: She is not in acute distress.    Appearance: She is well-developed.  Cardiovascular:     Rate Brittany Rhythm: Normal rate Brittany regular rhythm.  Pulmonary:     Breath sounds: Normal breath sounds.  Musculoskeletal:        General: Normal range of motion.  Skin:    General: Skin is warm Brittany dry.  Neurological:     Mental Status: She is alert Brittany oriented to person, place, Brittany time.       Assessment & Plan:   Brittany Mercer was  seen today for medical management of chronic issues.  Diagnoses Brittany all orders for this visit:  Idiopathic peripheral neuropathy -     Tapentadol HCl 100 MG TABS; Take 1 tablet (100 mg total) by mouth 4 (four) times daily. -     Tapentadol HCl 100 MG TABS; Take 1 tablet (100 mg total) by mouth 4 (four) times daily. -     Tapentadol HCl 100 MG TABS; Take 1 tablet (100 mg total) by mouth 4 (four) times daily.       I have discontinued Brittany Mercer. Brittany Mercer, Brittany Mercer, Brittany Mercer. I am also having her maintain her prednisoLONE acetate, fenofibrate, potassium chloride, estradiol, Nexlizet, metoprolol succinate,  Tapentadol HCl, Tapentadol HCl, Brittany Tapentadol HCl.  Allergies as of 10/27/2021       Reactions   Statins Other (See Comments)   Myalgia, turns red   Penicillins Rash, Other (See Comments)   Pt not sure if she is allergic to penicillin or sulfa. So she does not take either one.  DID THE REACTION INVOLVE: Swelling of the face/tongue/throat, SOB, or low BP? Unknown Sudden or severe rash/hives, skin peeling, or the inside of the mouth or nose? No Did it require medical treatment? No When did it last happen?      childhood allergy If all above answers are "NO", may proceed with cephalosporin use.   Sulfa Antibiotics Rash, Other (See Comments)   Pt not sure if she is allergic to sulfa or penicillin so she doesn't take either one.         Medication List        Accurate as of October 27, 2021 10:46 AM. If you have any questions, ask your nurse or doctor.          STOP taking these medications    Brittany Mercer 200 MG tablet Commonly known as: ADVIL Stopped by: Claretta Fraise, MD   Mercer 10 MG tablet Commonly known as: CLARITIN Stopped by: Claretta Fraise, MD   Mercer 25 MG tablet Commonly known as: ANTIVERT Stopped by: Claretta Fraise, MD       TAKE these medications    estradiol 0.1 MG/GM vaginal cream Commonly known as: ESTRACE Place 1 Applicatorful vaginally once a week. At bed time   fenofibrate 160 MG tablet Take 1 tablet (160 mg total) by mouth daily.   metoprolol succinate 50 MG 24 hr tablet Commonly known as: TOPROL-XL TAKE ONE TABLET BY MOUTH WITH OR IMMEDIATELY FOLLOWING A MEAL ONCE DAILY   Nexlizet 180-10 MG Tabs Generic drug: Bempedoic Acid-Ezetimibe Take 1 tablet by mouth daily.   potassium chloride 10 MEQ tablet Commonly known as: KLOR-CON TAKE 2 TO 3 TABLETS BY MOUTH DAILY AS DIRECTED   prednisoLONE acetate 1 % ophthalmic suspension Commonly known as: PRED FORTE Place 1 drop into the right eye 4 (four) times daily.   Tapentadol HCl 100 MG  Tabs Take 1 tablet (100 mg total) by mouth 4 (four) times daily. What changed: Another medication with the same name was changed. Make sure you understand how Brittany when to take each. Changed by: Claretta Fraise, MD   Tapentadol HCl 100 MG Tabs Take 1 tablet (100 mg total) by mouth 4 (four) times daily. Start taking on: November 26, 2021 What changed: These instructions start on November 26, 2021. If you are unsure what to do until then, ask your doctor or other care provider. Changed by: Claretta Fraise, MD   Tapentadol HCl 100 MG Tabs Take 1 tablet (100  mg total) by mouth 4 (four) times daily. Start taking on: December 26, 2021 What changed: These instructions start on December 26, 2021. If you are unsure what to do until then, ask your doctor or other care provider. Changed by: Claretta Fraise, MD         Follow-up: Return in about 3 months (around 01/27/2022).  Claretta Fraise, M.D.

## 2021-11-14 DIAGNOSIS — Z85828 Personal history of other malignant neoplasm of skin: Secondary | ICD-10-CM | POA: Diagnosis not present

## 2021-11-14 DIAGNOSIS — D239 Other benign neoplasm of skin, unspecified: Secondary | ICD-10-CM | POA: Diagnosis not present

## 2021-11-14 DIAGNOSIS — L570 Actinic keratosis: Secondary | ICD-10-CM | POA: Diagnosis not present

## 2022-01-05 ENCOUNTER — Other Ambulatory Visit: Payer: Self-pay | Admitting: Family Medicine

## 2022-01-25 ENCOUNTER — Ambulatory Visit (INDEPENDENT_AMBULATORY_CARE_PROVIDER_SITE_OTHER): Payer: Medicare Other | Admitting: Family Medicine

## 2022-01-25 ENCOUNTER — Encounter: Payer: Self-pay | Admitting: Family Medicine

## 2022-01-25 VITALS — BP 157/73 | HR 73 | Temp 97.4°F | Ht 69.0 in | Wt 189.8 lb

## 2022-01-25 DIAGNOSIS — Z23 Encounter for immunization: Secondary | ICD-10-CM | POA: Diagnosis not present

## 2022-01-25 DIAGNOSIS — G609 Hereditary and idiopathic neuropathy, unspecified: Secondary | ICD-10-CM

## 2022-01-25 DIAGNOSIS — M62830 Muscle spasm of back: Secondary | ICD-10-CM | POA: Diagnosis not present

## 2022-01-25 DIAGNOSIS — E782 Mixed hyperlipidemia: Secondary | ICD-10-CM

## 2022-01-25 DIAGNOSIS — I10 Essential (primary) hypertension: Secondary | ICD-10-CM | POA: Diagnosis not present

## 2022-01-25 MED ORDER — NEXLIZET 180-10 MG PO TABS: 1.0000 | ORAL_TABLET | Freq: Every day | ORAL | 3 refills | Status: DC

## 2022-01-25 MED ORDER — TAPENTADOL HCL 100 MG PO TABS: 100.0000 mg | ORAL_TABLET | Freq: Four times a day (QID) | ORAL | 0 refills | Status: DC

## 2022-01-25 MED ORDER — METOPROLOL SUCCINATE ER 50 MG PO TB24: ORAL_TABLET | ORAL | 2 refills | Status: DC

## 2022-01-25 MED ORDER — POTASSIUM CHLORIDE ER 10 MEQ PO TBCR: EXTENDED_RELEASE_TABLET | ORAL | 3 refills | Status: DC

## 2022-01-25 MED ORDER — BETAMETHASONE SOD PHOS & ACET 6 (3-3) MG/ML IJ SUSP
6.0000 mg | Freq: Once | INTRAMUSCULAR | Status: AC
  Administered 2022-01-25: 6 mg via INTRAMUSCULAR

## 2022-01-25 MED ORDER — ESTRADIOL 0.1 MG/GM VA CREA: 1.0000 | TOPICAL_CREAM | VAGINAL | 12 refills | Status: DC

## 2022-01-25 MED ORDER — FENOFIBRATE 160 MG PO TABS
160.0000 mg | ORAL_TABLET | Freq: Every day | ORAL | 3 refills | Status: DC
  Filled 2023-01-20 (×2): qty 90, 90d supply, fill #0

## 2022-01-25 NOTE — Progress Notes (Signed)
Subjective:  Patient ID: Brittany Mercer, female    DOB: 01/09/1946  Age: 76 y.o. MRN: [id number]  CC: Medical Management of Chronic Issues   HPI Karagan Lehr presents for recheck of chronic neuropathic pain in the legs. Recently had a trip and forgot her meds. Did without for a day. Had withdrawal sx that were miserable. Had diarrhea as well. Pain was increased significantly. Sx resolved after she came home the next day and resumed the med. PDMP review shows appropriate utilization of the patient's controlled medications.  Lfet lower back pain. Onset 4 days ago. Twisting feeling started when she sat down on her bed. No radiation. With bending is intense. Better when sitting or laying still    in for follow-up of elevated cholesterol. Doing well without complaints on current medication. Denies side effects of statin including myalgia and arthralgia and nausea. Currently no chest pain, shortness of breath or other cardiovascular related symptoms noted.   presents for  follow-up of hypertension. Patient has no history of headache chest pain or shortness of breath or recent cough. Patient also denies symptoms of TIA such as focal numbness or weakness. Patient denies side effects from medication. States taking it regularly.       01/25/2022   12:51 PM 10/27/2021   10:11 AM 07/25/2021    9:12 AM  Depression screen PHQ 2/9  Decreased Interest 0 0 0  Down, Depressed, Hopeless 0 0 0  PHQ - 2 Score 0 0 0  Altered sleeping   1  Tired, decreased energy   0  Change in appetite   0  Feeling bad or failure about yourself    0  Trouble concentrating   0  Moving slowly or fidgety/restless   0  Suicidal thoughts   0  PHQ-9 Score   1  Difficult doing work/chores   Somewhat difficult    History Laveta has a past medical history of Cataract, Chronic narcotic use, Headache, Hereditary and idiopathic peripheral neuropathy, Hyperlipidemia, Hypertension, Hypokalemia, OA  (osteoarthritis), Peripheral neuropathy, and Wears glasses.   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   Her family history includes Arthritis in her brother; Dementia in her father; Heart disease in her mother; Hyperlipidemia in her mother; Hypertension in her brother, father, and mother; Neuropathy in her father; Stroke in her mother.She reports that she quit smoking about 23 years ago. Her smoking use included cigarettes. She has a 15.00 pack-year smoking history. She has never used smokeless tobacco. She reports current alcohol use of about 4.0 standard drinks of alcohol per week. She reports that she does not use drugs.    ROS Review of Systems  Constitutional: Negative.   HENT: Negative.    Eyes:  Negative for visual disturbance.  Respiratory:  Negative for shortness of breath.   Cardiovascular:  Negative for chest pain.  Gastrointestinal:  Negative for abdominal pain.  Musculoskeletal:  Negative for arthralgias.    Objective:  BP (!) 157/73   Pulse 73   Temp (!) 97.4 F (36.3 C)   Ht _0  (1.753 m)   Wt 189 lb 12.8 oz (86.1 kg)   SpO2 97%   BMI  28.03 kg/m   BP Readings from Last 3 Encounters:  01/25/22 (!) 157/73  10/27/21 (!) 158/81  07/25/21 135/75    Wt Readings from Last 3 Encounters:  01/25/22 189 lb 12.8 oz (86.1 kg)  10/27/21 194 lb 12.8 oz (88.4 kg)  07/25/21 190 lb 3.2 oz (86.3 kg)     Physical Exam Constitutional:      General: She is not in acute distress.    Appearance: She is well-developed.  Cardiovascular:     Rate and Rhythm: Normal rate and  regular rhythm.  Pulmonary:     Breath sounds: Normal breath sounds.  Musculoskeletal:        General: Tenderness (left L5 region laterally) present. Normal range of motion.  Skin:    General: Skin is warm and dry.  Neurological:     Mental Status: She is alert and oriented to person, place, and time.       Assessment & Plan:   [place] was seen today for medical management of chronic issues.  Diagnoses and all orders for this visit:  Idiopathic peripheral neuropathy -     Tapentadol HCl 100 MG TABS; Take 1 tablet (100 mg total) by mouth 4 (four) times daily. -     Tapentadol HCl 100 MG TABS; Take 1 tablet (100 mg total) by mouth 4 (four) times daily. -     Tapentadol HCl 100 MG TABS; Take 1 tablet (100 mg total) by mouth 4 (four) times daily.  Mixed hyperlipidemia -     Lipid panel  Essential hypertension, benign -     CBC with Differential/Platelet -     CMP14+EGFR  Back muscle spasm -     betamethasone acetate-betamethasone sodium phosphate (CELESTONE) injection 6 mg  Need for immunization against influenza -     Flu Vaccine QUAD High Dose(Fluad)  Other orders -     fenofibrate 160 MG tablet; Take 1 tablet (160 mg total) by mouth daily. -     Bempedoic Acid-Ezetimibe (NEXLIZET) 180-10 MG TABS; Take 1 tablet by mouth daily. -     estradiol (ESTRACE) 0.1 MG/GM vaginal cream; Place 1 Applicatorful vaginally once a week. At bed time -     metoprolol succinate (TOPROL-XL) 50 MG 24 hr tablet; Take with or immediately following a meal. -     potassium chloride (KLOR-CON) 10 MEQ tablet; TAKE 2 TO 3 TABLETS BY MOUTH DAILY AS DIRECTED       I have changed Rejeana Brock. Bishop "Trish"'s metoprolol succinate. I am also having her maintain her prednisoLONE acetate, fenofibrate, Nexlizet, estradiol, potassium chloride, Tapentadol HCl, Tapentadol HCl, and Tapentadol HCl. We administered betamethasone acetate-betamethasone sodium phosphate.  Allergies as of 01/25/2022        Reactions   Statins Other (See Comments)   Myalgia, turns red   Penicillins Rash, Other (See Comments)   Pt not sure if she is allergic to penicillin or sulfa. So she does not take either one.  DID THE REACTION INVOLVE: Swelling of the face/tongue/throat, SOB, or low BP? Unknown Sudden or severe rash/hives, skin peeling, or the inside of the mouth or nose? No Did it require medical treatment? No When did it last happen?      childhood allergy If all above answers are "NO", may proceed with cephalosporin use.   Sulfa Antibiotics Rash, Other (See Comments)   Pt not sure if she is allergic to sulfa or penicillin so she doesn't take either one.  Medication List        Accurate as of January 25, 2022  9:22 PM. If you have any questions, ask your nurse or doctor.          estradiol 0.1 MG/GM vaginal cream Commonly known as: ESTRACE Place 1 Applicatorful vaginally once a week. At bed time   fenofibrate 160 MG tablet Take 1 tablet (160 mg total) by mouth daily.   metoprolol succinate 50 MG 24 hr tablet Commonly known as: TOPROL-XL Take with or immediately following a meal. What changed: See the new instructions. Changed by: Claretta Fraise, MD   Nexlizet 180-10 MG Tabs Generic drug: Bempedoic Acid-Ezetimibe Take 1 tablet by mouth daily.   potassium chloride 10 MEQ tablet Commonly known as: KLOR-CON TAKE 2 TO 3 TABLETS BY MOUTH DAILY AS DIRECTED   prednisoLONE acetate 1 % ophthalmic suspension Commonly known as: PRED FORTE Place 1 drop into the right eye 4 (four) times daily.   Tapentadol HCl 100 MG Tabs Take 1 tablet (100 mg total) by mouth 4 (four) times daily. What changed: Another medication with the same name was changed. Make sure you understand how and when to take each. Changed by: Claretta Fraise, MD   Tapentadol HCl 100 MG Tabs Take 1 tablet (100 mg total) by mouth 4 (four) times daily. Start taking on: February 24, 2022 What changed: These instructions  start on February 24, 2022. If you are unsure what to do until then, ask your doctor or other care provider. Changed by: Claretta Fraise, MD   Tapentadol HCl 100 MG Tabs Take 1 tablet (100 mg total) by mouth 4 (four) times daily. Start taking on: March 25, 2022 What changed: These instructions start on March 25, 2022. If you are unsure what to do until then, ask your doctor or other care provider. Changed by: Claretta Fraise, MD         Follow-up: Return in about 3 months (around 04/27/2022).  Claretta Fraise, M.D.

## 2022-01-25 NOTE — Patient Instructions (Signed)

## 2022-02-02 ENCOUNTER — Encounter: Payer: Self-pay | Admitting: Family Medicine

## 2022-02-02 NOTE — Telephone Encounter (Signed)
I am disappointed to hear the shot didn't help more. Please set up a time to come back to the office for further evaluation.  Claretta Fraise, MD

## 2022-02-13 ENCOUNTER — Telehealth: Payer: Self-pay | Admitting: *Deleted

## 2022-02-13 NOTE — Telephone Encounter (Signed)
(  Key: [id number]) PA Case ID #: [id number] Need Help? Call us at [phone] Status sent iconSent to Plan today Drug Nexlizet 180-'10MG'$  tablets ePA cloud logo Form [employer] [facility] Medicare Electronic PA Form [phone] NCPDP)

## 2022-02-15 NOTE — Telephone Encounter (Signed)
Let her know. I think Almyra Free may be able to help her in January. Thanks

## 2022-02-15 NOTE — Telephone Encounter (Signed)
Nexlizet was denied by patient's insurance  Brittany Mercer Key: [id number] - PA Case ID: [id number] Need help? Call us at [phone] Outcome Deniedon November 21 CaseId:[id number];Status:Denied;Review Type:Prior Auth;Appeal Information: Attention:ATTN: [place] [id number]. [phone] Phone:[phone] Fax:[phone] WebAddress:[email or web address]; Important - Please read the below note on eAppeals: Please reference the denial letter for information on the rights for an appeal, rationale for the denial, and how to submit an appeal including if any information is needed to support the appeal. Note about urgent situations - Generally, an urgent situation is one which, in the opinion of the provider, the health of the patient may be in serious jeopardy or may experience pain that cannot be adequately controlled while waiting for a decision on the appeal.; Drug Nexlizet 180-'10MG'$  tablets Form [employer] Medicare Electronic PA Form (2017 NCPDP)

## 2022-02-15 NOTE — Telephone Encounter (Signed)
Pt aware and voiced understanding 

## 2022-02-21 ENCOUNTER — Ambulatory Visit: Payer: Medicare Other | Admitting: Family Medicine

## 2022-03-03 ENCOUNTER — Emergency Department (HOSPITAL_BASED_OUTPATIENT_CLINIC_OR_DEPARTMENT_OTHER): Payer: Medicare Other

## 2022-03-03 ENCOUNTER — Emergency Department (HOSPITAL_BASED_OUTPATIENT_CLINIC_OR_DEPARTMENT_OTHER)
Admission: EM | Admit: 2022-03-03 | Discharge: 2022-03-03 | Disposition: A | Discharge status: Home / Self Care | Payer: Medicare Other | Attending: Emergency Medicine | Admitting: Emergency Medicine

## 2022-03-03 ENCOUNTER — Encounter (HOSPITAL_BASED_OUTPATIENT_CLINIC_OR_DEPARTMENT_OTHER): Payer: Self-pay | Admitting: Emergency Medicine

## 2022-03-03 ENCOUNTER — Other Ambulatory Visit: Payer: Self-pay

## 2022-03-03 DIAGNOSIS — I10 Essential (primary) hypertension: Secondary | ICD-10-CM | POA: Diagnosis not present

## 2022-03-03 DIAGNOSIS — S0993XA Unspecified injury of face, initial encounter: Secondary | ICD-10-CM | POA: Diagnosis not present

## 2022-03-03 DIAGNOSIS — Y92000 Kitchen of unspecified non-institutional (private) residence as  the place of occurrence of the external cause: Secondary | ICD-10-CM | POA: Diagnosis not present

## 2022-03-03 DIAGNOSIS — Z23 Encounter for immunization: Secondary | ICD-10-CM | POA: Diagnosis not present

## 2022-03-03 DIAGNOSIS — N3000 Acute cystitis without hematuria: Secondary | ICD-10-CM | POA: Insufficient documentation

## 2022-03-03 DIAGNOSIS — S0181XA Laceration without foreign body of other part of head, initial encounter: Secondary | ICD-10-CM | POA: Insufficient documentation

## 2022-03-03 DIAGNOSIS — S12000A Unspecified displaced fracture of first cervical vertebra, initial encounter for closed fracture: Secondary | ICD-10-CM | POA: Diagnosis not present

## 2022-03-03 DIAGNOSIS — W01190A Fall on same level from slipping, tripping and stumbling with subsequent striking against furniture, initial encounter: Secondary | ICD-10-CM | POA: Insufficient documentation

## 2022-03-03 DIAGNOSIS — S0101XA Laceration without foreign body of scalp, initial encounter: Secondary | ICD-10-CM

## 2022-03-03 DIAGNOSIS — W19XXXA Unspecified fall, initial encounter: Secondary | ICD-10-CM

## 2022-03-03 LAB — CBC WITH DIFFERENTIAL/PLATELET
Abs Immature Granulocytes: 0.02 10*3/uL (ref 0.00–0.07)
Basophils Absolute: 0 10*3/uL (ref 0.0–0.1)
Basophils Relative: 0 %
Eosinophils Absolute: 0.2 10*3/uL (ref 0.0–0.5)
Eosinophils Relative: 4 %
HCT: 39.6 % (ref 36.0–46.0)
Hemoglobin: 13.9 g/dL (ref 12.0–15.0)
Immature Granulocytes: 0 %
Lymphocytes Relative: 26 %
Lymphs Abs: 1.3 10*3/uL (ref 0.7–4.0)
MCH: 31.6 pg (ref 26.0–34.0)
MCHC: 35.1 g/dL (ref 30.0–36.0)
MCV: 90 fL (ref 80.0–100.0)
Monocytes Absolute: 0.5 10*3/uL (ref 0.1–1.0)
Monocytes Relative: 11 %
Neutro Abs: 2.9 10*3/uL (ref 1.7–7.7)
Neutrophils Relative %: 59 %
Platelets: 135 10*3/uL — ABNORMAL LOW (ref 150–400)
RBC: 4.4 MIL/uL (ref 3.87–5.11)
RDW: 12.3 % (ref 11.5–15.5)
WBC: 5 10*3/uL (ref 4.0–10.5)
nRBC: 0 % (ref 0.0–0.2)

## 2022-03-03 LAB — BASIC METABOLIC PANEL
Anion gap: 10 (ref 5–15)
BUN: 26 mg/dL — ABNORMAL HIGH (ref 8–23)
CO2: 28 mmol/L (ref 22–32)
Calcium: 9.3 mg/dL (ref 8.9–10.3)
Chloride: 104 mmol/L (ref 98–111)
Creatinine, Ser: 0.98 mg/dL (ref 0.44–1.00)
GFR, Estimated: >60 mL/min (ref >60)
Glucose, Bld: 94 mg/dL (ref 70–99)
Potassium: 3.7 mmol/L (ref 3.5–5.1)
Sodium: 142 mmol/L (ref 135–145)

## 2022-03-03 LAB — URINALYSIS, ROUTINE W REFLEX MICROSCOPIC
Bilirubin Urine: NEGATIVE
Glucose, UA: NEGATIVE mg/dL
Hgb urine dipstick: NEGATIVE
Ketones, ur: NEGATIVE mg/dL
Nitrite: POSITIVE — AB
Protein, ur: NEGATIVE mg/dL
Specific Gravity, Urine: 1.009 (ref 1.005–1.030)
pH: 6 (ref 5.0–8.0)

## 2022-03-03 MED ORDER — LIDOCAINE-EPINEPHRINE (PF) 2 %-1:200000 IJ SOLN
20.0000 mL | Freq: Once | INTRAMUSCULAR | Status: AC
  Administered 2022-03-03: 20 mL
  Filled 2022-03-03: qty 20

## 2022-03-03 MED ORDER — ACETAMINOPHEN 325 MG PO TABS
650.0000 mg | ORAL_TABLET | Freq: Once | ORAL | Status: AC
  Administered 2022-03-03: 650 mg via ORAL
  Filled 2022-03-03: qty 2

## 2022-03-03 MED ORDER — CEPHALEXIN 500 MG PO CAPS: 500.0000 mg | ORAL_CAPSULE | Freq: Four times a day (QID) | ORAL | 0 refills | Status: AC

## 2022-03-03 MED ORDER — LIDOCAINE-EPINEPHRINE-TETRACAINE (LET) TOPICAL GEL
3.0000 mL | Freq: Once | TOPICAL | Status: AC
  Administered 2022-03-03: 3 mL via TOPICAL
  Filled 2022-03-03: qty 3

## 2022-03-03 MED ORDER — TETANUS-DIPHTH-ACELL PERTUSSIS 5-2.5-18.5 LF-MCG/0.5 IM SUSY
0.5000 mL | PREFILLED_SYRINGE | Freq: Once | INTRAMUSCULAR | Status: AC
  Administered 2022-03-03: 0.5 mL via INTRAMUSCULAR
  Filled 2022-03-03: qty 0.5

## 2022-03-03 NOTE — ED Triage Notes (Signed)
Pt here from home with c/o fall last night , lac above the left eye , bleeding controlled, no blood thinners , no loc

## 2022-03-03 NOTE — ED Notes (Signed)
LET placed, provider informed.

## 2022-03-03 NOTE — ED Notes (Signed)
Bandaging applied.

## 2022-03-03 NOTE — ED Notes (Signed)
Pt aware of the need for urine. Pt unable to currently.

## 2022-03-03 NOTE — ED Provider Notes (Signed)
[facility] EMERGENCY DEPT Provider Note   CSN: [id number] Arrival date & time: 03/03/22  6222     History  Chief Complaint  Patient presents with   Brittany Mercer is a 76 y.o. female. With past medical history of hypertension, hyperlipidemia, migraines, BPPV who presents to the emergency department with fall.  States last night around 1230AM she got up from bed to grab water. She states that she went to the kitchen and then remembers impacting the corner of a cabinet with her forehead.. States that she does not remember if she tripped or slipped. She denies feeling short of breath or chest pain or dizziness prior to falling. She denies having any of these symptoms since the event. States she spent the night trying to stop the bleeding but eventually called a friend to bring her to the emergency department. Denies other injuries except for laceration. Denies neck pain, chest or abdominal pain, pain to extremities. Not anticoagulated. Unknown last tetanus. No recent illnesses.     Fall Associated symptoms include headaches.       Home Medications Prior to Admission medications   Medication Sig Start Date End Date Taking? Authorizing Provider  cephALEXin (KEFLEX) 500 MG capsule Take 1 capsule (500 mg total) by mouth 4 (four) times daily for 5 days. 03/03/22 03/08/22 Yes Mickie Hillier, PA-C  Bempedoic Acid-Ezetimibe (NEXLIZET) 180-10 MG TABS Take 1 tablet by mouth daily. 01/25/22   Claretta Fraise, MD  estradiol (ESTRACE) 0.1 MG/GM vaginal cream Place 1 Applicatorful vaginally once a week. At bed time 01/25/22   Claretta Fraise, MD  fenofibrate 160 MG tablet Take 1 tablet (160 mg total) by mouth daily. 01/25/22   Claretta Fraise, MD  metoprolol succinate (TOPROL-XL) 50 MG 24 hr tablet Take with or immediately following a meal. 01/25/22   Claretta Fraise, MD  potassium chloride (KLOR-CON) 10 MEQ tablet TAKE 2 TO 3 TABLETS BY MOUTH DAILY AS DIRECTED 01/25/22    Claretta Fraise, MD  prednisoLONE acetate (PRED FORTE) 1 % ophthalmic suspension Place 1 drop into the right eye 4 (four) times daily. 05/30/19   [provider]  Tapentadol HCl 100 MG TABS Take 1 tablet (100 mg total) by mouth 4 (four) times daily. 03/25/22 04/24/22  Claretta Fraise, MD  Tapentadol HCl 100 MG TABS Take 1 tablet (100 mg total) by mouth 4 (four) times daily. 02/24/22 03/26/22  Claretta Fraise, MD  Tapentadol HCl 100 MG TABS Take 1 tablet (100 mg total) by mouth 4 (four) times daily. 01/25/22 02/24/22  Claretta Fraise, MD      Allergies    Statins, Penicillins, and Sulfa antibiotics    Review of Systems   Review of Systems  Skin:  Positive for wound.  Neurological:  Positive for headaches.  All other systems reviewed and are negative.   Physical Exam Updated Vital Signs BP (!) 158/74 (BP Location: Right Arm)   Pulse 67   Temp 99.9 F (37.7 C) (Oral)   Resp 16   SpO2 99%  Physical Exam Vitals and nursing note reviewed.  Constitutional:      General: She is not in acute distress.    Appearance: Normal appearance. She is not ill-appearing.  HENT:     Head: Normocephalic. Contusion and laceration present.      Mouth/Throat:     Mouth: Mucous membranes are dry.     Pharynx: Oropharynx is clear.  Eyes:     General: No scleral icterus.  Extraocular Movements: Extraocular movements intact.     Pupils: Pupils are equal, round, and reactive to light.  Cardiovascular:     Rate and Rhythm: Normal rate and regular rhythm.     Pulses: Normal pulses.  Pulmonary:     Effort: Pulmonary effort is normal.     Breath sounds: Normal breath sounds.  Abdominal:     General: Bowel sounds are normal.     Palpations: Abdomen is soft.  Musculoskeletal:        General: No signs of injury. Normal range of motion.     Cervical back: Normal range of motion and neck supple. No tenderness.  Skin:    General: Skin is warm and dry.     Capillary Refill: Capillary refill takes less  than 2 seconds.     Comments: 4cm stellate shaped laceration to the left forehead just above the left brow ridge, mildly oozing.   Neurological:     General: No focal deficit present.     Mental Status: She is alert and oriented to person, place, and time. Mental status is at baseline.  Psychiatric:        Mood and Affect: Mood normal.        Behavior: Behavior normal.        Thought Content: Thought content normal.        Judgment: Judgment normal.     ED Results / Procedures / Treatments   Labs (all labs ordered are listed, but only abnormal results are displayed) Labs Reviewed  BASIC METABOLIC PANEL - Abnormal; Notable for the following components:      Result Value   BUN 26 (*)    All other components within normal limits  CBC WITH DIFFERENTIAL/PLATELET - Abnormal; Notable for the following components:   Platelets 135 (*)    All other components within normal limits  URINALYSIS, ROUTINE W REFLEX MICROSCOPIC - Abnormal; Notable for the following components:   Nitrite POSITIVE (*)    Leukocytes,Ua TRACE (*)    Bacteria, UA MANY (*)    All other components within normal limits  URINE CULTURE  CBG MONITORING, ED   EKG None  Radiology CT Head Wo Contrast  Result Date: 03/03/2022 CLINICAL DATA:  Blunt trauma facial trauma. EXAM: CT HEAD WITHOUT CONTRAST CT MAXILLOFACIAL WITHOUT CONTRAST CT CERVICAL SPINE WITHOUT CONTRAST TECHNIQUE: Multidetector CT imaging of the head, cervical spine, and maxillofacial structures were performed using the standard protocol without intravenous contrast. Multiplanar CT image reconstructions of the cervical spine and maxillofacial structures were also generated. RADIATION DOSE REDUCTION: This exam was performed according to the departmental dose-optimization program which includes automated exposure control, adjustment of the mA and/or kV according to patient size and/or use of iterative reconstruction technique. COMPARISON:  None Available. FINDINGS:  CT HEAD FINDINGS Brain: Choose one There are periventricular and subcortical white matter hypodensities. Generalized cortical atrophy. Vascular: No hyperdense vessel or unexpected calcification. Skull: Normal. Negative for fracture or focal lesion. Sinuses/Orbits: Paranasal sinuses and mastoid air cells are clear. Orbits are clear. Other: None. CT MAXILLOFACIAL FINDINGS Osseous: No fracture or mandibular dislocation. No destructive process. Orbits: Negative. No traumatic or inflammatory finding. Sinuses: Clear. Soft tissues: Small scalp laceration over the LEFT orbit. CT CERVICAL SPINE FINDINGS Alignment: Normal alignment of the cervical vertebral bodies. Skull base and vertebrae: Normal craniocervical junction. No loss of vertebral body height or disc height. Normal facet articulation. No evidence of fracture. Soft tissues and spinal canal: No prevertebral soft tissue swelling. No perispinal or  epidural hematoma. Disc levels: Endplate spurring and joint space narrowing from C3 to the C7. No acute findings Upper chest: Clear Other: None IMPRESSION: 1. No acute intracranial findings. Shallow scalp laceration over the LEFT frontal bone. 2. No facial bone fracture. 3. Cervical spine fracture. 4. Moderate disc osteophytic disease. Electronically Signed   By: Suzy Bouchard M.D.   On: 03/03/2022 08:29   CT Cervical Spine Wo Contrast  Result Date: 03/03/2022 CLINICAL DATA:  Blunt trauma facial trauma. EXAM: CT HEAD WITHOUT CONTRAST CT MAXILLOFACIAL WITHOUT CONTRAST CT CERVICAL SPINE WITHOUT CONTRAST TECHNIQUE: Multidetector CT imaging of the head, cervical spine, and maxillofacial structures were performed using the standard protocol without intravenous contrast. Multiplanar CT image reconstructions of the cervical spine and maxillofacial structures were also generated. RADIATION DOSE REDUCTION: This exam was performed according to the departmental dose-optimization program which includes automated exposure control,  adjustment of the mA and/or kV according to patient size and/or use of iterative reconstruction technique. COMPARISON:  None Available. FINDINGS: CT HEAD FINDINGS Brain: Choose one There are periventricular and subcortical white matter hypodensities. Generalized cortical atrophy. Vascular: No hyperdense vessel or unexpected calcification. Skull: Normal. Negative for fracture or focal lesion. Sinuses/Orbits: Paranasal sinuses and mastoid air cells are clear. Orbits are clear. Other: None. CT MAXILLOFACIAL FINDINGS Osseous: No fracture or mandibular dislocation. No destructive process. Orbits: Negative. No traumatic or inflammatory finding. Sinuses: Clear. Soft tissues: Small scalp laceration over the LEFT orbit. CT CERVICAL SPINE FINDINGS Alignment: Normal alignment of the cervical vertebral bodies. Skull base and vertebrae: Normal craniocervical junction. No loss of vertebral body height or disc height. Normal facet articulation. No evidence of fracture. Soft tissues and spinal canal: No prevertebral soft tissue swelling. No perispinal or epidural hematoma. Disc levels: Endplate spurring and joint space narrowing from C3 to the C7. No acute findings Upper chest: Clear Other: None IMPRESSION: 1. No acute intracranial findings. Shallow scalp laceration over the LEFT frontal bone. 2. No facial bone fracture. 3. Cervical spine fracture. 4. Moderate disc osteophytic disease. Electronically Signed   By: Suzy Bouchard M.D.   On: 03/03/2022 08:29   CT Maxillofacial WO CM  Result Date: 03/03/2022 CLINICAL DATA:  Blunt trauma facial trauma. EXAM: CT HEAD WITHOUT CONTRAST CT MAXILLOFACIAL WITHOUT CONTRAST CT CERVICAL SPINE WITHOUT CONTRAST TECHNIQUE: Multidetector CT imaging of the head, cervical spine, and maxillofacial structures were performed using the standard protocol without intravenous contrast. Multiplanar CT image reconstructions of the cervical spine and maxillofacial structures were also generated. RADIATION  DOSE REDUCTION: This exam was performed according to the departmental dose-optimization program which includes automated exposure control, adjustment of the mA and/or kV according to patient size and/or use of iterative reconstruction technique. COMPARISON:  None Available. FINDINGS: CT HEAD FINDINGS Brain: Choose one There are periventricular and subcortical white matter hypodensities. Generalized cortical atrophy. Vascular: No hyperdense vessel or unexpected calcification. Skull: Normal. Negative for fracture or focal lesion. Sinuses/Orbits: Paranasal sinuses and mastoid air cells are clear. Orbits are clear. Other: None. CT MAXILLOFACIAL FINDINGS Osseous: No fracture or mandibular dislocation. No destructive process. Orbits: Negative. No traumatic or inflammatory finding. Sinuses: Clear. Soft tissues: Small scalp laceration over the LEFT orbit. CT CERVICAL SPINE FINDINGS Alignment: Normal alignment of the cervical vertebral bodies. Skull base and vertebrae: Normal craniocervical junction. No loss of vertebral body height or disc height. Normal facet articulation. No evidence of fracture. Soft tissues and spinal canal: No prevertebral soft tissue swelling. No perispinal or epidural hematoma. Disc levels: Endplate spurring and joint space  narrowing from C3 to the C7. No acute findings Upper chest: Clear Other: None IMPRESSION: 1. No acute intracranial findings. Shallow scalp laceration over the LEFT frontal bone. 2. No facial bone fracture. 3. Cervical spine fracture. 4. Moderate disc osteophytic disease. Electronically Signed   By: Suzy Bouchard M.D.   On: 03/03/2022 08:29    Procedures .Marland KitchenLaceration Repair  Date/Time: 03/03/2022 9:31 AM  Performed by: Mickie Hillier, PA-C Authorized by: Mickie Hillier, PA-C   Consent:    Consent obtained:  Verbal   Consent given by:  Patient   Risks, benefits, and alternatives were discussed: yes     Risks discussed:  Infection, pain, retained foreign body, poor  cosmetic result, need for additional repair, nerve damage, poor wound healing, vascular damage and tendon damage   Alternatives discussed:  No treatment and delayed treatment Universal protocol:    Procedure explained and questions answered to patient or proxy's satisfaction: yes     Relevant documents present and verified: yes     Test results available: yes     Imaging studies available: yes     Required blood products, implants, devices, and special equipment available: yes     Site/side marked: yes     Immediately prior to procedure, a time out was called: yes     Patient identity confirmed:  Verbally with patient Anesthesia:    Anesthesia method:  Local infiltration and topical application   Topical anesthetic:  LET   Local anesthetic:  Lidocaine 2% WITH epi Laceration details:    Location:  Scalp   Scalp location:  Frontal   Length (cm):  4   Depth (mm):  4 Pre-procedure details:    Preparation:  Patient was prepped and draped in usual sterile fashion and imaging obtained to evaluate for foreign bodies Exploration:    Limited defect created (wound extended): no     Hemostasis achieved with:  LET, epinephrine and direct pressure   Imaging obtained comment:  CT   Imaging outcome: foreign body not noted     Wound exploration: wound explored through full range of motion and entire depth of wound visualized     Wound extent: areolar tissue violated     Wound extent: no underlying fracture     Contaminated: no   Treatment:    Area cleansed with:  Povidone-iodine and saline   Amount of cleaning:  Standard   Irrigation solution:  Sterile saline   Irrigation volume:  500   Irrigation method:  Pressure wash and tap   Undermining:  Minimal Skin repair:    Repair method:  Sutures   Suture size:  5-0   Suture material:  Prolene   Suture technique:  Retention suture and simple interrupted   Number of sutures:  13 Approximation:    Approximation:  Close Repair type:    Repair  type:  Complex Post-procedure details:    Dressing:  Antibiotic ointment and non-adherent dressing   Procedure completion:  Tolerated well, no immediate complications     Medications Ordered in ED Medications  acetaminophen (TYLENOL) tablet 650 mg (650 mg Oral Given 03/03/22 0734)  lidocaine-EPINEPHrine (XYLOCAINE W/EPI) 2 %-1:200000 (PF) injection 20 mL (20 mLs Infiltration Given by Other 03/03/22 0800)  Tdap (BOOSTRIX) injection 0.5 mL (0.5 mLs Intramuscular Given 03/03/22 0735)  lidocaine-EPINEPHrine-tetracaine (LET) topical gel (3 mLs Topical Given 03/03/22 8502)    ED Course/ Medical Decision Making/ A&P  Medical Decision Making Amount and/or Complexity of Data Reviewed Labs: ordered. Radiology: ordered.  Risk OTC drugs. Prescription drug management.  Initial Impression and Ddx 76 year old female who presents to the emergency department with fall. She is overall stable and non-toxic in appearance. 4cm triangle shaped laceration to the left forehead, consistent with hitting the corner of the cabinet. No other obvious wounds. Story is concerning for possibly syncope? She does not remember slipping or falling, just the impact. Because of this will obtain basic labs, EKG, urine.  Patient PMH that increases complexity of ED encounter:  hypertension, BPPV, migraine  Interpretation of Diagnostics I independent reviewed and interpreted the labs as followed: cbc wnl, bmp wnl, ua consistent with UTI.  - I independently visualized the following imaging with scope of interpretation limited to determining acute life threatening conditions related to emergency care: CT head, cspine, maxillofacial, which revealed no acute findings  Patient Reassessment and Ultimate Disposition/Management Patient stable. Her imaging is negative for acute maxillofacial or cervical spine fracture. CT head without bleed. Labs unremarkable.  Urine does have nitrites and leukocytes.  It is  unclear whether UTI contributed to her fall or if she had a syncopal episode. Urine culture sent. This happened last night and has not had report of further episodes or fall. She ambulates here without assistance well.  She does not have any urinary complaints.  She has a complex wound as noted in picture under physical exam. Stellate wound that interrupts the eyebrow line. I closed this as noted in the procedural note. We discussed that she will need close follow-up with plastic surgery due to location of wound and potential for scarring. She verbalizes understanding.  Will treat her with Keflex for her UTI.   The patient has been appropriately medically screened and/or stabilized in the ED. I have low suspicion for any other emergent medical condition which would require further screening, evaluation or treatment in the ED or require inpatient management. At time of discharge the patient is hemodynamically stable and in no acute distress. I have discussed work-up results and diagnosis with patient and answered all questions. Patient is agreeable with discharge plan. We discussed strict return precautions for returning to the emergency department and to return in 7 days to PCP, UC or ED for suture removal they verbalized understanding.    Patient management required discussion with the following services or consulting groups:  None  Complexity of Problems Addressed Acute complicated illness or Injury  Additional Data Reviewed and Analyzed Further history obtained from: Past medical history and medications listed in the EMR, Recent PCP notes, and Care Everywhere  Patient Encounter Risk Assessment Prescriptions and Minor Procedures  Final Clinical Impression(s) / ED Diagnoses Final diagnoses:  Fall, initial encounter  Laceration of scalp, initial encounter  Acute cystitis without hematuria    Rx / DC Orders ED Discharge Orders          Ordered    Ambulatory referral to Plastic Surgery         03/03/22 0836    cephALEXin (KEFLEX) 500 MG capsule  4 times daily        03/03/22 0952              Mickie Hillier, PA-C 03/03/22 8309    Varney Biles, MD 03/04/22 [phone]

## 2022-03-03 NOTE — Discharge Instructions (Signed)
You were seen in the emergency department today after a fall and laceration to your forehead. The imaging of your brain, cervical spine and bones of the head appear normal. Your labs were also reassuring. We sutured your forehead. As discussed, there is a higher potential for scarring with this laceration. We have referred you to [facility] for follow-up within the next 3-5 days. Please call once you are discharged to schedule your appointment. Additionally, please return to the ER, primary care or urgent care in 7 days to have your sutures removed. Please return sooner if the wound appears infected. You may take tylenol or motrin for pain relief and may be sore over the next few days.

## 2022-03-03 NOTE — ED Notes (Signed)
Discharge paperwork given and verbally understood. 

## 2022-03-03 NOTE — ED Provider Notes (Signed)
    Derwood Kaplan, MD 03/05/22 1447

## 2022-03-05 LAB — URINE CULTURE: Culture: >=100000 — AB

## 2022-03-05 LAB — CBG MONITORING, ED: Glucose-Capillary: 84 mg/dL (ref 70–99)

## 2022-03-06 ENCOUNTER — Telehealth (HOSPITAL_BASED_OUTPATIENT_CLINIC_OR_DEPARTMENT_OTHER): Payer: Self-pay

## 2022-03-06 NOTE — Telephone Encounter (Signed)
Post ED Visit - Positive Culture Follow-up  Culture report reviewed by antimicrobial stewardship pharmacist: [place] [facility] '[x]'$  Bertis Ruddy, Pharm.D. '[]'$  Heide Guile, Pharm.D., BCPS AQ-ID '[]'$  Parks Neptune, Pharm.D., BCPS '[]'$  Alycia Rossetti, Pharm.D., BCPS '[]'$  [place], Pharm.D., BCPS, AAHIVP '[]'$  Legrand Como, Pharm.D., BCPS, AAHIVP '[]'$  Salome Arnt, PharmD, BCPS '[]'$  Johnnette Gourd, PharmD, BCPS '[]'$  Hughes Better, PharmD, BCPS '[]'$  Leeroy Cha, PharmD '[]'$  Laqueta Linden, PharmD, BCPS '[]'$  Albertina Parr, PharmD  [place] Team '[]'$  Leodis Sias, PharmD '[]'$  Lindell Spar, PharmD '[]'$  Royetta Asal, PharmD '[]'$  Graylin Shiver, Rph '[]'$  Rema Fendt) Glennon Mac, PharmD '[]'$  Arlyn Dunning, PharmD '[]'$  Netta Cedars, PharmD '[]'$  Dia Sitter, PharmD '[]'$  Leone Haven, PharmD '[]'$  Gretta Arab, PharmD '[]'$  Theodis Shove, PharmD '[]'$  Peggyann Juba, PharmD '[]'$  Reuel Boom, PharmD   Positive urine culture Treated with Cephalexin, organism sensitive to the same and no further patient follow-up is required at this time.  Glennon Hamilton 03/06/2022, 7:40 AM

## 2022-03-10 ENCOUNTER — Ambulatory Visit (INDEPENDENT_AMBULATORY_CARE_PROVIDER_SITE_OTHER): Payer: Medicare Other | Admitting: Family Medicine

## 2022-03-10 ENCOUNTER — Institutional Professional Consult (permissible substitution): Payer: Medicare Other | Admitting: Plastic Surgery

## 2022-03-10 ENCOUNTER — Encounter: Payer: Self-pay | Admitting: Family Medicine

## 2022-03-10 VITALS — BP 148/71 | HR 68 | Temp 97.7°F | Ht 69.0 in | Wt 185.2 lb

## 2022-03-10 DIAGNOSIS — S0181XD Laceration without foreign body of other part of head, subsequent encounter: Secondary | ICD-10-CM

## 2022-03-10 DIAGNOSIS — N3000 Acute cystitis without hematuria: Secondary | ICD-10-CM

## 2022-03-10 NOTE — Patient Instructions (Signed)
Laceration Care, Adult A laceration is a cut that may go through all layers of the skin and into the tissue that is right under the skin. Some lacerations heal on their own. Others need to be closed with stitches (sutures), staples, skin adhesive strips, or skin glue. Proper care of a laceration reduces the risk for infection, helps the laceration heal better, and may prevent scarring. General tips Keep the wound clean and dry. Do not scratch or pick at the wound. Wash your hands with soap and water for at least 20 seconds before and after touching your wound or changing your bandage (dressing). If soap and water are not available, use hand sanitizer. Do not usedisinfectants or antiseptics, such as rubbing alcohol, to clean your wound unless told by your health care provider. If you were given a dressing, you should change it at least once a day, or as told by your health care provider. You should also change it if it becomes wet or dirty. How to care for your laceration If sutures or staples were used: Keep the wound completely dry for the first 24 hours, or as told by your health care provider. After that time, you may shower or bathe. Do not soak your wound in water until after the sutures or staples have been removed. Clean the wound once each day, or as told by your health care provider. To do this: Wash the wound with soap and water. Rinse the wound with water to remove all soap. Pat the wound dry with a clean towel. Do not rub the wound. After cleaning the wound, apply a thin layer of antibiotic ointment, other topical ointments, or a non-adherent dressing as told by your health care provider. This will help prevent infection and keep the dressing from sticking to the wound. Have the sutures or staples removed as told by your health care provider. Do not  remove sutures or staples yourself. If skin adhesive strips were used: Do not get the skin adhesive strips wet. You may shower or bathe,  but keep the wound dry. If the wound gets wet, pat it dry with a clean towel. Do not rub the wound. Skin adhesive strips fall off on their own. If adhesive strip edges start to loosen and curl up, you may trim the loose edges. Do not remove adhesive strips completely unless your health care provider tells you to do that. If skin glue was used: You may shower or bathe, but try to keep the wound dry. Do not soak the wound in water. After showering or bathing, pat the wound dry with a clean towel. Do not rub the wound. Do not do any activities that will make you sweat a lot until the skin glue has fallen off. Do not apply liquid, cream, or ointment medicine to the wound while the skin glue is in place. Doing this may loosen the film before the wound has healed. If a dressing is placed over the wound, do not apply tape directly over the skin glue. Doing this may cause the glue to be pulled off before the wound has healed. Do not pick at the glue. Skin glue usually remains in place for 5-10 days and then falls off the skin. Follow these instructions at home: Medicines Take over-the-counter and prescription medicines only as told by your health care provider. If you were prescribed an antibiotic medicine or ointment, take or apply it as told by your health care provider. Do not stop using it even if  your condition improves. Managing pain and swelling If directed, put ice on the injured area. To do this: Put ice in a plastic bag. Place a towel between your skin and the bag. Leave the ice on for 20 minutes, 2-3 times a day. Remove the ice if your skin turns bright red. This is very important. If you cannot feel pain, heat, or cold, you have a greater risk of damage to the area. Raise (elevate) the injured area above the level of your heart while you are sitting or lying down for the first 24-48 hours after the laceration is repaired. General instructions  Avoid any activity that could cause your wound  to reopen. Check your wound every day for signs of infection. Watch for: More redness, swelling, or pain. Fluid or blood. Warmth. Pus or a bad smell. Keep all follow-up visits. This is important. Contact a health care provider if: You received a tetanus shot and you have swelling, severe pain, redness, or bleeding at the injection site. Your closed wound breaks open. You have any of these signs of infection: More redness, swelling, or pain around your wound. Fluid or blood coming from your wound. Warmth coming from your wound. Pus or a bad smell coming from your wound. A fever. You notice something coming out of the wound, such as wood or glass. Your pain is not controlled with medicine. You notice a change in the color of your skin near your wound. You need to change the dressing often. You develop a new rash. You have numbness around the wound. Get help right away if: You develop severe swelling around the wound. Your pain suddenly increases and is severe. You develop painful lumps near the wound or on skin anywhere else on your body. You have a red streak going away from your wound. The wound is on your hand or foot, and you cannot properly move a finger or toe. The wound is on your hand or foot, and you notice that your fingers or toes look pale or bluish. Summary A laceration is a cut that may go through all layers of the skin and into the tissue that is right under the skin. Some lacerations heal on their own. Others need to be closed with stitches (sutures), staples, skin adhesive strips, or skin glue. Proper care of a laceration reduces the risk of infection, helps the laceration heal better, and may prevent scarring. This information is not intended to replace advice given to you by your health care provider. Make sure you discuss any questions you have with your health care provider. Document Revised: 05/20/2020 Document Reviewed: 05/20/2020 Elsevier Patient Education   [place].

## 2022-03-10 NOTE — Progress Notes (Signed)
Acute Office Visit  Subjective:     Brittany Mercer ID: Brittany Mercer, female    DOB: 1945/12/10, 76 y.o.   MRN: [id number]  Chief Complaint  Brittany Mercer presents with   Suture / Staple Removal    HPI Brittany Mercer is in today for suture removal. Brittany Mercer is here with Brittany Mercer today. Brittany Mercer presented to the ER on 03/03/22 after hitting Brittany Mercer forehead on the corner of a cabinet.  Brittany Mercer was unable to stop the laceration from bleeding, so Brittany Mercer went to the ER. Brittany Mercer had 13 sutures placed on a a 4 cm stellate laceration. Brittany Mercer had a unremarkable CT of Brittany Mercer head, cervical spine, and maxillofacial. Brittany Mercer also had an EKG with NSR and unremarkable CBC, BMP, and EKG. Brittany Mercer also had a Tdap given. They did also note an UTI and started Brittany Mercer on keflex.  Brittany Mercer has been keeping the area clean and dry. Brittany Mercer denies drainage, fever, or surrounding erythema. The area is tender and sore. Brittany Mercer denies urinary symptoms as well. Brittany Mercer was scheduled for follow up with plastics but they rescheduled the appointment. Brittany Mercer now as an appointment on 03/28/21. Brittany Mercer does have some decreased ability to lift this eyebrow.    ROS As per HPI.      Objective:    BP (!) 148/71   Pulse 68   Temp 97.7 F (36.5 C) (Temporal)   Ht '5\' 9"'$  (1.753 m)   Wt 185 lb 4 oz (84 kg)   SpO2 99%   BMI 27.36 kg/m    Physical Exam Vitals and nursing note reviewed.  Constitutional:      General: Brittany Mercer is not in acute distress.    Appearance: Brittany Mercer is not ill-appearing, toxic-appearing or diaphoretic.  HENT:     Head:     Comments: 4 cm stellate laceration to left forehead, above eyebrow. Approximated edges. Mild tenderness to palpation. No active bleeding or drainage present. Scabbing present, otherwise wound appears clean. No warmth or surround erythema.  Eyes:     General:        Right eye: No discharge.        Left eye: No discharge.     Extraocular Movements: Extraocular movements intact.  Pulmonary:     Effort: Pulmonary effort is normal.     Breath sounds:  Normal breath sounds.  Skin:    General: Skin is warm and dry.  Neurological:     General: No focal deficit present.     Mental Status: Brittany Mercer is alert and oriented to person, place, and time.  Psychiatric:        Mood and Affect: Mood normal.        Behavior: Behavior normal.        Thought Content: Thought content normal.        Judgment: Judgment normal.    Suture Removal  Date/Time: 03/10/2022 8:55 AM  Performed by: Gwenlyn Perking, FNP Authorized by: Gwenlyn Perking, FNP  Body area: head/neck Location details: forehead Wound Appearance: clean (scabbing present) Sutures Removed: 13 Post-removal: antibiotic ointment applied Brittany Mercer tolerance: Brittany Mercer tolerated the procedure well with no immediate complications     No results found for any visits on 03/10/22.      Assessment & Plan:   [name] was seen today for suture / staple removal.  Diagnoses and all orders for this visit:  Laceration of forehead without complication, subsequent encounter Sutures removed. Laceration with approximated edges, no signs of infection. Discussed home care and return precautions. Keep follow  up appt with plastics.  -     Suture Removal  Acute cystitis without hematuria Treated with keflex. Resolved.   The Brittany Mercer indicates understanding of these issues and agrees with the plan.  Gwenlyn Perking, FNP

## 2022-03-28 ENCOUNTER — Encounter: Payer: Self-pay | Admitting: Plastic Surgery

## 2022-03-28 ENCOUNTER — Ambulatory Visit (INDEPENDENT_AMBULATORY_CARE_PROVIDER_SITE_OTHER): Payer: Medicare Other | Admitting: Plastic Surgery

## 2022-03-28 VITALS — BP 143/79 | HR 86 | Ht 69.0 in | Wt 186.0 lb

## 2022-03-28 DIAGNOSIS — S0181XA Laceration without foreign body of other part of head, initial encounter: Secondary | ICD-10-CM | POA: Diagnosis not present

## 2022-03-28 DIAGNOSIS — W19XXXA Unspecified fall, initial encounter: Secondary | ICD-10-CM

## 2022-03-28 DIAGNOSIS — Z719 Counseling, unspecified: Secondary | ICD-10-CM

## 2022-03-28 HISTORY — DX: Laceration without foreign body of other part of head, initial encounter: S01.81XA

## 2022-03-28 NOTE — Progress Notes (Signed)
Patient ID: Brittany Mercer, female    DOB: 1945-08-03, 77 y.o.   MRN: [id number]   Chief Complaint  Patient presents with   Skin Problem    The patient is a 77 year old female here for evaluation of her forehead.  On December 8 she fell while at home and sustained a large laceration on her forehead.  She has had falls in the past so she has scars on her face that seem to have healed nicely.  The area is in the healing phase so it is red still has some swelling particularly medially and she is unable to lift her brow.  She denies any other injuries that occurred at the time.  The area is approximately 7 cm total in a stellate fashion.    Review of Systems  Constitutional:  Positive for activity change. Negative for appetite change.  Eyes: Negative.   Respiratory: Negative.  Negative for chest tightness.   Cardiovascular: Negative.   Gastrointestinal: Negative.   Endocrine: Negative.   Genitourinary: Negative.   Musculoskeletal: Negative.     Past Medical History:  Diagnosis Date   Cataract    Chronic narcotic use    takes Nucynta for neuropathic pain   Headache    Hereditary and idiopathic peripheral neuropathy    followed by pcp   Hyperlipidemia    Hypertension    Hypokalemia    OA (osteoarthritis)    LEFT KNEE   Peripheral neuropathy    Wears glasses     Past Surgical History:  Procedure Laterality Date   ABDOMINAL HYSTERECTOMY  1980s   W/ UNILATERAL SALPINGOOPHORECTOMY   CATARACT EXTRACTION Right    CESAREAN SECTION  x2   last one [place] Right 05/22/2019   EYE SURGERY     KNEE ARTHROSCOPY Right x2  last one 2016   KNEE CLOSED REDUCTION Left 04/11/2018   Procedure: CLOSED MANIPULATION KNEE;  Surgeon: Paralee Cancel, MD;  Location: [facility];  Service: Orthopedics;  Laterality: Left;   SHOULDER OPEN ROTATOR CUFF REPAIR Left 1990s   TONSILLECTOMY  age 31   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 At bed time, Disp: 42.5 g, Rfl: 12   fenofibrate 160 MG tablet, Take 1 tablet (160 mg total) by mouth daily., Disp: 90 tablet, Rfl: 3   metoprolol succinate (TOPROL-XL) 50 MG 24 hr tablet, Take with or immediately following a meal., Disp: 90 tablet, Rfl: 2   potassium chloride (KLOR-CON) 10 MEQ tablet, TAKE 2 TO 3 TABLETS BY MOUTH DAILY AS DIRECTED, Disp: 270 tablet, Rfl: 3   prednisoLONE acetate (PRED FORTE) 1 % ophthalmic suspension, Place 1 drop into the right eye 4 (four) times daily., Disp: , Rfl:    Tapentadol HCl 100 MG TABS, Take 1 tablet (100 mg total) by mouth 4 (four) times daily., Disp: 120 tablet, Rfl: 0   Tapentadol HCl 100 MG TABS, Take 1 tablet (100 mg total) by mouth 4 (four) times daily., Disp: 120 tablet, Rfl: 0   Tapentadol HCl 100 MG TABS,  Take 1 tablet (100 mg total) by mouth 4 (four) times daily., Disp: 120 tablet, Rfl: 0   Objective:   Vitals:   03/28/22 1436  BP: (!) 143/79  Pulse: 86    Physical Exam Constitutional:      Appearance: Normal appearance.  HENT:     Head: Normocephalic.  Cardiovascular:     Rate and Rhythm: Normal rate.     Pulses: Normal pulses.  Musculoskeletal:        General: Swelling, tenderness and signs of injury present. No deformity.  Skin:    General: Skin is warm.     Capillary Refill: Capillary refill takes less than 2 seconds.     Coloration: Skin is jaundiced.     Findings: Bruising present. No lesion.  Neurological:     Mental Status: She is alert.  Psychiatric:        Mood and Affect: Mood normal.         Behavior: Behavior normal.        Thought Content: Thought content normal.        Judgment: Judgment normal.     Assessment & Plan:  Laceration of face with complication, initial encounter  Recommend scar cream like skinuva.  Massage and a reevaluation in 6 weeks.  I explained the healing process and disrupting at this point would not be beneficial and could be detrimental to the overall healing.  She is in agreement and we will see her back in 6 weeks.  Pictures were obtained of the patient and placed in the chart with the patient's or guardian's permission.   Pass Christian, DO

## 2022-04-03 ENCOUNTER — Ambulatory Visit (INDEPENDENT_AMBULATORY_CARE_PROVIDER_SITE_OTHER): Payer: Medicare Other

## 2022-04-03 ENCOUNTER — Other Ambulatory Visit: Payer: Self-pay | Admitting: Family Medicine

## 2022-04-03 DIAGNOSIS — Z780 Asymptomatic menopausal state: Secondary | ICD-10-CM | POA: Diagnosis not present

## 2022-04-04 DIAGNOSIS — Z780 Asymptomatic menopausal state: Secondary | ICD-10-CM | POA: Diagnosis not present

## 2022-04-12 NOTE — Telephone Encounter (Signed)
Apt scheduled 04/20/2022 telephone

## 2022-04-20 ENCOUNTER — Other Ambulatory Visit: Payer: Self-pay | Admitting: Family Medicine

## 2022-04-20 ENCOUNTER — Ambulatory Visit (INDEPENDENT_AMBULATORY_CARE_PROVIDER_SITE_OTHER): Payer: Medicare Other | Admitting: Pharmacist

## 2022-04-20 VITALS — BP 125/80 | HR 75

## 2022-04-20 DIAGNOSIS — E782 Mixed hyperlipidemia: Secondary | ICD-10-CM | POA: Diagnosis not present

## 2022-04-20 DIAGNOSIS — G720 Drug-induced myopathy: Secondary | ICD-10-CM

## 2022-04-20 MED ORDER — PRALUENT 150 MG/ML ~~LOC~~ SOAJ: 150.0000 mg | SUBCUTANEOUS | 5 refills | Status: DC

## 2022-04-20 NOTE — Progress Notes (Addendum)
04/20/2022 Name: Brittany Mercer MRN: [id number] DOB: Apr 01, 1945   HPI:  Brittany Mercer is a 77 y.o. female patient referred to lipid clinic by PCP. PMH is significant for:  Past Medical History:  Diagnosis Date   Cataract    Chronic narcotic use    takes Nucynta for neuropathic pain   Headache    Hereditary and idiopathic peripheral neuropathy    followed by pcp   Hyperlipidemia    Hypertension    Hypokalemia    OA (osteoarthritis)    LEFT KNEE   Peripheral neuropathy    Wears glasses      Current Medications: Current Outpatient Medications on File Prior to Visit  Medication Sig Dispense Refill   estradiol (ESTRACE) 0.1 MG/GM vaginal cream Place 1 Applicatorful vaginally once a week. At bed time 42.5 g 12   fenofibrate 160 MG tablet Take 1 tablet (160 mg total) by mouth daily. 90 tablet 3   metoprolol succinate (TOPROL-XL) 50 MG 24 hr tablet Take with or immediately following a meal. 90 tablet 2   potassium chloride (KLOR-CON) 10 MEQ tablet TAKE 2 TO 3 TABLETS BY MOUTH DAILY AS DIRECTED 270 tablet 3   prednisoLONE acetate (PRED FORTE) 1 % ophthalmic suspension Place 1 drop into the right eye 4 (four) times daily.     Tapentadol HCl 100 MG TABS Take 1 tablet (100 mg total) by mouth 4 (four) times daily. 120 tablet 0   Tapentadol HCl 100 MG TABS Take 1 tablet (100 mg total) by mouth 4 (four) times daily. 120 tablet 0   Tapentadol HCl 100 MG TABS Take 1 tablet (100 mg total) by mouth 4 (four) times daily. 120 tablet 0   No current facility-administered medications on file prior to visit.   Intolerances: 3 OR MORE STATINS PLUS EZETIMIBE  MYOPATHY REDNESS  Risk Factors: htn  LDL goal: <100  Diet: recommend healthy plate and mediterranean diet  Exercise: encouraged  Family History: Her family history includes Arthritis in her brother; Dementia in her father; Heart disease in her mother; Hyperlipidemia in her mother; Hypertension in her brother,  father, and mother; Neuropathy in her father; Stroke in her mother.She reports that she quit smoking about 23 years ago. Her smoking use included cigarettes. She has a 15.00 pack-year smoking history. She has never used smokeless tobacco. She reports current alcohol use of about 4.0 standard drinks of alcohol per week. She reports that she does not use drugs.   Labs:  Lipid Panel     Component Value Date/Time   CHOL 203 (H) 04/24/2022 1356   CHOL 141 07/17/2012 0915   TRIG 198 (H) 04/24/2022 1356   TRIG 308 (H) 06/08/2014 1144   TRIG 116 07/17/2012 0915   HDL 43 04/24/2022 1356   HDL 31 (L) 06/08/2014 1144   HDL 37 (L) 07/17/2012 0915   CHOLHDL 4.7 (H) 04/24/2022 1356   LDLCALC 125 (H) 04/24/2022 1356   LDLCALC 100 (H) 10/07/2013 0839   LDLCALC 81 07/17/2012 0915   LABVLDL 35 04/24/2022 1356     Current Outpatient Medications on File Prior to Visit  Medication Sig Dispense Refill   estradiol (ESTRACE) 0.1 MG/GM vaginal cream Place 1 Applicatorful vaginally once a week. At bed time 42.5 g 12   fenofibrate 160 MG tablet Take 1 tablet (160 mg total) by mouth daily. 90 tablet 3   metoprolol succinate (TOPROL-XL) 50 MG 24 hr tablet Take with or immediately following a meal. 90 tablet 2  potassium chloride (KLOR-CON) 10 MEQ tablet TAKE 2 TO 3 TABLETS BY MOUTH DAILY AS DIRECTED 270 tablet 3   prednisoLONE acetate (PRED FORTE) 1 % ophthalmic suspension Place 1 drop into the right eye 4 (four) times daily.     Tapentadol HCl 100 MG TABS Take 1 tablet (100 mg total) by mouth 4 (four) times daily. 120 tablet 0   Tapentadol HCl 100 MG TABS Take 1 tablet (100 mg total) by mouth 4 (four) times daily. 120 tablet 0   Tapentadol HCl 100 MG TABS Take 1 tablet (100 mg total) by mouth 4 (four) times daily. 120 tablet 0   No current facility-administered medications on file prior to visit.     Allergies  Allergen Reactions   Statins Other (See Comments)    Myalgia, turns red   Penicillins Rash  and Other (See Comments)    Pt not sure if she is allergic to penicillin or sulfa. So she does not take either one.  DID THE REACTION INVOLVE: Swelling of the face/tongue/throat, SOB, or low BP? Unknown Sudden or severe rash/hives, skin peeling, or the inside of the mouth or nose? No Did it require medical treatment? No When did it last happen?      childhood allergy If all above answers are "NO", may proceed with cephalosporin use.   Sulfa Antibiotics Rash and Other (See Comments)    Pt not sure if she is allergic to sulfa or penicillin so she doesn't take either one.     Assessment/Plan:    1. Hyperlipidemia - [facility] APPROVED  REPATHA PREFERRED--WILL START REPATHA 140MG SQ Q14 DAYS WILL NEED PRIOR AUTHORIZATION ENCOURAGE DIETARY CHANGES-LOW FAT, MEDITERRANEAN DIET   Regina Eck, PharmD, BCPS, BCACP Clinical Pharmacist, [place]  II  T [phone]

## 2022-04-24 ENCOUNTER — Ambulatory Visit (INDEPENDENT_AMBULATORY_CARE_PROVIDER_SITE_OTHER): Payer: Medicare Other | Admitting: Family Medicine

## 2022-04-24 ENCOUNTER — Encounter: Payer: Self-pay | Admitting: Family Medicine

## 2022-04-24 ENCOUNTER — Telehealth: Payer: Self-pay | Admitting: Family Medicine

## 2022-04-24 VITALS — BP 162/84 | HR 77 | Temp 97.3°F | Ht 69.0 in | Wt 186.8 lb

## 2022-04-24 DIAGNOSIS — I10 Essential (primary) hypertension: Secondary | ICD-10-CM

## 2022-04-24 DIAGNOSIS — Z79899 Other long term (current) drug therapy: Secondary | ICD-10-CM | POA: Diagnosis not present

## 2022-04-24 DIAGNOSIS — G609 Hereditary and idiopathic neuropathy, unspecified: Secondary | ICD-10-CM

## 2022-04-24 DIAGNOSIS — E782 Mixed hyperlipidemia: Secondary | ICD-10-CM | POA: Diagnosis not present

## 2022-04-24 MED ORDER — TAPENTADOL HCL 100 MG PO TABS: 100.0000 mg | ORAL_TABLET | Freq: Four times a day (QID) | ORAL | 0 refills | Status: DC

## 2022-04-24 NOTE — Progress Notes (Signed)
Subjective:  Patient ID: Brittany Mercer, female    DOB: 11-25-1945  Age: 77 y.o. MRN: [id number]  CC: Medical Management of Chronic Issues   HPI Racquel Mercer presents for continued monitoring of her pain management.  She tried to wean herself to 3 Nucynta daily last week and noted that she had significant increase in pain so she went back to 4-day.  Of note is that she has pain over thousand dollars a month for her medicine out of her own pocket.PDMP review shows appropriate utilization of the patient's controlled medications.  Review of the PDMP shows that her unintentional overdose risk score is 50 which is below average.  Her NARx score's are 421 for opiates 204 sedatives and 0 for stimulants.  Each prescription for Nucynta has been filled appropriately almost always exactly 30 days between.  Ms. Ferrebee describes falling due to not being able to feel her feet.  She was walking into her kitchen late at night and could not feel something at her feet and tripped over it apparently.  She fell and lacerated her brow on the left side and had to go to emergency room for several stitches.  Unfortunately the left brow is now sagging and she has an appointment upcoming with a plastic surgeon to have that revised.    Follow-up of hypertension. Patient has no history of headache chest pain or shortness of breath or recent cough. Patient also denies symptoms of TIA such as numbness weakness lateralizing. Patient checks  blood pressure at home and has not had any elevated readings recently. Patient denies side effects from his medication. States taking it regularly. Patient in for follow-up of elevated cholesterol. Doing well without complaints on fenofibrate.  She has been approved for Praluent and is to start those injections soon.       04/24/2022   12:48 PM 03/10/2022    9:04 AM 01/25/2022   12:51 PM  Depression screen PHQ 2/9  Decreased Interest 0 0 0  Down, Depressed, Hopeless  0 0 0  PHQ - 2 Score 0 0 0  Altered sleeping  0   Tired, decreased energy  0   Change in appetite  0   Feeling bad or failure about yourself   0   Trouble concentrating  0   Moving slowly or fidgety/restless  0   Suicidal thoughts  0   PHQ-9 Score  0   Difficult doing work/chores  Not difficult at all     History Brittany Mercer has a past medical history of Cataract, Chronic narcotic use, Headache, Hereditary and idiopathic peripheral neuropathy, Hyperlipidemia, Hypertension, Hypokalemia, OA (osteoarthritis), Peripheral neuropathy, and Wears glasses.   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   Her family history includes Arthritis in her brother; Dementia in her father; Heart disease in her mother; Hyperlipidemia in her mother; Hypertension in her brother, father, and mother; Neuropathy in her father; Stroke in her mother.She reports that she quit smoking about 23 years ago. Her smoking use included cigarettes. She has a 15.00 pack-year smoking history. She has never used smokeless tobacco. She reports current alcohol use of about 4.0 standard drinks of alcohol per week.  She reports that she does not use drugs.    ROS Review of Systems  Constitutional: Negative.   HENT: Negative.    Eyes:  Negative for visual disturbance.  Respiratory:  Negative for shortness of breath.   Cardiovascular:  Negative for chest pain.  Gastrointestinal:  Negative for abdominal pain.  Musculoskeletal:  Negative for arthralgias.    Objective:  BP (!) 162/84   Pulse 77   Temp (!) 97.3 F (36.3 C)   Ht 5'  9" (1.753 m)   Wt 186 lb 12.8 oz (84.7 kg)   SpO2 98%   BMI 27.59 kg/m   BP Readings from Last 3 Encounters:  04/24/22 (!) 162/84  03/28/22 (!) 143/79  03/10/22 (!) 148/71    Wt Readings from Last 3 Encounters:  04/24/22 186 lb 12.8 oz (84.7 kg)  03/28/22 186 lb (84.4 kg)  03/10/22 185 lb 4 oz (84 kg)     Physical Exam Constitutional:      General: She is not in acute distress.    Appearance: She is well-developed.  Cardiovascular:     Rate and Rhythm: Normal rate and regular rhythm.  Pulmonary:     Breath sounds: Normal breath sounds.  Musculoskeletal:        General: Normal range of motion.  Skin:    General: Skin is warm and dry.  Neurological:     Mental Status: She is alert and oriented to person, place, and time.       Assessment & Plan:   Saamiya was seen today for medical management of chronic issues.  Diagnoses and all orders for this visit:  Mixed hyperlipidemia -     Lipid panel  Essential hypertension, benign -     CBC with Differential/Platelet -     CMP14+EGFR  Controlled substance agreement signed -     ToxASSURE Select 13 (MW), Urine  Idiopathic peripheral neuropathy -     Tapentadol HCl 100 MG TABS; Take 1 tablet (100 mg total) by mouth 4 (four) times daily. -     Tapentadol HCl 100 MG TABS; Take 1 tablet (100 mg total) by mouth 4 (four) times daily. -     Tapentadol HCl 100 MG TABS; Take 1 tablet (100 mg total) by mouth 4 (four) times daily.       I am having Dorcus [place]" maintain her prednisoLONE acetate, fenofibrate, estradiol, metoprolol succinate, potassium chloride, Praluent, Tapentadol HCl, Tapentadol HCl, and Tapentadol HCl.  Allergies as of 04/24/2022       Reactions   Keflex [cephalexin]    GI, couldn't eat/drink, felt very sick   Statins Other (See Comments)   Myalgia, turns red   Zetia [ezetimibe] Other (See Comments)   Myopathy/redness   Penicillins Rash, Other (See Comments)   Pt not sure if she  is allergic to penicillin or sulfa. So she does not take either one.  DID THE REACTION INVOLVE: Swelling of the face/tongue/throat, SOB, or low BP? Unknown Sudden or severe rash/hives, skin peeling, or the inside of the mouth or nose? No Did it require medical treatment? No When did it last happen?      childhood allergy If all above answers are "NO", may proceed with cephalosporin use.   Sulfa Antibiotics Rash, Other (See Comments)   Pt not sure if she is allergic to sulfa or penicillin so she doesn't take either one.         Medication List  Accurate as of April 24, 2022  3:01 PM. If you have any questions, ask your nurse or doctor.          estradiol 0.1 MG/GM vaginal cream Commonly known as: ESTRACE Place 1 Applicatorful vaginally once a week. At bed time   fenofibrate 160 MG tablet Take 1 tablet (160 mg total) by mouth daily.   metoprolol succinate 50 MG 24 hr tablet Commonly known as: TOPROL-XL Take with or immediately following a meal.   potassium chloride 10 MEQ tablet Commonly known as: KLOR-CON TAKE 2 TO 3 TABLETS BY MOUTH DAILY AS DIRECTED   Praluent 150 MG/ML Soaj Generic drug: Alirocumab Inject 150 mg into the skin every 14 (fourteen) days.   prednisoLONE acetate 1 % ophthalmic suspension Commonly known as: PRED FORTE Place 1 drop into the right eye 4 (four) times daily.   Tapentadol HCl 100 MG Tabs Take 1 tablet (100 mg total) by mouth 4 (four) times daily. What changed: Another medication with the same name was changed. Make sure you understand how and when to take each. Changed by: Claretta Fraise, MD   Tapentadol HCl 100 MG Tabs Take 1 tablet (100 mg total) by mouth 4 (four) times daily. Start taking on: May 24, 2022 What changed: These instructions start on May 24, 2022. If you are unsure what to do until then, ask your doctor or other care provider. Changed by: Claretta Fraise, MD   Tapentadol HCl 100 MG Tabs Take 1 tablet (100  mg total) by mouth 4 (four) times daily. Start taking on: June 23, 2022 What changed: These instructions start on June 23, 2022. If you are unsure what to do until then, ask your doctor or other care provider. Changed by: Claretta Fraise, MD         Follow-up: Return in about 3 months (around 07/24/2022).  Claretta Fraise, M.D.

## 2022-04-25 LAB — CMP14+EGFR
ALT: 12 IU/L (ref 0–32)
AST: 12 IU/L (ref 0–40)
Albumin/Globulin Ratio: 2.6 — ABNORMAL HIGH (ref 1.2–2.2)
Albumin: 4.7 g/dL (ref 3.8–4.8)
Alkaline Phosphatase: 56 IU/L (ref 44–121)
BUN/Creatinine Ratio: 28 (ref 12–28)
BUN: 31 mg/dL — ABNORMAL HIGH (ref 8–27)
Bilirubin Total: 0.7 mg/dL (ref 0.0–1.2)
CO2: 23 mmol/L (ref 20–29)
Calcium: 10.1 mg/dL (ref 8.7–10.3)
Chloride: 102 mmol/L (ref 96–106)
Creatinine, Ser: 1.1 mg/dL — ABNORMAL HIGH (ref 0.57–1.00)
Globulin, Total: 1.8 g/dL (ref 1.5–4.5)
Glucose: 92 mg/dL (ref 70–99)
Potassium: 4.6 mmol/L (ref 3.5–5.2)
Sodium: 141 mmol/L (ref 134–144)
Total Protein: 6.5 g/dL (ref 6.0–8.5)
eGFR: 52 mL/min/{1.73_m2} — ABNORMAL LOW (ref >59)

## 2022-04-25 LAB — LIPID PANEL
Chol/HDL Ratio: 4.7 ratio — ABNORMAL HIGH (ref 0.0–4.4)
Cholesterol, Total: 203 mg/dL — ABNORMAL HIGH (ref 100–199)
HDL: 43 mg/dL (ref >39)
LDL Chol Calc (NIH): 125 mg/dL — ABNORMAL HIGH (ref 0–99)
Triglycerides: 198 mg/dL — ABNORMAL HIGH (ref 0–149)
VLDL Cholesterol Cal: 35 mg/dL (ref 5–40)

## 2022-04-25 LAB — CBC WITH DIFFERENTIAL/PLATELET
Basophils Absolute: 0 10*3/uL (ref 0.0–0.2)
Basos: 1 %
EOS (ABSOLUTE): 0.3 10*3/uL (ref 0.0–0.4)
Eos: 5 %
Hematocrit: 45 % (ref 34.0–46.6)
Hemoglobin: 15.4 g/dL (ref 11.1–15.9)
Immature Grans (Abs): 0 10*3/uL (ref 0.0–0.1)
Immature Granulocytes: 0 %
Lymphocytes Absolute: 1.6 10*3/uL (ref 0.7–3.1)
Lymphs: 25 %
MCH: 31.6 pg (ref 26.6–33.0)
MCHC: 34.2 g/dL (ref 31.5–35.7)
MCV: 92 fL (ref 79–97)
Monocytes Absolute: 0.5 10*3/uL (ref 0.1–0.9)
Monocytes: 8 %
Neutrophils Absolute: 3.8 10*3/uL (ref 1.4–7.0)
Neutrophils: 61 %
Platelets: 180 10*3/uL (ref 150–450)
RBC: 4.87 x10E6/uL (ref 3.77–5.28)
RDW: 12.8 % (ref 11.7–15.4)
WBC: 6.2 10*3/uL (ref 3.4–10.8)

## 2022-04-26 LAB — TOXASSURE SELECT 13 (MW), URINE

## 2022-04-26 NOTE — Progress Notes (Signed)
Hello Brittany Mercer, ? ?Your lab result is normal and/or stable.Some minor variations that are not significant are commonly marked abnormal, but do not represent any medical problem for you. ? ?Best regards, ?Johnaton Sonneborn, M.D.

## 2022-05-04 ENCOUNTER — Encounter: Payer: Self-pay | Admitting: Pharmacist

## 2022-05-04 ENCOUNTER — Telehealth: Payer: Self-pay | Admitting: Pharmacist

## 2022-05-04 DIAGNOSIS — E782 Mixed hyperlipidemia: Secondary | ICD-10-CM

## 2022-05-04 MED ORDER — REPATHA SURECLICK 140 MG/ML ~~LOC~~ SOAJ: 140.0000 mg | SUBCUTANEOUS | 5 refills | Status: DC

## 2022-05-04 NOTE — Telephone Encounter (Signed)
REPATHA APPROVED ([place] DENIED)

## 2022-05-09 ENCOUNTER — Ambulatory Visit (INDEPENDENT_AMBULATORY_CARE_PROVIDER_SITE_OTHER): Payer: Medicare Other | Admitting: Plastic Surgery

## 2022-05-09 ENCOUNTER — Encounter: Payer: Self-pay | Admitting: Plastic Surgery

## 2022-05-09 VITALS — BP 165/85 | HR 81

## 2022-05-09 DIAGNOSIS — S0181XA Laceration without foreign body of other part of head, initial encounter: Secondary | ICD-10-CM

## 2022-05-09 DIAGNOSIS — Z719 Counseling, unspecified: Secondary | ICD-10-CM

## 2022-05-09 NOTE — Progress Notes (Signed)
   Subjective:    Patient ID: Brittany Mercer, female    DOB: [birth year], 77 y.o.   MRN: [id number]  Patient is a 77 year old female here for further evaluation of her forehead.  In December she fell while she was at home and lacerated her forehead.  She was repaired in the emergency room very nicely.  It is still going through the phases of healing.  It is about a 7 cm stellate scar.  The redness is getting better.  Part that is bothering her the most now is that her forehead and brows are very asymmetric.      Review of Systems  Constitutional: Negative.   Eyes: Negative.   Respiratory: Negative.    Cardiovascular: Negative.   Gastrointestinal: Negative.   Endocrine: Negative.   Genitourinary: Negative.        Objective:   Physical Exam Cardiovascular:     Rate and Rhythm: Normal rate.     Pulses: Normal pulses.  Musculoskeletal:        General: No swelling or deformity.  Skin:    General: Skin is warm.     Capillary Refill: Capillary refill takes less than 2 seconds.  Neurological:     Mental Status: She is alert and oriented to person, place, and time.  Psychiatric:        Mood and Affect: Mood normal.        Behavior: Behavior normal.        Thought Content: Thought content normal.        Judgment: Judgment normal.         Assessment & Plan:     ICD-10-CM   1. Laceration of face with complication, initial encounter  S01.81XA       Pictures were obtained of the patient and placed in the chart with the patient's or guardian's permission. ' Will have to continue to wait and make sure the area is healed before we do a revision.  In the meantime I did 4 units of Botox on the right forehead to see if I can lower the brow so she does not have as much asymmetry.

## 2022-05-09 NOTE — Progress Notes (Signed)
Botulinum Toxin Procedure Note  Procedure: Cosmetic botulinum toxin   Pre-operative Diagnosis: Dynamic rhytides   Post-operative Diagnosis: Same  Complications:  None  Brief history: The patient desires botulinum toxin injection of her forehead. I discussed with the patient this proposed procedure of botulinum toxin injections, which is customized depending on the particular needs of the patient. It is performed on facial rhytids as a temporary correction. The alternatives were discussed with the patient. The risks were addressed including bleeding, scarring, infection, damage to deeper structures, asymmetry, and chronic pain, which may occur infrequently after a procedure. The individual's choice to undergo a surgical procedure is based on the comparison of risks to potential benefits. Other risks include unsatisfactory results, brow ptosis, eyelid ptosis, allergic reaction, temporary paralysis, which should go away with time, bruising, blurring disturbances and delayed healing. Botulinum toxin injections do not arrest the aging process or produce permanent tightening of the eyelid.  Operative intervention maybe necessary to maintain the results of a blepharoplasty or botulinum toxin. The patient understands and wishes to proceed.  Procedure: The area was prepped with alcohol and dried with a clean gauze. Using a clean technique, the botulinum toxin was diluted with 1.25 cc of preservative-free normal saline which was slowly injected with an 18 gauge needle in a tuberculin syringes.  A 32 gauge needles were then used to inject the botulinum toxin. This mixture allow for an aliquot of 4 units per 0.1 cc in each injection site.    The Botox was injected into the right forehead to try and lower the right brow while the healing is still taking place on the left side. No complications were noted. Light pressure was held for 5 minutes. She was instructed explicitly in post-operative care.  Botox LOT:   [id number]

## 2022-05-16 NOTE — Telephone Encounter (Signed)
Praluent denied in separate encounter

## 2022-06-12 ENCOUNTER — Encounter: Payer: Self-pay | Admitting: Plastic Surgery

## 2022-06-12 ENCOUNTER — Ambulatory Visit (INDEPENDENT_AMBULATORY_CARE_PROVIDER_SITE_OTHER): Payer: Self-pay | Admitting: Plastic Surgery

## 2022-06-12 DIAGNOSIS — Z719 Counseling, unspecified: Secondary | ICD-10-CM

## 2022-06-12 NOTE — Progress Notes (Signed)
The patient is doing well and pleased with the results.  She would like to try to get a little more lift on the left if possible.    Botox was injected into the lateral left orbital area and we added to the glabella as well.  10 Units Botox Lot:  [id number]

## 2022-07-20 ENCOUNTER — Encounter: Payer: Self-pay | Admitting: Family Medicine

## 2022-07-21 ENCOUNTER — Telehealth: Payer: Self-pay | Admitting: Family Medicine

## 2022-07-21 ENCOUNTER — Ambulatory Visit (INDEPENDENT_AMBULATORY_CARE_PROVIDER_SITE_OTHER): Payer: Medicare Other

## 2022-07-21 VITALS — Ht 69.0 in | Wt 183.0 lb

## 2022-07-21 DIAGNOSIS — Z0000 Encounter for general adult medical examination without abnormal findings: Secondary | ICD-10-CM

## 2022-07-21 DIAGNOSIS — G609 Hereditary and idiopathic neuropathy, unspecified: Secondary | ICD-10-CM

## 2022-07-21 MED ORDER — TAPENTADOL HCL 100 MG PO TABS: 100.0000 mg | ORAL_TABLET | Freq: Four times a day (QID) | ORAL | 0 refills | Status: DC

## 2022-07-21 NOTE — Telephone Encounter (Signed)
Stacks patient Last OV 04/24/22. Last RF 06/23/22 #120 . Next OV 08/03/22

## 2022-07-21 NOTE — Progress Notes (Signed)
Subjective:   Brittany Mercer is a 77 y.o. female who presents for Medicare Annual (Subsequent) preventive examination. I connected with  Nicola Girt on 07/21/22 by a audio enabled telemedicine application and verified that I am speaking with the correct person using two identifiers.  Patient Location: Home  Provider Location: Home Office  I discussed the limitations of evaluation and management by telemedicine. The patient expressed understanding and agreed to proceed.  Review of Systems     Cardiac Risk Factors include: advanced age (>69men, >63 women);dyslipidemia     Objective:    Today's Vitals   07/21/22 1151  Weight: 183 lb (83 kg)  Height: 5\' 9"  (1.753 m)   Body mass index is 27.02 kg/m.     07/21/2022   11:55 AM 07/19/2021    1:27 PM 03/31/2021    9:37 AM 07/16/2020    4:40 PM 06/09/2019    2:28 PM 06/06/2018    4:06 PM 04/02/2018    5:23 PM  Advanced Directives  Does Patient Have a Medical Advance Directive? Yes Yes Yes Yes Yes Yes Yes  Type of Estate agent of [place];Living will Healthcare Power of [place];Living will Healthcare Power of [place];Living will Healthcare Power of [place];Living will Healthcare Power of [place];Living will Healthcare Power of [place];Living will   Does patient want to make changes to medical advance directive?   No - Patient declined  No - Patient declined No - Patient declined   Copy of Healthcare Power of Attorney in Chart? No - copy requested No - copy requested  No - copy requested No - copy requested No - copy requested     Current Medications (verified) Outpatient Encounter Medications as of 07/21/2022  Medication Sig   estradiol (ESTRACE) 0.1 MG/GM vaginal cream Place 1 Applicatorful vaginally once a week. At bed time   Evolocumab (REPATHA SURECLICK) 140 MG/ML SOAJ Inject 140 mg into the skin every 14 (fourteen) days.   fenofibrate 160 MG tablet Take 1 tablet (160 mg total) by  mouth daily.   metoprolol succinate (TOPROL-XL) 50 MG 24 hr tablet Take with or immediately following a meal.   potassium chloride (KLOR-CON) 10 MEQ tablet TAKE 2 TO 3 TABLETS BY MOUTH DAILY AS DIRECTED   prednisoLONE acetate (PRED FORTE) 1 % ophthalmic suspension Place 1 drop into the right eye 4 (four) times daily.   Tapentadol HCl 100 MG TABS Take 1 tablet (100 mg total) by mouth 4 (four) times daily.   Tapentadol HCl 100 MG TABS Take 1 tablet (100 mg total) by mouth 4 (four) times daily.   Tapentadol HCl 100 MG TABS Take 1 tablet (100 mg total) by mouth 4 (four) times daily.   No facility-administered encounter medications on file as of 07/21/2022.    Allergies (verified) Keflex [cephalexin], Statins, Zetia [ezetimibe], Penicillins, and Sulfa antibiotics   History: Past Medical History:  Diagnosis Date   Cataract    Chronic narcotic use    takes Nucynta for neuropathic pain   Headache    Hereditary and idiopathic peripheral neuropathy    followed by pcp   Hyperlipidemia    Hypertension    Hypokalemia    OA (osteoarthritis)    LEFT KNEE   Peripheral neuropathy    Wears glasses    Past Surgical History:  Procedure Laterality Date   ABDOMINAL HYSTERECTOMY  1980s   W/ UNILATERAL SALPINGOOPHORECTOMY   CATARACT EXTRACTION Right    CESAREAN SECTION  x2   last one 1975  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   EYE SURGERY     KNEE ARTHROSCOPY Right x2  last one 2016   KNEE CLOSED REDUCTION Left 04/11/2018   Procedure: CLOSED MANIPULATION KNEE;  Surgeon: Durene Romans, MD;  Location: [facility];  Service: Orthopedics;  Laterality: Left;   SHOULDER OPEN ROTATOR CUFF REPAIR Left 1990s   TONSILLECTOMY  age 77   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   Procedure: RIGHT TOTAL KNEE ARTHROPLASTY;  Surgeon: Durene Romans, MD;  Location: [facility];  Service: Orthopedics;  Laterality: Right;   TOTAL KNEE ARTHROPLASTY Left 02/05/2018   Procedure: LEFT TOTAL KNEE ARTHROPLASTY;  Surgeon: Durene Romans, MD;  Location: [facility];  Service: Orthopedics;  Laterality: Left;  70 mins   Family History  Problem Relation Age of Onset   Hypertension Mother    Hyperlipidemia Mother    Stroke Mother    Heart disease Mother    Hypertension Father    Dementia Father    Neuropathy Father    Hypertension Brother    Arthritis Brother    Social History   Socioeconomic History   Marital status: Widowed    Spouse name: Not on file   Number of children: 2   Years of education: college   Highest education level: Bachelor's degree (e.g., BA, AB, BS)  Occupational History   Occupation: Retired   Occupation: Scientist, research (physical sciences)  Tobacco Use   Smoking status: Former    Packs/day: 0.50    Years: 30.00    Additional pack years: 0.00    Total pack years: 15.00    Types: Cigarettes    Quit date: 07/18/1998    Years since quitting: 24.0   Smokeless tobacco: Never  Vaping Use   Vaping Use: Never used  Substance and Sexual Activity   Alcohol use: Yes    Alcohol/week: 4.0 standard drinks of alcohol    Types: 4 Glasses of wine per week    Comment: per year   Drug use: Never   Sexual activity: Not Currently    Birth control/protection: Surgical  Other Topics Concern   Not on file  Social History Narrative   Lives alone. Has 2 adult sons and 4 grandchildren.   Social Determinants of Health   Financial Resource Strain: Low Risk  (07/21/2022)   Overall Financial Resource Strain (CARDIA)    Difficulty of Paying Living Expenses: Not hard at all  Food Insecurity: No Food Insecurity (07/21/2022)   Hunger Vital Sign    Worried About Running Out of Food in the Last Year: Never true    Ran Out of Food in the Last Year: Never true  Transportation Needs: No Transportation Needs (07/21/2022)   PRAPARE - Administrator, Civil Service (Medical): No    Lack of Transportation (Non-Medical): No  Physical Activity: Insufficiently Active (07/21/2022)   Exercise Vital Sign    Days of Exercise  per Week: 2 days    Minutes of Exercise per Session: 20 min  Stress: No Stress Concern Present (07/21/2022)   [employer] of Occupational Health - Occupational Stress Questionnaire    Feeling of Stress : Not at all  Social Connections: Moderately Integrated (07/21/2022)   Social Connection and Isolation Panel [NHANES]    Frequency of Communication with Friends and Family: More than three times a week    Frequency of Social Gatherings with Friends and Family: More than three times a week    Attends Religious Services: More than 4 times per year  Active Member of Clubs or Organizations: Yes    Attends Banker Meetings: More than 4 times per year    Marital Status: Widowed    Tobacco Counseling Counseling given: Not Answered   Clinical Intake:  Pre-visit preparation completed: Yes  Pain : No/denies pain     Nutritional Risks: None Diabetes: No  How often do you need to have someone help you when you read instructions, pamphlets, or other written materials from your doctor or pharmacy?: 1 - Never  Diabetic?no   Interpreter Needed?: No  Information entered by :: Renie Ora, LPN   Activities of Daily Living    07/21/2022   11:55 AM  In your present state of health, do you have any difficulty performing the following activities:  Hearing? 0  Vision? 0  Difficulty concentrating or making decisions? 0  Walking or climbing stairs? 0  Dressing or bathing? 0  Doing errands, shopping? 0  Preparing Food and eating ? N  Using the Toilet? N  In the past six months, have you accidently leaked urine? N  Do you have problems with loss of bowel control? N  Managing your Medications? N  Managing your Finances? N  Housekeeping or managing your Housekeeping? N    Patient Care Team: Mechele Claude, MD as PCP - General (Family Medicine) Marcelino Duster, MD as Referring Physician (Dermatology) Holli Humbles, MD as Referring Physician  (Ophthalmology)  Indicate any recent Medical Services you may have received from other than [facility] providers in the past year (date may be approximate).     Assessment:   This is a routine wellness examination for [place].  Hearing/Vision screen Vision Screening - Comments:: Wears rx glasses - up to date with routine eye exams with  Dr.GininGack  Dietary issues and exercise activities discussed: Current Exercise Habits: Home exercise routine, Type of exercise: walking, Time (Minutes): 20, Frequency (Times/Week): 2, Weekly Exercise (Minutes/Week): 40, Intensity: Mild, Exercise limited by: neurologic condition(s)   Goals Addressed             This Visit's Progress    Patient Stated   On track    Eat less, sleep better       Depression Screen    07/21/2022   11:54 AM 04/24/2022   12:48 PM 03/10/2022    9:04 AM 01/25/2022   12:51 PM 10/27/2021   10:11 AM 07/25/2021    9:12 AM 07/25/2021    8:59 AM  PHQ 2/9 Scores  PHQ - 2 Score 0 0 0 0 0 0 0  PHQ- 9 Score   0   1     Fall Risk    07/21/2022   11:52 AM 04/24/2022   12:48 PM 03/10/2022    8:27 AM 01/25/2022   12:51 PM 10/27/2021   10:11 AM  Fall Risk   Falls in the past year? 1 1 1  0 0  Number falls in past yr: 1 1 1     Injury with Fall? 1 1 1     Risk for fall due to : History of fall(s);Impaired balance/gait History of fall(s) History of fall(s)    Follow up Education provided;Falls prevention discussed Falls evaluation completed Falls evaluation completed      FALL RISK PREVENTION PERTAINING TO THE HOME:  Any stairs in or around the home? No  If so, are there any without handrails? No  Home free of loose throw rugs in walkways, pet beds, electrical cords, etc? Yes  Adequate lighting in your home to  reduce risk of falls? Yes   ASSISTIVE DEVICES UTILIZED TO PREVENT FALLS:  Life alert? No  Use of a cane, walker or w/c? Yes  Grab bars in the bathroom? Yes  Shower chair or bench in shower? Yes  Elevated toilet seat or a  handicapped toilet? Yes       06/06/2018    4:08 PM  MMSE - Mini Mental State Exam  Orientation to time 5  Orientation to Place 5  Registration 3  Attention/ Calculation 5  Recall 3  Language- name 2 objects 2  Language- repeat 1  Language- follow 3 step command 3  Language- read & follow direction 1  Write a sentence 1  Copy design 1  Total score 30        07/21/2022   11:56 AM 07/19/2021    1:28 PM 07/16/2020    4:43 PM 06/09/2019    2:35 PM  6CIT Screen  What Year? 0 points 0 points 0 points 0 points  What month? 0 points 0 points 0 points 0 points  What time? 0 points 0 points 0 points 0 points  Count back from 20 0 points 0 points 0 points 0 points  Months in reverse 0 points 0 points 0 points 0 points  Repeat phrase 0 points 2 points 0 points 0 points  Total Score 0 points 2 points 0 points 0 points    Immunizations Immunization History  Administered Date(s) Administered   Fluad Quad(high Dose 65+) 01/22/2020, 01/25/2022   Influenza, High Dose Seasonal PF 02/12/2016, 12/31/2018, 01/14/2021   Influenza,inj,Quad PF,6+ Mos 02/17/2013, 12/31/2013, 12/21/2014   Influenza,inj,quad, With Preservative 11/08/2017   Influenza-Unspecified 01/15/2017, 11/08/2017   PFIZER(Purple Top)SARS-COV-2 Vaccination 05/03/2019, 05/31/2019, 01/22/2020   PNEUMOCOCCAL CONJUGATE-20 04/27/2021   Pneumococcal Polysaccharide-23 07/17/2012   Tdap 05/14/2017, 03/03/2022   Zoster Recombinat (Shingrix) 06/06/2018, 10/19/2020   Zoster, Live 02/12/2014    TDAP status: Up to date  Flu Vaccine status: Up to date  Pneumococcal vaccine status: Up to date  Covid-19 vaccine status: Completed vaccines  Qualifies for Shingles Vaccine? Yes   Zostavax completed Yes   Shingrix Completed?: Yes  Screening Tests Health Maintenance  Topic Date Due   COVID-19 Vaccine (4 - 2023-24 season) 11/25/2021   MAMMOGRAM  02/08/2023 (Originally 11/05/2021)   INFLUENZA VACCINE  10/26/2022   Medicare Annual  Wellness (AWV)  07/21/2023   DEXA SCAN  04/05/2027   DTaP/Tdap/Td (3 - Td or Tdap) 03/03/2032   Pneumonia Vaccine 96+ Years old  Completed   Hepatitis C Screening  Completed   Zoster Vaccines- Shingrix  Completed   HPV VACCINES  Aged Out   PAP SMEAR-Modifier  Discontinued   COLONOSCOPY (Pts 45-81yrs Insurance coverage will need to be confirmed)  Discontinued    Health Maintenance  Health Maintenance Due  Topic Date Due   COVID-19 Vaccine (4 - 2023-24 season) 11/25/2021    Colorectal cancer screening: No longer required.   Mammogram status: No longer required due to age.  Bone Density status: Completed 04/04/2022. Results reflect: Bone density results: OSTEOPENIA. Repeat every 5 years.  Lung Cancer Screening: (Low Dose CT Chest recommended if Age 75-80 years, 30 pack-year currently smoking OR have quit w/in 15years.) does not qualify.   Lung Cancer Screening Referral: n/a  Additional Screening:  Hepatitis C Screening: does not qualify; Completed 10/30/2018  Vision Screening: Recommended annual ophthalmology exams for early detection of glaucoma and other disorders of the eye. Is the patient up to date with their annual eye  exam?  Yes  Who is the provider or what is the name of the office in which the patient attends annual eye exams? Dr.Ginkingack  If pt is not established with a provider, would they like to be referred to a provider to establish care? No .   Dental Screening: Recommended annual dental exams for proper oral hygiene  Community Resource Referral / Chronic Care Management: CRR required this visit?  No   CCM required this visit?  No      Plan:     I have personally reviewed and noted the following in the patient's chart:   Medical and social history Use of alcohol, tobacco or illicit drugs  Current medications and supplements including opioid prescriptions. Patient is not currently taking opioid prescriptions. Functional ability and  status Nutritional status Physical activity Advanced directives List of other physicians Hospitalizations, surgeries, and ER visits in previous 12 months Vitals Screenings to include cognitive, depression, and falls Referrals and appointments  In addition, I have reviewed and discussed with patient certain preventive protocols, quality metrics, and best practice recommendations. A written personalized care plan for preventive services as well as general preventive health recommendations were provided to patient.     Lorrene Reid, LPN   1/61/0960   Nurse Notes: none

## 2022-07-21 NOTE — Patient Instructions (Signed)
Brittany Mercer , Thank you for taking time to come for your Medicare Wellness Visit. I appreciate your ongoing commitment to your health goals. Please review the following plan we discussed and let me know if I can assist you in the future.   These are the goals we discussed:  Goals      AWV     07/19/2021 AWV Goal: Fall Prevention  Over the next year, patient will decrease their risk for falls by: Using assistive devices, such as a cane or walker, as needed Identifying fall risks within their home and correcting them by: Removing throw rugs Adding handrails to stairs or ramps Removing clutter and keeping a clear pathway throughout the home Increasing light, especially at night Adding shower handles/bars Raising toilet seat Identifying potential personal risk factors for falls: Medication side effects Incontinence/urgency Vestibular dysfunction Hearing loss Musculoskeletal disorders Neurological disorders Orthostatic hypotension       Patient Stated     Eat less, sleep better        This is a list of the screening recommended for you and due dates:  Health Maintenance  Topic Date Due   COVID-19 Vaccine (4 - 2023-24 season) 11/25/2021   Mammogram  02/08/2023*   Flu Shot  10/26/2022   Medicare Annual Wellness Visit  07/21/2023   DEXA scan (bone density measurement)  04/05/2027   DTaP/Tdap/Td vaccine (3 - Td or Tdap) 03/03/2032   Pneumonia Vaccine  Completed   Hepatitis C Screening: USPSTF Recommendation to screen - Ages 54-79 yo.  Completed   Zoster (Shingles) Vaccine  Completed   HPV Vaccine  Aged Out   Pap Smear  Discontinued   Colon Cancer Screening  Discontinued  *Topic was postponed. The date shown is not the original due date.    Advanced directives: Please bring a copy of your health care power of attorney and living will to the office to be added to your chart at your convenience.   Conditions/risks identified: Aim for 30 minutes of exercise or brisk walking,  6-8 glasses of water, and 5 servings of fruits and vegetables each day.   Next appointment: Follow up in one year for your annual wellness visit    Preventive Care 65 Years and Older, Female Preventive care refers to lifestyle choices and visits with your health care provider that can promote health and wellness. What does preventive care include? A yearly physical exam. This is also called an annual well check. Dental exams once or twice a year. Routine eye exams. Ask your health care provider how often you should have your eyes checked. Personal lifestyle choices, including: Daily care of your teeth and gums. Regular physical activity. Eating a healthy diet. Avoiding tobacco and drug use. Limiting alcohol use. Practicing safe sex. Taking low-dose aspirin every day. Taking vitamin and mineral supplements as recommended by your health care provider. What happens during an annual well check? The services and screenings done by your health care provider during your annual well check will depend on your age, overall health, lifestyle risk factors, and family history of disease. Counseling  Your health care provider may ask you questions about your: Alcohol use. Tobacco use. Drug use. Emotional well-being. Home and relationship well-being. Sexual activity. Eating habits. History of falls. Memory and ability to understand (cognition). Work and work Astronomer. Reproductive health. Screening  You may have the following tests or measurements: Height, weight, and BMI. Blood pressure. Lipid and cholesterol levels. These may be checked every 5 years, or more  frequently if you are over 42 years old. Skin check. Lung cancer screening. You may have this screening every year starting at age 64 if you have a 30-pack-year history of smoking and currently smoke or have quit within the past 15 years. Fecal occult blood test (FOBT) of the stool. You may have this test every year starting at  age 65. Flexible sigmoidoscopy or colonoscopy. You may have a sigmoidoscopy every 5 years or a colonoscopy every 10 years starting at age 68. Hepatitis C blood test. Hepatitis B blood test. Sexually transmitted disease (STD) testing. Diabetes screening. This is done by checking your blood sugar (glucose) after you have not eaten for a while (fasting). You may have this done every 1-3 years. Bone density scan. This is done to screen for osteoporosis. You may have this done starting at age 50. Mammogram. This may be done every 1-2 years. Talk to your health care provider about how often you should have regular mammograms. Talk with your health care provider about your test results, treatment options, and if necessary, the need for more tests. Vaccines  Your health care provider may recommend certain vaccines, such as: Influenza vaccine. This is recommended every year. Tetanus, diphtheria, and acellular pertussis (Tdap, Td) vaccine. You may need a Td booster every 10 years. Zoster vaccine. You may need this after age 68. Pneumococcal 13-valent conjugate (PCV13) vaccine. One dose is recommended after age 4. Pneumococcal polysaccharide (PPSV23) vaccine. One dose is recommended after age 38. Talk to your health care provider about which screenings and vaccines you need and how often you need them. This information is not intended to replace advice given to you by your health care provider. Make sure you discuss any questions you have with your health care provider. Document Released: 04/09/2015 Document Revised: 12/01/2015 Document Reviewed: 01/12/2015 Elsevier Interactive Patient Education  2017 [employer].  Fall Prevention in the Home Falls can cause injuries. They can happen to people of all ages. There are many things you can do to make your home safe and to help prevent falls. What can I do on the outside of my home? Regularly fix the edges of walkways and driveways and fix any  cracks. Remove anything that might make you trip as you walk through a door, such as a raised step or threshold. Trim any bushes or trees on the path to your home. Use bright outdoor lighting. Clear any walking paths of anything that might make someone trip, such as rocks or tools. Regularly check to see if handrails are loose or broken. Make sure that both sides of any steps have handrails. Any raised decks and porches should have guardrails on the edges. Have any leaves, snow, or ice cleared regularly. Use sand or salt on walking paths during winter. Clean up any spills in your garage right away. This includes oil or grease spills. What can I do in the bathroom? Use night lights. Install grab bars by the toilet and in the tub and shower. Do not use towel bars as grab bars. Use non-skid mats or decals in the tub or shower. If you need to sit down in the shower, use a plastic, non-slip stool. Keep the floor dry. Clean up any water that spills on the floor as soon as it happens. Remove soap buildup in the tub or shower regularly. Attach bath mats securely with double-sided non-slip rug tape. Do not have throw rugs and other things on the floor that can make you trip. What can  I do in the bedroom? Use night lights. Make sure that you have a light by your bed that is easy to reach. Do not use any sheets or blankets that are too big for your bed. They should not hang down onto the floor. Have a firm chair that has side arms. You can use this for support while you get dressed. Do not have throw rugs and other things on the floor that can make you trip. What can I do in the kitchen? Clean up any spills right away. Avoid walking on wet floors. Keep items that you use a lot in easy-to-reach places. If you need to reach something above you, use a strong step stool that has a grab bar. Keep electrical cords out of the way. Do not use floor polish or wax that makes floors slippery. If you must  use wax, use non-skid floor wax. Do not have throw rugs and other things on the floor that can make you trip. What can I do with my stairs? Do not leave any items on the stairs. Make sure that there are handrails on both sides of the stairs and use them. Fix handrails that are broken or loose. Make sure that handrails are as long as the stairways. Check any carpeting to make sure that it is firmly attached to the stairs. Fix any carpet that is loose or worn. Avoid having throw rugs at the top or bottom of the stairs. If you do have throw rugs, attach them to the floor with carpet tape. Make sure that you have a light switch at the top of the stairs and the bottom of the stairs. If you do not have them, ask someone to add them for you. What else can I do to help prevent falls? Wear shoes that: Do not have high heels. Have rubber bottoms. Are comfortable and fit you well. Are closed at the toe. Do not wear sandals. If you use a stepladder: Make sure that it is fully opened. Do not climb a closed stepladder. Make sure that both sides of the stepladder are locked into place. Ask someone to hold it for you, if possible. Clearly mark and make sure that you can see: Any grab bars or handrails. First and last steps. Where the edge of each step is. Use tools that help you move around (mobility aids) if they are needed. These include: Canes. Walkers. Scooters. Crutches. Turn on the lights when you go into a dark area. Replace any light bulbs as soon as they burn out. Set up your furniture so you have a clear path. Avoid moving your furniture around. If any of your floors are uneven, fix them. If there are any pets around you, be aware of where they are. Review your medicines with your doctor. Some medicines can make you feel dizzy. This can increase your chance of falling. Ask your doctor what other things that you can do to help prevent falls. This information is not intended to replace  advice given to you by your health care provider. Make sure you discuss any questions you have with your health care provider. Document Released: 01/07/2009 Document Revised: 08/19/2015 Document Reviewed: 04/17/2014 Elsevier Interactive Patient Education  2017 [employer].

## 2022-07-24 ENCOUNTER — Ambulatory Visit: Payer: Medicare Other | Admitting: Family Medicine

## 2022-07-27 ENCOUNTER — Other Ambulatory Visit: Payer: Self-pay | Admitting: Family Medicine

## 2022-08-03 ENCOUNTER — Encounter: Payer: Self-pay | Admitting: Family Medicine

## 2022-08-03 ENCOUNTER — Ambulatory Visit (INDEPENDENT_AMBULATORY_CARE_PROVIDER_SITE_OTHER): Payer: Medicare Other | Admitting: Family Medicine

## 2022-08-03 VITALS — BP 116/60 | HR 69 | Temp 97.2°F | Ht 69.0 in | Wt 186.2 lb

## 2022-08-03 DIAGNOSIS — E782 Mixed hyperlipidemia: Secondary | ICD-10-CM | POA: Diagnosis not present

## 2022-08-03 DIAGNOSIS — G609 Hereditary and idiopathic neuropathy, unspecified: Secondary | ICD-10-CM | POA: Diagnosis not present

## 2022-08-03 DIAGNOSIS — M545 Low back pain, unspecified: Secondary | ICD-10-CM | POA: Diagnosis not present

## 2022-08-03 DIAGNOSIS — I10 Essential (primary) hypertension: Secondary | ICD-10-CM | POA: Diagnosis not present

## 2022-08-03 MED ORDER — TAPENTADOL HCL 100 MG PO TABS: 100.0000 mg | ORAL_TABLET | Freq: Four times a day (QID) | ORAL | 0 refills | Status: DC

## 2022-08-03 MED ORDER — MELOXICAM 15 MG PO TABS
15.0000 mg | ORAL_TABLET | Freq: Every day | ORAL | 5 refills | Status: DC
Start: 2022-08-03 — End: 2022-10-18

## 2022-08-03 MED ORDER — TIZANIDINE HCL 4 MG PO TABS
4.0000 mg | ORAL_TABLET | Freq: Four times a day (QID) | ORAL | 1 refills | Status: DC | PRN
Start: 2022-08-03 — End: 2022-10-18

## 2022-08-03 MED ORDER — TAPENTADOL HCL 100 MG PO TABS
100.0000 mg | ORAL_TABLET | Freq: Four times a day (QID) | ORAL | 0 refills | Status: DC
Start: 2022-10-04 — End: 2022-10-18

## 2022-08-03 NOTE — Progress Notes (Signed)
Subjective:  Patient ID: Brittany Mercer, female    DOB: [birth year]  Age: 77 y.o. MRN: [id number]  CC: Medical Management of Chronic Issues   HPI Brittany Mercer presents for review of chronic neuropathy pain in the legs. It is stable and controlled by the nucynta.   Chronic low back and hip pain also. Pain in baack is getting worse. Not responding to aleve.   Cannot tolerate traditional lipid meds. Cannot afford repatha, et.al.   presents for  follow-up of hypertension. Patient has no history of headache chest pain or shortness of breath or recent cough. Patient also denies symptoms of TIA such as focal numbness or weakness. Patient denies side effects from medication. States taking it regularly. .     08/03/2022   11:00 AM 07/21/2022   11:54 AM 04/24/2022   12:48 PM  Depression screen PHQ 2/9  Decreased Interest 0 0 0  Down, Depressed, Hopeless 0 0 0  PHQ - 2 Score 0 0 0    History Brittany Mercer has a past medical history of Cataract, Chronic narcotic use, Headache, Hereditary and idiopathic peripheral neuropathy, Hyperlipidemia, Hypertension, Hypokalemia, OA (osteoarthritis), Peripheral neuropathy, and Wears glasses.   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   Her family history includes Arthritis in her brother; Dementia in her father; Heart disease in her mother; Hyperlipidemia in her mother; Hypertension in her brother, father, and mother; Neuropathy in her father; Stroke in her mother.She reports that she quit smoking about 24 years ago. Her smoking use included  cigarettes. She has a 15.00 pack-year smoking history. She has never used smokeless tobacco. She reports current alcohol use of about 4.0 standard drinks of alcohol per week. She reports that she does not use drugs.    ROS Review of Systems  Constitutional: Negative.   HENT: Negative.    Eyes:  Negative for visual disturbance.  Respiratory:  Negative for shortness of breath.   Cardiovascular:  Negative for chest pain.  Gastrointestinal:  Negative for abdominal pain.  Musculoskeletal:  Positive for arthralgias (left hip, responds to aleve and heat).    Objective:  BP 116/60   Pulse 69   Temp (!) 97.2 F (36.2 C)   Ht 5\' 9"  (1.753 m)   Wt 186 lb 3.2 oz (84.5 kg)   SpO2 98%   BMI 27.50 kg/m   BP Readings from Last 3 Encounters:  08/03/22 116/60  05/09/22 (!) 165/85  04/24/22 (!) 162/84    Wt Readings from Last 3 Encounters:  08/03/22 186 lb 3.2 oz (84.5 kg)  07/21/22 183 lb (83 kg)  04/24/22 186 lb 12.8 oz (84.7 kg)     Physical Exam Constitutional:      General: She is not in acute distress.    Appearance: She is well-developed.  HENT:     Head: Normocephalic and atraumatic.  Eyes:     Conjunctiva/sclera: Conjunctivae normal.     Pupils: Pupils are equal, round, and reactive to light.  Neck:     Thyroid: No thyromegaly.  Cardiovascular:     Rate and Rhythm: Normal rate and regular rhythm.     Heart sounds: Normal heart sounds. No murmur  heard. Pulmonary:     Effort: Pulmonary effort is normal. No respiratory distress.     Breath sounds: Normal breath sounds. No wheezing or rales.  Abdominal:     General: Bowel sounds are normal. There is no distension.     Palpations: Abdomen is soft.     Tenderness: There is no abdominal tenderness.  Musculoskeletal:        General: Normal range of motion.     Cervical back: Normal range of motion and neck supple.  Lymphadenopathy:     Cervical: No cervical adenopathy.  Skin:    General: Skin is warm and dry.   Neurological:     Mental Status: She is alert and oriented to person, place, and time.     Deep Tendon Reflexes: Reflexes abnormal.  Psychiatric:        Behavior: Behavior normal.        Thought Content: Thought content normal.        Judgment: Judgment normal.       Assessment & Plan:   Brittany Mercer was seen today for medical management of chronic issues.  Diagnoses and all orders for this visit:  Mixed hyperlipidemia -     AMB Referral to Pharmacy Medication Management  Essential hypertension, benign -     CBC with Differential/Platelet -     CMP14+EGFR  Idiopathic peripheral neuropathy -     Tapentadol HCl 100 MG TABS; Take 1 tablet (100 mg total) by mouth 4 (four) times daily. -     Tapentadol HCl 100 MG TABS; Take 1 tablet (100 mg total) by mouth 4 (four) times daily. -     Tapentadol HCl 100 MG TABS; Take 1 tablet (100 mg total) by mouth 4 (four) times daily for 15 days.  Acute midline low back pain without sciatica -     meloxicam (MOBIC) 15 MG tablet; Take 1 tablet (15 mg total) by mouth daily. For joint and muscle pain -     tiZANidine (ZANAFLEX) 4 MG tablet; Take 1 tablet (4 mg total) by mouth every 6 (six) hours as needed for muscle spasms.       I am having Brittany Mercer. [employer]" start on meloxicam and tiZANidine. I am also having her maintain her prednisoLONE acetate, fenofibrate, estradiol, metoprolol succinate, potassium chloride, Tapentadol HCl, Tapentadol HCl, and Tapentadol HCl.  Allergies as of 08/03/2022       Reactions   Keflex [cephalexin]    GI, couldn't eat/drink, felt very sick   Statins Other (See Comments)   Myalgia, turns red   Zetia [ezetimibe] Other (See Comments)   Myopathy/redness   Penicillins Rash, Other (See Comments)   Pt not sure if she is allergic to penicillin or sulfa. So she does not take either one.  DID THE REACTION INVOLVE: Swelling of the face/tongue/throat, SOB, or low BP? Unknown Sudden or severe rash/hives, skin  peeling, or the inside of the mouth or nose? No Did it require medical treatment? No When did it last happen?      childhood allergy If all above answers are "NO", may proceed with cephalosporin use.   Sulfa Antibiotics Rash, Other (See Comments)   Pt not sure if she is allergic to sulfa or penicillin so she doesn't take either one.         Medication List        Accurate as of Aug 03, 2022 11:59 PM. If you have any questions, ask your nurse or doctor.  estradiol 0.1 MG/GM vaginal cream Commonly known as: ESTRACE Place 1 Applicatorful vaginally once a week. At bed time   fenofibrate 160 MG tablet Take 1 tablet (160 mg total) by mouth daily.   meloxicam 15 MG tablet Commonly known as: MOBIC Take 1 tablet (15 mg total) by mouth daily. For joint and muscle pain Started by: Mechele Claude, MD   metoprolol succinate 50 MG 24 hr tablet Commonly known as: TOPROL-XL Take with or immediately following a meal.   potassium chloride 10 MEQ tablet Commonly known as: KLOR-CON TAKE 2 TO 3 TABLETS BY MOUTH DAILY AS DIRECTED   prednisoLONE acetate 1 % ophthalmic suspension Commonly known as: PRED FORTE Place 1 drop into the right eye 4 (four) times daily.   Tapentadol HCl 100 MG Tabs Take 1 tablet (100 mg total) by mouth 4 (four) times daily. What changed: Another medication with the same name was changed. Make sure you understand how and when to take each. Changed by: Mechele Claude, MD   Tapentadol HCl 100 MG Tabs Take 1 tablet (100 mg total) by mouth 4 (four) times daily. Start taking on: September 04, 2022 What changed: These instructions start on September 04, 2022. If you are unsure what to do until then, ask your doctor or other care provider. Changed by: Mechele Claude, MD   Tapentadol HCl 100 MG Tabs Take 1 tablet (100 mg total) by mouth 4 (four) times daily for 15 days. Start taking on: October 04, 2022 What changed: These instructions start on October 04, 2022. If you are  unsure what to do until then, ask your doctor or other care provider. Changed by: Mechele Claude, MD   tiZANidine 4 MG tablet Commonly known as: ZANAFLEX Take 1 tablet (4 mg total) by mouth every 6 (six) hours as needed for muscle spasms. Started by: Mechele Claude, MD         Follow-up: Return in about 3 months (around 11/03/2022).  Mechele Claude, M.D.

## 2022-08-04 ENCOUNTER — Encounter: Payer: Self-pay | Admitting: Family Medicine

## 2022-08-10 ENCOUNTER — Telehealth: Payer: Self-pay

## 2022-08-10 NOTE — Progress Notes (Signed)
   Care Guide Note  08/10/2022 Name: Brittany Mercer MRN: [id number] DOB: 1946/02/11  Referred by: Mechele Claude, MD Reason for referral : Care Coordination (Outreach to schedule with Pharm d )   Brittany Mercer is a 77 y.o. year old female who is a primary care patient of Stacks, Broadus John, MD. Brittany Mercer was referred to the pharmacist for assistance related to HLD.    Successful contact was made with the patient to discuss pharmacy services including being ready for the pharmacist to call at least 5 minutes before the scheduled appointment time, to have medication bottles and any blood sugar or blood pressure readings ready for review. The patient agreed to meet with the pharmacist via with the pharmacist via telephone visit on (date/time).  09/07/2022  Brittany Mercer, RMA Care Guide [facility]  [place], [place] [address] Direct Dial: [phone] [email or web address]

## 2022-09-07 ENCOUNTER — Ambulatory Visit (INDEPENDENT_AMBULATORY_CARE_PROVIDER_SITE_OTHER): Payer: Medicare Other | Admitting: Pharmacist

## 2022-09-07 DIAGNOSIS — E782 Mixed hyperlipidemia: Secondary | ICD-10-CM

## 2022-09-14 NOTE — Progress Notes (Signed)
09/07/2022 Name: Brittany Mercer          MRN: [id number]       DOB: September 24, 1945     HPI:   Brittany Mercer is a 77 y.o. female patient referred to lipid clinic by PCP. PMH is significant for:       Past Medical History:  Diagnosis Date   Cataract     Chronic narcotic use      takes Nucynta for neuropathic pain   Headache     Hereditary and idiopathic peripheral neuropathy      followed by pcp   Hyperlipidemia     Hypertension     Hypokalemia     OA (osteoarthritis)      LEFT KNEE   Peripheral neuropathy     Wears glasses        Current Medications:       Current Outpatient Medications on File Prior to Visit  Medication Sig Dispense Refill   estradiol (ESTRACE) 0.1 MG/GM vaginal cream Place 1 Applicatorful vaginally once a week. At bed time 42.5 g 12   fenofibrate 160 MG tablet Take 1 tablet (160 mg total) by mouth daily. 90 tablet 3   metoprolol succinate (TOPROL-XL) 50 MG 24 hr tablet Take with or immediately following a meal. 90 tablet 2   potassium chloride (KLOR-CON) 10 MEQ tablet TAKE 2 TO 3 TABLETS BY MOUTH DAILY AS DIRECTED 270 tablet 3   prednisoLONE acetate (PRED FORTE) 1 % ophthalmic suspension Place 1 drop into the right eye 4 (four) times daily.       Tapentadol HCl 100 MG TABS Take 1 tablet (100 mg total) by mouth 4 (four) times daily. 120 tablet 0   Tapentadol HCl 100 MG TABS Take 1 tablet (100 mg total) by mouth 4 (four) times daily. 120 tablet 0   Tapentadol HCl 100 MG TABS Take 1 tablet (100 mg total) by mouth 4 (four) times daily. 120 tablet 0    No current facility-administered medications on file prior to visit.    Intolerances: 3 OR MORE STATINS PLUS EZETIMIBE; bempedoic acid MYOPATHY REDNESS   Risk Factors: htn   LDL goal: <100   Diet: recommend healthy plate and mediterranean diet   Exercise: encouraged   Family History: Her family history includes Arthritis in her brother; Dementia in her father; Heart disease in  her mother; Hyperlipidemia in her mother; Hypertension in her brother, father, and mother; Neuropathy in her father; Stroke in her mother.She reports that she quit smoking about 23 years ago. Her smoking use included cigarettes. She has a 15.00 pack-year smoking history. She has never used smokeless tobacco. She reports current alcohol use of about 4.0 standard drinks of alcohol per week. She reports that she does not use drugs.    Labs:  Lipid Panel  Labs (Brief)          Component Value Date/Time    CHOL 203 (H) 04/24/2022 1356    CHOL 141 07/17/2012 0915    TRIG 198 (H) 04/24/2022 1356    TRIG 308 (H) 06/08/2014 1144    TRIG 116 07/17/2012 0915    HDL 43 04/24/2022 1356    HDL 31 (L) 06/08/2014 1144    HDL 37 (L) 07/17/2012 0915    CHOLHDL 4.7 (H) 04/24/2022 1356    LDLCALC 125 (H) 04/24/2022 1356    LDLCALC 100 (H) 10/07/2013 0839    LDLCALC 81 07/17/2012 0915    LABVLDL 35  04/24/2022 1356                Current Outpatient Medications on File Prior to Visit  Medication Sig Dispense Refill   estradiol (ESTRACE) 0.1 MG/GM vaginal cream Place 1 Applicatorful vaginally once a week. At bed time 42.5 g 12   fenofibrate 160 MG tablet Take 1 tablet (160 mg total) by mouth daily. 90 tablet 3   metoprolol succinate (TOPROL-XL) 50 MG 24 hr tablet Take with or immediately following a meal. 90 tablet 2   potassium chloride (KLOR-CON) 10 MEQ tablet TAKE 2 TO 3 TABLETS BY MOUTH DAILY AS DIRECTED 270 tablet 3   prednisoLONE acetate (PRED FORTE) 1 % ophthalmic suspension Place 1 drop into the right eye 4 (four) times daily.       Tapentadol HCl 100 MG TABS Take 1 tablet (100 mg total) by mouth 4 (four) times daily. 120 tablet 0   Tapentadol HCl 100 MG TABS Take 1 tablet (100 mg total) by mouth 4 (four) times daily. 120 tablet 0   Tapentadol HCl 100 MG TABS Take 1 tablet (100 mg total) by mouth 4 (four) times daily. 120 tablet 0    No current facility-administered medications on file prior to  visit.             Allergies  Allergen Reactions   Statins Other (See Comments)      Myalgia, turns red   Penicillins Rash and Other (See Comments)      Pt not sure if she is allergic to penicillin or sulfa. So she does not take either one.  DID THE REACTION INVOLVE: Swelling of the face/tongue/throat, SOB, or low BP? Unknown Sudden or severe rash/hives, skin peeling, or the inside of the mouth or nose? No Did it require medical treatment? No When did it last happen?      childhood allergy If all above answers are "NO", may proceed with cephalosporin use.   Sulfa Antibiotics Rash and Other (See Comments)      Pt not sure if she is allergic to sulfa or penicillin so she doesn't take either one.       Assessment/Plan:     1. Hyperlipidemia - [employer] GRANT APPROVED (to be used for Repatha) Local pharmacy was not accepting her healthwell card  Encouraged patient to go online and fill out a claim submission form with [employer] and she can seek reimbursement  I can assist patient with this if she will retrieve receipts from the pharmacy  Kennedy Bucker approved until 03/21/23  New RX for Repatha sent to pharmacy with copay card info REPATHA PREFERRED--Continue ENCOURAGE DIETARY CHANGES-LOW FAT, MEDITERRANEAN DIET     Kieth Brightly, PharmD, BCACP Clinical Pharmacist, [facility] [place]  II  T [phone]

## 2022-09-19 ENCOUNTER — Encounter: Payer: Self-pay | Admitting: Pharmacist

## 2022-09-19 MED ORDER — REPATHA SURECLICK 140 MG/ML ~~LOC~~ SOAJ
140.0000 mg | SUBCUTANEOUS | 2 refills | Status: DC
Start: 2022-09-19 — End: 2022-09-19

## 2022-09-19 MED ORDER — REPATHA SURECLICK 140 MG/ML ~~LOC~~ SOAJ
140.0000 mg | SUBCUTANEOUS | 2 refills | Status: DC
Start: 2022-09-19 — End: 2022-12-14

## 2022-09-21 ENCOUNTER — Encounter: Payer: Self-pay | Admitting: Family Medicine

## 2022-09-21 ENCOUNTER — Other Ambulatory Visit: Payer: Self-pay | Admitting: Family Medicine

## 2022-09-21 MED ORDER — PREDNISONE 10 MG PO TABS: ORAL_TABLET | ORAL | 0 refills | Status: DC

## 2022-10-18 ENCOUNTER — Encounter (HOSPITAL_COMMUNITY): Payer: Self-pay | Admitting: Emergency Medicine

## 2022-10-18 ENCOUNTER — Encounter: Payer: Self-pay | Admitting: Family Medicine

## 2022-10-18 ENCOUNTER — Ambulatory Visit (INDEPENDENT_AMBULATORY_CARE_PROVIDER_SITE_OTHER): Payer: Medicare Other | Admitting: Family Medicine

## 2022-10-18 ENCOUNTER — Other Ambulatory Visit: Payer: Self-pay

## 2022-10-18 ENCOUNTER — Emergency Department (HOSPITAL_COMMUNITY)
Admission: EM | Admit: 2022-10-18 | Discharge: 2022-10-18 | Disposition: A | Discharge status: Home / Self Care | Payer: Medicare Other | Attending: Emergency Medicine | Admitting: Emergency Medicine

## 2022-10-18 ENCOUNTER — Emergency Department (HOSPITAL_COMMUNITY): Payer: Medicare Other

## 2022-10-18 VITALS — BP 217/90 | HR 76 | Temp 97.7°F | Ht 69.0 in | Wt 167.6 lb

## 2022-10-18 DIAGNOSIS — R748 Abnormal levels of other serum enzymes: Secondary | ICD-10-CM | POA: Insufficient documentation

## 2022-10-18 DIAGNOSIS — R1011 Right upper quadrant pain: Secondary | ICD-10-CM | POA: Diagnosis not present

## 2022-10-18 DIAGNOSIS — E782 Mixed hyperlipidemia: Secondary | ICD-10-CM

## 2022-10-18 DIAGNOSIS — G609 Hereditary and idiopathic neuropathy, unspecified: Secondary | ICD-10-CM | POA: Diagnosis not present

## 2022-10-18 DIAGNOSIS — K449 Diaphragmatic hernia without obstruction or gangrene: Secondary | ICD-10-CM | POA: Diagnosis not present

## 2022-10-18 DIAGNOSIS — R1010 Upper abdominal pain, unspecified: Secondary | ICD-10-CM | POA: Diagnosis present

## 2022-10-18 DIAGNOSIS — R112 Nausea with vomiting, unspecified: Secondary | ICD-10-CM | POA: Diagnosis not present

## 2022-10-18 DIAGNOSIS — Z79899 Other long term (current) drug therapy: Secondary | ICD-10-CM | POA: Diagnosis not present

## 2022-10-18 DIAGNOSIS — R100 Acute abdomen: Secondary | ICD-10-CM | POA: Diagnosis not present

## 2022-10-18 DIAGNOSIS — R1084 Generalized abdominal pain: Secondary | ICD-10-CM | POA: Diagnosis not present

## 2022-10-18 DIAGNOSIS — M549 Dorsalgia, unspecified: Secondary | ICD-10-CM | POA: Diagnosis not present

## 2022-10-18 DIAGNOSIS — R109 Unspecified abdominal pain: Secondary | ICD-10-CM

## 2022-10-18 DIAGNOSIS — I10 Essential (primary) hypertension: Secondary | ICD-10-CM

## 2022-10-18 DIAGNOSIS — K5790 Diverticulosis of intestine, part unspecified, without perforation or abscess without bleeding: Secondary | ICD-10-CM | POA: Diagnosis not present

## 2022-10-18 LAB — CBC
HCT: 44.1 % (ref 36.0–46.0)
Hemoglobin: 15.5 g/dL — ABNORMAL HIGH (ref 12.0–15.0)
MCH: 31.7 pg (ref 26.0–34.0)
MCHC: 35.1 g/dL (ref 30.0–36.0)
MCV: 90.2 fL (ref 80.0–100.0)
Platelets: 157 10*3/uL (ref 150–400)
RBC: 4.89 MIL/uL (ref 3.87–5.11)
RDW: 13.1 % (ref 11.5–15.5)
WBC: 5.9 10*3/uL (ref 4.0–10.5)
nRBC: 0 % (ref 0.0–0.2)

## 2022-10-18 LAB — CBC WITH DIFFERENTIAL/PLATELET
Basophils Absolute: 0 10*3/uL (ref 0.0–0.2)
Basos: 0 %
EOS (ABSOLUTE): 0.2 10*3/uL (ref 0.0–0.4)
Eos: 4 %
Hematocrit: 44.7 % (ref 34.0–46.6)
Hemoglobin: 15.1 g/dL (ref 11.1–15.9)
Immature Grans (Abs): 0 10*3/uL (ref 0.0–0.1)
Immature Granulocytes: 0 %
Lymphocytes Absolute: 1.3 10*3/uL (ref 0.7–3.1)
Lymphs: 22 %
MCH: 31.4 pg (ref 26.6–33.0)
MCHC: 33.8 g/dL (ref 31.5–35.7)
MCV: 93 fL (ref 79–97)
Monocytes Absolute: 0.5 10*3/uL (ref 0.1–0.9)
Monocytes: 8 %
Neutrophils Absolute: 3.9 10*3/uL (ref 1.4–7.0)
Neutrophils: 66 %
Platelets: 172 10*3/uL (ref 150–450)
RBC: 4.81 x10E6/uL (ref 3.77–5.28)
RDW: 13 % (ref 11.7–15.4)
WBC: 5.9 10*3/uL (ref 3.4–10.8)

## 2022-10-18 LAB — URINALYSIS
Bilirubin, UA: NEGATIVE
Glucose, UA: NEGATIVE
Ketones, UA: NEGATIVE
Leukocytes,UA: NEGATIVE
Nitrite, UA: NEGATIVE
Protein,UA: NEGATIVE
RBC, UA: NEGATIVE
Specific Gravity, UA: 1.025 (ref 1.005–1.030)
Urobilinogen, Ur: 0.2 mg/dL (ref 0.2–1.0)
pH, UA: 5 (ref 5.0–7.5)

## 2022-10-18 LAB — URINALYSIS, ROUTINE W REFLEX MICROSCOPIC
Bilirubin Urine: NEGATIVE
Glucose, UA: NEGATIVE mg/dL
Hgb urine dipstick: NEGATIVE
Ketones, ur: NEGATIVE mg/dL
Leukocytes,Ua: NEGATIVE
Nitrite: NEGATIVE
Protein, ur: NEGATIVE mg/dL
Specific Gravity, Urine: 1.029 (ref 1.005–1.030)
pH: 6 (ref 5.0–8.0)

## 2022-10-18 LAB — CMP14+EGFR
ALT: 19 IU/L (ref 0–32)
AST: 18 IU/L (ref 0–40)
Albumin: 4.6 g/dL (ref 3.8–4.8)
Alkaline Phosphatase: 67 IU/L (ref 44–121)
BUN/Creatinine Ratio: 28 (ref 12–28)
BUN: 25 mg/dL (ref 8–27)
Bilirubin Total: 1 mg/dL (ref 0.0–1.2)
CO2: 24 mmol/L (ref 20–29)
Calcium: 9.9 mg/dL (ref 8.7–10.3)
Chloride: 108 mmol/L — ABNORMAL HIGH (ref 96–106)
Creatinine, Ser: 0.89 mg/dL (ref 0.57–1.00)
Globulin, Total: 1.8 g/dL (ref 1.5–4.5)
Glucose: 92 mg/dL (ref 70–99)
Potassium: 4.2 mmol/L (ref 3.5–5.2)
Sodium: 143 mmol/L (ref 134–144)
Total Protein: 6.4 g/dL (ref 6.0–8.5)
eGFR: 67 mL/min/{1.73_m2} (ref >59)

## 2022-10-18 LAB — COMPREHENSIVE METABOLIC PANEL
ALT: 18 U/L (ref 0–44)
AST: 18 U/L (ref 15–41)
Albumin: 4.6 g/dL (ref 3.5–5.0)
Alkaline Phosphatase: 52 U/L (ref 38–126)
Anion gap: 7 (ref 5–15)
BUN: 24 mg/dL — ABNORMAL HIGH (ref 8–23)
CO2: 25 mmol/L (ref 22–32)
Calcium: 9.6 mg/dL (ref 8.9–10.3)
Chloride: 110 mmol/L (ref 98–111)
Creatinine, Ser: 0.78 mg/dL (ref 0.44–1.00)
GFR, Estimated: >60 mL/min (ref >60)
Glucose, Bld: 94 mg/dL (ref 70–99)
Potassium: 3.5 mmol/L (ref 3.5–5.1)
Sodium: 142 mmol/L (ref 135–145)
Total Bilirubin: 1.4 mg/dL — ABNORMAL HIGH (ref 0.3–1.2)
Total Protein: 7.2 g/dL (ref 6.5–8.1)

## 2022-10-18 LAB — LIPID PANEL
Chol/HDL Ratio: 2.7 ratio (ref 0.0–4.4)
Cholesterol, Total: 134 mg/dL (ref 100–199)
HDL: 49 mg/dL (ref >39)
LDL Chol Calc (NIH): 55 mg/dL (ref 0–99)
Triglycerides: 185 mg/dL — ABNORMAL HIGH (ref 0–149)
VLDL Cholesterol Cal: 30 mg/dL (ref 5–40)

## 2022-10-18 LAB — AMYLASE: Amylase: 199 U/L — ABNORMAL HIGH (ref 31–110)

## 2022-10-18 LAB — LIPASE, BLOOD: Lipase: 57 U/L — ABNORMAL HIGH (ref 11–51)

## 2022-10-18 LAB — LIPASE: Lipase: 78 U/L (ref 14–85)

## 2022-10-18 MED ORDER — AMLODIPINE BESYLATE 5 MG PO TABS: 5.0000 mg | ORAL_TABLET | Freq: Every day | ORAL | 0 refills | Status: DC

## 2022-10-18 MED ORDER — ONDANSETRON 8 MG PO TBDP: 8.0000 mg | ORAL_TABLET | Freq: Three times a day (TID) | ORAL | 0 refills | Status: DC | PRN

## 2022-10-18 MED ORDER — TAPENTADOL HCL 100 MG PO TABS
100.0000 mg | ORAL_TABLET | Freq: Four times a day (QID) | ORAL | 0 refills | Status: DC
Start: 2022-11-17 — End: 2023-02-23

## 2022-10-18 MED ORDER — ONDANSETRON HCL 4 MG/2ML IJ SOLN
4.0000 mg | Freq: Once | INTRAMUSCULAR | Status: AC
  Administered 2022-10-18: 4 mg via INTRAVENOUS
  Filled 2022-10-18: qty 2

## 2022-10-18 MED ORDER — PANTOPRAZOLE SODIUM 40 MG PO TBEC: 40.0000 mg | DELAYED_RELEASE_TABLET | Freq: Every day | ORAL | 0 refills | Status: DC

## 2022-10-18 MED ORDER — AMLODIPINE BESYLATE 5 MG PO TABS
5.0000 mg | ORAL_TABLET | Freq: Once | ORAL | Status: AC
  Administered 2022-10-18: 5 mg via ORAL
  Filled 2022-10-18: qty 1

## 2022-10-18 MED ORDER — MORPHINE SULFATE (PF) 4 MG/ML IV SOLN
4.0000 mg | Freq: Once | INTRAVENOUS | Status: AC
  Administered 2022-10-18: 4 mg via INTRAVENOUS
  Filled 2022-10-18: qty 1

## 2022-10-18 MED ORDER — TAPENTADOL HCL 100 MG PO TABS
100.0000 mg | ORAL_TABLET | Freq: Four times a day (QID) | ORAL | 0 refills | Status: DC
Start: 2022-10-18 — End: 2023-02-23

## 2022-10-18 MED ORDER — TAPENTADOL HCL 100 MG PO TABS
100.0000 mg | ORAL_TABLET | Freq: Four times a day (QID) | ORAL | 0 refills | Status: DC
Start: 2022-12-16 — End: 2022-12-07

## 2022-10-18 MED ORDER — METOPROLOL SUCCINATE ER 50 MG PO TB24
ORAL_TABLET | ORAL | 2 refills | Status: DC
  Filled 2023-01-20: qty 90, 90d supply, fill #0
  Filled 2023-05-23: qty 90, 90d supply, fill #1

## 2022-10-18 MED ORDER — IOHEXOL 350 MG/ML SOLN
100.0000 mL | Freq: Once | INTRAVENOUS | Status: AC | PRN
  Administered 2022-10-18: 100 mL via INTRAVENOUS

## 2022-10-18 NOTE — Discharge Instructions (Addendum)
Start taking the medications for nausea and the antacid to see if that helps with your issues with your appetite and abdominal discomfort.  Follow-up with a GI doctor for further evaluation as we discussed.  Your blood pressure was also elevated today.  This can sometimes be related to your pain but start taking the additional blood pressure medication and follow-up with your primary care doctor to have that rechecked.

## 2022-10-18 NOTE — Progress Notes (Signed)
Subjective:  Patient ID: Brittany Mercer, female    DOB: [birth year]  Age: 77 y.o. MRN: [id number]  CC: Medical Management of Chronic Issues, Weight Loss (unintentional), and Back Pain   HPI Brittany Mercer presents for severe back pain. Food comes back up. Can't eat. Pain is at RUQ and R flank. "Feel like a rock" at right flank. Right costal margin pain 8/10 hurts if she moves. Interfering with sleep- none for 6 days. All of her medication comes back up. Meds tizanidine and meloxicam not helping with pain. Unintentional weight loss over 2 mos. Of 27 lb due to gagging and vomiting. Pt. Says she only ate 6 meals in the last two months due to profuse vomiting and nausea.      10/18/2022    8:00 AM 10/18/2022    7:51 AM 08/03/2022   11:00 AM  Depression screen PHQ 2/9  Decreased Interest 0 0 0  Down, Depressed, Hopeless 1 0 0  PHQ - 2 Score 1 0 0  Altered sleeping 3    Tired, decreased energy 3    Change in appetite 3    Feeling bad or failure about yourself  0    Trouble concentrating 0    Moving slowly or fidgety/restless 0    Suicidal thoughts 0    PHQ-9 Score 10    Difficult doing work/chores Somewhat difficult      History Cadee has a past medical history of Cataract, Chronic narcotic use, Headache, Hereditary and idiopathic peripheral neuropathy, Hyperlipidemia, Hypertension, Hypokalemia, OA (osteoarthritis), Peripheral neuropathy, and Wears glasses.   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   Her family history includes Arthritis in her brother; Dementia in  her father; Heart disease in her mother; Hyperlipidemia in her mother; Hypertension in her brother, father, and mother; Neuropathy in her father; Stroke in her mother.She reports that she quit smoking about 24 years ago. Her smoking use included cigarettes. She started smoking about 54 years ago. She has a 15 pack-year smoking history. She has never used smokeless tobacco. She reports current alcohol use of about 4.0 standard drinks of alcohol per week. She reports that she does not use drugs.    ROS Review of Systems  Constitutional:  Positive for activity change, appetite change, chills, fatigue and unexpected weight change.  HENT: Negative.    Respiratory:  Negative for cough, choking and chest tightness.   Cardiovascular:  Negative for chest pain, palpitations and leg swelling.  Gastrointestinal:  Positive for abdominal distention, abdominal pain, nausea and vomiting. Negative for anal bleeding, blood in stool, constipation and diarrhea.  Genitourinary:  Positive for flank pain. Negative for dysuria, frequency, hematuria and urgency.  Musculoskeletal:  Positive for back pain.    Objective:  BP (!) 217/90   Pulse 76   Temp 97.7 F (36.5 C)   Ht 5\' 9"  (1.753 m)   Wt 167 lb 9.6 oz (76 kg)   SpO2 99%   BMI 24.75 kg/m   BP Readings from Last 3 Encounters:  10/18/22 (!) 217/90  08/03/22 116/60  05/09/22 (!) 165/85    Wt Readings from Last 3 Encounters:  10/18/22 167 lb 9.6 oz (  76 kg)  08/03/22 186 lb 3.2 oz (84.5 kg)  07/21/22 183 lb (83 kg)     Physical Exam Constitutional:      General: She is in acute distress.     Appearance: She is well-developed. She is ill-appearing and toxic-appearing.  Cardiovascular:     Rate and Rhythm: Regular rhythm. Tachycardia present.  Pulmonary:     Breath sounds: No rhonchi or rales.     Comments: Shallow, tachypneic at 28 resp/min Abdominal:     General: Abdomen is flat. Bowel sounds are increased.     Palpations: Abdomen is soft.  There is no mass.     Tenderness: There is abdominal tenderness. There is right CVA tenderness, guarding (RUQ, R flank) and rebound. Positive signs include Murphy's sign and McBurney's sign.  Musculoskeletal:        General: Normal range of motion.  Skin:    General: Skin is warm and dry.  Neurological:     Mental Status: She is alert and oriented to person, place, and time.       Assessment & Plan:   Ellerie Arenz" was seen today for medical management of chronic issues, weight loss and back pain.  Diagnoses and all orders for this visit:  Acute abdomen -     Amylase -     Lipase -     Urinalysis  Essential hypertension, benign -     CBC with Differential/Platelet -     CMP14+EGFR  Mixed hyperlipidemia -     Lipid panel  Idiopathic peripheral neuropathy -     Tapentadol HCl 100 MG TABS; Take 1 tablet (100 mg total) by mouth 4 (four) times daily. -     Tapentadol HCl 100 MG TABS; Take 1 tablet (100 mg total) by mouth 4 (four) times daily. -     Tapentadol HCl 100 MG TABS; Take 1 tablet (100 mg total) by mouth 4 (four) times daily for 15 days.  Other orders -     metoprolol succinate (TOPROL-XL) 50 MG 24 hr tablet; Take with or immediately following a meal.    XR not available. Impossible to narrow down acute abd from data available. Possibly renal stone (no hematuria), retrocecal appendicitis, SBO or cholecystitis.   I have discontinued Ammie Warrick. [place] "Trish"'s estradiol, meloxicam, tiZANidine, and predniSONE. I am also having her maintain her prednisoLONE acetate, fenofibrate, potassium chloride, Repatha SureClick, metoprolol succinate, Tapentadol HCl, Tapentadol HCl, and Tapentadol HCl.  Allergies as of 10/18/2022       Reactions   Keflex [cephalexin]    GI, couldn't eat/drink, felt very sick   Statins Other (See Comments)   Myalgia, turns red   Zetia [ezetimibe] Other (See Comments)   Myopathy/redness   Penicillins Rash, Other (See Comments)   Pt not  sure if she is allergic to penicillin or sulfa. So she does not take either one.  DID THE REACTION INVOLVE: Swelling of the face/tongue/throat, SOB, or low BP? Unknown Sudden or severe rash/hives, skin peeling, or the inside of the mouth or nose? No Did it require medical treatment? No When did it last happen?      childhood allergy If all above answers are "NO", may proceed with cephalosporin use.   Sulfa Antibiotics Rash, Other (See Comments)   Pt not sure if she is allergic to sulfa or penicillin so she doesn't take either one.         Medication List        Accurate as of  October 18, 2022  9:22 AM. If you have any questions, ask your nurse or doctor.          STOP taking these medications    estradiol 0.1 MG/GM vaginal cream Commonly known as: ESTRACE Stopped by: Guyla Bless   meloxicam 15 MG tablet Commonly known as: MOBIC Stopped by: Kade Demicco   predniSONE 10 MG tablet Commonly known as: DELTASONE Stopped by: Shaylynn Nulty   tiZANidine 4 MG tablet Commonly known as: ZANAFLEX Stopped by: Dallas Torok       TAKE these medications    fenofibrate 160 MG tablet Take 1 tablet (160 mg total) by mouth daily.   metoprolol succinate 50 MG 24 hr tablet Commonly known as: TOPROL-XL Take with or immediately following a meal.   potassium chloride 10 MEQ tablet Commonly known as: KLOR-CON TAKE 2 TO 3 TABLETS BY MOUTH DAILY AS DIRECTED   prednisoLONE acetate 1 % ophthalmic suspension Commonly known as: PRED FORTE Place 1 drop into the right eye 4 (four) times daily.   Repatha SureClick 140 MG/ML [place] Generic drug: Evolocumab Inject 140 mg into the skin every 14 (fourteen) days.   Tapentadol HCl 100 MG Tabs Take 1 tablet (100 mg total) by mouth 4 (four) times daily. What changed: Another medication with the same name was changed. Make sure you understand how and when to take each. Changed by: Kimberla Driskill   Tapentadol HCl 100 MG Tabs Take 1 tablet (100  mg total) by mouth 4 (four) times daily. Start taking on: November 17, 2022 What changed: These instructions start on November 17, 2022. If you are unsure what to do until then, ask your doctor or other care provider. Changed by: Charnetta Wulff   Tapentadol HCl 100 MG Tabs Take 1 tablet (100 mg total) by mouth 4 (four) times daily for 15 days. Start taking on: December 16, 2022 What changed: These instructions start on December 16, 2022. If you are unsure what to do until then, ask your doctor or other care provider. Changed by: Kei Langhorst       Pt. Sent by [facility] to [facility] due to acute abd pain.  Follow-up: Return after hospital DC.  Mechele Claude, M.D.

## 2022-10-18 NOTE — ED Triage Notes (Signed)
Pt reports right lower side back pain that radiates around the right flank. Pt also reports n/v. Pt denies urinary symptoms.

## 2022-10-18 NOTE — ED Provider Notes (Signed)
[place] EMERGENCY DEPARTMENT AT [facility] Provider Note   CSN: [id number] Arrival date & time: 10/18/22  7829     History  Chief Complaint  Patient presents with   Back Pain    Brittany Mercer is a 77 y.o. female.   Back Pain    Patient has a history of hypertension hyperlipidemia, neuropathy, headaches, chronic narcotic use.  Patient states over the last couple of months she has been having trouble with unintended weight loss, difficulty eating or drinking.  Patient states over the last couple months she probably is only been able to have 7 meals.  She states she gets nauseated and feels like something will come up.  Patient states she has not vomited much because nothing is there in her stomach.  She has had decreased appetite.  Patient states she lost 25 pounds.  Patient denies any fevers.    She denies any diarrhea.  She has been having pain also in her back and upper abdomen.  She has not been able to sleep well because of this.  Patient states she called her doctor about her back discomfort last month.  Patient states none of those things have helped.  She went to the doctor's office today indicating that she was having severe back pain and food was coming up.  Home Medications Prior to Admission medications   Medication Sig Start Date End Date Taking? Authorizing Provider  amLODipine (NORVASC) 5 MG tablet Take 1 tablet (5 mg total) by mouth daily. 10/18/22   Linwood Dibbles, MD  Evolocumab (REPATHA SURECLICK) 140 MG/ML SOAJ Inject 140 mg into the skin every 14 (fourteen) days. 09/19/22   Mechele Claude, MD  fenofibrate 160 MG tablet Take 1 tablet (160 mg total) by mouth daily. Patient not taking: Reported on 10/18/2022 01/25/22   Mechele Claude, MD  metoprolol succinate (TOPROL-XL) 50 MG 24 hr tablet Take with or immediately following a meal. 10/18/22   Mechele Claude, MD  ondansetron (ZOFRAN-ODT) 8 MG disintegrating tablet Take 1 tablet (8 mg total) by mouth every  8 (eight) hours as needed for nausea or vomiting. 10/18/22   Linwood Dibbles, MD  pantoprazole (PROTONIX) 40 MG tablet Take 1 tablet (40 mg total) by mouth daily. 10/18/22   Linwood Dibbles, MD  potassium chloride (KLOR-CON) 10 MEQ tablet TAKE 2 TO 3 TABLETS BY MOUTH DAILY AS DIRECTED 01/25/22   Mechele Claude, MD  prednisoLONE acetate (PRED FORTE) 1 % ophthalmic suspension Place 1 drop into the right eye 4 (four) times daily. 05/30/19   [provider]  Tapentadol HCl 100 MG TABS Take 1 tablet (100 mg total) by mouth 4 (four) times daily. 10/18/22 11/17/22  Mechele Claude, MD  Tapentadol HCl 100 MG TABS Take 1 tablet (100 mg total) by mouth 4 (four) times daily. 11/17/22 12/17/22  Mechele Claude, MD  Tapentadol HCl 100 MG TABS Take 1 tablet (100 mg total) by mouth 4 (four) times daily for 15 days. 12/16/22 12/31/22  Mechele Claude, MD      Allergies    Keflex [cephalexin], Statins, Zetia [ezetimibe], Penicillins, and Sulfa antibiotics    Review of Systems   Review of Systems  Musculoskeletal:  Positive for back pain.    Physical Exam Updated Vital Signs BP (!) 190/91   Pulse 66   Temp (!) 97.4 F (36.3 C) (Oral)   Resp 17   SpO2 98%  Physical Exam Vitals and nursing note reviewed.  Constitutional:      Appearance: She  is well-developed. She is not diaphoretic.  HENT:     Head: Normocephalic and atraumatic.     Right Ear: External ear normal.     Left Ear: External ear normal.  Eyes:     General: No scleral icterus.       Right eye: No discharge.        Left eye: No discharge.     Conjunctiva/sclera: Conjunctivae normal.  Neck:     Trachea: No tracheal deviation.  Cardiovascular:     Rate and Rhythm: Normal rate and regular rhythm.  Pulmonary:     Effort: Pulmonary effort is normal. No respiratory distress.     Breath sounds: Normal breath sounds. No stridor. No wheezing or rales.  Abdominal:     General: Bowel sounds are normal. There is no distension.     Palpations: Abdomen is  soft.     Tenderness: There is abdominal tenderness in the right upper quadrant. There is no guarding or rebound.  Musculoskeletal:        General: No tenderness or deformity.     Cervical back: Neck supple.  Skin:    General: Skin is warm and dry.     Findings: No rash.  Neurological:     General: No focal deficit present.     Mental Status: She is alert.     Cranial Nerves: No cranial nerve deficit, dysarthria or facial asymmetry.     Sensory: No sensory deficit.     Motor: No abnormal muscle tone or seizure activity.     Coordination: Coordination normal.  Psychiatric:        Mood and Affect: Mood normal.     ED Results / Procedures / Treatments   Labs (all labs ordered are listed, but only abnormal results are displayed) Labs Reviewed  LIPASE, BLOOD - Abnormal; Notable for the following components:      Result Value   Lipase 57 (*)    All other components within normal limits  COMPREHENSIVE METABOLIC PANEL - Abnormal; Notable for the following components:   BUN 24 (*)    Total Bilirubin 1.4 (*)    All other components within normal limits  CBC - Abnormal; Notable for the following components:   Hemoglobin 15.5 (*)    All other components within normal limits  URINALYSIS, ROUTINE W REFLEX MICROSCOPIC    EKG EKG Interpretation Date/Time:  Wednesday October 18 2022 11:33:01 EDT Ventricular Rate:  68 PR Interval:  148 QRS Duration:  87 QT Interval:  422 QTC Calculation: 449 R Axis:   -35  Text Interpretation: Sinus rhythm Left atrial enlargement Abnormal R-wave progression, late transition Left ventricular hypertrophy No significant change since last tracing Confirmed by Linwood Dibbles [phone]) on 10/18/2022 11:43:59 AM  Radiology CT Angio Chest/Abd/Pel for Dissection W and/or Wo Contrast  Result Date: 10/18/2022 CLINICAL DATA:  Right-sided back pain radiating to right flank, acute aortic syndrome suspected EXAM: CT ANGIOGRAPHY CHEST, ABDOMEN AND PELVIS TECHNIQUE:  Non-contrast CT of the chest was initially obtained. Multidetector CT imaging through the chest, abdomen and pelvis was performed using the standard protocol during bolus administration of intravenous contrast. Multiplanar reconstructed images and MIPs were obtained and reviewed to evaluate the vascular anatomy. RADIATION DOSE REDUCTION: This exam was performed according to the departmental dose-optimization program which includes automated exposure control, adjustment of the mA and/or kV according to patient size and/or use of iterative reconstruction technique. CONTRAST:  OMNIPAQUE IOHEXOL 350 MG/ML SOLN COMPARISON:  None Available. FINDINGS: CTA  CHEST FINDINGS Cardiovascular: Normal heart size. No pericardial effusion. No visible coronary artery calcifications. No definite filling defects of the pulmonary arteries, although bolus timing limits evaluation. Mediastinum/Nodes: Small hiatal hernia. Thyroid is unremarkable. No enlarged lymph nodes seen in the chest. Lungs/Pleura: Central airways are patent. No consolidation, pleural effusion or pneumothorax. Scattered lower lung predominant linear opacities which are likely due to scarring or atelectasis. Musculoskeletal: Tiny focal sclerotic lesions of the sternum and T7 vertebral body, likely bone island. No aggressive appearing osseous lesions. Review of the MIP images confirms the above findings. CTA ABDOMEN AND PELVIS FINDINGS VASCULAR Aorta: Normal caliber thoracic and abdominal aorta with mild atherosclerotic disease. Standard three-vessel aortic arch with no significant stenosis. No evidence of acute aortic syndrome. Celiac: Patent without evidence of aneurysm, dissection, vasculitis or significant stenosis. SMA: Patent without evidence of aneurysm, dissection, vasculitis or significant stenosis. Renals: Both renal arteries are patent without evidence of aneurysm, dissection, vasculitis, fibromuscular dysplasia or significant stenosis. IMA: Patent  without evidence of aneurysm, dissection, vasculitis or significant stenosis. Inflow: Patent without evidence of aneurysm, dissection, vasculitis or significant stenosis. Veins: No obvious venous abnormality within the limitations of this arterial phase study. Review of the MIP images confirms the above findings. NON-VASCULAR Hepatobiliary: No focal liver abnormality is seen. No gallstones, gallbladder wall thickening, or biliary dilatation. Pancreas: Unremarkable. No pancreatic ductal dilatation or surrounding inflammatory changes. Spleen: Normal in size without focal abnormality. Adrenals/Urinary Tract: Bilateral adrenal glands are unremarkable. No hydronephrosis or nephrolithiasis. Bladder is unremarkable. Stomach/Bowel: Stomach is within normal limits. Appendix appears normal. Mild diverticulosis. No evidence of bowel wall thickening, distention, or inflammatory changes. Lymphatic: No enlarged lymph nodes seen in the abdomen or pelvis. Reproductive: No adnexal mass. Other: No abdominal wall hernia or abnormality. No abdominopelvic ascites. Musculoskeletal: Moderate degenerative disc disease, most pronounced at T12-L3. No aggressive appearing osseous lesions. Dextrocurvature of the lumbar spine. Review of the MIP images confirms the above findings. IMPRESSION: 1. No evidence of acute aortic syndrome. 2. No acute abnormality of the chest, abdomen or pelvis. Electronically Signed   By: Allegra Lai M.D.   On: 10/18/2022 11:44    Procedures Procedures    Medications Ordered in ED Medications  morphine (PF) 4 MG/ML injection 4 mg (4 mg Intravenous Given 10/18/22 1028)  ondansetron (ZOFRAN) injection 4 mg (4 mg Intravenous Given 10/18/22 1029)  iohexol (OMNIPAQUE) 350 MG/ML injection 100 mL (100 mLs Intravenous Contrast Given 10/18/22 1107)  amLODipine (NORVASC) tablet 5 mg (5 mg Oral Given 10/18/22 1240)    ED Course/ Medical Decision Making/ A&P Clinical Course as of 10/18/22 1240  Wed Oct 18, 2022   1158 CT scan does not show any evidence of acute aortic syndrome.  No abnormality of the chest abdomen or pelvis [JK]  1201 CBC normal.  Lipase slightly elevated at 57.  Metabolic panel normal [JK]    Clinical Course User Index [JK] Linwood Dibbles, MD                             Medical Decision Making Problems Addressed: Abdominal pain, unspecified abdominal location: chronic illness or injury with exacerbation, progression, or side effects of treatment Hypertension, unspecified type: acute illness or injury Nausea and vomiting, unspecified vomiting type: chronic illness or injury with exacerbation, progression, or side effects of treatment  Amount and/or Complexity of Data Reviewed Labs: ordered. Decision-making details documented in ED Course. Radiology: ordered and independent interpretation performed.  Risk Prescription drug  management.   Patient presented to the ED for evaluation of nausea vomiting weight loss abdominal pain back pain.  Patient states her symptoms have been going on for couple of months now.  Patient went to her primary care doctor's office today.  They were concerned for acute abdomen considering her symptoms.  Patient's ED workup is reassuring.  She has no leukocytosis.  No signs of any renal dysfunction.  No signs of UTI.  Slight elevation in the bilirubin and lipase.  CT scan of her chest abdomen pelvis was performed and there is no evidence of any mass esophageal abnormality or obstruction.  Patient also does not have any acute process noted on her abdominal pelvic portion of the CT scan.  Etiology of her symptoms unclear however there does not appear to be any acute emergency condition noted.  No signs of pancreatitis hepatitis obstruction.  Will have patient start taking antacids.  Recommend outpatient follow-up with GI.  With her elevated blood pressure we will have her start Norvasc.  Recommend close follow-up with her PCP.        Final Clinical  Impression(s) / ED Diagnoses Final diagnoses:  Abdominal pain, unspecified abdominal location  Hypertension, unspecified type  Nausea and vomiting, unspecified vomiting type    Rx / DC Orders ED Discharge Orders          Ordered    pantoprazole (PROTONIX) 40 MG tablet  Daily,   Status:  Discontinued        10/18/22 1236    ondansetron (ZOFRAN-ODT) 8 MG disintegrating tablet  Every 8 hours PRN,   Status:  Discontinued        10/18/22 1236    amLODipine (NORVASC) 5 MG tablet  Daily,   Status:  Discontinued        10/18/22 1236    amLODipine (NORVASC) 5 MG tablet  Daily        10/18/22 1237    ondansetron (ZOFRAN-ODT) 8 MG disintegrating tablet  Every 8 hours PRN        10/18/22 1237    pantoprazole (PROTONIX) 40 MG tablet  Daily        10/18/22 1237              Linwood Dibbles, MD 10/18/22 1240

## 2022-10-19 ENCOUNTER — Telehealth: Payer: Self-pay | Admitting: *Deleted

## 2022-10-19 ENCOUNTER — Other Ambulatory Visit: Payer: Self-pay | Admitting: Family Medicine

## 2022-10-19 MED ORDER — OXYCODONE-ACETAMINOPHEN 5-325 MG PO TABS: ORAL_TABLET | ORAL | 0 refills | Status: DC

## 2022-10-19 NOTE — Telephone Encounter (Signed)
Per Dr Darlyn Read request, I called patient to see how she is feeling. She states she is okay accept for the back pain that is preventing her from sleeping. I advised that Dr Darlyn Read will be trying to get her seen by GI asap and asked if she is willing to travel to [facility] if able to get in sooner there and she declines. She also states she needs to get her back pain addressed before going to GI.

## 2022-10-24 ENCOUNTER — Telehealth: Payer: Self-pay

## 2022-10-24 NOTE — Telephone Encounter (Signed)
Transition Care Management Unsuccessful Follow-up Telephone Call  Date of discharge and from where:  10/18/2022 [facility]  Attempts:  1st Attempt  Reason for unsuccessful TCM follow-up call:  Unable to leave message  Eliannah Hinde [facility]  [facility] Population Health Community Resource Care Guide   [email or web address]  ?? [phone]   Website: [email or web address]  [email or web address]

## 2022-10-25 ENCOUNTER — Ambulatory Visit (INDEPENDENT_AMBULATORY_CARE_PROVIDER_SITE_OTHER): Payer: Medicare Other | Admitting: Family Medicine

## 2022-10-25 ENCOUNTER — Encounter: Payer: Self-pay | Admitting: Family Medicine

## 2022-10-25 ENCOUNTER — Telehealth: Payer: Self-pay

## 2022-10-25 VITALS — BP 147/64 | HR 75 | Temp 97.6°F | Wt 171.2 lb

## 2022-10-25 DIAGNOSIS — R109 Unspecified abdominal pain: Secondary | ICD-10-CM | POA: Diagnosis not present

## 2022-10-25 MED ORDER — OXYCODONE-ACETAMINOPHEN 5-325 MG PO TABS: ORAL_TABLET | ORAL | 0 refills | Status: DC

## 2022-10-25 MED ORDER — BETAMETHASONE SOD PHOS & ACET 6 (3-3) MG/ML IJ SUSP
6.0000 mg | Freq: Once | INTRAMUSCULAR | Status: AC
Start: 2022-10-25 — End: 2022-10-25
  Administered 2022-10-25: 6 mg via INTRAMUSCULAR

## 2022-10-25 MED ORDER — LORAZEPAM 0.5 MG PO TABS: 0.5000 mg | ORAL_TABLET | Freq: Every day | ORAL | 0 refills | Status: DC

## 2022-10-25 NOTE — Progress Notes (Signed)
Subjective:  Patient ID: Brittany Mercer, female    DOB: May 01, 1945  Age: 77 y.o. MRN: [id number]  CC: Follow-up   HPI Brittany Mercer presents for 8/10 pain at right flank posteriorly at T12-L2. The abdominal component started clearing almost immediately after starting to take PPI. However, she cannot get relief from the right flank pain.      10/25/2022    2:51 PM 10/18/2022    8:00 AM 10/18/2022    7:51 AM  Depression screen PHQ 2/9  Decreased Interest 0 0 0  Down, Depressed, Hopeless 0 1 0  PHQ - 2 Score 0 1 0  Altered sleeping  3   Tired, decreased energy  3   Change in appetite  3   Feeling bad or failure about yourself   0   Trouble concentrating  0   Moving slowly or fidgety/restless  0   Suicidal thoughts  0   PHQ-9 Score  10   Difficult doing work/chores  Somewhat difficult     History Brittany Mercer has a past medical history of Cataract, Chronic narcotic use, Headache, Hereditary and idiopathic peripheral neuropathy, Hyperlipidemia, Hypertension, Hypokalemia, OA (osteoarthritis), Peripheral neuropathy, and Wears glasses.   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   Her family history includes Arthritis in her brother; Dementia in her father; Heart disease in her mother; Hyperlipidemia in her mother; Hypertension in her brother, father, and mother; Neuropathy in her father; Stroke in her mother.She reports that she quit smoking about 24 years ago. Her smoking use included cigarettes. She started smoking about 54 years ago. She has a 15 pack-year smoking  history. She has never used smokeless tobacco. She reports current alcohol use of about 4.0 standard drinks of alcohol per week. She reports that she does not use drugs.    ROS Review of Systems  Constitutional: Negative.   HENT: Negative.    Eyes:  Negative for visual disturbance.  Respiratory:  Negative for shortness of breath.   Cardiovascular:  Negative for chest pain.  Gastrointestinal:  Negative for abdominal pain.  Musculoskeletal:  Positive for back pain. Negative for arthralgias.    Objective:  BP (!) 147/64   Pulse 75   Temp 97.6 F (36.4 C)   Wt 171 lb 3.2 oz (77.7 kg)   SpO2 97%   BMI 25.28 kg/m   BP Readings from Last 3 Encounters:  10/25/22 (!) 147/64  10/18/22 (!) 190/91  10/18/22 (!) 217/90    Wt Readings from Last 3 Encounters:  10/25/22 171 lb 3.2 oz (77.7 kg)  10/18/22 167 lb 9.6 oz (76 kg)  08/03/22 186 lb 3.2 oz (84.5 kg)     Physical Exam Constitutional:      General: She is not in acute distress.    Appearance: She is well-developed.  Cardiovascular:     Rate and Rhythm: Normal rate and regular rhythm.  Pulmonary:     Breath sounds: Normal breath sounds.  Musculoskeletal:        General: Tenderness (lateral back below the right angle of the scapula, behind the costal margin is a 5 cm area of spasmed musculature.) present. Normal range of motion.  Skin:    General: Skin is warm and dry.  Neurological:     Mental Status: She is alert and oriented to person, place, and time.       Assessment & Plan:   Nadean Sapir" was seen today for follow-up.  Diagnoses and all orders for this visit:  Flank pain -     DG Lumbar Spine 2-3 Views; Future -     DG Thoracic Spine 2 View; Future -     betamethasone acetate-betamethasone sodium phosphate (CELESTONE) injection 6 mg -     CMP14+EGFR -     CBC with Differential/Platelet -     Lipase -     Amylase  Other orders -     LORazepam (ATIVAN) 0.5 MG tablet; Take 1 tablet (0.5 mg total)  by mouth at bedtime. For sleep -     oxyCODONE-acetaminophen (PERCOCET/ROXICET) 5-325 MG tablet; 1 qid prn severe breakthrough pain       I am having [name]. [employer]" start on LORazepam. I am also having her maintain her prednisoLONE acetate, fenofibrate, potassium chloride, Repatha SureClick, metoprolol succinate, Tapentadol HCl, Tapentadol HCl, Tapentadol HCl, amLODipine, ondansetron, pantoprazole, and oxyCODONE-acetaminophen. We administered betamethasone acetate-betamethasone sodium phosphate.  Allergies as of 10/25/2022       Reactions   Keflex [cephalexin]    GI, couldn't eat/drink, felt very sick   Statins Other (See Comments)   Myalgia, turns red   Zetia [ezetimibe] Other (See Comments)   Myopathy/redness   Penicillins Rash, Other (See Comments)   Pt not sure if she is allergic to penicillin or sulfa. So she does not take either one.  DID THE REACTION INVOLVE: Swelling of the face/tongue/throat, SOB, or low BP? Unknown Sudden or severe rash/hives, skin peeling, or the inside of the mouth or nose? No Did it require medical treatment? No When did it last happen?      childhood allergy If all above answers are "NO", may proceed with cephalosporin use.   Sulfa Antibiotics Rash, Other (See Comments)   Pt not sure if she is allergic to sulfa or penicillin so she doesn't take either one.         Medication List        Accurate as of October 25, 2022  6:29 PM. If you have any questions, ask your nurse or doctor.          amLODipine 5 MG tablet Commonly known as: NORVASC Take 1 tablet (5 mg total) by mouth daily.   fenofibrate 160 MG tablet Take 1 tablet (160 mg total) by mouth daily.   LORazepam 0.5 MG tablet Commonly known as: ATIVAN Take 1 tablet (0.5 mg total) by mouth at bedtime. For sleep Started by: Kristofer Schaffert   metoprolol succinate 50 MG 24 hr tablet Commonly known as: TOPROL-XL Take with or immediately following a meal.   ondansetron 8 MG  disintegrating tablet Commonly known as: ZOFRAN-ODT Take 1 tablet (8 mg total) by mouth every 8 (eight) hours as needed for nausea or vomiting.   oxyCODONE-acetaminophen 5-325 MG tablet Commonly known as: PERCOCET/ROXICET 1 qid prn severe breakthrough pain   pantoprazole 40 MG tablet Commonly known as: Protonix Take 1 tablet (40 mg total) by mouth daily.   potassium chloride 10 MEQ tablet Commonly known as: KLOR-CON TAKE 2 TO 3 TABLETS BY MOUTH DAILY AS DIRECTED   prednisoLONE acetate 1 % ophthalmic suspension Commonly known as: PRED FORTE Place 1 drop into the right eye 4 (four) times daily.  Repatha SureClick 140 MG/ML Soaj Generic drug: Evolocumab Inject 140 mg into the skin every 14 (fourteen) days.   Tapentadol HCl 100 MG Tabs Take 1 tablet (100 mg total) by mouth 4 (four) times daily.   Tapentadol HCl 100 MG Tabs Take 1 tablet (100 mg total) by mouth 4 (four) times daily. Start taking on: November 17, 2022   Tapentadol HCl 100 MG Tabs Take 1 tablet (100 mg total) by mouth 4 (four) times daily for 15 days. Start taking on: December 16, 2022         Follow-up: Return in about 2 weeks (around 11/08/2022) for Pain.  Mechele Claude, M.D.

## 2022-10-25 NOTE — Telephone Encounter (Signed)
Transition Care Management Follow-up Telephone Call Date of discharge and from where: 10/18/2022 [facility] How have you been since you were released from the hospital? Patient has regained her appetite but is still experiencing chronic back pain. Any questions or concerns? No  Items Reviewed: Did the pt receive and understand the discharge instructions provided? Yes  Medications obtained and verified? Yes  Other? No  Any new allergies since your discharge? No  Dietary orders reviewed? Yes Do you have support at home? Yes   Follow up appointments reviewed:  PCP Hospital f/u appt confirmed? Yes  Scheduled to see Mechele Claude, MD on 10/25/2022 @ [facility]. Specialist Hospital f/u appt confirmed? No  Scheduled to see  on  @ . Are transportation arrangements needed? No  If their condition worsens, is the pt aware to call PCP or go to the Emergency Dept.? Yes Was the patient provided with contact information for the PCP's office or ED? Yes Was to pt encouraged to call back with questions or concerns? Yes  Kooper Godshall [facility]  [facility] Population Health Community Resource Care Guide   [email or web address]  ?? [phone]   Website: [email or web address]  [email or web address]

## 2022-11-06 ENCOUNTER — Encounter: Payer: Self-pay | Admitting: Family Medicine

## 2022-11-09 ENCOUNTER — Ambulatory Visit (INDEPENDENT_AMBULATORY_CARE_PROVIDER_SITE_OTHER): Payer: Medicare Other

## 2022-11-09 ENCOUNTER — Ambulatory Visit: Payer: Medicare Other | Admitting: Family Medicine

## 2022-11-09 ENCOUNTER — Encounter: Payer: Self-pay | Admitting: Family Medicine

## 2022-11-09 VITALS — BP 152/82 | HR 80 | Temp 97.5°F | Ht 69.0 in | Wt 161.4 lb

## 2022-11-09 DIAGNOSIS — M47816 Spondylosis without myelopathy or radiculopathy, lumbar region: Secondary | ICD-10-CM | POA: Diagnosis not present

## 2022-11-09 DIAGNOSIS — R109 Unspecified abdominal pain: Secondary | ICD-10-CM

## 2022-11-09 DIAGNOSIS — M4185 Other forms of scoliosis, thoracolumbar region: Secondary | ICD-10-CM | POA: Diagnosis not present

## 2022-11-09 DIAGNOSIS — M545 Low back pain, unspecified: Secondary | ICD-10-CM | POA: Diagnosis not present

## 2022-11-09 DIAGNOSIS — R1011 Right upper quadrant pain: Secondary | ICD-10-CM | POA: Diagnosis not present

## 2022-11-09 DIAGNOSIS — M438X5 Other specified deforming dorsopathies, thoracolumbar region: Secondary | ICD-10-CM | POA: Diagnosis not present

## 2022-11-09 DIAGNOSIS — M549 Dorsalgia, unspecified: Secondary | ICD-10-CM | POA: Diagnosis not present

## 2022-11-09 DIAGNOSIS — M47814 Spondylosis without myelopathy or radiculopathy, thoracic region: Secondary | ICD-10-CM | POA: Diagnosis not present

## 2022-11-09 MED ORDER — OXYCODONE-ACETAMINOPHEN 5-325 MG PO TABS: ORAL_TABLET | ORAL | 0 refills | Status: DC

## 2022-11-09 NOTE — Progress Notes (Signed)
Subjective:  Patient ID: Brittany Mercer, female    DOB: 1945/11/11  Age: 77 y.o. MRN: [id number]  CC: Follow-up and Flank Pain   HPI Brittany Mercer presents for stomach still hurts when she eats. Now eating better. Pain level down to 5/10 for the last two days. Radiates from RUQ to R flank.Pain at left flank rare, but severe, twisting feeling. No diarrhea or constipation. Took ondansetron for two days. Minimal nausea since then. Having mid back pain as well. Moderately severe.      11/09/2022   10:59 AM 11/09/2022   10:50 AM 10/25/2022    2:51 PM  Depression screen PHQ 2/9  Decreased Interest 1 0 0  Down, Depressed, Hopeless 0 0 0  PHQ - 2 Score 1 0 0  Altered sleeping 1    Tired, decreased energy 3    Change in appetite 3    Feeling bad or failure about yourself  0    Trouble concentrating 3    Moving slowly or fidgety/restless 0    Suicidal thoughts 0    PHQ-9 Score 11    Difficult doing work/chores Somewhat difficult      History Brittany Mercer has a past medical history of Cataract, Chronic narcotic use, Headache, Hereditary and idiopathic peripheral neuropathy, Hyperlipidemia, Hypertension, Hypokalemia, OA (osteoarthritis), Peripheral neuropathy, and Wears glasses.   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   Her family history includes Arthritis in her brother; Dementia in her father; Heart disease in her mother; Hyperlipidemia in her mother; Hypertension in her brother, father, and mother; Neuropathy in her father; Stroke in her mother.She reports that she quit  smoking about 24 years ago. Her smoking use included cigarettes. She started smoking about 54 years ago. She has a 15 pack-year smoking history. She has never used smokeless tobacco. She reports current alcohol use of about 4.0 standard drinks of alcohol per week. She reports that she does not use drugs.    ROS Review of Systems  Constitutional: Negative.   HENT: Negative.    Eyes:  Negative for visual disturbance.  Respiratory:  Negative for shortness of breath.   Cardiovascular:  Negative for chest pain.  Gastrointestinal:  Positive for abdominal pain.  Musculoskeletal:  Positive for arthralgias and back pain.  Neurological:  Positive for dizziness.    Objective:  BP (!) 152/82   Pulse 80   Temp (!) 97.5 F (36.4 C)   Ht 5\' 9"  (1.753 m)   Wt 161 lb 6.4 oz (73.2 kg)   SpO2 100%   BMI 23.83 kg/m   BP Readings from Last 3 Encounters:  11/09/22 (!) 152/82  10/25/22 (!) 147/64  10/18/22 (!) 190/91    Wt Readings from Last 3 Encounters:  11/09/22 161 lb 6.4 oz (73.2 kg)  10/25/22 171 lb 3.2 oz (77.7 kg)  10/18/22 167 lb 9.6 oz (76 kg)     Physical Exam Constitutional:      General: She is not in acute distress.    Appearance: Normal appearance. She is well-developed.  Cardiovascular:     Rate and Rhythm: Normal rate and regular rhythm.  Pulmonary:     Breath sounds: Normal  breath sounds.  Abdominal:     Tenderness: There is abdominal tenderness (periumbilical to RUQ).  Musculoskeletal:        General: Normal range of motion.  Skin:    General: Skin is warm and dry.  Neurological:     Mental Status: She is alert and oriented to person, place, and time.  Psychiatric:        Mood and Affect: Mood normal.    XR - scoliosis noted. Dextroscoliosis Apex level L2 with arthritic changes   Assessment & Plan:   Brittany Mercer" was seen today for follow-up and flank pain.  Diagnoses and all orders for this visit:  RUQ pain -     Ambulatory referral to  Gastroenterology  Mid back pain -     Ambulatory referral to Orthopedics  Dextroscoliosis of thoracolumbar spine -     Ambulatory referral to Orthopedics  Other orders -     oxyCODONE-acetaminophen (PERCOCET/ROXICET) 5-325 MG tablet; 1 qid prn severe breakthrough pain       I am having Brittany Mercer. [employer]" maintain her prednisoLONE acetate, fenofibrate, potassium chloride, Repatha SureClick, metoprolol succinate, Tapentadol HCl, Tapentadol HCl, Tapentadol HCl, amLODipine, ondansetron, pantoprazole, LORazepam, and oxyCODONE-acetaminophen.  Allergies as of 11/09/2022       Reactions   Keflex [cephalexin]    GI, couldn't eat/drink, felt very sick   Statins Other (See Comments)   Myalgia, turns red   Zetia [ezetimibe] Other (See Comments)   Myopathy/redness   Penicillins Rash, Other (See Comments)   Pt not sure if she is allergic to penicillin or sulfa. So she does not take either one.  DID THE REACTION INVOLVE: Swelling of the face/tongue/throat, SOB, or low BP? Unknown Sudden or severe rash/hives, skin peeling, or the inside of the mouth or nose? No Did it require medical treatment? No When did it last happen?      childhood allergy If all above answers are "NO", may proceed with cephalosporin use.   Sulfa Antibiotics Rash, Other (See Comments)   Pt not sure if she is allergic to sulfa or penicillin so she doesn't take either one.         Medication List        Accurate as of November 09, 2022 11:59 PM. If you have any questions, ask your nurse or doctor.          amLODipine 5 MG tablet Commonly known as: NORVASC Take 1 tablet (5 mg total) by mouth daily.   fenofibrate 160 MG tablet Take 1 tablet (160 mg total) by mouth daily.   LORazepam 0.5 MG tablet Commonly known as: ATIVAN Take 1 tablet (0.5 mg total) by mouth at bedtime. For sleep   metoprolol succinate 50 MG 24 hr tablet Commonly known as: TOPROL-XL Take with or immediately following a meal.    ondansetron 8 MG disintegrating tablet Commonly known as: ZOFRAN-ODT Take 1 tablet (8 mg total) by mouth every 8 (eight) hours as needed for nausea or vomiting.   oxyCODONE-acetaminophen 5-325 MG tablet Commonly known as: PERCOCET/ROXICET 1 qid prn severe breakthrough pain   pantoprazole 40 MG tablet Commonly known as: Protonix Take 1 tablet (40 mg total) by mouth daily.   potassium chloride 10 MEQ tablet Commonly known as: KLOR-CON TAKE 2 TO 3 TABLETS BY MOUTH DAILY AS DIRECTED   prednisoLONE acetate 1 % ophthalmic suspension Commonly known as: PRED FORTE Place 1 drop into the right eye 4 (four) times daily.   Repatha SureClick 140 MG/ML Soaj  Generic drug: Evolocumab Inject 140 mg into the skin every 14 (fourteen) days.   Tapentadol HCl 100 MG Tabs Take 1 tablet (100 mg total) by mouth 4 (four) times daily.   Tapentadol HCl 100 MG Tabs Take 1 tablet (100 mg total) by mouth 4 (four) times daily. Start taking on: November 17, 2022   Tapentadol HCl 100 MG Tabs Take 1 tablet (100 mg total) by mouth 4 (four) times daily for 15 days. Start taking on: December 16, 2022         Follow-up: Return in about 2 weeks (around 11/23/2022).  Mechele Claude, M.D.

## 2022-11-10 ENCOUNTER — Encounter: Payer: Self-pay | Admitting: Family Medicine

## 2022-11-10 LAB — CBC WITH DIFFERENTIAL/PLATELET: Monocytes Absolute: 0.8 10*3/uL (ref 0.1–0.9)

## 2022-11-11 ENCOUNTER — Other Ambulatory Visit: Payer: Self-pay | Admitting: Family Medicine

## 2022-11-11 DIAGNOSIS — K861 Other chronic pancreatitis: Secondary | ICD-10-CM

## 2022-11-13 ENCOUNTER — Ambulatory Visit (INDEPENDENT_AMBULATORY_CARE_PROVIDER_SITE_OTHER): Payer: Self-pay | Admitting: Plastic Surgery

## 2022-11-13 ENCOUNTER — Encounter: Payer: Self-pay | Admitting: Plastic Surgery

## 2022-11-13 DIAGNOSIS — Z719 Counseling, unspecified: Secondary | ICD-10-CM

## 2022-11-13 NOTE — Progress Notes (Signed)

## 2022-11-15 DIAGNOSIS — Z1283 Encounter for screening for malignant neoplasm of skin: Secondary | ICD-10-CM | POA: Diagnosis not present

## 2022-11-15 DIAGNOSIS — L570 Actinic keratosis: Secondary | ICD-10-CM | POA: Diagnosis not present

## 2022-11-15 DIAGNOSIS — Z85828 Personal history of other malignant neoplasm of skin: Secondary | ICD-10-CM | POA: Diagnosis not present

## 2022-11-15 DIAGNOSIS — D485 Neoplasm of uncertain behavior of skin: Secondary | ICD-10-CM | POA: Diagnosis not present

## 2022-11-15 NOTE — Progress Notes (Signed)
Brittany Mercer    [id number]    07-29-1945  Primary Care Physician:Stacks, Broadus John, MD  Referring Physician: Mechele Claude, MD [address] [place],  [place] [address]   Chief complaint:  Chief Complaint  Patient presents with   Weight Loss    20 to 30 lbs over 2 weeks   Nausea    resolved   Anorexia    Appetite is improving   Fatigue    No energy   Colonoscopy    Last one 10 yrs ago, in [place], [place]    HPI: Brittany Mercer is a 77 y.o.  presenting to clinic today for consult for abdominal pain and weight loss. Seen in ED 10/18/22 for nausea, vomiting, weight loss, abdominal pain, back pain.   Accompanied by her friend.   Today, we reviewed her latest weight loss episode that occurred in May. She has had significant weight loss of 25 pounds since. She believes weight loss is due to nausea in association with abdominal pain. She denies any loss or changes in taste or smell. Currently, she is feeling better regarding recent episode of nausea, abdominal pain. She also complains of severe back pain that started around the same time as her GI complains.   She also complains of vomiting accompanied by nausea She is regaining her appetite but still has occasional nausea when eating large meals and states that she's unable to continue eating despite being hungry. Her episode lasted for one week. Vomiting has resolved since but she continues to experience her symptoms. Denies similar episodes in the past. She reports experiencing a "butterfly sensation" shortly after eating but would resolve in about 30 mins or so.  She is eating 2-3 meals per day. Taking Protonix 40 mg daily.  History of partial hysterectomy, c-section birth. Denies cholecystectomy, gallstone. Hydration seems inadequate despite reporting experiencing dry mouth.   We reviewed her most recent abdominal CT and diagnosis of diverticulosis and discussed repeating colonoscopy.   Patient denies  diarrhea, constipation, blood in stool, black stool,abdominal pain, bloating, reflux .  GI Hx:  CT Abdomen Pelvis w contrast 11-17-22 1. No CT evidence of acute pancreatitis. 2. Sigmoid diverticulosis. Mild stranding adjacent to the sigmoid colon, likely chronic and scarring. Mild acute diverticulitis is less likely but not excluded. No diverticular abscess or perforation. 3.  Aortic Atherosclerosis (ICD10-I70.0).    Current Outpatient Medications:    amLODipine (NORVASC) 5 MG tablet, Take 1 tablet (5 mg total) by mouth daily., Disp: 30 tablet, Rfl: 0   Evolocumab (REPATHA SURECLICK) 140 MG/ML SOAJ, Inject 140 mg into the skin every 14 (fourteen) days., Disp: 2 mL, Rfl: 2   fenofibrate 160 MG tablet, Take 1 tablet (160 mg total) by mouth daily., Disp: 90 tablet, Rfl: 3   metoprolol succinate (TOPROL-XL) 50 MG 24 hr tablet, Take with or immediately following a meal., Disp: 90 tablet, Rfl: 2   pantoprazole (PROTONIX) 40 MG tablet, Take 1 tablet (40 mg total) by mouth daily., Disp: 30 tablet, Rfl: 0   potassium chloride (KLOR-CON) 10 MEQ tablet, TAKE 2 TO 3 TABLETS BY MOUTH DAILY AS DIRECTED, Disp: 270 tablet, Rfl: 3   prednisoLONE acetate (PRED FORTE) 1 % ophthalmic suspension, Place 1 drop into the right eye 4 (four) times daily., Disp: , Rfl:    Tapentadol HCl 100 MG TABS, Take 1 tablet (100 mg total) by mouth 4 (four) times daily., Disp: 120 tablet, Rfl: 0   [  START ON 12/16/2022] Tapentadol HCl 100 MG TABS, Take 1 tablet (100 mg total) by mouth 4 (four) times daily for 15 days., Disp: 60 tablet, Rfl: 0   ondansetron (ZOFRAN-ODT) 8 MG disintegrating tablet, Take 1 tablet (8 mg total) by mouth every 8 (eight) hours as needed for nausea or vomiting. (Patient not taking: Reported on 11/20/2022), Disp: 12 tablet, Rfl: 0   oxyCODONE-acetaminophen (PERCOCET/ROXICET) 5-325 MG tablet, 1 qid prn severe breakthrough pain (Patient not taking: Reported on 11/20/2022), Disp: 30 tablet, Rfl: 0   Tapentadol  HCl 100 MG TABS, Take 1 tablet (100 mg total) by mouth 4 (four) times daily., Disp: 120 tablet, Rfl: 0   Allergies as of 11/20/2022 - Review Complete 11/20/2022  Allergen Reaction Noted   Keflex [cephalexin]  04/20/2022   Statins Other (See Comments) 09/21/2014   Zetia [ezetimibe] Other (See Comments) 04/20/2022   Penicillins Rash and Other (See Comments) 07/17/2012   Sulfa antibiotics Rash and Other (See Comments) 07/17/2012    Past Medical History:  Diagnosis Date   Cataract    Chronic narcotic use    takes Nucynta for neuropathic pain   Headache    Hereditary and idiopathic peripheral neuropathy    followed by pcp   Hyperlipidemia    Hypertension    Hypokalemia    OA (osteoarthritis)    LEFT KNEE   Peripheral neuropathy     Past Surgical History:  Procedure Laterality Date   ABDOMINAL HYSTERECTOMY  1980s   W/ UNILATERAL SALPINGOOPHORECTOMY   CATARACT EXTRACTION Right    CESAREAN SECTION  x2   last one 1975   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   EYE SURGERY     KNEE ARTHROSCOPY Right x2  last one 2016   KNEE CLOSED REDUCTION Left 04/11/2018   Procedure: CLOSED MANIPULATION KNEE;  Surgeon: Durene Romans, MD;  Location: [facility];  Service: Orthopedics;  Laterality: Left;   SHOULDER OPEN ROTATOR CUFF REPAIR Left 1990s   TONSILLECTOMY  age 19   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   Procedure: RIGHT TOTAL KNEE ARTHROPLASTY;  Surgeon: Durene Romans, MD;  Location: [facility];  Service: Orthopedics;  Laterality: Right;   TOTAL KNEE ARTHROPLASTY Left 02/05/2018   Procedure: LEFT TOTAL KNEE ARTHROPLASTY;  Surgeon: Durene Romans, MD;  Location: [facility];  Service: Orthopedics;  Laterality: Left;  70 mins    Family History  Problem Relation Age of Onset   Hypertension Mother    Hyperlipidemia Mother    Stroke Mother    Heart disease Mother    Hypertension Father    Dementia Father    Neuropathy Father    Hypertension Brother    Arthritis Brother     Social History    Socioeconomic History   Marital status: Widowed    Spouse name: Not on file   Number of children: 2   Years of education: college   Highest education level: Bachelor's degree (e.g., BA, AB, BS)  Occupational History   Occupation: Retired   Occupation: Scientist, research (physical sciences)  Tobacco Use   Smoking status: Former    Current packs/day: 0.00    Average packs/day: 0.5 packs/day for 30.0 years (15.0 ttl pk-yrs)    Types: Cigarettes    Start date: 07/17/1968    Quit date: 07/18/1998    Years since quitting: 24.3   Smokeless tobacco: Never  Vaping Use   Vaping status: Never Used  Substance and Sexual Activity   Alcohol use: Yes    Alcohol/week: 4.0 standard drinks of  alcohol    Types: 4 Glasses of wine per week    Comment: 1/month   Drug use: Never   Sexual activity: Not Currently    Birth control/protection: Surgical  Other Topics Concern   Not on file  Social History Narrative   Lives alone. Has 2 adult sons and 4 grandchildren.   Social Determinants of Health   Financial Resource Strain: Low Risk  (07/21/2022)   Overall Financial Resource Strain (CARDIA)    Difficulty of Paying Living Expenses: Not hard at all  Food Insecurity: No Food Insecurity (07/21/2022)   Hunger Vital Sign    Worried About Running Out of Food in the Last Year: Never true    Ran Out of Food in the Last Year: Never true  Transportation Needs: No Transportation Needs (07/21/2022)   PRAPARE - Administrator, Civil Service (Medical): No    Lack of Transportation (Non-Medical): No  Physical Activity: Insufficiently Active (07/21/2022)   Exercise Vital Sign    Days of Exercise per Week: 2 days    Minutes of Exercise per Session: 20 min  Stress: No Stress Concern Present (07/21/2022)   [employer] of Occupational Health - Occupational Stress Questionnaire    Feeling of Stress : Not at all  Social Connections: Moderately Integrated (07/21/2022)   Social Connection and Isolation Panel [NHANES]     Frequency of Communication with Friends and Family: More than three times a week    Frequency of Social Gatherings with Friends and Family: More than three times a week    Attends Religious Services: More than 4 times per year    Active Member of [employer] or Organizations: Yes    Attends Banker Meetings: More than 4 times per year    Marital Status: Widowed  Intimate Partner Violence: Not At Risk (07/21/2022)   Humiliation, Afraid, Rape, and Kick questionnaire    Fear of Current or Ex-Partner: No    Emotionally Abused: No    Physically Abused: No    Sexually Abused: No      Review of systems: Review of Systems  Constitutional:  Positive for unexpected weight change (25 pounds loss).  HENT:  Positive for trouble swallowing.   Gastrointestinal:  Positive for abdominal distention, abdominal pain, nausea and vomiting. Negative for anal bleeding, blood in stool, constipation, diarrhea and rectal pain.      Physical Exam: Vitals:   11/20/22 1056  BP: 116/72  Pulse: 60  SpO2: 98%   Body mass index is 24.51 kg/m.  General: well-appearing   Eyes: sclera anicteric, no redness ENT: oral mucosa moist without lesions, no cervical or supraclavicular lymphadenopathy CV: RRR, no JVD, no peripheral edema Resp: clear to auscultation bilaterally, normal RR and effort noted GI: Mid-abdominal tenderness radiating to back, soft, no tenderness, with active bowel sounds. No guarding or palpable organomegaly noted. Skin; warm and dry, no rash or jaundice noted Neuro: awake, alert and oriented x 3. Normal gross motor function and fluent speech   Data Reviewed:  Reviewed labs, radiology imaging, old records and pertinent past GI work up   Assessment and Plan/Recommendations:  77 year old very pleasant female here with complaints of unintentional weight loss 20 to 30 pounds within past 2 to 3 months, she has postprandial fullness, anorexia and intermittent nausea Patient also  complains of intermittent dysphagia We will plan to proceed with EGD for further evaluation, esophageal biopsies and dilation as needed for dysphagia  Significant unintentional weight loss is concerning, will need  to exclude malignancy.  If EGD unremarkable we will plan to proceed with colonoscopy as her last colonoscopy was over 10 years ago  She has mild elevation in lipase, CT abdomen pelvis negative for any acute pathology or lesions, will obtain MRI MRCP of pancreas to exclude any pancreatic lesion  Severe fatigue: Check vitamin D, TSH  The risks and benefits as well as alternatives of endoscopic procedure(s) have been discussed and reviewed. All questions answered. The patient agrees to proceed.   The patient was provided an opportunity to ask questions and all were answered. The patient agreed with the plan and demonstrated an understanding of the instructions.   Iona Beard , MD  CC: Mechele Claude, MD   Ladona Mow Hewitt Shorts as a scribe for Marsa Aris, MD.,have documented all relevant documentation on the behalf of Marsa Aris, MD,as directed by  Marsa Aris, MD while in the presence of Marsa Aris, MD.   I, Marsa Aris, MD, have reviewed all documentation for this visit. The documentation on 11/20/22 for the exam, diagnosis, procedures, and orders are all accurate and complete.

## 2022-11-17 ENCOUNTER — Ambulatory Visit (HOSPITAL_BASED_OUTPATIENT_CLINIC_OR_DEPARTMENT_OTHER)
Admission: RE | Admit: 2022-11-17 | Discharge: 2022-11-17 | Disposition: A | Discharge status: Home / Self Care | Payer: Medicare Other | Source: Ambulatory Visit | Attending: Family Medicine | Admitting: Family Medicine

## 2022-11-17 DIAGNOSIS — K5730 Diverticulosis of large intestine without perforation or abscess without bleeding: Secondary | ICD-10-CM | POA: Diagnosis not present

## 2022-11-17 DIAGNOSIS — K861 Other chronic pancreatitis: Secondary | ICD-10-CM | POA: Insufficient documentation

## 2022-11-17 DIAGNOSIS — D7389 Other diseases of spleen: Secondary | ICD-10-CM | POA: Diagnosis not present

## 2022-11-17 DIAGNOSIS — K449 Diaphragmatic hernia without obstruction or gangrene: Secondary | ICD-10-CM | POA: Diagnosis not present

## 2022-11-17 MED ORDER — IOHEXOL 300 MG/ML  SOLN
100.0000 mL | Freq: Once | INTRAMUSCULAR | Status: AC | PRN
  Administered 2022-11-17: 80 mL via INTRAVENOUS

## 2022-11-20 ENCOUNTER — Encounter: Payer: Self-pay | Admitting: Gastroenterology

## 2022-11-20 ENCOUNTER — Other Ambulatory Visit (INDEPENDENT_AMBULATORY_CARE_PROVIDER_SITE_OTHER): Payer: Medicare Other

## 2022-11-20 ENCOUNTER — Ambulatory Visit (INDEPENDENT_AMBULATORY_CARE_PROVIDER_SITE_OTHER): Payer: Medicare Other | Admitting: Gastroenterology

## 2022-11-20 VITALS — BP 116/72 | HR 60 | Ht 69.0 in | Wt 166.0 lb

## 2022-11-20 DIAGNOSIS — R1310 Dysphagia, unspecified: Secondary | ICD-10-CM | POA: Diagnosis not present

## 2022-11-20 DIAGNOSIS — R112 Nausea with vomiting, unspecified: Secondary | ICD-10-CM

## 2022-11-20 DIAGNOSIS — R630 Anorexia: Secondary | ICD-10-CM | POA: Diagnosis not present

## 2022-11-20 DIAGNOSIS — R634 Abnormal weight loss: Secondary | ICD-10-CM

## 2022-11-20 DIAGNOSIS — R140 Abdominal distension (gaseous): Secondary | ICD-10-CM

## 2022-11-20 LAB — VITAMIN D 25 HYDROXY (VIT D DEFICIENCY, FRACTURES): VITD: 25.8 ng/mL — ABNORMAL LOW (ref 30.00–100.00)

## 2022-11-20 LAB — COMPREHENSIVE METABOLIC PANEL
ALT: 12 U/L (ref 0–35)
AST: 15 U/L (ref 0–37)
Albumin: 4.4 g/dL (ref 3.5–5.2)
Alkaline Phosphatase: 50 U/L (ref 39–117)
BUN: 32 mg/dL — ABNORMAL HIGH (ref 6–23)
CO2: 30 mEq/L (ref 19–32)
Calcium: 10 mg/dL (ref 8.4–10.5)
Chloride: 104 mEq/L (ref 96–112)
Creatinine, Ser: 1.13 mg/dL (ref 0.40–1.20)
GFR: 47.22 mL/min — ABNORMAL LOW (ref >60.00)
Glucose, Bld: 88 mg/dL (ref 70–99)
Potassium: 4.5 mEq/L (ref 3.5–5.1)
Sodium: 142 mEq/L (ref 135–145)
Total Bilirubin: 0.6 mg/dL (ref 0.2–1.2)
Total Protein: 6.5 g/dL (ref 6.0–8.3)

## 2022-11-20 LAB — LIPASE: Lipase: 60 U/L — ABNORMAL HIGH (ref 11.0–59.0)

## 2022-11-20 LAB — AMYLASE: Amylase: 75 U/L (ref 27–131)

## 2022-11-20 LAB — TSH: TSH: 1.92 u[IU]/mL (ref 0.35–5.50)

## 2022-11-20 MED ORDER — SUTAB 1479-225-188 MG PO TABS: 24.0000 | ORAL_TABLET | Freq: Once | ORAL | 0 refills | Status: AC

## 2022-11-20 NOTE — Patient Instructions (Addendum)
You have been scheduled for an endoscopy. Please follow written instructions given to you at your visit today.  If you use inhalers (even only as needed), please bring them with you on the day of your procedure.  If you take any of the following medications, they will need to be adjusted prior to your procedure:   DO NOT TAKE 7 DAYS PRIOR TO TEST- Trulicity (dulaglutide) Ozempic, Wegovy (semaglutide) Mounjaro (tirzepatide) Bydureon Bcise (exanatide extended release)  DO NOT TAKE 1 DAY PRIOR TO YOUR TEST Rybelsus (semaglutide) Adlyxin (lixisenatide) Victoza (liraglutide) Byetta (exanatide) ___________________________________________________________________________     You have been scheduled for a colonoscopy. Please follow written instructions given to you at your visit today.   Please pick up your prep supplies at the pharmacy within the next 1-3 days.  If you use inhalers (even only as needed), please bring them with you on the day of your procedure.  DO NOT TAKE 7 DAYS PRIOR TO TEST- Trulicity (dulaglutide) Ozempic, Wegovy (semaglutide) Mounjaro (tirzepatide) Bydureon Bcise (exanatide extended release)  DO NOT TAKE 1 DAY PRIOR TO YOUR TEST Rybelsus (semaglutide) Adlyxin (lixisenatide) Victoza (liraglutide) Byetta (exanatide) ___________________________________________________________________________   Due to recent changes in healthcare laws, you may see the results of your imaging and laboratory studies on MyChart before your provider has had a chance to review them.  We understand that in some cases there may be results that are confusing or concerning to you. Not all laboratory results come back in the same time frame and the provider may be waiting for multiple results in order to interpret others.  Please give [place] 48 hours in order for your provider to thoroughly review all the results before contacting the office for clarification of your results.   Your provider has  requested that you go to the basement level for lab work before leaving today. Press "B" on the elevator. The lab is located at the first door on the left as you exit the elevator.   Eat small frequent meals increase water intake per day    I appreciate the  opportunity to care for you  Thank You   Marsa Aris , MD

## 2022-11-21 NOTE — Progress Notes (Signed)
Patient r/c  

## 2022-11-22 ENCOUNTER — Encounter: Payer: Self-pay | Admitting: Gastroenterology

## 2022-11-22 ENCOUNTER — Ambulatory Visit (AMBULATORY_SURGERY_CENTER): Payer: Medicare Other | Admitting: Gastroenterology

## 2022-11-22 VITALS — BP 115/50 | HR 58 | Temp 97.5°F | Resp 15 | Ht 67.0 in | Wt 162.0 lb

## 2022-11-22 DIAGNOSIS — D128 Benign neoplasm of rectum: Secondary | ICD-10-CM | POA: Diagnosis not present

## 2022-11-22 DIAGNOSIS — D123 Benign neoplasm of transverse colon: Secondary | ICD-10-CM | POA: Diagnosis not present

## 2022-11-22 DIAGNOSIS — K2100 Gastro-esophageal reflux disease with esophagitis, without bleeding: Secondary | ICD-10-CM

## 2022-11-22 DIAGNOSIS — R1310 Dysphagia, unspecified: Secondary | ICD-10-CM

## 2022-11-22 DIAGNOSIS — K319 Disease of stomach and duodenum, unspecified: Secondary | ICD-10-CM | POA: Diagnosis not present

## 2022-11-22 DIAGNOSIS — K2970 Gastritis, unspecified, without bleeding: Secondary | ICD-10-CM

## 2022-11-22 DIAGNOSIS — R630 Anorexia: Secondary | ICD-10-CM

## 2022-11-22 DIAGNOSIS — K229 Disease of esophagus, unspecified: Secondary | ICD-10-CM | POA: Diagnosis not present

## 2022-11-22 DIAGNOSIS — R634 Abnormal weight loss: Secondary | ICD-10-CM

## 2022-11-22 DIAGNOSIS — D122 Benign neoplasm of ascending colon: Secondary | ICD-10-CM | POA: Diagnosis not present

## 2022-11-22 MED ORDER — SODIUM CHLORIDE 0.9 % IV SOLN: 500.0000 mL | INTRAVENOUS | Status: DC

## 2022-11-22 MED ORDER — ONDANSETRON 4 MG PO TBDP: 4.0000 mg | ORAL_TABLET | Freq: Three times a day (TID) | ORAL | 0 refills | Status: DC | PRN

## 2022-11-22 NOTE — Patient Instructions (Signed)
Clear liquid diet - follow Colonoscopy prep instructions already given to you  You will need to precede with a Colonoscopy as already scheduled tomorrow ( be here @ 0900)  Zofran a medication for nausea was ordered for you to take every 8 hrs when start colonoscopy prep    Handout on gastritis given to you today  Await biopsy results from stomach and esophagus     YOU HAD AN ENDOSCOPIC PROCEDURE TODAY AT THE [facility]:   Refer to the procedure report that was given to you for any specific questions about what was found during the examination.  If the procedure report does not answer your questions, please call your gastroenterologist to clarify.  If you requested that your care partner not be given the details of your procedure findings, then the procedure report has been included in a sealed envelope for you to review at your convenience later.  YOU SHOULD EXPECT: Some feelings of bloating in the abdomen. Passage of more gas than usual.  Walking can help get rid of the air that was put into your GI tract during the procedure and reduce the bloating. If you had a lower endoscopy (such as a colonoscopy or flexible sigmoidoscopy) you may notice spotting of blood in your stool or on the toilet paper. If you underwent a bowel prep for your procedure, you may not have a normal bowel movement for a few days.  Please Note:  You might notice some irritation and congestion in your nose or some drainage.  This is from the oxygen used during your procedure.  There is no need for concern and it should clear up in a day or so.  SYMPTOMS TO REPORT IMMEDIATELY:   Following upper endoscopy (EGD)  Vomiting of blood or coffee ground material  New chest pain or pain under the shoulder blades  Painful or persistently difficult swallowing  New shortness of breath  Fever of 100F or higher  Black, tarry-looking stools  For urgent or emergent issues, a gastroenterologist can be reached at any  hour by calling [phone]. Do not use MyChart messaging for urgent concerns.    DIET:  We do recommend a small meal at first, but then you may proceed to your regular diet.  Drink plenty of fluids but you should avoid alcoholic beverages for 24 hours.  ACTIVITY:  You should plan to take it easy for the rest of today and you should NOT DRIVE or use heavy machinery until tomorrow (because of the sedation medicines used during the test).    FOLLOW UP: Our staff will call the number listed on your records the next business day following your procedure.  We will call around 7:15- 8:00 am to check on you and address any questions or concerns that you may have regarding the information given to you following your procedure. If we do not reach you, we will leave a message.     If any biopsies were taken you will be contacted by phone or by letter within the next 1-3 weeks.  Please call us at [phone] if you have not heard about the biopsies in 3 weeks.    SIGNATURES/CONFIDENTIALITY: You and/or your care partner have signed paperwork which will be entered into your electronic medical record.  These signatures attest to the fact that that the information above on your After Visit Summary has been reviewed and is understood.  Full responsibility of the confidentiality of this discharge information lies with you and/or  your care-partner.

## 2022-11-22 NOTE — Progress Notes (Signed)
Called to room to assist during endoscopic procedure.  Patient ID and intended procedure confirmed with present staff. Received instructions for my participation in the procedure from the performing physician.  

## 2022-11-22 NOTE — Progress Notes (Unsigned)
Please refer to office visit note 11/20/22. No additional changes in H&P Patient is appropriate for planned procedure(s) and anesthesia in an ambulatory setting  K. Scherry Ran , MD [phone]

## 2022-11-22 NOTE — Op Note (Signed)
[facility] Patient Name: Brittany Mercer Procedure Date: 11/22/2022 4:02 PM MRN: [id number] Endoscopist: Napoleon Form , MD, [id number] Age: 77 Referring MD:  Date of Birth: [birth year] Gender: Female Account #: [id number] Procedure:                Upper GI endoscopy Indications:              Dyspepsia, Suspected reflux esophagitis, Anorexia,                            Dysphagia, Early satiety, Malnutrition,                            Regurgitation, Weight loss Medicines:                Monitored Anesthesia Care Procedure:                Pre-Anesthesia Assessment:                           - Prior to the procedure, a History and Physical                            was performed, and patient medications and                            allergies were reviewed. The patient's tolerance of                            previous anesthesia was also reviewed. The risks                            and benefits of the procedure and the sedation                            options and risks were discussed with the patient.                            All questions were answered, and informed consent                            was obtained. Prior Anticoagulants: The patient has                            taken no anticoagulant or antiplatelet agents. ASA                            Grade Assessment: III - A patient with severe                            systemic disease. After reviewing the risks and                            benefits, the patient was deemed in satisfactory  condition to undergo the procedure.                           After obtaining informed consent, the endoscope was                            passed under direct vision. Throughout the                            procedure, the patient's blood pressure, pulse, and                            oxygen saturations were monitored continuously. The                            GIF HQ190 #[id number] was  introduced through the                            mouth, and advanced to the second part of duodenum.                            The upper GI endoscopy was accomplished without                            difficulty. The patient tolerated the procedure                            well. Scope In: Scope Out: Findings:                 LA Grade C (one or more mucosal breaks continuous                            between tops of 2 or more mucosal folds, less than                            75% circumference) esophagitis with no bleeding was                            found 34 to 38 cm from the incisors. Biopsies were                            taken with a cold forceps for histology.                           A 3 cm hiatal hernia was present.                           Patchy mild inflammation characterized by                            congestion (edema), erosions and erythema was found  in the gastric antrum and in the prepyloric region                            of the stomach. Biopsies were taken with a cold                            forceps for Helicobacter pylori testing.                           The cardia and gastric fundus were normal on                            retroflexion.                           The examined duodenum was normal. Complications:            No immediate complications. Estimated Blood Loss:     Estimated blood loss was minimal. Impression:               - LA Grade C candidiasis esophagitis with no                            bleeding. Biopsied.                           - 3 cm hiatal hernia.                           - Gastritis. Biopsied.                           - Normal examined duodenum. Recommendation:           - Patient has a contact number available for                            emergencies. The signs and symptoms of potential                            delayed complications were discussed with the                             patient. Return to normal activities tomorrow.                            Written discharge instructions were provided to the                            patient.                           - Clear liquid diet.                           - Continue present medications.                           -  Await pathology results.                           - Zofran ODT every 8 hours as needed X 6 tabs Napoleon Form, MD 11/22/2022 4:29:47 PM This report has been signed electronically.

## 2022-11-22 NOTE — Progress Notes (Unsigned)
Vss nad trans to pacu 

## 2022-11-23 ENCOUNTER — Ambulatory Visit (AMBULATORY_SURGERY_CENTER): Payer: Medicare Other | Admitting: Gastroenterology

## 2022-11-23 ENCOUNTER — Telehealth: Payer: Self-pay | Admitting: *Deleted

## 2022-11-23 ENCOUNTER — Encounter: Payer: Self-pay | Admitting: Gastroenterology

## 2022-11-23 ENCOUNTER — Ambulatory Visit: Payer: Medicare Other | Admitting: Family Medicine

## 2022-11-23 VITALS — BP 121/63 | HR 56 | Temp 97.5°F | Resp 14 | Ht 69.0 in | Wt 166.0 lb

## 2022-11-23 DIAGNOSIS — R627 Adult failure to thrive: Secondary | ICD-10-CM | POA: Diagnosis not present

## 2022-11-23 DIAGNOSIS — D122 Benign neoplasm of ascending colon: Secondary | ICD-10-CM | POA: Diagnosis not present

## 2022-11-23 DIAGNOSIS — K635 Polyp of colon: Secondary | ICD-10-CM | POA: Diagnosis not present

## 2022-11-23 DIAGNOSIS — R634 Abnormal weight loss: Secondary | ICD-10-CM | POA: Diagnosis not present

## 2022-11-23 DIAGNOSIS — D123 Benign neoplasm of transverse colon: Secondary | ICD-10-CM

## 2022-11-23 DIAGNOSIS — D128 Benign neoplasm of rectum: Secondary | ICD-10-CM

## 2022-11-23 DIAGNOSIS — K5730 Diverticulosis of large intestine without perforation or abscess without bleeding: Secondary | ICD-10-CM | POA: Diagnosis not present

## 2022-11-23 MED ORDER — SODIUM CHLORIDE 0.9 % IV SOLN: 500.0000 mL | Freq: Once | INTRAVENOUS | Status: DC

## 2022-11-23 NOTE — Progress Notes (Signed)
Vssa nad trans to pacu

## 2022-11-23 NOTE — Op Note (Signed)
[facility] Patient Name: Brittany Mercer Procedure Date: 11/23/2022 10:11 AM MRN: [id number] Endoscopist: Napoleon Form , MD, [id number] Age: 77 Referring MD:  Date of Birth: 1945/12/03 Gender: Female Account #: [id number] Procedure:                Colonoscopy Indications:              Failure to thrive, Weight loss Medicines:                Monitored Anesthesia Care Procedure:                Pre-Anesthesia Assessment:                           - Prior to the procedure, a History and Physical                            was performed, and patient medications and                            allergies were reviewed. The patient's tolerance of                            previous anesthesia was also reviewed. The risks                            and benefits of the procedure and the sedation                            options and risks were discussed with the patient.                            All questions were answered, and informed consent                            was obtained. Prior Anticoagulants: The patient has                            taken no anticoagulant or antiplatelet agents. ASA                            Grade Assessment: II - A patient with mild systemic                            disease. After reviewing the risks and benefits,                            the patient was deemed in satisfactory condition to                            undergo the procedure.                           After obtaining informed consent, the colonoscope  was passed under direct vision. Throughout the                            procedure, the patient's blood pressure, pulse, and                            oxygen saturations were monitored continuously. The                            PCF-HQ190L Colonoscope [id number] was introduced                            through the anus and advanced to the the cecum,                            identified by  appendiceal orifice and ileocecal                            valve. The colonoscopy was performed without                            difficulty. The patient tolerated the procedure                            well. The quality of the bowel preparation was                            good. The ileocecal valve, appendiceal orifice, and                            rectum were photographed. Scope In: 10:20:01 AM Scope Out: 10:36:31 AM Scope Withdrawal Time: 0 hours 10 minutes 0 seconds  Total Procedure Duration: 0 hours 16 minutes 30 seconds  Findings:                 The perianal and digital rectal examinations were                            normal.                           Three sessile polyps were found in the rectum,                            transverse colon and ascending colon. The polyps                            were 4 to 5 mm in size. These polyps were removed                            with a cold snare. Resection and retrieval were                            complete.  Scattered small-mouthed diverticula were found in                            the sigmoid colon, descending colon and ascending                            colon.                           Non-bleeding external and internal hemorrhoids were                            found during retroflexion. The hemorrhoids were                            small. Complications:            No immediate complications. Estimated Blood Loss:     Estimated blood loss was minimal. Impression:               - Three 4 to 5 mm polyps in the rectum, in the                            transverse colon and in the ascending colon,                            removed with a cold snare. Resected and retrieved.                           - Diverticulosis in the sigmoid colon, in the                            descending colon and in the ascending colon.                           - Non-bleeding external and internal  hemorrhoids. Recommendation:           - Patient has a contact number available for                            emergencies. The signs and symptoms of potential                            delayed complications were discussed with the                            patient. Return to normal activities tomorrow.                            Written discharge instructions were provided to the                            patient.                           - Resume previous diet.                           -  Continue present medications.                           - Await pathology results.                           - No repeat colonoscopy due to age.                           - MRCP with contrast, to be scheduled for                            evaluation of unintentional weight loss >20 lbs in                            2-3 months, anorexia and elevated                            lipase/pancreatitis to exclude pancreas lesion                           - Return to GI clinic at the next available                            appointment in 2-3 months. Napoleon Form, MD 11/23/2022 10:43:05 AM This report has been signed electronically.

## 2022-11-23 NOTE — Telephone Encounter (Signed)
  Follow up Call-     11/22/2022    3:20 PM  Call back number  Post procedure Call Back phone  # [phone]  Permission to leave phone message Yes   [facility]

## 2022-11-23 NOTE — Patient Instructions (Signed)
-  Handout on polyps, diverticulosis and hemorrhoid provided -await pathology results -no-repeat colonoscopy for due to age.   -Continue present medications  - Office will call you to schedule MRCP  YOU HAD AN ENDOSCOPIC PROCEDURE TODAY AT THE [facility]:   Refer to the procedure report that was given to you for any specific questions about what was found during the examination.  If the procedure report does not answer your questions, please call your gastroenterologist to clarify.  If you requested that your care partner not be given the details of your procedure findings, then the procedure report has been included in a sealed envelope for you to review at your convenience later.  YOU SHOULD EXPECT: Some feelings of bloating in the abdomen. Passage of more gas than usual.  Walking can help get rid of the air that was put into your GI tract during the procedure and reduce the bloating. If you had a lower endoscopy (such as a colonoscopy or flexible sigmoidoscopy) you may notice spotting of blood in your stool or on the toilet paper. If you underwent a bowel prep for your procedure, you may not have a normal bowel movement for a few days.  Please Note:  You might notice some irritation and congestion in your nose or some drainage.  This is from the oxygen used during your procedure.  There is no need for concern and it should clear up in a day or so.  SYMPTOMS TO REPORT IMMEDIATELY:  Following lower endoscopy (colonoscopy or flexible sigmoidoscopy):  Excessive amounts of blood in the stool  Significant tenderness or worsening of abdominal pains  Swelling of the abdomen that is new, acute  Fever of 100F or higher   For urgent or emergent issues, a gastroenterologist can be reached at any hour by calling [phone]. Do not use MyChart messaging for urgent concerns.    DIET:  We do recommend a small meal at first, but then you may proceed to your regular diet.  Drink plenty of  fluids but you should avoid alcoholic beverages for 24 hours.  ACTIVITY:  You should plan to take it easy for the rest of today and you should NOT DRIVE or use heavy machinery until tomorrow (because of the sedation medicines used during the test).    FOLLOW UP: Our staff will call the number listed on your records the next business day following your procedure.  We will call around 7:15- 8:00 am to check on you and address any questions or concerns that you may have regarding the information given to you following your procedure. If we do not reach you, we will leave a message.     If any biopsies were taken you will be contacted by phone or by letter within the next 1-3 weeks.  Please call us at [phone] if you have not heard about the biopsies in 3 weeks.    SIGNATURES/CONFIDENTIALITY: You and/or your care partner have signed paperwork which will be entered into your electronic medical record.  These signatures attest to the fact that that the information above on your After Visit Summary has been reviewed and is understood.  Full responsibility of the confidentiality of this discharge information lies with you and/or your care-partner.

## 2022-11-23 NOTE — Progress Notes (Signed)
Called to room to assist during endoscopic procedure.  Patient ID and intended procedure confirmed with present staff. Received instructions for my participation in the procedure from the performing physician.  

## 2022-11-23 NOTE — Progress Notes (Signed)
No changes to health history since it was reviewed with pt during visit for EGD yesterday.

## 2022-11-23 NOTE — Progress Notes (Signed)
Please refer to office visit note 11/20/22. No additional changes in H&P Patient is appropriate for planned procedure(s) and anesthesia in an ambulatory setting  K. Scherry Ran , MD [phone]

## 2022-11-24 ENCOUNTER — Telehealth: Payer: Self-pay

## 2022-11-24 NOTE — Telephone Encounter (Signed)
  Follow up Call-     11/23/2022    9:34 AM 11/22/2022    3:20 PM  Call back number  Post procedure Call Back phone  # [phone]  Permission to leave phone message Yes Yes     Patient questions:  Do you have a fever, pain , or abdominal swelling? No. Pain Score  0 *  Have you tolerated food without any problems? Yes.    Have you been able to return to your normal activities? Yes.    Do you have any questions about your discharge instructions: Diet   No. Medications  No. Follow up visit  No.  Do you have questions or concerns about your Care? No.  Actions: * If pain score is 4 or above: No action needed, pain <4.

## 2022-11-29 DIAGNOSIS — H18512 Endothelial corneal dystrophy, left eye: Secondary | ICD-10-CM | POA: Diagnosis not present

## 2022-11-29 DIAGNOSIS — H2512 Age-related nuclear cataract, left eye: Secondary | ICD-10-CM | POA: Diagnosis not present

## 2022-11-29 DIAGNOSIS — Z947 Corneal transplant status: Secondary | ICD-10-CM | POA: Diagnosis not present

## 2022-11-29 DIAGNOSIS — Z961 Presence of intraocular lens: Secondary | ICD-10-CM | POA: Diagnosis not present

## 2022-11-30 ENCOUNTER — Telehealth: Payer: Self-pay | Admitting: Family Medicine

## 2022-11-30 NOTE — Telephone Encounter (Signed)
Pt returning missed call from nurse Liberty. Please call back. Unsure of what call was for. Pt says it was several days ago but is just seeing the missed call now.

## 2022-12-07 ENCOUNTER — Telehealth: Payer: Self-pay | Admitting: Gastroenterology

## 2022-12-07 ENCOUNTER — Ambulatory Visit (INDEPENDENT_AMBULATORY_CARE_PROVIDER_SITE_OTHER): Payer: Medicare Other | Admitting: Family Medicine

## 2022-12-07 ENCOUNTER — Encounter: Payer: Self-pay | Admitting: Family Medicine

## 2022-12-07 VITALS — BP 159/82 | HR 69 | Temp 97.5°F | Ht 69.0 in | Wt 160.8 lb

## 2022-12-07 DIAGNOSIS — G609 Hereditary and idiopathic neuropathy, unspecified: Secondary | ICD-10-CM

## 2022-12-07 DIAGNOSIS — M545 Low back pain, unspecified: Secondary | ICD-10-CM

## 2022-12-07 DIAGNOSIS — Z23 Encounter for immunization: Secondary | ICD-10-CM

## 2022-12-07 DIAGNOSIS — K861 Other chronic pancreatitis: Secondary | ICD-10-CM

## 2022-12-07 DIAGNOSIS — R634 Abnormal weight loss: Secondary | ICD-10-CM

## 2022-12-07 MED ORDER — LORAZEPAM 1 MG PO TABS: 1.0000 mg | ORAL_TABLET | Freq: Every day | ORAL | 0 refills | Status: DC

## 2022-12-07 MED ORDER — TAPENTADOL HCL 100 MG PO TABS
100.0000 mg | ORAL_TABLET | Freq: Four times a day (QID) | ORAL | 0 refills | Status: AC
Start: 2023-01-30 — End: 2023-02-14

## 2022-12-07 NOTE — Telephone Encounter (Signed)
PT was advised she would be called to have an MRCP scheduled with Dr. Lavon Paganini. Please advise.

## 2022-12-07 NOTE — Telephone Encounter (Addendum)
Spoke to the patient and she understands that she is to have the MRCP in 2-3 months. She will receive a call at that time. I will get with Dr Lavon Paganini just to be sure she does not want it sooner than what the report says.   Dr Lavon Paganini  Report correct ? MRCP in 2-3 months  Thanks

## 2022-12-07 NOTE — Progress Notes (Signed)
Subjective:  Patient ID: Brittany Mercer, female    DOB: 1946/03/19  Age: 77 y.o. MRN: [id number]  CC: Follow-up   HPI Brittany Mercer presents for Getting better. Back pain still bad with activity - just walking around for over 15 min. 8/10. Forces her to stop and lay down. To have a MRCP soon. No clear etiology as yet for symptoms.   i    12/07/2022    9:46 AM 11/09/2022   10:59 AM 11/09/2022   10:50 AM  Depression screen PHQ 2/9  Decreased Interest 0 1 0  Down, Depressed, Hopeless 0 0 0  PHQ - 2 Score 0 1 0  Altered sleeping  1   Tired, decreased energy  3   Change in appetite  3   Feeling bad or failure about yourself   0   Trouble concentrating  3   Moving slowly or fidgety/restless  0   Suicidal thoughts  0   PHQ-9 Score  11   Difficult doing work/chores  Somewhat difficult     History Brittany Mercer has a past medical history of Allergy, Cataract, Chronic narcotic use, Headache, Hereditary and idiopathic peripheral neuropathy, Hyperlipidemia, Hypertension, Hypokalemia, OA (osteoarthritis), and Peripheral neuropathy.   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   Her family history includes Arthritis in her brother; Dementia in her father; Heart disease in her mother; Hyperlipidemia in her mother; Hypertension in her brother, father, and mother; Neuropathy in her father; Stroke in her mother.She reports that she quit smoking about 24 years ago. Her smoking use included cigarettes. She started smoking about 54 years ago. She has a 15 pack-year smoking history. She  has never used smokeless tobacco. She reports current alcohol use of about 4.0 standard drinks of alcohol per week. She reports that she does not use drugs.    ROS Review of Systems  Constitutional: Negative.   HENT: Negative.    Eyes:  Negative for visual disturbance.  Respiratory:  Negative for shortness of breath.   Cardiovascular:  Negative for chest pain.  Gastrointestinal:  Negative for abdominal pain.  Musculoskeletal:  Negative for arthralgias.    Objective:  BP (!) 159/82   Pulse 69   Temp (!) 97.5 F (36.4 C)   Ht 5\' 9"  (1.753 m)   Wt 160 lb 12.8 oz (72.9 kg)   SpO2 99%   BMI 23.75 kg/m   BP Readings from Last 3 Encounters:  12/07/22 (!) 159/82  11/23/22 121/63  11/22/22 (!) 115/50    Wt Readings from Last 3 Encounters:  12/07/22 160 lb 12.8 oz (72.9 kg)  11/23/22 166 lb (75.3 kg)  11/22/22 162 lb (73.5 kg)     Physical Exam Constitutional:      General: She is not in acute distress.    Appearance: She is well-developed.  Cardiovascular:     Rate and Rhythm: Normal rate and regular rhythm.  Pulmonary:     Breath sounds: Normal breath sounds.  Musculoskeletal:        General: Normal range of motion.  Skin:    General: Skin is warm and dry.  Neurological:     Mental Status: She is  alert and oriented to person, place, and time.       Assessment & Plan:   Brittany Mercer" was seen today for follow-up.  Diagnoses and all orders for this visit:  Idiopathic peripheral neuropathy -     Tapentadol HCl 100 MG TABS; Take 1 tablet (100 mg total) by mouth 4 (four) times daily for 15 days.  Acute midline low back pain without sciatica  Encounter for immunization -     Flu Vaccine Trivalent High Dose (Fluad)  Other orders -     LORazepam (ATIVAN) 1 MG tablet; Take 1 tablet (1 mg total) by mouth at bedtime.       I have discontinued Brittany Mercer. Brittany "Trish"'s pantoprazole and oxyCODONE-acetaminophen. I am also having her start on LORazepam.  Additionally, I am having her maintain her prednisoLONE acetate, fenofibrate, potassium chloride, Repatha SureClick, metoprolol succinate, Tapentadol HCl, Tapentadol HCl, amLODipine, ondansetron, and Tapentadol HCl.  Allergies as of 12/07/2022       Reactions   Keflex [cephalexin]    GI, couldn't eat/drink, felt very sick   Statins Other (See Comments)   Myalgia, turns red   Zetia [ezetimibe] Other (See Comments)   Myopathy/redness   Penicillins Rash, Other (See Comments)   Pt not sure if she is allergic to penicillin or sulfa. So she does not take either one.  DID THE REACTION INVOLVE: Swelling of the face/tongue/throat, SOB, or low BP? Unknown Sudden or severe rash/hives, skin peeling, or the inside of the mouth or nose? No Did it require medical treatment? No When did it last happen?      childhood allergy If all above answers are "NO", may proceed with cephalosporin use.   Sulfa Antibiotics Rash, Other (See Comments)   Pt not sure if she is allergic to sulfa or penicillin so she doesn't take either one.         Medication List        Accurate as of December 07, 2022 11:59 PM. If you have any questions, ask your nurse or doctor.          STOP taking these medications    oxyCODONE-acetaminophen 5-325 MG tablet Commonly known as: PERCOCET/ROXICET Stopped by: Brittany Mercer   pantoprazole 40 MG tablet Commonly known as: Protonix Stopped by: Brittany Mercer       TAKE these medications    amLODipine 5 MG tablet Commonly known as: NORVASC Take 1 tablet (5 mg total) by mouth daily.   fenofibrate 160 MG tablet Take 1 tablet (160 mg total) by mouth daily.   LORazepam 1 MG tablet Commonly known as: ATIVAN Take 1 tablet (1 mg total) by mouth at bedtime. Started by: Brittany Mercer   metoprolol succinate 50 MG 24 hr tablet Commonly known as: TOPROL-XL Take with or immediately following a meal.   ondansetron 4 MG disintegrating tablet Commonly known as:  ZOFRAN-ODT Take 1 tablet (4 mg total) by mouth every 8 (eight) hours as needed for nausea or vomiting. Take 1 tablet every 8 hours for nausea   potassium chloride 10 MEQ tablet Commonly known as: KLOR-CON TAKE 2 TO 3 TABLETS BY MOUTH DAILY AS DIRECTED   prednisoLONE acetate 1 % ophthalmic suspension Commonly known as: PRED FORTE Place 1 drop into the right eye daily.   Repatha SureClick 140 MG/ML Soaj Generic drug: Evolocumab Inject 140 mg into the skin every 14 (fourteen) days.   Tapentadol HCl 100 MG Tabs Take 1 tablet (100 mg total) by mouth 4 (four) times  daily. What changed: Another medication with the same name was changed. Make sure you understand how and when to take each. Changed by: Brittany Mercer   Tapentadol HCl 100 MG Tabs Take 1 tablet (100 mg total) by mouth 4 (four) times daily. What changed: Another medication with the same name was changed. Make sure you understand how and when to take each. Changed by: Brittany Mercer   Tapentadol HCl 100 MG Tabs Take 1 tablet (100 mg total) by mouth 4 (four) times daily for 15 days. Start taking on: January 30, 2023 What changed: These instructions start on January 30, 2023. If you are unsure what to do until then, ask your doctor or other care provider. Changed by: Brittany Mercer         Follow-up: Return in about 1 month (around 01/06/2023).  Mechele Claude, M.D.

## 2022-12-07 NOTE — Telephone Encounter (Signed)
I have the report and it said schedule MRCP in 2-3 months Brittany Mercer no I have not scheduled this yet  But I will call her shortly Iv been gone getting a brain MRI Just got back and eating lunch

## 2022-12-11 ENCOUNTER — Encounter: Payer: Self-pay | Admitting: Family Medicine

## 2022-12-11 NOTE — Telephone Encounter (Signed)
I went on and put the orders in for radiology to go ahead and work on getting this scheduled for the patient.    Per Dr Angus Palms patient does not have to wait 2-3 months as stated on her report    Sent message to radiology April to schedule

## 2022-12-11 NOTE — Telephone Encounter (Signed)
We can schedule next available appointment for MRCP, we do not have to wait for 2 to 3 months.  Given unintentional significant weight loss, will need to exclude any pancreatic neoplasm.  Thank you

## 2022-12-14 ENCOUNTER — Other Ambulatory Visit: Payer: Self-pay | Admitting: Family Medicine

## 2022-12-14 DIAGNOSIS — E782 Mixed hyperlipidemia: Secondary | ICD-10-CM

## 2022-12-15 ENCOUNTER — Ambulatory Visit (HOSPITAL_COMMUNITY)
Admission: RE | Admit: 2022-12-15 | Discharge: 2022-12-15 | Disposition: A | Discharge status: Home / Self Care | Payer: Medicare Other | Source: Ambulatory Visit | Attending: Gastroenterology

## 2022-12-15 ENCOUNTER — Other Ambulatory Visit: Payer: Self-pay | Admitting: Gastroenterology

## 2022-12-15 DIAGNOSIS — K861 Other chronic pancreatitis: Secondary | ICD-10-CM | POA: Insufficient documentation

## 2022-12-15 DIAGNOSIS — R634 Abnormal weight loss: Secondary | ICD-10-CM

## 2022-12-15 DIAGNOSIS — K449 Diaphragmatic hernia without obstruction or gangrene: Secondary | ICD-10-CM | POA: Diagnosis not present

## 2022-12-15 MED ORDER — GADOBUTROL 1 MMOL/ML IV SOLN
7.0000 mL | Freq: Once | INTRAVENOUS | Status: AC | PRN
  Administered 2022-12-15: 7 mL via INTRAVENOUS

## 2022-12-18 DIAGNOSIS — M545 Low back pain, unspecified: Secondary | ICD-10-CM | POA: Insufficient documentation

## 2022-12-19 ENCOUNTER — Encounter: Payer: Self-pay | Admitting: Gastroenterology

## 2022-12-19 MED ORDER — TRAZODONE HCL 150 MG PO TABS: 150.0000 mg | ORAL_TABLET | Freq: Every day | ORAL | 1 refills | Status: DC

## 2022-12-25 DIAGNOSIS — M47896 Other spondylosis, lumbar region: Secondary | ICD-10-CM | POA: Diagnosis not present

## 2022-12-27 ENCOUNTER — Other Ambulatory Visit: Payer: Self-pay | Admitting: Family Medicine

## 2022-12-27 ENCOUNTER — Encounter: Payer: Self-pay | Admitting: Family Medicine

## 2022-12-27 DIAGNOSIS — G609 Hereditary and idiopathic neuropathy, unspecified: Secondary | ICD-10-CM

## 2022-12-27 MED ORDER — TAPENTADOL HCL 100 MG PO TABS: 100.0000 mg | ORAL_TABLET | Freq: Four times a day (QID) | ORAL | 0 refills | Status: DC

## 2023-01-03 ENCOUNTER — Other Ambulatory Visit (HOSPITAL_BASED_OUTPATIENT_CLINIC_OR_DEPARTMENT_OTHER): Payer: Self-pay

## 2023-01-03 ENCOUNTER — Other Ambulatory Visit: Payer: Self-pay | Admitting: Family Medicine

## 2023-01-03 DIAGNOSIS — M47816 Spondylosis without myelopathy or radiculopathy, lumbar region: Secondary | ICD-10-CM | POA: Insufficient documentation

## 2023-01-03 DIAGNOSIS — G609 Hereditary and idiopathic neuropathy, unspecified: Secondary | ICD-10-CM

## 2023-01-03 MED ORDER — PREDNISOLONE ACETATE 1 % OP SUSP
1.0000 [drp] | Freq: Every day | OPHTHALMIC | 11 refills | Status: AC
  Filled 2023-04-30: qty 10, 30d supply, fill #0

## 2023-01-03 MED ORDER — TAPENTADOL HCL 100 MG PO TABS
100.0000 mg | ORAL_TABLET | Freq: Four times a day (QID) | ORAL | 0 refills | Status: DC
  Filled 2023-01-03 (×2): qty 120, 30d supply, fill #0

## 2023-01-04 ENCOUNTER — Other Ambulatory Visit (HOSPITAL_BASED_OUTPATIENT_CLINIC_OR_DEPARTMENT_OTHER): Payer: Self-pay

## 2023-01-20 ENCOUNTER — Other Ambulatory Visit (HOSPITAL_BASED_OUTPATIENT_CLINIC_OR_DEPARTMENT_OTHER): Payer: Self-pay

## 2023-01-20 MED ORDER — MELOXICAM 15 MG PO TABS
15.0000 mg | ORAL_TABLET | Freq: Every day | ORAL | 5 refills | Status: DC
  Filled 2023-01-20: qty 30, 30d supply, fill #0

## 2023-01-22 DIAGNOSIS — M47896 Other spondylosis, lumbar region: Secondary | ICD-10-CM | POA: Diagnosis not present

## 2023-01-31 ENCOUNTER — Other Ambulatory Visit: Payer: Self-pay | Admitting: Family Medicine

## 2023-01-31 DIAGNOSIS — G609 Hereditary and idiopathic neuropathy, unspecified: Secondary | ICD-10-CM

## 2023-02-02 ENCOUNTER — Other Ambulatory Visit (HOSPITAL_BASED_OUTPATIENT_CLINIC_OR_DEPARTMENT_OTHER): Payer: Self-pay

## 2023-02-02 ENCOUNTER — Encounter: Payer: Self-pay | Admitting: Family Medicine

## 2023-02-02 ENCOUNTER — Other Ambulatory Visit: Payer: Self-pay | Admitting: Family Medicine

## 2023-02-02 DIAGNOSIS — G609 Hereditary and idiopathic neuropathy, unspecified: Secondary | ICD-10-CM

## 2023-02-04 ENCOUNTER — Other Ambulatory Visit (HOSPITAL_BASED_OUTPATIENT_CLINIC_OR_DEPARTMENT_OTHER): Payer: Self-pay

## 2023-02-04 MED ORDER — NUCYNTA 100 MG PO TABS
100.0000 mg | ORAL_TABLET | Freq: Four times a day (QID) | ORAL | 0 refills | Status: DC
  Filled 2023-02-04: qty 120, 30d supply, fill #0

## 2023-02-05 ENCOUNTER — Other Ambulatory Visit (HOSPITAL_BASED_OUTPATIENT_CLINIC_OR_DEPARTMENT_OTHER): Payer: Self-pay

## 2023-02-06 DIAGNOSIS — M47816 Spondylosis without myelopathy or radiculopathy, lumbar region: Secondary | ICD-10-CM | POA: Diagnosis not present

## 2023-02-09 ENCOUNTER — Encounter: Payer: Self-pay | Admitting: Gastroenterology

## 2023-02-16 ENCOUNTER — Other Ambulatory Visit (HOSPITAL_BASED_OUTPATIENT_CLINIC_OR_DEPARTMENT_OTHER): Payer: Self-pay

## 2023-02-16 ENCOUNTER — Encounter: Payer: Self-pay | Admitting: Family Medicine

## 2023-02-16 MED ORDER — MECLIZINE HCL 12.5 MG PO TABS
12.5000 mg | ORAL_TABLET | Freq: Three times a day (TID) | ORAL | 0 refills | Status: DC | PRN
  Filled 2023-02-16: qty 100, 34d supply, fill #0

## 2023-02-16 MED ORDER — POTASSIUM CHLORIDE ER 10 MEQ PO TBCR
20.0000 meq | EXTENDED_RELEASE_TABLET | Freq: Every day | ORAL | 0 refills | Status: DC
  Filled 2023-02-16: qty 173, 57d supply, fill #0
  Filled 2023-02-16: qty 97, 33d supply, fill #0

## 2023-02-22 ENCOUNTER — Emergency Department (HOSPITAL_BASED_OUTPATIENT_CLINIC_OR_DEPARTMENT_OTHER): Payer: Medicare Other

## 2023-02-22 ENCOUNTER — Other Ambulatory Visit: Payer: Self-pay

## 2023-02-22 ENCOUNTER — Encounter (HOSPITAL_COMMUNITY): Payer: Self-pay | Admitting: Internal Medicine

## 2023-02-22 ENCOUNTER — Observation Stay (HOSPITAL_BASED_OUTPATIENT_CLINIC_OR_DEPARTMENT_OTHER)
Admission: EM | Admit: 2023-02-22 | Discharge: 2023-02-23 | Disposition: A | Discharge status: Home / Self Care | Payer: Medicare Other | Attending: Internal Medicine | Admitting: Internal Medicine

## 2023-02-22 DIAGNOSIS — Z96653 Presence of artificial knee joint, bilateral: Secondary | ICD-10-CM | POA: Insufficient documentation

## 2023-02-22 DIAGNOSIS — I6202 Nontraumatic subacute subdural hemorrhage: Secondary | ICD-10-CM | POA: Diagnosis not present

## 2023-02-22 DIAGNOSIS — D696 Thrombocytopenia, unspecified: Secondary | ICD-10-CM | POA: Diagnosis not present

## 2023-02-22 DIAGNOSIS — M4313 Spondylolisthesis, cervicothoracic region: Secondary | ICD-10-CM | POA: Diagnosis not present

## 2023-02-22 DIAGNOSIS — M5021 Other cervical disc displacement,  high cervical region: Secondary | ICD-10-CM | POA: Diagnosis not present

## 2023-02-22 DIAGNOSIS — G609 Hereditary and idiopathic neuropathy, unspecified: Secondary | ICD-10-CM | POA: Diagnosis not present

## 2023-02-22 DIAGNOSIS — R519 Headache, unspecified: Secondary | ICD-10-CM | POA: Diagnosis present

## 2023-02-22 DIAGNOSIS — I10 Essential (primary) hypertension: Secondary | ICD-10-CM | POA: Diagnosis present

## 2023-02-22 DIAGNOSIS — E876 Hypokalemia: Secondary | ICD-10-CM | POA: Diagnosis not present

## 2023-02-22 DIAGNOSIS — S065XAA Traumatic subdural hemorrhage with loss of consciousness status unknown, initial encounter: Secondary | ICD-10-CM | POA: Diagnosis not present

## 2023-02-22 DIAGNOSIS — W19XXXA Unspecified fall, initial encounter: Secondary | ICD-10-CM | POA: Insufficient documentation

## 2023-02-22 DIAGNOSIS — S065X0A Traumatic subdural hemorrhage without loss of consciousness, initial encounter: Secondary | ICD-10-CM | POA: Diagnosis not present

## 2023-02-22 DIAGNOSIS — H8110 Benign paroxysmal vertigo, unspecified ear: Secondary | ICD-10-CM | POA: Diagnosis present

## 2023-02-22 DIAGNOSIS — Z79899 Other long term (current) drug therapy: Secondary | ICD-10-CM | POA: Diagnosis not present

## 2023-02-22 DIAGNOSIS — M4802 Spinal stenosis, cervical region: Secondary | ICD-10-CM | POA: Diagnosis not present

## 2023-02-22 DIAGNOSIS — H8113 Benign paroxysmal vertigo, bilateral: Secondary | ICD-10-CM

## 2023-02-22 DIAGNOSIS — Z87891 Personal history of nicotine dependence: Secondary | ICD-10-CM | POA: Insufficient documentation

## 2023-02-22 DIAGNOSIS — E782 Mixed hyperlipidemia: Secondary | ICD-10-CM | POA: Diagnosis present

## 2023-02-22 DIAGNOSIS — M47812 Spondylosis without myelopathy or radiculopathy, cervical region: Secondary | ICD-10-CM | POA: Diagnosis not present

## 2023-02-22 LAB — CBC WITH DIFFERENTIAL/PLATELET
Abs Immature Granulocytes: 0.02 10*3/uL (ref 0.00–0.07)
Basophils Absolute: 0 10*3/uL (ref 0.0–0.1)
Basophils Relative: 1 %
Eosinophils Absolute: 0.1 10*3/uL (ref 0.0–0.5)
Eosinophils Relative: 2 %
HCT: 38.3 % (ref 36.0–46.0)
Hemoglobin: 13.7 g/dL (ref 12.0–15.0)
Immature Granulocytes: 0 %
Lymphocytes Relative: 19 %
Lymphs Abs: 1 10*3/uL (ref 0.7–4.0)
MCH: 31.7 pg (ref 26.0–34.0)
MCHC: 35.8 g/dL (ref 30.0–36.0)
MCV: 88.7 fL (ref 80.0–100.0)
Monocytes Absolute: 0.4 10*3/uL (ref 0.1–1.0)
Monocytes Relative: 7 %
Neutro Abs: 4 10*3/uL (ref 1.7–7.7)
Neutrophils Relative %: 71 %
Platelets: 200 10*3/uL (ref 150–400)
RBC: 4.32 MIL/uL (ref 3.87–5.11)
RDW: 12.1 % (ref 11.5–15.5)
WBC: 5.5 10*3/uL (ref 4.0–10.5)
nRBC: 0 % (ref 0.0–0.2)

## 2023-02-22 LAB — BASIC METABOLIC PANEL
Anion gap: 10 (ref 5–15)
BUN: 18 mg/dL (ref 8–23)
CO2: 25 mmol/L (ref 22–32)
Calcium: 9.7 mg/dL (ref 8.9–10.3)
Chloride: 107 mmol/L (ref 98–111)
Creatinine, Ser: 0.83 mg/dL (ref 0.44–1.00)
GFR, Estimated: >60 mL/min (ref >60)
Glucose, Bld: 88 mg/dL (ref 70–99)
Potassium: 3.5 mmol/L (ref 3.5–5.1)
Sodium: 142 mmol/L (ref 135–145)

## 2023-02-22 MED ORDER — ACETAMINOPHEN 500 MG PO TABS
1000.0000 mg | ORAL_TABLET | Freq: Once | ORAL | Status: AC
  Administered 2023-02-22: 1000 mg via ORAL
  Filled 2023-02-22: qty 2

## 2023-02-22 MED ORDER — LABETALOL HCL 5 MG/ML IV SOLN
10.0000 mg | Freq: Once | INTRAVENOUS | Status: AC
  Administered 2023-02-22: 10 mg via INTRAVENOUS
  Filled 2023-02-22: qty 4

## 2023-02-22 MED ORDER — HYDRALAZINE HCL 25 MG PO TABS: 25.0000 mg | ORAL_TABLET | Freq: Three times a day (TID) | ORAL | Status: DC

## 2023-02-22 MED ORDER — ACETAMINOPHEN 650 MG RE SUPP: 650.0000 mg | Freq: Four times a day (QID) | RECTAL | Status: DC | PRN

## 2023-02-22 MED ORDER — MECLIZINE HCL 12.5 MG PO TABS: 12.5000 mg | ORAL_TABLET | Freq: Three times a day (TID) | ORAL | Status: DC | PRN

## 2023-02-22 MED ORDER — METOPROLOL SUCCINATE ER 50 MG PO TB24
50.0000 mg | ORAL_TABLET | Freq: Every day | ORAL | Status: DC
  Administered 2023-02-23: 50 mg via ORAL
  Filled 2023-02-22: qty 1

## 2023-02-22 MED ORDER — MORPHINE SULFATE (PF) 4 MG/ML IV SOLN
4.0000 mg | Freq: Once | INTRAVENOUS | Status: AC
  Administered 2023-02-22: 4 mg via INTRAVENOUS
  Filled 2023-02-22: qty 1

## 2023-02-22 MED ORDER — ACETAMINOPHEN 325 MG PO TABS
650.0000 mg | ORAL_TABLET | Freq: Four times a day (QID) | ORAL | Status: DC | PRN
  Administered 2023-02-23 (×2): 650 mg via ORAL
  Filled 2023-02-22 (×3): qty 2

## 2023-02-22 MED ORDER — HYDRALAZINE HCL 20 MG/ML IJ SOLN
5.0000 mg | INTRAMUSCULAR | Status: DC | PRN
  Administered 2023-02-22: 5 mg via INTRAVENOUS
  Filled 2023-02-22: qty 1

## 2023-02-22 MED ORDER — SENNOSIDES-DOCUSATE SODIUM 8.6-50 MG PO TABS: 1.0000 | ORAL_TABLET | Freq: Every evening | ORAL | Status: DC | PRN

## 2023-02-22 MED ORDER — AMLODIPINE BESYLATE 10 MG PO TABS
10.0000 mg | ORAL_TABLET | Freq: Every day | ORAL | Status: DC
  Administered 2023-02-22: 10 mg via ORAL
  Filled 2023-02-22: qty 1

## 2023-02-22 MED ORDER — TAPENTADOL HCL 50 MG PO TABS
100.0000 mg | ORAL_TABLET | Freq: Three times a day (TID) | ORAL | Status: DC
  Administered 2023-02-22 – 2023-02-23 (×3): 100 mg via ORAL
  Filled 2023-02-22 (×3): qty 2

## 2023-02-22 MED ORDER — ONDANSETRON HCL 4 MG/2ML IJ SOLN
4.0000 mg | Freq: Once | INTRAMUSCULAR | Status: AC
  Administered 2023-02-22: 4 mg via INTRAVENOUS
  Filled 2023-02-22: qty 2

## 2023-02-22 MED ORDER — SODIUM CHLORIDE 0.9 % IV BOLUS
1000.0000 mL | Freq: Once | INTRAVENOUS | Status: AC
  Administered 2023-02-22: 1000 mL via INTRAVENOUS

## 2023-02-22 NOTE — ED Notes (Signed)
Report called to Corrie Dandy, RN at [facility].

## 2023-02-22 NOTE — ED Triage Notes (Signed)
Pt POV from home ambulatory to triage reporting severe headache after falling 5 days ago due to vertigo. Presumed syncopal episode, can't remember fall. Bruising noted around L eye, bilateral knots on forehead. Axo x4 at this time.

## 2023-02-22 NOTE — ED Notes (Addendum)
Attempt #2 to call report to [facility], nurse unavailable at this time. Instructed to call back in 10-15 minutes.

## 2023-02-22 NOTE — ED Notes (Signed)
Report given to [facility] transfer team.

## 2023-02-22 NOTE — ED Notes (Addendum)
Attempt to call report to 4NP at [facility], no answer.

## 2023-02-22 NOTE — Consult Note (Signed)
Reason for Consult: Subdural hematoma Referring Physician: Hospitalist  Brittany Mercer is an 77 y.o. female.   HPI:  77 year old female admitted with NSTEMI right subdural hematoma.  She fell about 5 days ago.  There is ecchymosis to the left face.  No loss of consciousness.  She came in with a headache.  No numbness tingling weakness visual changes or seizure activity.  CT scan showed a scam subdural on the right.  She is admitted to medicine.  Neurosurgical evaluation was requested.  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   EYE SURGERY     KNEE ARTHROSCOPY Right x2  last one 2016   KNEE CLOSED REDUCTION Left 04/11/2018   Procedure: CLOSED MANIPULATION KNEE;  Surgeon: Durene Romans, MD;  Location: [facility];  Service: Orthopedics;  Laterality: Left;   SHOULDER OPEN ROTATOR CUFF REPAIR Left 1990s   TONSILLECTOMY  age 62   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   Procedure: RIGHT TOTAL KNEE ARTHROPLASTY;  Surgeon: Durene Romans, MD;  Location: [facility];  Service: Orthopedics;  Laterality: Right;   TOTAL KNEE ARTHROPLASTY Left 02/05/2018   Procedure: LEFT TOTAL KNEE ARTHROPLASTY;  Surgeon: Durene Romans, MD;  Location: [facility];  Service: Orthopedics;  Laterality: Left;  70 mins    Allergies  Allergen Reactions   Keflex [Cephalexin]     GI, couldn't eat/drink, felt very sick   Statins Other (See Comments)    Myalgia, turns red   Zetia [Ezetimibe] Other (See Comments)     Myopathy/redness   Penicillins Rash and Other (See Comments)    Pt not sure if she is allergic to penicillin or sulfa. So she does not take either one.  DID THE REACTION INVOLVE: Swelling of the face/tongue/throat, SOB, or low BP? Unknown Sudden or severe rash/hives, skin peeling, or the inside of the mouth or nose? No Did it require medical treatment? No When did it last happen?      childhood allergy If all above answers are "NO", may proceed with cephalosporin use.   Sulfa Antibiotics Rash and Other (See Comments)    Pt not sure if she is allergic to sulfa or penicillin so she doesn't take either one.     Social History   Tobacco Use   Smoking status: Former    Current packs/day: 0.00    Average packs/day: 0.5 packs/day for 30.0 years (15.0 ttl pk-yrs)    Types: Cigarettes    Start date: 07/17/1968    Quit date: 07/18/1998    Years since quitting: 24.6   Smokeless tobacco: Never  Substance Use Topics   Alcohol use: Yes    Alcohol/week: 4.0 standard drinks of alcohol    Types: 4 Glasses of wine per week    Comment: 1/month    Family History  Problem Relation Age of Onset   Hypertension Mother    Hyperlipidemia Mother  Stroke Mother    Heart disease Mother    Hypertension Father    Dementia Father    Neuropathy Father    Hypertension Brother    Arthritis Brother    Colon cancer Neg Hx    Colon polyps Neg Hx    Esophageal cancer Neg Hx    Rectal cancer Neg Hx    Stomach cancer Neg Hx      Review of Systems  Positive ROS: neg  All other systems have been reviewed and were otherwise negative with the exception of those mentioned in the HPI and as above.  Objective: Vital signs in last 24 hours: Temp:  [97.9 F (36.6 C)-98.6 F (37 C)] 97.9 F (36.6 C) (11/28 2100) Pulse Rate:  [62-69] 62 (11/28 2100) Resp:  [12-19] 18 (11/28 2100) BP: (148-205)/(82-92) 148/92 (11/28 2100) SpO2:  [95 %-100 %] 99 % (11/28 2100) Weight:  [71.2 kg-72.1 kg] 72.1 kg (11/28  2100)  General Appearance: Alert, cooperative, no distress, appears stated age Head: Normocephalic, without obvious abnormality, atraumatic Eyes: PERRL, conjunctiva/corneas clear, EOM's intact     Neck: Supple, symmetrical, trachea midline Back: Symmetric, no curvature, ROM normal, no CVA tenderness Lungs:  respirations unlabored Heart: Regular rate and rhythm Abdomen: Soft, non-tender Extremities: Extremities normal, atraumatic, no cyanosis or edema Pulses: 2+ and symmetric all extremities Skin: Skin color, texture, turgor normal, no rashes or lesions  NEUROLOGIC:   Mental status: A&O x4, no aphasia, good attention span, Memory and fund of knowledge appear to be appropriate Motor Exam - grossly normal, normal tone and bulk Sensory Exam - grossly normal Reflexes: symmetric, no pathologic reflexes, No Hoffman's, No clonus Coordination - grossly normal Gait -not tested Balance -not tested Cranial Nerves: I: smell Not tested  II: visual acuity  OS: na  OD: na  II: visual fields Full to confrontation  II: pupils Equal, round, reactive to light  III,VII: ptosis None  III,IV,VI: extraocular muscles  Full ROM  V: mastication Normal  V: facial light touch sensation  Normal  V,VII: corneal reflex  Present  VII: facial muscle function - upper  Normal  VII: facial muscle function - lower Normal  VIII: hearing Not tested  IX: soft palate elevation  Normal  IX,X: gag reflex Present  XI: trapezius strength  5/5  XI: sternocleidomastoid strength 5/5  XI: neck flexion strength  5/5  XII: tongue strength  Normal    Data Review Lab Results  Component Value Date   WBC 5.5 02/22/2023   HGB 13.7 02/22/2023   HCT 38.3 02/22/2023   MCV 88.7 02/22/2023   PLT 200 02/22/2023   Lab Results  Component Value Date   NA 142 02/22/2023   K 3.5 02/22/2023   CL 107 02/22/2023   CO2 25 02/22/2023   BUN 18 02/22/2023   CREATININE 0.83 02/22/2023   GLUCOSE 88 02/22/2023   Lab Results   Component Value Date   INR 1.02 01/18/2015    Radiology: CT Head Wo Contrast  Result Date: 02/22/2023 CLINICAL DATA:  Head trauma.  Fell 5 days ago. EXAM: CT HEAD WITHOUT CONTRAST TECHNIQUE: Contiguous axial images were obtained from the base of the skull through the vertex without intravenous contrast. RADIATION DOSE REDUCTION: This exam was performed according to the departmental dose-optimization program which includes automated exposure control, adjustment of the mA and/or kV according to patient size and/or use of iterative reconstruction technique. COMPARISON:  CT head without contrast 03/03/2022 FINDINGS: Brain: A 5 mm isodense extra-axial collection is  present over the right convexity compatible with a subacute subdural hematoma. Slight crowding of the sulci is present. Midline shift of 1-2 mm is present. No parenchymal hemorrhage is present. Mild atrophy and white matter changes otherwise stable. Deep brain nuclei are within normal limits. The ventricles are of normal size. The brainstem and cerebellum are within normal limits. Midline structures are within normal limits. Vascular: Atherosclerotic calcifications are present within the cavernous internal carotid arteries bilaterally. No hyperdense vessel is present. Skull: Calvarium is intact. No focal lytic or blastic lesions are present. No significant extracranial soft tissue lesion is present. Sinuses/Orbits: The paranasal sinuses and mastoid air cells are clear. Right lens replacement is noted. Globes and orbits are otherwise within normal limits. IMPRESSION: 1. 5 mm subacute subdural hematoma over the right convexity. 2. Midline shift of 1-2 mm. 3. Stable atrophy and white matter disease. This likely reflects the sequela of chronic microvascular ischemia. Critical Value/emergent results were called by telephone at the time of interpretation on 02/22/2023 at 6:25 pm to provider DAN FLOYD , who verbally acknowledged these results. Electronically  Signed   By: Marin Roberts M.D.   On: 02/22/2023 18:28   CT Cervical Spine Wo Contrast  Result Date: 02/22/2023 CLINICAL DATA:  Fall 5 days ago. Bruising over the left eye and neck. EXAM: CT CERVICAL SPINE WITHOUT CONTRAST TECHNIQUE: Multidetector CT imaging of the cervical spine was performed without intravenous contrast. Multiplanar CT image reconstructions were also generated. RADIATION DOSE REDUCTION: This exam was performed according to the departmental dose-optimization program which includes automated exposure control, adjustment of the mA and/or kV according to patient size and/or use of iterative reconstruction technique. COMPARISON:  None Available. FINDINGS: Alignment: Slight degenerative retrolisthesis is present at C3-4 and C4-5. Slight anterolisthesis is present at C7-T1 T1-2. Skull base and vertebrae: The craniocervical junction is normal. Vertebral body heights are normal. No acute fractures are present. Soft tissues and spinal canal: No prevertebral fluid or swelling. No visible canal hematoma. Disc levels: Multilevel degenerative changes are present. Uncovertebral and facet hypertrophy contribute to right foraminal narrowing at C3-4 and C4-5. Bilateral scratched at moderate bilateral foraminal stenosis is present at C5-6. Upper chest: The lung apices are clear. The thoracic inlet is within normal limits. IMPRESSION: 1. No acute fracture or traumatic subluxation. 2. Multilevel degenerative changes of the cervical spine as described. Electronically Signed   By: Marin Roberts M.D.   On: 02/22/2023 18:28     Assessment/Plan: Estimated body mass index is 23.47 kg/m as calculated from the following:   Height as of this encounter: 5\' 9"  (1.753 m).   Weight as of this encounter: 36.55 kg.   77 year old female with a tiny right subdural hematoma that is likely subacute.  Her symptoms are likely postconcussive.  Repeat CT in the morning.  Can likely be discharged after that CT  with follow-up for follow   up CT in the office in the next week or so                            Tia Alert 02/22/2023 10:34 PM

## 2023-02-22 NOTE — Progress Notes (Signed)
Hospitalist Transfer Note:    Nursing staff, Please call [facility] Admits & Consults System-Wide number on Amion [phone]) as soon as patient's arrival, so appropriate admitting provider can evaluate the pt.   Transferring facility: [facility] Requesting provider: Dr. Adela Lank (EDP at [facility]) Reason for transfer: admission for further evaluation and management of acute subdural hematoma.    77  year old F who presented to [facility] ED complaining of  4-5 days of headache after experiencing a ground-level mechanical fall 4 to 5 days ago in which she struck the left portion of her head after tripping while attempting to ambulate at home.  Did not lose consciousness.  Not on any blood thinners.  Shortly after hitting her head as a component of this fall, she developed a progressive headache, ultimately prompting her presentation to [facility] this evening.  She experienced some mild dizziness for 1 to 2 days after her fall, but this has subsequently resolved and no report of or any objective evidence of acute focal neurologic deficits at this time.  Vital signs in the ED were notable for the following: Systolic blood pressures elevated in the 190s to low 200s, for which the patient is receiving a dose of IV labetalol at this time.  Heart rate in the 60s to 70s.    Labs were notable for: CBC showed hemoglobin of 13.7, compared to 15.4 in August 2024.  Imaging notable for CT head showed evidence of 5 mm subdural hematoma with minimal midline shift.   [facility] EDP d/w on-call neurosurgery, Leo Grosser, NP (working with Dr. Yetta Barre), who rec [facility] admit to [facility] and conveyed no need for urgent surgical intervention, but neurosurgery will formally consult and see patient in the AM. Leo Grosser also requests repeat CT head around 4 AM on 11/29.    Subsequently, I accepted this patient for transfer for inpatient admission to a pcu bed at [facility] for further work-up and management of the above.      Newton Pigg, DO Hospitalist

## 2023-02-22 NOTE — ED Notes (Signed)
MD notified of elevated BP.

## 2023-02-22 NOTE — ED Provider Notes (Signed)
[place] EMERGENCY DEPARTMENT AT [facility] Provider Note   CSN: [id number] Arrival date & time: 02/22/23  1730     History  Chief Complaint  Patient presents with   Brittany Mercer is a 77 y.o. female.  77 yo F with a chief complaints of a fall.  She thinks that this happened about 4 5 days ago.  She cannot quite remember.  She has been a bit dizzy.  She said when she gets up she feels unsteady she was having the room spinning but that has improved.  She still feels a bit lightheaded when she gets up and then that improves and she is able to get around but is very careful.  She said that she is bent most of the past 4 to 5 days in bed.  She was able to get up today and was having a worsening headache over the past 4 to 5 days to where she could not sleep last night and so came here for evaluation.   Fall       Home Medications Prior to Admission medications   Medication Sig Start Date End Date Taking? Authorizing Provider  amLODipine (NORVASC) 5 MG tablet Take 1 tablet (5 mg total) by mouth daily. 10/18/22   Linwood Dibbles, MD  Evolocumab (REPATHA SURECLICK) 140 MG/ML SOAJ INJECT 140 MG INTO THE SKIN EVERY 14 (FOURTEEN) DAYS. 12/14/22   Mechele Claude, MD  fenofibrate 160 MG tablet Take 1 tablet (160 mg total) by mouth daily. 01/25/22   Mechele Claude, MD  LORazepam (ATIVAN) 1 MG tablet Take 1 tablet (1 mg total) by mouth at bedtime. 12/07/22   Mechele Claude, MD  meclizine (ANTIVERT) 12.5 MG tablet Take 1 tablet (12.5 mg total) by mouth 3 (three) times daily as needed for dizziness. 02/16/23   Raliegh Ip, DO  meloxicam (MOBIC) 15 MG tablet Take 1 tablet (15 mg total) by mouth daily for joint and muscle pain 08/03/22   Mechele Claude, MD  metoprolol succinate (TOPROL-XL) 50 MG 24 hr tablet Take with or immediately following a meal. 10/18/22   Mechele Claude, MD  ondansetron (ZOFRAN-ODT) 4 MG disintegrating tablet Take 1 tablet (4 mg total) by mouth  every 8 (eight) hours as needed for nausea or vomiting. Take 1 tablet every 8 hours for nausea 11/22/22   Napoleon Form, MD  potassium chloride (KLOR-CON) 10 MEQ tablet Take 2-3 tablets (20-30 mEq total) by mouth daily as directed. 02/16/23   Raliegh Ip, DO  prednisoLONE acetate (PRED FORTE) 1 % ophthalmic suspension Place 1 drop into the right eye daily. 05/30/19   [provider]  prednisoLONE acetate (PRED FORTE) 1 % ophthalmic suspension Place 1 drop into the right eye daily. 12/01/22     Tapentadol HCl (NUCYNTA) 100 MG TABS Take 1 tablet (100 mg total) by mouth 4 (four) times daily. 02/04/23 03/07/23  Mechele Claude, MD  Tapentadol HCl 100 MG TABS Take 1 tablet (100 mg total) by mouth 4 (four) times daily. 10/18/22 11/17/22  Mechele Claude, MD  Tapentadol HCl 100 MG TABS Take 1 tablet (100 mg total) by mouth 4 (four) times daily. 11/17/22 12/17/22  Mechele Claude, MD  traZODone (DESYREL) 150 MG tablet Take 1 tablet (150 mg total) by mouth at bedtime. 12/19/22   Mechele Claude, MD      Allergies    Keflex [cephalexin], Statins, Zetia [ezetimibe], Penicillins, and Sulfa antibiotics    Review of Systems   Review of  Systems  Physical Exam Updated Vital Signs BP (!) 148/92 (BP Location: Right Arm)   Pulse 62   Temp 97.9 F (36.6 C) (Oral)   Resp 18   Ht 5\' 9"  (1.753 m)   Wt 72.1 kg   SpO2 99%   BMI 23.47 kg/m  Physical Exam Vitals and nursing note reviewed.  Constitutional:      General: She is not in acute distress.    Appearance: She is well-developed. She is not diaphoretic.  HENT:     Head: Normocephalic.     Comments: Patient has some nonacute appearing bruising to the left side of the face.  Extends down to the left anterior aspect of the neck.  She is able to rotate her head 45 degrees in either direction without discomfort.  No midline C-spine tenderness step-offs or deformities.  Extraocular motion intact. Eyes:     Pupils: Pupils are equal, round, and  reactive to light.  Cardiovascular:     Rate and Rhythm: Normal rate and regular rhythm.     Heart sounds: No murmur heard.    No friction rub. No gallop.  Pulmonary:     Effort: Pulmonary effort is normal.     Breath sounds: No wheezing or rales.  Abdominal:     General: There is no distension.     Palpations: Abdomen is soft.     Tenderness: There is no abdominal tenderness.  Musculoskeletal:        General: No tenderness.     Cervical back: Normal range of motion and neck supple.     Comments: Palpated from head to toe without any other obvious noted areas of bony tenderness.  Skin:    General: Skin is warm and dry.  Neurological:     Mental Status: She is alert and oriented to person, place, and time.  Psychiatric:        Behavior: Behavior normal.     ED Results / Procedures / Treatments   Labs (all labs ordered are listed, but only abnormal results are displayed) Labs Reviewed  CBC WITH DIFFERENTIAL/PLATELET  BASIC METABOLIC PANEL    EKG EKG Interpretation Date/Time:  Thursday February 22 2023 17:41:36 EST Ventricular Rate:  68 PR Interval:  143 QRS Duration:  90 QT Interval:  417 QTC Calculation: 444 R Axis:   -25  Text Interpretation: Sinus rhythm Left ventricular hypertrophy Nonspecific T abnormalities, lateral leads No significant change since last tracing Confirmed by Melene Plan [phone]) on 02/22/2023 5:52:10 PM  Radiology CT Head Wo Contrast  Result Date: 02/22/2023 CLINICAL DATA:  Head trauma.  Fell 5 days ago. EXAM: CT HEAD WITHOUT CONTRAST TECHNIQUE: Contiguous axial images were obtained from the base of the skull through the vertex without intravenous contrast. RADIATION DOSE REDUCTION: This exam was performed according to the departmental dose-optimization program which includes automated exposure control, adjustment of the mA and/or kV according to patient size and/or use of iterative reconstruction technique. COMPARISON:  CT head without contrast  03/03/2022 FINDINGS: Brain: A 5 mm isodense extra-axial collection is present over the right convexity compatible with a subacute subdural hematoma. Slight crowding of the sulci is present. Midline shift of 1-2 mm is present. No parenchymal hemorrhage is present. Mild atrophy and white matter changes otherwise stable. Deep brain nuclei are within normal limits. The ventricles are of normal size. The brainstem and cerebellum are within normal limits. Midline structures are within normal limits. Vascular: Atherosclerotic calcifications are present within the cavernous internal carotid arteries bilaterally.  No hyperdense vessel is present. Skull: Calvarium is intact. No focal lytic or blastic lesions are present. No significant extracranial soft tissue lesion is present. Sinuses/Orbits: The paranasal sinuses and mastoid air cells are clear. Right lens replacement is noted. Globes and orbits are otherwise within normal limits. IMPRESSION: 1. 5 mm subacute subdural hematoma over the right convexity. 2. Midline shift of 1-2 mm. 3. Stable atrophy and white matter disease. This likely reflects the sequela of chronic microvascular ischemia. Critical Value/emergent results were called by telephone at the time of interpretation on 02/22/2023 at 6:25 pm to provider Diksha Tagliaferro , who verbally acknowledged these results. Electronically Signed   By: Marin Roberts M.D.   On: 02/22/2023 18:28   CT Cervical Spine Wo Contrast  Result Date: 02/22/2023 CLINICAL DATA:  Fall 5 days ago. Bruising over the left eye and neck. EXAM: CT CERVICAL SPINE WITHOUT CONTRAST TECHNIQUE: Multidetector CT imaging of the cervical spine was performed without intravenous contrast. Multiplanar CT image reconstructions were also generated. RADIATION DOSE REDUCTION: This exam was performed according to the departmental dose-optimization program which includes automated exposure control, adjustment of the mA and/or kV according to patient size and/or  use of iterative reconstruction technique. COMPARISON:  None Available. FINDINGS: Alignment: Slight degenerative retrolisthesis is present at C3-4 and C4-5. Slight anterolisthesis is present at C7-T1 T1-2. Skull base and vertebrae: The craniocervical junction is normal. Vertebral body heights are normal. No acute fractures are present. Soft tissues and spinal canal: No prevertebral fluid or swelling. No visible canal hematoma. Disc levels: Multilevel degenerative changes are present. Uncovertebral and facet hypertrophy contribute to right foraminal narrowing at C3-4 and C4-5. Bilateral scratched at moderate bilateral foraminal stenosis is present at C5-6. Upper chest: The lung apices are clear. The thoracic inlet is within normal limits. IMPRESSION: 1. No acute fracture or traumatic subluxation. 2. Multilevel degenerative changes of the cervical spine as described. Electronically Signed   By: Marin Roberts M.D.   On: 02/22/2023 18:28    Procedures .Critical Care  Performed by: Melene Plan, DO Authorized by: Melene Plan, DO   Critical care provider statement:    Critical care time (minutes):  35   Critical care time was exclusive of:  Separately billable procedures and treating other patients   Critical care was time spent personally by me on the following activities:  Development of treatment plan with patient or surrogate, discussions with consultants, evaluation of patient's response to treatment, examination of patient, ordering and review of laboratory studies, ordering and review of radiographic studies, ordering and performing treatments and interventions, pulse oximetry, re-evaluation of patient's condition and review of old charts   Care discussed with: admitting provider       Medications Ordered in ED Medications  sodium chloride 0.9 % bolus 1,000 mL (0 mLs Intravenous Stopped 02/22/23 1929)  acetaminophen (TYLENOL) tablet 1,000 mg (1,000 mg Oral Given 02/22/23 1817)  morphine (PF)  4 MG/ML injection 4 mg (4 mg Intravenous Given 02/22/23 1851)  ondansetron (ZOFRAN) injection 4 mg (4 mg Intravenous Given 02/22/23 1851)  labetalol (NORMODYNE) injection 10 mg (10 mg Intravenous Given 02/22/23 1922)    ED Course/ Medical Decision Making/ A&P                                 Medical Decision Making Amount and/or Complexity of Data Reviewed Labs: ordered. Radiology: ordered.  Risk OTC drugs. Prescription drug management. Decision regarding hospitalization.  77 yo F with a chief complaints of a fall.  She thinks happened about 4 to 5 days ago but she is not totally certain.  She said that she struck her head and since then has been having a headache that seems to get better when she stands.  She was unable to sleep last night and so came into the ED for evaluation.  She was very dizzy at the onset but feels like the dizziness has gotten a bit better but has not quite gone away.  Will obtain a laboratory evaluation bolus of IV fluids CT of the head and C-spine.  CT of the head concerning for 5 mm subdural hematoma.  I discussed this with the radiologist.  Discussed the case with neurosurgery, spoke with Leo Grosser, recommends observation overnight and repeat head CT in the morning.  Will discuss with medicine.  The patients results and plan were reviewed and discussed.   Any x-rays performed were independently reviewed by myself.   Differential diagnosis were considered with the presenting HPI.  Medications  sodium chloride 0.9 % bolus 1,000 mL (0 mLs Intravenous Stopped 02/22/23 1929)  acetaminophen (TYLENOL) tablet 1,000 mg (1,000 mg Oral Given 02/22/23 1817)  morphine (PF) 4 MG/ML injection 4 mg (4 mg Intravenous Given 02/22/23 1851)  ondansetron (ZOFRAN) injection 4 mg (4 mg Intravenous Given 02/22/23 1851)  labetalol (NORMODYNE) injection 10 mg (10 mg Intravenous Given 02/22/23 1922)    Vitals:   02/22/23 1930 02/22/23 1945 02/22/23 1950 02/22/23 2100   BP: (!) 176/86  (!) 167/82 (!) 148/92  Pulse: 63 68 69 62  Resp: 19 16  18   Temp:    97.9 F (36.6 C)  TempSrc:    Oral  SpO2: 96% 95% 95% 99%  Weight:    72.1 kg  Height:        Final diagnoses:  SDH (subdural hematoma) (HCC)    Admission/ observation were discussed with the admitting physician, patient and/or family and they are comfortable with the plan.          Final Clinical Impression(s) / ED Diagnoses Final diagnoses:  SDH (subdural hematoma) Brittany Mercer [facility])    Rx / DC Orders ED Discharge Orders     None         Melene Plan, DO 02/22/23 2125

## 2023-02-22 NOTE — ED Notes (Signed)
[facility] transfer team at bedside packaging pt for transport.

## 2023-02-22 NOTE — H&P (Signed)
PCP:   Mechele Claude, MD   Chief Complaint:  Headache.  HPI: This is a 77 year old female past medical history of chronic narcotic use, Headache, Hereditary and idiopathic peripheral neuropathy, HLD, HTN, Hypokalemia, vertigo.  Per patient she developed vertigo 5 days ago and fell.  Per patient she does not recall hitting her head.  She landed on her sacrum, hit her left elbow..  She took meclizine.  On Tuesday she noted a small bruise over her left eye.  By Wednesday it was swollen shut.  Today she noted bruising all over the left side of her face down to her neck.  Initially the pain on the left side of her face was tolerable however over the past 2 days it has become significantly worse causing her not to be able to sleep the last 2 nights.  For the past 4 days she has been taking ibuprofen, Aleve, her prescribed meloxicam to relieve the pain.  Her pain continues to intensify.  She states this is all over the sides of her face, the back of her head, in the form of a headache.  She denies blurred vision or confusion.  She adds much of the past 4 days has been somewhat of a blur because of the intensity of her pain.  She has been the last 2 days in bed despite not being able to sleep.  She further drove herself to the ER today.  At [facility] CT head 5 mm subacute subdural hematoma with 1 to 2 mm midline shift.  Neurosurgery on-call Dr. Lorin Picket has agreed to see patient in consult.  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   EYE SURGERY     KNEE ARTHROSCOPY Right x2  last one 2016   KNEE CLOSED REDUCTION Left 04/11/2018   Procedure: CLOSED MANIPULATION KNEE;  Surgeon: Durene Romans, MD;  Location: [facility];  Service: Orthopedics;  Laterality: Left;   SHOULDER OPEN ROTATOR CUFF REPAIR Left 1990s   TONSILLECTOMY  age 59   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   Procedure: RIGHT TOTAL KNEE ARTHROPLASTY;  Surgeon: Durene Romans, MD;  Location: [facility];  Service: Orthopedics;  Laterality: Right;   TOTAL KNEE ARTHROPLASTY Left 02/05/2018   Procedure: LEFT TOTAL KNEE ARTHROPLASTY;  Surgeon: Durene Romans, MD;  Location: [facility];  Service: Orthopedics;  Laterality: Left;  70 mins    Medications: Prior to Admission medications   Medication Sig Start Date End Date Taking? Authorizing Provider  amLODipine (NORVASC) 5 MG tablet Take 1 tablet (5 mg total) by mouth daily. 10/18/22   Linwood Dibbles, MD  Evolocumab (REPATHA SURECLICK) 140 MG/ML SOAJ INJECT 140 MG INTO THE SKIN EVERY 14 (FOURTEEN) DAYS. 12/14/22   Mechele Claude, MD  fenofibrate 160 MG tablet Take 1 tablet (160 mg total) by mouth daily. 01/25/22  Mechele Claude, MD  LORazepam (ATIVAN) 1 MG tablet Take 1 tablet (1 mg total) by mouth at bedtime. 12/07/22   Mechele Claude, MD  meclizine (ANTIVERT) 12.5 MG tablet Take 1 tablet (12.5 mg total) by mouth 3 (three) times daily as needed for dizziness. 02/16/23   Raliegh Ip, DO  meloxicam (MOBIC) 15 MG tablet Take 1 tablet (15 mg total) by mouth daily for joint and muscle pain 08/03/22   Mechele Claude, MD  metoprolol succinate (TOPROL-XL) 50 MG 24 hr tablet Take with or immediately following a meal. 10/18/22   Mechele Claude, MD  ondansetron (ZOFRAN-ODT) 4 MG disintegrating tablet Take 1 tablet (4 mg total) by mouth every 8 (eight) hours as needed for nausea or vomiting. Take 1 tablet every 8 hours for nausea 11/22/22   Napoleon Form,  MD  potassium chloride (KLOR-CON) 10 MEQ tablet Take 2-3 tablets (20-30 mEq total) by mouth daily as directed. 02/16/23   Raliegh Ip, DO  prednisoLONE acetate (PRED FORTE) 1 % ophthalmic suspension Place 1 drop into the right eye daily. 05/30/19   [provider]  prednisoLONE acetate (PRED FORTE) 1 % ophthalmic suspension Place 1 drop into the right eye daily. 12/01/22     Tapentadol HCl (NUCYNTA) 100 MG TABS Take 1 tablet (100 mg total) by mouth 4 (four) times daily. 02/04/23 03/07/23  Mechele Claude, MD  Tapentadol HCl 100 MG TABS Take 1 tablet (100 mg total) by mouth 4 (four) times daily. 10/18/22 11/17/22  Mechele Claude, MD  Tapentadol HCl 100 MG TABS Take 1 tablet (100 mg total) by mouth 4 (four) times daily. 11/17/22 12/17/22  Mechele Claude, MD  traZODone (DESYREL) 150 MG tablet Take 1 tablet (150 mg total) by mouth at bedtime. 12/19/22   Mechele Claude, MD    Allergies:   Allergies  Allergen Reactions   Keflex [Cephalexin]     GI, couldn't eat/drink, felt very sick   Statins Other (See Comments)    Myalgia, turns red   Zetia [Ezetimibe] Other (See Comments)    Myopathy/redness   Penicillins Rash and Other (See Comments)    Pt not sure if she is allergic to penicillin or sulfa. So she does not take either one.  DID THE REACTION INVOLVE: Swelling of the face/tongue/throat, SOB, or low BP? Unknown Sudden or severe rash/hives, skin peeling, or the inside of the mouth or nose? No Did it require medical treatment? No When did it last happen?      childhood allergy If all above answers are "NO", may proceed with cephalosporin use.   Sulfa Antibiotics Rash and Other (See Comments)    Pt not sure if she is allergic to sulfa or penicillin so she doesn't take either one.     Social History:  reports that she quit smoking about 24 years ago. Her smoking use included cigarettes. She started smoking about 54 years ago. She has a 15 pack-year smoking history. She has never used  smokeless tobacco. She reports current alcohol use of about 4.0 standard drinks of alcohol per week. She reports that she does not use drugs.  Family History: Family History  Problem Relation Age of Onset   Hypertension Mother    Hyperlipidemia Mother    Stroke Mother    Heart disease Mother    Hypertension Father    Dementia Father    Neuropathy Father    Hypertension Brother    Arthritis Brother    Colon cancer Neg Hx  Colon polyps Neg Hx    Esophageal cancer Neg Hx    Rectal cancer Neg Hx    Stomach cancer Neg Hx     Physical Exam: Vitals:   02/22/23 1930 02/22/23 1945 02/22/23 1950 02/22/23 2100  BP: (!) 176/86  (!) 167/82 (!) 148/92  Pulse: 63 68 69 62  Resp: 19 16  18   Temp:    97.9 F (36.6 C)  TempSrc:    Oral  SpO2: 96% 95% 95% 99%  Weight:    72.1 kg  Height:        General: A and O x 3, well developed and nourished, no acute distress Eyes: Pink conjunctiva, no scleral icterus ENT: Left-sided facial bruising Lungs: c CTA B/L, no wheeze, no crackles, no use of accessory muscles Cardiovascular: RRR, no gallops, no murmurs. No carotid bruits, no JVD Abdomen: soft, positive BS, non-tender, non-distended, no organomegaly, not an acute abdomen GU: not examined Neuro: CN II - XII grossly intact, sensation intact.  No neurological deficits Musculoskeletal: strength 5/5 all extremities, no edema Skin: no rash, no subcutaneous crepitation, no decubitus Psych: appropriate patient  Labs on Admission:  Recent Labs    02/22/23 1824  NA 142  K 3.5  CL 107  CO2 25  GLUCOSE 88  BUN 18  CREATININE 0.83  CALCIUM 9.7    Recent Labs    02/22/23 1824  WBC 5.5  NEUTROABS 4.0  HGB 13.7  HCT 38.3  MCV 88.7  PLT 200    Radiological Exams on Admission: CT Head Wo Contrast  Result Date: 02/22/2023 CLINICAL DATA:  Head trauma.  Fell 5 days ago. EXAM: CT HEAD WITHOUT CONTRAST TECHNIQUE: Contiguous axial images were obtained from the base of the skull through  the vertex without intravenous contrast. RADIATION DOSE REDUCTION: This exam was performed according to the departmental dose-optimization program which includes automated exposure control, adjustment of the mA and/or kV according to patient size and/or use of iterative reconstruction technique. COMPARISON:  CT head without contrast 03/03/2022 FINDINGS: Brain: A 5 mm isodense extra-axial collection is present over the right convexity compatible with a subacute subdural hematoma. Slight crowding of the sulci is present. Midline shift of 1-2 mm is present. No parenchymal hemorrhage is present. Mild atrophy and white matter changes otherwise stable. Deep brain nuclei are within normal limits. The ventricles are of normal size. The brainstem and cerebellum are within normal limits. Midline structures are within normal limits. Vascular: Atherosclerotic calcifications are present within the cavernous internal carotid arteries bilaterally. No hyperdense vessel is present. Skull: Calvarium is intact. No focal lytic or blastic lesions are present. No significant extracranial soft tissue lesion is present. Sinuses/Orbits: The paranasal sinuses and mastoid air cells are clear. Right lens replacement is noted. Globes and orbits are otherwise within normal limits. IMPRESSION: 1. 5 mm subacute subdural hematoma over the right convexity. 2. Midline shift of 1-2 mm. 3. Stable atrophy and white matter disease. This likely reflects the sequela of chronic microvascular ischemia. Critical Value/emergent results were called by telephone at the time of interpretation on 02/22/2023 at 6:25 pm to provider DAN FLOYD , who verbally acknowledged these results. Electronically Signed   By: Marin Roberts M.D.   On: 02/22/2023 18:28   CT Cervical Spine Wo Contrast  Result Date: 02/22/2023 CLINICAL DATA:  Fall 5 days ago. Bruising over the left eye and neck. EXAM: CT CERVICAL SPINE WITHOUT CONTRAST TECHNIQUE: Multidetector CT imaging  of the cervical spine was performed without intravenous contrast.  Multiplanar CT image reconstructions were also generated. RADIATION DOSE REDUCTION: This exam was performed according to the departmental dose-optimization program which includes automated exposure control, adjustment of the mA and/or kV according to patient size and/or use of iterative reconstruction technique. COMPARISON:  None Available. FINDINGS: Alignment: Slight degenerative retrolisthesis is present at C3-4 and C4-5. Slight anterolisthesis is present at C7-T1 T1-2. Skull base and vertebrae: The craniocervical junction is normal. Vertebral body heights are normal. No acute fractures are present. Soft tissues and spinal canal: No prevertebral fluid or swelling. No visible canal hematoma. Disc levels: Multilevel degenerative changes are present. Uncovertebral and facet hypertrophy contribute to right foraminal narrowing at C3-4 and C4-5. Bilateral scratched at moderate bilateral foraminal stenosis is present at C5-6. Upper chest: The lung apices are clear. The thoracic inlet is within normal limits. IMPRESSION: 1. No acute fracture or traumatic subluxation. 2. Multilevel degenerative changes of the cervical spine as described. Electronically Signed   By: Marin Roberts M.D.   On: 02/22/2023 18:28    Assessment/Plan Present on Admission:  Subdural hematoma (HCC) -Repeat CT head ordered for 12:30AM -Goal SBP<160 -neurochecks q2hrs -Patient advised to avoid NSAIDs given subdural hematoma. -Neurosurgery consult placed.  Aware. -Unclear etiology of subdural hematoma   Essential hypertension, benign -Goal SBP less than 160 -Home meds resumed, as needed Lopressor   Hereditary and idiopathic peripheral neuropathy, -On Nucynta  >31yrs, resumed.  Per patient she has had no side effects with this medication.  No grogginess, no altered mentation.  This has been resumed.  Discussion had with patient given the unclear etiology of her  subdural hematoma.  Given the fact she does not remember a fall where she states her head.  She needs to review her medications with her PCP.   Mixed hyperlipidemia -Fenofibrate resumed   Benign paroxysmal positional vertigo -Meclizine as needed resumed  Maurizio Geno 02/22/2023, 9:37 PM

## 2023-02-22 NOTE — ED Notes (Signed)
Pt ambulates to bathroom 1 person assist.

## 2023-02-23 ENCOUNTER — Inpatient Hospital Stay (HOSPITAL_COMMUNITY): Payer: Medicare Other

## 2023-02-23 ENCOUNTER — Other Ambulatory Visit (HOSPITAL_BASED_OUTPATIENT_CLINIC_OR_DEPARTMENT_OTHER): Payer: Self-pay

## 2023-02-23 DIAGNOSIS — I6202 Nontraumatic subacute subdural hemorrhage: Secondary | ICD-10-CM | POA: Diagnosis not present

## 2023-02-23 DIAGNOSIS — S065XAA Traumatic subdural hemorrhage with loss of consciousness status unknown, initial encounter: Principal | ICD-10-CM | POA: Diagnosis present

## 2023-02-23 DIAGNOSIS — I6200 Nontraumatic subdural hemorrhage, unspecified: Secondary | ICD-10-CM | POA: Diagnosis not present

## 2023-02-23 DIAGNOSIS — S065X0A Traumatic subdural hemorrhage without loss of consciousness, initial encounter: Secondary | ICD-10-CM | POA: Diagnosis not present

## 2023-02-23 HISTORY — DX: Traumatic subdural hemorrhage with loss of consciousness status unknown, initial encounter: S06.5XAA

## 2023-02-23 LAB — CBC WITH DIFFERENTIAL/PLATELET
Abs Immature Granulocytes: 0.03 10*3/uL (ref 0.00–0.07)
Basophils Absolute: 0 10*3/uL (ref 0.0–0.1)
Basophils Relative: 1 %
Eosinophils Absolute: 0.2 10*3/uL (ref 0.0–0.5)
Eosinophils Relative: 4 %
HCT: 36.1 % (ref 36.0–46.0)
Hemoglobin: 12.5 g/dL (ref 12.0–15.0)
Immature Granulocytes: 1 %
Lymphocytes Relative: 21 %
Lymphs Abs: 1.1 10*3/uL (ref 0.7–4.0)
MCH: 31.3 pg (ref 26.0–34.0)
MCHC: 34.6 g/dL (ref 30.0–36.0)
MCV: 90.3 fL (ref 80.0–100.0)
Monocytes Absolute: 0.5 10*3/uL (ref 0.1–1.0)
Monocytes Relative: 9 %
Neutro Abs: 3.4 10*3/uL (ref 1.7–7.7)
Neutrophils Relative %: 64 %
Platelets: 128 10*3/uL — ABNORMAL LOW (ref 150–400)
RBC: 4 MIL/uL (ref 3.87–5.11)
RDW: 12.5 % (ref 11.5–15.5)
WBC: 5.3 10*3/uL (ref 4.0–10.5)
nRBC: 0 % (ref 0.0–0.2)

## 2023-02-23 LAB — BASIC METABOLIC PANEL
Anion gap: 7 (ref 5–15)
BUN: 14 mg/dL (ref 8–23)
CO2: 21 mmol/L — ABNORMAL LOW (ref 22–32)
Calcium: 8.7 mg/dL — ABNORMAL LOW (ref 8.9–10.3)
Chloride: 111 mmol/L (ref 98–111)
Creatinine, Ser: 0.96 mg/dL (ref 0.44–1.00)
GFR, Estimated: >60 mL/min (ref >60)
Glucose, Bld: 89 mg/dL (ref 70–99)
Potassium: 3.3 mmol/L — ABNORMAL LOW (ref 3.5–5.1)
Sodium: 139 mmol/L (ref 135–145)

## 2023-02-23 LAB — PROTIME-INR
INR: 1.1 (ref 0.8–1.2)
Prothrombin Time: 14.6 s (ref 11.4–15.2)

## 2023-02-23 MED ORDER — MELOXICAM 15 MG PO TABS: 15.0000 mg | ORAL_TABLET | ORAL | Status: DC

## 2023-02-23 MED ORDER — TRAZODONE HCL 150 MG PO TABS: 150.0000 mg | ORAL_TABLET | Freq: Every evening | ORAL | Status: DC | PRN

## 2023-02-23 MED ORDER — POTASSIUM CHLORIDE ER 10 MEQ PO TBCR: 10.0000 meq | EXTENDED_RELEASE_TABLET | Freq: Two times a day (BID) | ORAL | Status: DC

## 2023-02-23 NOTE — Progress Notes (Signed)
Reviewed DC instructions with patient. All questions answered. Belongings returned, patient leaving with family.

## 2023-02-23 NOTE — Care Management CC44 (Signed)
Condition Code 44 Documentation Completed  Patient Details  Name: Brittany Mercer MRN: [id number] Date of Birth: March 22, 1946   Condition Code 44 given:  Yes Patient signature on Condition Code 44 notice:  Yes Documentation of 2 MD's agreement:  Yes Code 44 added to claim:  Yes    Darrold Span, RN 02/23/2023, 11:05 AM

## 2023-02-23 NOTE — Progress Notes (Signed)
Subjective: Patient reports she is doing much better today  Objective: Vital signs in last 24 hours: Temp:  [97.9 F (36.6 C)-98.8 F (37.1 C)] 98.8 F (37.1 C) (11/29 0742) Pulse Rate:  [62-74] 74 (11/29 0742) Resp:  [12-19] 18 (11/29 0742) BP: (98-205)/(46-92) 104/46 (11/29 0800) SpO2:  [95 %-100 %] 100 % (11/29 0742) Weight:  [71.2 kg-72.1 kg] 72.1 kg (11/28 2100)  Intake/Output from previous day: 11/28 0701 - 11/29 0700 In: 1000.2 [IV Piggyback:1000.2] Out: -  Intake/Output this shift: No intake/output data recorded.  Neurologic: Grossly normal, awake alert moving all extremities and conversant without aphasia, good attention span  Lab Results: Lab Results  Component Value Date   WBC 5.3 02/23/2023   HGB 12.5 02/23/2023   HCT 36.1 02/23/2023   MCV 90.3 02/23/2023   PLT 128 (L) 02/23/2023   Lab Results  Component Value Date   INR 1.1 02/23/2023   BMET Lab Results  Component Value Date   NA 139 02/23/2023   K 3.3 (L) 02/23/2023   CL 111 02/23/2023   CO2 21 (L) 02/23/2023   GLUCOSE 89 02/23/2023   BUN 14 02/23/2023   CREATININE 0.96 02/23/2023   CALCIUM 8.7 (L) 02/23/2023    Studies/Results: CT HEAD WO CONTRAST ( )  Result Date: 02/23/2023 CLINICAL DATA:  Follow-up subdural hematoma. EXAM: CT HEAD WITHOUT CONTRAST TECHNIQUE: Contiguous axial images were obtained from the base of the skull through the vertex without intravenous contrast. RADIATION DOSE REDUCTION: This exam was performed according to the departmental dose-optimization program which includes automated exposure control, adjustment of the mA and/or kV according to patient size and/or use of iterative reconstruction technique. COMPARISON:  Yesterday FINDINGS: Brain: Iso to low-density subdural hematoma along the right frontal convexity is unchanged at up to 5 mm in thickness. No acute hemorrhage. No significant mass effect. No evidence of infarct, hydrocephalus, or brain lesion. Vascular: No  hyperdense vessel or unexpected calcification. Skull: Normal. Negative for fracture or focal lesion. Sinuses/Orbits: No acute finding. IMPRESSION: Unchanged subacute hematoma along the right frontal convexity, 5 mm in maximal thickness. Electronically Signed   By: Tiburcio Pea M.D.   On: 02/23/2023 04:12   CT Head Wo Contrast  Result Date: 02/22/2023 CLINICAL DATA:  Head trauma.  Fell 5 days ago. EXAM: CT HEAD WITHOUT CONTRAST TECHNIQUE: Contiguous axial images were obtained from the base of the skull through the vertex without intravenous contrast. RADIATION DOSE REDUCTION: This exam was performed according to the departmental dose-optimization program which includes automated exposure control, adjustment of the mA and/or kV according to patient size and/or use of iterative reconstruction technique. COMPARISON:  CT head without contrast 03/03/2022 FINDINGS: Brain: A 5 mm isodense extra-axial collection is present over the right convexity compatible with a subacute subdural hematoma. Slight crowding of the sulci is present. Midline shift of 1-2 mm is present. No parenchymal hemorrhage is present. Mild atrophy and white matter changes otherwise stable. Deep brain nuclei are within normal limits. The ventricles are of normal size. The brainstem and cerebellum are within normal limits. Midline structures are within normal limits. Vascular: Atherosclerotic calcifications are present within the cavernous internal carotid arteries bilaterally. No hyperdense vessel is present. Skull: Calvarium is intact. No focal lytic or blastic lesions are present. No significant extracranial soft tissue lesion is present. Sinuses/Orbits: The paranasal sinuses and mastoid air cells are clear. Right lens replacement is noted. Globes and orbits are otherwise within normal limits. IMPRESSION: 1. 5 mm subacute subdural hematoma over the right convexity.  2. Midline shift of 1-2 mm. 3. Stable atrophy and white matter disease. This  likely reflects the sequela of chronic microvascular ischemia. Critical Value/emergent results were called by telephone at the time of interpretation on 02/22/2023 at 6:25 pm to provider DAN FLOYD , who verbally acknowledged these results. Electronically Signed   By: Marin Roberts M.D.   On: 02/22/2023 18:28   CT Cervical Spine Wo Contrast  Result Date: 02/22/2023 CLINICAL DATA:  Fall 5 days ago. Bruising over the left eye and neck. EXAM: CT CERVICAL SPINE WITHOUT CONTRAST TECHNIQUE: Multidetector CT imaging of the cervical spine was performed without intravenous contrast. Multiplanar CT image reconstructions were also generated. RADIATION DOSE REDUCTION: This exam was performed according to the departmental dose-optimization program which includes automated exposure control, adjustment of the mA and/or kV according to patient size and/or use of iterative reconstruction technique. COMPARISON:  None Available. FINDINGS: Alignment: Slight degenerative retrolisthesis is present at C3-4 and C4-5. Slight anterolisthesis is present at C7-T1 T1-2. Skull base and vertebrae: The craniocervical junction is normal. Vertebral body heights are normal. No acute fractures are present. Soft tissues and spinal canal: No prevertebral fluid or swelling. No visible canal hematoma. Disc levels: Multilevel degenerative changes are present. Uncovertebral and facet hypertrophy contribute to right foraminal narrowing at C3-4 and C4-5. Bilateral scratched at moderate bilateral foraminal stenosis is present at C5-6. Upper chest: The lung apices are clear. The thoracic inlet is within normal limits. IMPRESSION: 1. No acute fracture or traumatic subluxation. 2. Multilevel degenerative changes of the cervical spine as described. Electronically Signed   By: Marin Roberts M.D.   On: 02/22/2023 18:28    Assessment/Plan: On the right skim subdural is stable.  She is stable for discharge from our standpoint with follow-up in 1  week with a repeat head CT.  I have answered all of her questions to the best my ability regarding postconcussive headaches and symptoms to look out for.  Estimated body mass index is 23.47 kg/m as calculated from the following:   Height as of this encounter: 5\' 9"  (1.753 m).   Weight as of this encounter: 72.1 kg.    LOS: 1 day    Tia Alert 02/23/2023, 8:12 AM

## 2023-02-23 NOTE — Discharge Summary (Addendum)
Physician Discharge Summary  Brittany Mercer FIE:[id number] DOB: 08/22/1945 DOA: 02/22/2023  PCP: Mechele Claude, MD  Admit date: 02/22/2023 Discharge date: 02/23/2023  Admitted From: Home Disposition: Home  Recommendations for Outpatient Follow-up:  Follow up with PCP in 1 week with repeat CBC/BMP Outpatient follow-up with neurosurgery Follow up in ED if symptoms worsen or new appear   Home Health: No Equipment/Devices: None  Discharge Condition: Stable CODE STATUS: Full Diet recommendation: Heart healthy  Brief/Interim Summary: 77 year old female past medical history of chronic narcotic use, Headache, Hereditary and idiopathic peripheral neuropathy, HLD, HTN, Hypokalemia, vertigo presented with headache after recent fall at home on presentation, she was found to have 5 mm subacute subdural hematoma with 1 to 2 mm midline shift.  Neurosurgery was consulted and recommended conservative treatment.  Repeat CT head without contrast this morning showed unchanged subacute hematoma.  Neurosurgery has cleared the patient for discharge with outpatient follow-up with neurosurgery in 1 week.  She will be discharged home today.  Discharge Diagnoses:   Subdural hematoma -Imaging as above. Repeat CT head without contrast this morning showed unchanged subacute hematoma.  Neurosurgery has cleared the patient for discharge with outpatient follow-up with neurosurgery in 1 week.  She will be discharged home today after she gets evaluated by PT.  Thrombocytopenia -Questionable cause.  Monitor as an outpatient.  Essential hypertension -Continue blood pressure.  Outpatient follow-up with PCP  Hereditary and idiopathic peripheral neuropathy -Apparently has been on Nucynta for many years.  Continue symptom.  Outpatient follow-up with pain management/PCP  Mixed hyperlipidemia -Continue fenofibrate  Hypokalemia -Continue supplementation on discharge  BPPV -Continue meclizine as  needed   Discharge Instructions   Allergies as of 02/23/2023       Reactions   Keflex [cephalexin]    GI, couldn't eat/drink, felt very sick   Statins Other (See Comments)   Myalgia, turns red   Zetia [ezetimibe] Other (See Comments)   Myopathy/redness   Penicillins Rash, Other (See Comments)   Pt not sure if she is allergic to penicillin or sulfa. So she does not take either one.  DID THE REACTION INVOLVE: Swelling of the face/tongue/throat, SOB, or low BP? Unknown Sudden or severe rash/hives, skin peeling, or the inside of the mouth or nose? No Did it require medical treatment? No When did it last happen?      childhood allergy If all above answers are "NO", may proceed with cephalosporin use.   Sulfa Antibiotics Rash, Other (See Comments)   Pt not sure if she is allergic to sulfa or penicillin so she doesn't take either one.         Medication List     TAKE these medications    fenofibrate 160 MG tablet Take 1 tablet (160 mg total) by mouth daily.   meclizine 12.5 MG tablet Commonly known as: ANTIVERT Take 1 tablet (12.5 mg total) by mouth 3 (three) times daily as needed for dizziness.   meloxicam 15 MG tablet Commonly known as: MOBIC Take 1 tablet (15 mg total) by mouth every other day.   metoprolol succinate 50 MG 24 hr tablet Commonly known as: TOPROL-XL Take with or immediately following a meal. What changed:  how much to take how to take this when to take this   Nucynta 100 MG Tabs Generic drug: Tapentadol HCl Take 1 tablet (100 mg total) by mouth 4 (four) times daily. What changed: Another medication with the same name was removed. Continue taking this medication, and follow the directions you  see here.   ondansetron 4 MG disintegrating tablet Commonly known as: ZOFRAN-ODT Take 1 tablet (4 mg total) by mouth every 8 (eight) hours as needed for nausea or vomiting. Take 1 tablet every 8 hours for nausea   potassium chloride 10 MEQ tablet Commonly  known as: KLOR-CON Take 1 tablet (10 mEq total) by mouth 2 (two) times daily.   prednisoLONE acetate 1 % ophthalmic suspension Commonly known as: PRED FORTE Place 1 drop into the right eye daily.   Repatha SureClick 140 MG/ML Soaj Generic drug: Evolocumab INJECT 140 MG INTO THE SKIN EVERY 14 (FOURTEEN) DAYS.   traZODone 150 MG tablet Commonly known as: DESYREL Take 1 tablet (150 mg total) by mouth at bedtime as needed for sleep.        Follow-up Information     Arman Bogus, MD. Schedule an appointment as soon as possible for a visit in 1 week(s).   Specialty: Neurosurgery Contact information: [address] [place] [address] [phone]         Mechele Claude, MD. Schedule an appointment as soon as possible for a visit in 1 week(s).   Specialty: Family Medicine Contact information: [address] [place] [address] [phone]                Allergies  Allergen Reactions   Keflex [Cephalexin]     GI, couldn't eat/drink, felt very sick   Statins Other (See Comments)    Myalgia, turns red   Zetia [Ezetimibe] Other (See Comments)    Myopathy/redness   Penicillins Rash and Other (See Comments)    Pt not sure if she is allergic to penicillin or sulfa. So she does not take either one.  DID THE REACTION INVOLVE: Swelling of the face/tongue/throat, SOB, or low BP? Unknown Sudden or severe rash/hives, skin peeling, or the inside of the mouth or nose? No Did it require medical treatment? No When did it last happen?      childhood allergy If all above answers are "NO", may proceed with cephalosporin use.   Sulfa Antibiotics Rash and Other (See Comments)    Pt not sure if she is allergic to sulfa or penicillin so she doesn't take either one.     Consultations: Neurosurgery   Procedures/Studies: CT HEAD WO CONTRAST ( )  Result Date: 02/23/2023 CLINICAL DATA:  Follow-up subdural hematoma. EXAM: CT HEAD WITHOUT CONTRAST  TECHNIQUE: Contiguous axial images were obtained from the base of the skull through the vertex without intravenous contrast. RADIATION DOSE REDUCTION: This exam was performed according to the departmental dose-optimization program which includes automated exposure control, adjustment of the mA and/or kV according to patient size and/or use of iterative reconstruction technique. COMPARISON:  Yesterday FINDINGS: Brain: Iso to low-density subdural hematoma along the right frontal convexity is unchanged at up to 5 mm in thickness. No acute hemorrhage. No significant mass effect. No evidence of infarct, hydrocephalus, or brain lesion. Vascular: No hyperdense vessel or unexpected calcification. Skull: Normal. Negative for fracture or focal lesion. Sinuses/Orbits: No acute finding. IMPRESSION: Unchanged subacute hematoma along the right frontal convexity, 5 mm in maximal thickness. Electronically Signed   By: Tiburcio Pea M.D.   On: 02/23/2023 04:12   CT Head Wo Contrast  Result Date: 02/22/2023 CLINICAL DATA:  Head trauma.  Fell 5 days ago. EXAM: CT HEAD WITHOUT CONTRAST TECHNIQUE: Contiguous axial images were obtained from the base of the skull through the vertex without intravenous contrast. RADIATION DOSE REDUCTION: This  exam was performed according to the departmental dose-optimization program which includes automated exposure control, adjustment of the mA and/or kV according to patient size and/or use of iterative reconstruction technique. COMPARISON:  CT head without contrast 03/03/2022 FINDINGS: Brain: A 5 mm isodense extra-axial collection is present over the right convexity compatible with a subacute subdural hematoma. Slight crowding of the sulci is present. Midline shift of 1-2 mm is present. No parenchymal hemorrhage is present. Mild atrophy and white matter changes otherwise stable. Deep brain nuclei are within normal limits. The ventricles are of normal size. The brainstem and cerebellum are within  normal limits. Midline structures are within normal limits. Vascular: Atherosclerotic calcifications are present within the cavernous internal carotid arteries bilaterally. No hyperdense vessel is present. Skull: Calvarium is intact. No focal lytic or blastic lesions are present. No significant extracranial soft tissue lesion is present. Sinuses/Orbits: The paranasal sinuses and mastoid air cells are clear. Right lens replacement is noted. Globes and orbits are otherwise within normal limits. IMPRESSION: 1. 5 mm subacute subdural hematoma over the right convexity. 2. Midline shift of 1-2 mm. 3. Stable atrophy and white matter disease. This likely reflects the sequela of chronic microvascular ischemia. Critical Value/emergent results were called by telephone at the time of interpretation on 02/22/2023 at 6:25 pm to provider DAN FLOYD , who verbally acknowledged these results. Electronically Signed   By: Marin Roberts M.D.   On: 02/22/2023 18:28   CT Cervical Spine Wo Contrast  Result Date: 02/22/2023 CLINICAL DATA:  Fall 5 days ago. Bruising over the left eye and neck. EXAM: CT CERVICAL SPINE WITHOUT CONTRAST TECHNIQUE: Multidetector CT imaging of the cervical spine was performed without intravenous contrast. Multiplanar CT image reconstructions were also generated. RADIATION DOSE REDUCTION: This exam was performed according to the departmental dose-optimization program which includes automated exposure control, adjustment of the mA and/or kV according to patient size and/or use of iterative reconstruction technique. COMPARISON:  None Available. FINDINGS: Alignment: Slight degenerative retrolisthesis is present at C3-4 and C4-5. Slight anterolisthesis is present at C7-T1 T1-2. Skull base and vertebrae: The craniocervical junction is normal. Vertebral body heights are normal. No acute fractures are present. Soft tissues and spinal canal: No prevertebral fluid or swelling. No visible canal hematoma. Disc  levels: Multilevel degenerative changes are present. Uncovertebral and facet hypertrophy contribute to right foraminal narrowing at C3-4 and C4-5. Bilateral scratched at moderate bilateral foraminal stenosis is present at C5-6. Upper chest: The lung apices are clear. The thoracic inlet is within normal limits. IMPRESSION: 1. No acute fracture or traumatic subluxation. 2. Multilevel degenerative changes of the cervical spine as described. Electronically Signed   By: Marin Roberts M.D.   On: 02/22/2023 18:28      Subjective: Patient seen and examined at bedside.  Headache is improving.  Feels okay to go home today.  No fever, chest pain, vomiting reported.  Discharge Exam: Vitals:   02/23/23 0800 02/23/23 0940  BP: (!) 104/46 (!) 121/57  Pulse: 72   Resp:    Temp:    SpO2:      General: Pt is alert, awake, not in acute distress.  On room air.  Some bruising over the left side of the face Cardiovascular: rate controlled, S1/S2 + Respiratory: bilateral decreased breath sounds at bases Abdominal: Soft, NT, ND, bowel sounds + Extremities: no edema, no cyanosis    The results of significant diagnostics from this hospitalization (including imaging, microbiology, ancillary and laboratory) are listed below for reference.  Microbiology: No results found for this or any previous visit (from the past 240 hour(s)).   Labs: BNP (last 3 results) No results for input(s): "BNP" in the last 8760 hours. Basic Metabolic Panel: Recent Labs  Lab 02/22/23 1824 02/23/23 0610  NA 142 139  K 3.5 3.3*  CL 107 111  CO2 25 21*  GLUCOSE 88 89  BUN 18 14  CREATININE 0.83 0.96  CALCIUM 9.7 8.7*   Liver Function Tests: No results for input(s): "AST", "ALT", "ALKPHOS", "BILITOT", "PROT", "ALBUMIN" in the last 168 hours. No results for input(s): "LIPASE", "AMYLASE" in the last 168 hours. No results for input(s): "AMMONIA" in the last 168 hours. CBC: Recent Labs  Lab 02/22/23 1824  02/23/23 0610  WBC 5.5 5.3  NEUTROABS 4.0 3.4  HGB 13.7 12.5  HCT 38.3 36.1  MCV 88.7 90.3  PLT 200 128*   Cardiac Enzymes: No results for input(s): "CKTOTAL", "CKMB", "CKMBINDEX", "TROPONINI" in the last 168 hours. BNP: Invalid input(s): "POCBNP" CBG: No results for input(s): "GLUCAP" in the last 168 hours. D-Dimer No results for input(s): "DDIMER" in the last 72 hours. Hgb A1c No results for input(s): "HGBA1C" in the last 72 hours. Lipid Profile No results for input(s): "CHOL", "HDL", "LDLCALC", "TRIG", "CHOLHDL", "LDLDIRECT" in the last 72 hours. Thyroid function studies No results for input(s): "TSH", "T4TOTAL", "T3FREE", "THYROIDAB" in the last 72 hours.  Invalid input(s): "FREET3" Anemia work up No results for input(s): "VITAMINB12", "FOLATE", "FERRITIN", "TIBC", "IRON", "RETICCTPCT" in the last 72 hours. Urinalysis    Component Value Date/Time   COLORURINE YELLOW 10/18/2022 1117   APPEARANCEUR CLEAR 10/18/2022 1117   APPEARANCEUR Clear 10/18/2022 0829   LABSPEC 1.029 10/18/2022 1117   PHURINE 6.0 10/18/2022 1117   GLUCOSEU NEGATIVE 10/18/2022 1117   HGBUR NEGATIVE 10/18/2022 1117   BILIRUBINUR NEGATIVE 10/18/2022 1117   BILIRUBINUR Negative 10/18/2022 0829   KETONESUR NEGATIVE 10/18/2022 1117   PROTEINUR NEGATIVE 10/18/2022 1117   PROTEINUR Negative 10/18/2022 0829   UROBILINOGEN 1.0 01/18/2015 1025   NITRITE NEGATIVE 10/18/2022 1117   NITRITE Negative 10/18/2022 0829   LEUKOCYTESUR NEGATIVE 10/18/2022 1117   LEUKOCYTESUR Negative 10/18/2022 0829   Sepsis Labs Recent Labs  Lab 02/22/23 1824 02/23/23 0610  WBC 5.5 5.3   Microbiology No results found for this or any previous visit (from the past 240 hour(s)).   Time coordinating discharge: 35 minutes  SIGNED:   Glade Lloyd, MD  [facility] 02/23/2023, 10:09 AM

## 2023-02-23 NOTE — TOC Transition Note (Signed)
Transition of Care (TOC) - CM/SW Discharge Note Donn Pierini RN,BSN Transitions of Care Unit 4NP (Non Trauma)- RN Case Manager See Treatment Team for direct Phone #   Patient Details  Name: Brittany Mercer MRN: [id number] Date of Birth: 02/23/1946  Transition of Care [facility]) CM/SW Contact:  Darrold Span, RN Phone Number: 02/23/2023, 11:49 AM   Clinical Narrative:    Pt stable for transition home today, Cm spoke with pt at bedside- per pt she has family coming from [place] to assist her and will transport her home.  Pt reports she has RW and canes at home- no new DME needs noted.   Per PT eval no HH recommendations made at this time, pt will f/u with PCP for outpt needs.   No TOC needs noted for discharge.    Final next level of care: Home/Self Care Barriers to Discharge: No Barriers Identified   Patient Goals and CMS Choice   Choice offered to / list presented to : NA  Discharge Placement               Home          Discharge Plan and Services Additional resources added to the After Visit Summary for   In-house Referral: NA Discharge Planning Services: NA Post Acute Care Choice: NA          DME Arranged: N/A DME Agency: NA       HH Arranged: NA HH Agency: NA        Social Determinants of Health (SDOH) Interventions SDOH Screenings   Food Insecurity: No Food Insecurity (02/22/2023)  Housing: Low Risk  (02/22/2023)  Transportation Needs: No Transportation Needs (02/22/2023)  Utilities: Not At Risk (02/22/2023)  Alcohol Screen: Low Risk  (07/21/2022)  Depression (PHQ2-9): Low Risk  (12/07/2022)  Recent Concern: Depression (PHQ2-9) - High Risk (11/09/2022)  Financial Resource Strain: Low Risk  (07/21/2022)  Physical Activity: Insufficiently Active (07/21/2022)  Social Connections: Moderately Integrated (07/21/2022)  Stress: No Stress Concern Present (07/21/2022)  Tobacco Use: Medium Risk (02/22/2023)     Readmission Risk  Interventions    02/23/2023   11:49 AM  Readmission Risk Prevention Plan  Post Dischage Appt Complete  Medication Screening Complete  Transportation Screening Complete

## 2023-02-23 NOTE — Care Management Obs Status (Signed)
MEDICARE OBSERVATION STATUS NOTIFICATION   Patient Details  Name: Brittany Mercer MRN: [id number] Date of Birth: [birth year]   Medicare Observation Status Notification Given:  Yes    Darrold Span, RN 02/23/2023, 11:05 AM

## 2023-02-23 NOTE — Evaluation (Signed)
Physical Therapy Evaluation Patient Details Name: Brittany Mercer MRN: [id number] DOB: Jul 09, 1945 Today's Date: 02/23/2023  History of Present Illness  Pt is a 77 y/o female admitted 11/28 with fall in conjunction with episode of vertigo with resultant worsening HA.  CT showed SDH with 1-2 mm midline shift. PMHx: chronic narcotic use, HA, Peripheral neuropathy, HTN, OA  L knee  Clinical Impression  Pt is at or close to baseline functioning and should be safe at home with PRN assist. There are no further acute PT needs.  Will sign off at this time.         If plan is discharge home, recommend the following: Other (comment) (prn assist from family)   Can travel by private vehicle        Equipment Recommendations None recommended by PT  Recommendations for Other Services       Functional Status Assessment Patient has had a recent decline in their functional status and demonstrates the ability to make significant improvements in function in a reasonable and predictable amount of time.     Precautions / Restrictions Precautions Precautions: Fall Restrictions Weight Bearing Restrictions: No      Mobility  Bed Mobility Overal bed mobility: Modified Independent                  Transfers Overall transfer level: Modified independent                 General transfer comment: used UE's appropriately for safety    Ambulation/Gait Ambulation/Gait assistance: Supervision, Modified independent (Device/Increase time) Gait Distance (Feet): 400 Feet Assistive device: Straight cane Gait Pattern/deviations: Step-through pattern   Gait velocity interpretation: 1.31 - 2.62 ft/sec, indicative of limited community ambulator   General Gait Details: steady, but slow gait with safe use of her cane.  Stairs Stairs: Yes Stairs assistance: Supervision Stair Management: One rail Right, Alternating pattern, Forwards Number of Stairs: 4 General stair comments: safe  with use of the rail  Wheelchair Mobility     Tilt Bed    Modified Rankin (Stroke Patients Only)       Balance Overall balance assessment: Needs assistance Sitting-balance support: No upper extremity supported, Feet supported, Feet unsupported Sitting balance-Leahy Scale: Good     Standing balance support: No upper extremity supported, Single extremity supported, During functional activity Standing balance-Leahy Scale: Fair Standing balance comment: uses cane for stability due to idiopathic peripheral neuropathy                             Pertinent Vitals/Pain Pain Assessment Pain Assessment: Faces Faces Pain Scale: Hurts little more Pain Location: head Pain Descriptors / Indicators: Aching Pain Intervention(s): Monitored during session, Premedicated before session    Home Living Family/patient expects to be discharged to:: Private residence Living Arrangements: Alone Available Help at Discharge: Family;Friend(s);Available PRN/intermittently (brother/sis in law coming today to help for a period.) Type of Home: House Home Access: Level entry       Home Layout: One level Home Equipment: Grab bars - toilet;Grab bars - tub/shower;Shower seat;Cane - single point      Prior Function Prior Level of Function : Independent/Modified Independent                     Extremity/Trunk Assessment   Upper Extremity Assessment Upper Extremity Assessment: Overall WFL for tasks assessed    Lower Extremity Assessment Lower Extremity Assessment: Overall WFL for tasks assessed  Cervical / Trunk Assessment Cervical / Trunk Assessment: Normal  Communication   Communication Communication: No apparent difficulties  Cognition Arousal: Alert Behavior During Therapy: WFL for tasks assessed/performed Overall Cognitive Status: Within Functional Limits for tasks assessed                                          General Comments General  comments (skin integrity, edema, etc.): Discussed that if vertigo like BPPV returns, pt can get it dx'd and fixed at therapy.  Not appropriate to check at this time and pt not experiencing spinning vertigo at this time.    Exercises     Assessment/Plan    PT Assessment Patient does not need any further PT services  PT Problem List         PT Treatment Interventions      PT Goals (Current goals can be found in the Care Plan section)  Acute Rehab PT Goals Patient Stated Goal: home today PT Goal Formulation: All assessment and education complete, DC therapy    Frequency       Co-evaluation               AM-PAC PT "6 Clicks" Mobility  Outcome Measure Help needed turning from your back to your side while in a flat bed without using bedrails?: None Help needed moving from lying on your back to sitting on the side of a flat bed without using bedrails?: None Help needed moving to and from a bed to a chair (including a wheelchair)?: A Little Help needed standing up from a chair using your arms (e.g., wheelchair or bedside chair)?: A Little Help needed to walk in hospital room?: A Little Help needed climbing 3-5 steps with a railing? : A Little 6 Click Score: 20    End of Session   Activity Tolerance: Patient tolerated treatment well Patient left: in bed;with call bell/phone within reach Nurse Communication: Mobility status PT Visit Diagnosis: Other abnormalities of gait and mobility (R26.89);Other symptoms and signs involving the nervous system (R29.898)    Time: 0981-1914 PT Time Calculation (min) (ACUTE ONLY): 36 min   Charges:   PT Evaluation $PT Eval Low Complexity: 1 Low PT Treatments $Gait Training: 8-22 mins PT General Charges $$ ACUTE PT VISIT: 1 Visit         02/23/2023  Jacinto Halim., PT Acute Rehabilitation Services [phone]  (office)  Eliseo Gum Zahari Fazzino 02/23/2023, 11:40 AM

## 2023-02-24 ENCOUNTER — Other Ambulatory Visit: Payer: Self-pay | Admitting: Family Medicine

## 2023-02-24 ENCOUNTER — Other Ambulatory Visit (HOSPITAL_BASED_OUTPATIENT_CLINIC_OR_DEPARTMENT_OTHER): Payer: Self-pay

## 2023-02-24 DIAGNOSIS — E876 Hypokalemia: Secondary | ICD-10-CM

## 2023-02-24 MED ORDER — POTASSIUM CHLORIDE ER 10 MEQ PO TBCR
10.0000 meq | EXTENDED_RELEASE_TABLET | Freq: Two times a day (BID) | ORAL | 0 refills | Status: DC
  Filled 2023-02-24: qty 180, 90d supply, fill #0

## 2023-02-26 ENCOUNTER — Other Ambulatory Visit: Payer: Self-pay | Admitting: Family Medicine

## 2023-02-26 DIAGNOSIS — G609 Hereditary and idiopathic neuropathy, unspecified: Secondary | ICD-10-CM

## 2023-02-26 NOTE — Telephone Encounter (Signed)
Last refill 02/04/23, #120, no refills Last office visit 12/07/22

## 2023-03-01 ENCOUNTER — Other Ambulatory Visit (HOSPITAL_BASED_OUTPATIENT_CLINIC_OR_DEPARTMENT_OTHER): Payer: Self-pay

## 2023-03-03 ENCOUNTER — Other Ambulatory Visit: Payer: Self-pay | Admitting: Family Medicine

## 2023-03-03 DIAGNOSIS — G609 Hereditary and idiopathic neuropathy, unspecified: Secondary | ICD-10-CM

## 2023-03-06 ENCOUNTER — Other Ambulatory Visit (HOSPITAL_COMMUNITY): Payer: Self-pay | Admitting: Student

## 2023-03-06 DIAGNOSIS — S065XAA Traumatic subdural hemorrhage with loss of consciousness status unknown, initial encounter: Secondary | ICD-10-CM

## 2023-03-07 ENCOUNTER — Other Ambulatory Visit: Payer: Self-pay | Admitting: Family Medicine

## 2023-03-07 ENCOUNTER — Other Ambulatory Visit (HOSPITAL_BASED_OUTPATIENT_CLINIC_OR_DEPARTMENT_OTHER): Payer: Self-pay

## 2023-03-07 ENCOUNTER — Encounter (HOSPITAL_BASED_OUTPATIENT_CLINIC_OR_DEPARTMENT_OTHER): Payer: Self-pay

## 2023-03-07 DIAGNOSIS — G609 Hereditary and idiopathic neuropathy, unspecified: Secondary | ICD-10-CM

## 2023-03-07 NOTE — Telephone Encounter (Signed)
Pt aware that Dr Darlyn Read has denied refill on this medication but wants to know why and wants to know if he can give a refill to last her until her appt with him on 04/10/23?

## 2023-03-08 ENCOUNTER — Other Ambulatory Visit (HOSPITAL_BASED_OUTPATIENT_CLINIC_OR_DEPARTMENT_OTHER): Payer: Self-pay

## 2023-03-08 NOTE — Telephone Encounter (Signed)
TC to patient, the reason for denial is that it is a controlled medication that has to be filled during a visit and at her last visit in Sept PCP said he wanted to see her back in a month.

## 2023-03-09 ENCOUNTER — Encounter (HOSPITAL_BASED_OUTPATIENT_CLINIC_OR_DEPARTMENT_OTHER): Payer: Self-pay

## 2023-03-09 ENCOUNTER — Other Ambulatory Visit (HOSPITAL_BASED_OUTPATIENT_CLINIC_OR_DEPARTMENT_OTHER): Payer: Self-pay

## 2023-03-10 ENCOUNTER — Other Ambulatory Visit: Payer: Self-pay | Admitting: Family Medicine

## 2023-03-10 DIAGNOSIS — E782 Mixed hyperlipidemia: Secondary | ICD-10-CM

## 2023-03-12 ENCOUNTER — Encounter: Payer: Self-pay | Admitting: Family Medicine

## 2023-03-12 ENCOUNTER — Other Ambulatory Visit (HOSPITAL_BASED_OUTPATIENT_CLINIC_OR_DEPARTMENT_OTHER): Payer: Self-pay

## 2023-03-12 ENCOUNTER — Ambulatory Visit: Payer: Medicare Other | Admitting: Family Medicine

## 2023-03-12 VITALS — BP 145/88 | HR 69 | Temp 97.9°F | Ht 69.0 in | Wt 159.4 lb

## 2023-03-12 DIAGNOSIS — E782 Mixed hyperlipidemia: Secondary | ICD-10-CM | POA: Diagnosis not present

## 2023-03-12 DIAGNOSIS — S060X9A Concussion with loss of consciousness of unspecified duration, initial encounter: Secondary | ICD-10-CM

## 2023-03-12 DIAGNOSIS — K3184 Gastroparesis: Secondary | ICD-10-CM

## 2023-03-12 DIAGNOSIS — G609 Hereditary and idiopathic neuropathy, unspecified: Secondary | ICD-10-CM

## 2023-03-12 DIAGNOSIS — S065XAS Traumatic subdural hemorrhage with loss of consciousness status unknown, sequela: Secondary | ICD-10-CM | POA: Diagnosis not present

## 2023-03-12 DIAGNOSIS — S065XAA Traumatic subdural hemorrhage with loss of consciousness status unknown, initial encounter: Secondary | ICD-10-CM

## 2023-03-12 MED ORDER — NUCYNTA 100 MG PO TABS: 100.0000 mg | ORAL_TABLET | Freq: Four times a day (QID) | ORAL | 0 refills | Status: DC

## 2023-03-12 MED ORDER — REPATHA SURECLICK 140 MG/ML ~~LOC~~ SOAJ
140.0000 mg | SUBCUTANEOUS | 2 refills | Status: DC
  Filled 2023-03-12: qty 2, 28d supply, fill #0
  Filled 2023-04-30 – 2023-05-02 (×2): qty 2, 28d supply, fill #1

## 2023-03-12 MED ORDER — NUCYNTA 100 MG PO TABS
100.0000 mg | ORAL_TABLET | Freq: Four times a day (QID) | ORAL | 0 refills | Status: DC
  Filled 2023-03-12: qty 120, 30d supply, fill #0

## 2023-03-12 NOTE — Progress Notes (Signed)
Subjective:  Patient ID: Brittany Mercer, female    DOB: 05-Jan-1946  Age: 77 y.o. MRN: [id number]  CC: Hospitalization Follow-up (Concussion and brain bleed)   HPI [name] presents for recent fall. Stumbled and sat down hard.  Three days later developed bruising of scalp, left face. Dx with concussion, brain bleed. Denies head injury.  However she cannot remember any type of incident or injury that could explain the concussion and the subdural hematoma.  This is when she was noted to have bruising at the left periorbital and cheek area as well as having had bumps on left side of head and crown. BP was over 200 systolic with brain bleed. In bed for 3 weeks due to back pain. No energy for any activity.  It has now been 3 months and 5 days since she was last here for pain management for her neuropathy of the legs.  She has a significant amount of headache and back pain in addition to the neuropathy pain as well.  The neuropathy pain is stable currently.  She also notes that she has lost 5 more pounds and is due for follow-up soon with the GI doctor working with her.     03/12/2023    2:27 PM 03/12/2023    2:20 PM 12/07/2022    9:46 AM  Depression screen PHQ 2/9  Decreased Interest 0 0 0  Down, Depressed, Hopeless 0 0 0  PHQ - 2 Score 0 0 0  Altered sleeping 1    Tired, decreased energy 3    Change in appetite 1    Feeling bad or failure about yourself  0    Trouble concentrating 2    Moving slowly or fidgety/restless 2    Suicidal thoughts 0    PHQ-9 Score 9    Difficult doing work/chores Somewhat difficult      History [name] has a past medical history of Allergy, Cataract, Chronic narcotic use, Headache, Hereditary and idiopathic peripheral neuropathy, Hyperlipidemia, Hypertension, Hypokalemia, OA (osteoarthritis), and Peripheral neuropathy.   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   Her family history includes Arthritis in her brother; Dementia in her father; Heart disease in her mother; Hyperlipidemia in her mother; Hypertension in her brother, father, and mother; Neuropathy in her father; Stroke in her mother.She reports that she quit smoking about 24 years ago. Her smoking use included cigarettes. She started smoking about 54 years ago. She has a 15 pack-year smoking history. She has never used smokeless tobacco. She reports current alcohol use of about 4.0 standard drinks of alcohol per week. She reports that she does not use drugs.    ROS Review of Systems  Objective:  BP (!) 145/88   Pulse 69   Temp 97.9 F (36.6 C)   Ht 5\' 9"  (1.753 m)   Wt 159 lb 6.4 oz (72.3 kg)   SpO2 99%   BMI 23.54 kg/m   BP Readings from Last 3 Encounters:  03/12/23 (!) 145/88  02/23/23 (!) 148/76  12/07/22 (!) 159/82    Wt Readings from Last 3 Encounters:  03/12/23 159 lb 6.4 oz (72.3 kg)  02/22/23 158 lb 15.2  oz (72.1 kg)  12/07/22 160 lb 12.8 oz (72.9 kg)     Physical Exam    Assessment & Plan:   Jolaine Hambric" was seen today for hospitalization follow-up.  Diagnoses and all orders for this visit:  Subdural hematoma (HCC)  Idiopathic peripheral neuropathy -     Tapentadol HCl (NUCYNTA) 100 MG TABS; Take 1 tablet (100 mg total) by mouth 4 (four) times daily. -     Tapentadol HCl (NUCYNTA) 100 MG TABS; Take 1 tablet (100 mg total) by mouth 4 (four) times daily. -     Tapentadol HCl (NUCYNTA) 100 MG TABS; Take 1 tablet (100 mg total) by mouth 4 (four) times daily.  Mixed hyperlipidemia -     Evolocumab (REPATHA SURECLICK) 140  MG/ML SOAJ; Inject 140 mg into the skin every 14 (fourteen) days.  Concussion with loss of consciousness, initial encounter  Nondiabetic gastroparesis       I am having [name]. [employer]" start on Nucynta and Nucynta. I am also having her maintain her fenofibrate, metoprolol succinate, ondansetron, prednisoLONE acetate, meclizine, meloxicam, traZODone, potassium chloride, Nucynta, and Repatha SureClick.  Allergies as of 03/12/2023       Reactions   Keflex [cephalexin]    GI, couldn't eat/drink, felt very sick   Statins Other (See Comments)   Myalgia, turns red   Zetia [ezetimibe] Other (See Comments)   Myopathy/redness   Penicillins Rash, Other (See Comments)   Pt not sure if she is allergic to penicillin or sulfa. So she does not take either one.  DID THE REACTION INVOLVE: Swelling of the face/tongue/throat, SOB, or low BP? Unknown Sudden or severe rash/hives, skin peeling, or the inside of the mouth or nose? No Did it require medical treatment? No When did it last happen?      childhood allergy If all above answers are "NO", may proceed with cephalosporin use.   Sulfa Antibiotics Rash, Other (See Comments)   Pt not sure if she is allergic to sulfa or penicillin so she doesn't take either one.         Medication List        Accurate as of March 12, 2023  5:51 PM. If you have any questions, ask your nurse or doctor.          fenofibrate 160 MG tablet Take 1 tablet (160 mg total) by mouth daily.   meclizine 12.5 MG tablet Commonly known as: ANTIVERT Take 1 tablet (12.5 mg total) by mouth 3 (three) times daily as needed for dizziness.   meloxicam 15 MG tablet Commonly known as: MOBIC Take 1 tablet (15 mg total) by mouth every other day.   metoprolol succinate 50 MG 24 hr tablet Commonly known as: TOPROL-XL Take with or immediately following a meal. What changed:  how much to take how to take this when to take this   Nucynta 100 MG  Tabs Generic drug: Tapentadol HCl Take 1 tablet (100 mg total) by mouth 4 (four) times daily. What changed: Another medication with the same name was added. Make sure you understand how and when to take each. Changed by: Lashina Milles   Nucynta 100 MG Tabs Generic drug: Tapentadol HCl Take 1 tablet (100 mg total) by mouth 4 (four) times daily. Start taking on: April 11, 2023 What changed: You were already taking a medication with the same name, and this prescription was added. Make sure you understand how and when to take each. Changed by: [employer]  Nucynta 100 MG Tabs Generic drug: Tapentadol HCl Take 1 tablet (100 mg total) by mouth 4 (four) times daily. Start taking on: May 11, 2023 What changed: You were already taking a medication with the same name, and this prescription was added. Make sure you understand how and when to take each. Changed by: Carder Yin   ondansetron 4 MG disintegrating tablet Commonly known as: ZOFRAN-ODT Take 1 tablet (4 mg total) by mouth every 8 (eight) hours as needed for nausea or vomiting. Take 1 tablet every 8 hours for nausea   potassium chloride 10 MEQ tablet Commonly known as: KLOR-CON Take 1 tablet (10 mEq total) by mouth 2 (two) times daily.   prednisoLONE acetate 1 % ophthalmic suspension Commonly known as: PRED FORTE Place 1 drop into the right eye daily.   Repatha SureClick 140 MG/ML Soaj Generic drug: Evolocumab Inject 140 mg into the skin every 14 (fourteen) days.   traZODone 150 MG tablet Commonly known as: DESYREL Take 1 tablet (150 mg total) by mouth at bedtime as needed for sleep.      I believe the only applicable explanation for the unusual presentation is that she had a syncopal episode which she does not remember.  This is probably including a fall and blow to the head that could explain the loss of consciousness and lack of memory of of why she had bruising and a blow was significant enough to cause a  subdural hematoma.  I believe this may have been the result of weakness caused by her weight loss that in turn is likely to be related to GI concerns such as gastroparesis.  She has been undergoing workup first by me and now by GI to determine the cause of her weight loss.   Follow-up: Return in about 1 month (around 04/12/2023).  Mechele Claude, M.D.

## 2023-03-15 DIAGNOSIS — M47896 Other spondylosis, lumbar region: Secondary | ICD-10-CM | POA: Diagnosis not present

## 2023-03-16 ENCOUNTER — Ambulatory Visit (HOSPITAL_BASED_OUTPATIENT_CLINIC_OR_DEPARTMENT_OTHER): Payer: Medicare Other

## 2023-03-17 DIAGNOSIS — M545 Low back pain, unspecified: Secondary | ICD-10-CM | POA: Diagnosis not present

## 2023-03-26 ENCOUNTER — Ambulatory Visit (HOSPITAL_BASED_OUTPATIENT_CLINIC_OR_DEPARTMENT_OTHER)
Admission: RE | Admit: 2023-03-26 | Discharge: 2023-03-26 | Disposition: A | Discharge status: Home / Self Care | Payer: Medicare Other | Source: Ambulatory Visit | Attending: Student | Admitting: Student

## 2023-03-26 DIAGNOSIS — S060X0D Concussion without loss of consciousness, subsequent encounter: Secondary | ICD-10-CM | POA: Diagnosis not present

## 2023-03-26 DIAGNOSIS — S065XAA Traumatic subdural hemorrhage with loss of consciousness status unknown, initial encounter: Secondary | ICD-10-CM | POA: Insufficient documentation

## 2023-04-03 ENCOUNTER — Encounter: Payer: Self-pay | Admitting: Family Medicine

## 2023-04-03 ENCOUNTER — Ambulatory Visit: Payer: Medicare Other | Admitting: Family Medicine

## 2023-04-03 DIAGNOSIS — S065XAA Traumatic subdural hemorrhage with loss of consciousness status unknown, initial encounter: Secondary | ICD-10-CM | POA: Diagnosis not present

## 2023-04-03 DIAGNOSIS — G609 Hereditary and idiopathic neuropathy, unspecified: Secondary | ICD-10-CM | POA: Diagnosis not present

## 2023-04-03 MED ORDER — NUCYNTA 100 MG PO TABS: 100.0000 mg | ORAL_TABLET | Freq: Four times a day (QID) | ORAL | 0 refills | Status: DC

## 2023-04-03 NOTE — Progress Notes (Signed)
 Subjective:  Patient ID: Brittany Mercer, female    DOB: 20-Dec-1945  Age: 78 y.o. MRN: [id number]  CC:  Idiopathic peripheral neuropathy  (1 month follow up- started on nucynta  )   HPI Brittany Mercer presents for recheck of her pain meds. Was out for 4 days due to pharmacy not honoring postdated prescriptions. Wants to switch back to [facility].      04/03/2023   11:25 AM 03/12/2023    2:27 PM 03/12/2023    2:20 PM  Depression screen PHQ 2/9  Decreased Interest 0 0 0  Down, Depressed, Hopeless 0 0 0  PHQ - 2 Score 0 0 0  Altered sleeping 1 1   Tired, decreased energy 1 3   Change in appetite 0 1   Feeling bad or failure about yourself  0 0   Trouble concentrating 0 2   Moving slowly or fidgety/restless 0 2   Suicidal thoughts 0 0   PHQ-9 Score 2 9   Difficult doing work/chores Not difficult at all Somewhat difficult     History Makinna has a past medical history of Allergy, Cataract, Chronic narcotic use, Headache, Hereditary and idiopathic peripheral neuropathy, Hyperlipidemia, Hypertension, Hypokalemia, OA (osteoarthritis), and Peripheral neuropathy.   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   Her family history includes Arthritis in her brother; Dementia in her father; Heart disease in her mother; Hyperlipidemia in her mother; Hypertension in her brother, father, and mother; Neuropathy in her father; Stroke in her mother.She reports that she quit smoking about 24 years ago. Her smoking use included cigarettes. She started smoking about 54 years ago.  She has a 15 pack-year smoking history. She has never used smokeless tobacco. She reports current alcohol use of about 4.0 standard drinks of alcohol per week. She reports that she does not use drugs.    ROS Review of Systems  Constitutional: Negative.   HENT: Negative.    Eyes:  Negative for visual disturbance.  Respiratory:  Negative for shortness of breath.   Cardiovascular:  Negative for chest pain.  Gastrointestinal:  Negative for abdominal pain.  Musculoskeletal:  Negative for arthralgias.    Objective:  BP (!) 153/87   Pulse 76   Temp 97.7 F (36.5 C)   Ht 5' 9 (1.753 m)   Wt 158 lb 6.4 oz (71.8 kg)   SpO2 99%   BMI 23.39 kg/m   BP Readings from Last 3 Encounters:  04/03/23 (!) 153/87  03/12/23 (!) 145/88  02/23/23 (!) 148/76    Wt Readings from Last 3 Encounters:  04/03/23 158 lb 6.4 oz (71.8 kg)  03/12/23 159 lb 6.4 oz (72.3 kg)  02/22/23 158 lb 15.2 oz (72.1 kg)     Physical Exam Constitutional:      General: She is not in acute distress.    Appearance: She is well-developed.  Cardiovascular:     Rate and Rhythm: Normal rate and regular rhythm.  Pulmonary:     Breath sounds: Normal breath sounds.  Musculoskeletal:        General: Normal range of motion.  Skin:    General: Skin is warm and dry.  Neurological:  Mental Status: She is alert and oriented to person, place, and time.       Assessment & Plan:   Anouk Critzer was seen today for  idiopathic peripheral neuropathy .  Diagnoses and all orders for this visit:  Idiopathic peripheral neuropathy -     Tapentadol  HCl (NUCYNTA ) 100 MG TABS; Take 1 tablet (100 mg total) by mouth 4 (four) times daily. -     Tapentadol  HCl (NUCYNTA ) 100 MG TABS; Take 1 tablet (100 mg total) by mouth 4 (four) times daily. -     Tapentadol  HCl (NUCYNTA ) 100 MG TABS; Take 1 tablet (100 mg total) by mouth 4 (four) times daily.       I am having Genavive Kubicki. Oley [place] maintain her fenofibrate ,  metoprolol  succinate, ondansetron , prednisoLONE  acetate, meclizine , meloxicam , traZODone , potassium chloride , Repatha  SureClick, Nucynta , Nucynta , and Nucynta .  Allergies as of 04/03/2023       Reactions   Keflex  [cephalexin ]    GI, couldn't eat/drink, felt very sick   Misc. Sulfonamide Containing Compounds    Statins Other (See Comments)   Myalgia, turns red   Zetia  [ezetimibe ] Other (See Comments)   Myopathy/redness   Other Rash   Penicillins Rash, Other (See Comments)   Pt not sure if she is allergic to penicillin or sulfa. So she does not take either one.  DID THE REACTION INVOLVE: Swelling of the face/tongue/throat, SOB, or low BP? Unknown Sudden or severe rash/hives, skin peeling, or the inside of the mouth or nose? No Did it require medical treatment? No When did it last happen?      childhood allergy If all above answers are "NO", may proceed with cephalosporin use.   Sulfa Antibiotics Rash, Other (See Comments)   Pt not sure if she is allergic to sulfa or penicillin so she doesn't take either one.         Medication List        Accurate as of April 03, 2023  8:50 PM. If you have any questions, ask your nurse or doctor.          fenofibrate  160 MG tablet Take 1 tablet (160 mg total) by mouth daily.   meclizine  12.5 MG tablet Commonly known as: ANTIVERT  Take 1 tablet (12.5 mg total) by mouth 3 (three) times daily as needed for dizziness.   meloxicam  15 MG tablet Commonly known as: MOBIC  Take 1 tablet (15 mg total) by mouth every other day.   metoprolol  succinate 50 MG 24 hr tablet Commonly known as: TOPROL -XL Take with or immediately following a meal. What changed:  how much to take how to take this when to take this   Nucynta  100 MG Tabs Generic drug: Tapentadol  HCl Take 1 tablet (100 mg total) by mouth 4 (four) times daily. Start taking on: April 11, 2023 What changed: These instructions start on April 11, 2023. If you are unsure what to do until  then, ask your doctor or other care provider. Changed by: Kermit Arnette   Nucynta  100 MG Tabs Generic drug: Tapentadol  HCl Take 1 tablet (100 mg total) by mouth 4 (four) times daily. Start taking on: May 11, 2023 What changed: These instructions start on May 11, 2023. If you are unsure what to do until then, ask your doctor or other care provider. Changed by: Keigen Caddell   Nucynta  100 MG Tabs Generic drug: Tapentadol  HCl Take 1 tablet (100 mg total) by mouth 4 (four) times daily. Start taking on: June 09, 2023  What changed: These instructions start on June 09, 2023. If you are unsure what to do until then, ask your doctor or other care provider. Changed by: Brycelyn Gambino   ondansetron  4 MG disintegrating tablet Commonly known as: ZOFRAN -ODT Take 1 tablet (4 mg total) by mouth every 8 (eight) hours as needed for nausea or vomiting. Take 1 tablet every 8 hours for nausea   potassium chloride  10 MEQ tablet Commonly known as: KLOR-CON  Take 1 tablet (10 mEq total) by mouth 2 (two) times daily.   prednisoLONE  acetate 1 % ophthalmic suspension Commonly known as: PRED FORTE  Place 1 drop into the right eye daily.   Repatha  SureClick 140 MG/ML Soaj Generic drug: Evolocumab  Inject 140 mg into the skin every 14 (fourteen) days.   traZODone  150 MG tablet Commonly known as: DESYREL  Take 1 tablet (150 mg total) by mouth at bedtime as needed for sleep.         Follow-up: Return in about 3 months (around 07/02/2023) for Pain.  Butler Der, M.D.

## 2023-04-09 ENCOUNTER — Ambulatory Visit: Payer: Medicare Other | Admitting: Family Medicine

## 2023-04-10 ENCOUNTER — Other Ambulatory Visit (HOSPITAL_BASED_OUTPATIENT_CLINIC_OR_DEPARTMENT_OTHER): Payer: Self-pay

## 2023-04-10 ENCOUNTER — Encounter: Payer: Self-pay | Admitting: Family Medicine

## 2023-04-10 ENCOUNTER — Inpatient Hospital Stay: Payer: Medicare Other | Admitting: Family Medicine

## 2023-04-11 ENCOUNTER — Other Ambulatory Visit: Payer: Self-pay | Admitting: Family Medicine

## 2023-04-11 DIAGNOSIS — G609 Hereditary and idiopathic neuropathy, unspecified: Secondary | ICD-10-CM

## 2023-04-11 MED ORDER — NUCYNTA 100 MG PO TABS: 100.0000 mg | ORAL_TABLET | Freq: Four times a day (QID) | ORAL | 0 refills | Status: DC

## 2023-04-16 ENCOUNTER — Other Ambulatory Visit: Payer: Self-pay | Admitting: Family Medicine

## 2023-04-16 DIAGNOSIS — G609 Hereditary and idiopathic neuropathy, unspecified: Secondary | ICD-10-CM

## 2023-04-16 MED ORDER — NUCYNTA 100 MG PO TABS: 100.0000 mg | ORAL_TABLET | Freq: Four times a day (QID) | ORAL | 0 refills | Status: DC

## 2023-04-19 ENCOUNTER — Ambulatory Visit: Payer: Medicare Other | Admitting: Family Medicine

## 2023-04-23 DIAGNOSIS — M545 Low back pain, unspecified: Secondary | ICD-10-CM | POA: Diagnosis not present

## 2023-04-23 DIAGNOSIS — M47896 Other spondylosis, lumbar region: Secondary | ICD-10-CM | POA: Diagnosis not present

## 2023-04-30 ENCOUNTER — Other Ambulatory Visit (HOSPITAL_BASED_OUTPATIENT_CLINIC_OR_DEPARTMENT_OTHER): Payer: Self-pay

## 2023-05-01 ENCOUNTER — Other Ambulatory Visit: Payer: Self-pay

## 2023-05-01 ENCOUNTER — Other Ambulatory Visit (HOSPITAL_BASED_OUTPATIENT_CLINIC_OR_DEPARTMENT_OTHER): Payer: Self-pay

## 2023-05-02 ENCOUNTER — Other Ambulatory Visit (HOSPITAL_BASED_OUTPATIENT_CLINIC_OR_DEPARTMENT_OTHER): Payer: Self-pay

## 2023-05-04 ENCOUNTER — Other Ambulatory Visit (HOSPITAL_COMMUNITY): Payer: Self-pay

## 2023-05-10 ENCOUNTER — Ambulatory Visit: Payer: Medicare Other | Admitting: Gastroenterology

## 2023-05-11 ENCOUNTER — Other Ambulatory Visit (HOSPITAL_COMMUNITY): Payer: Self-pay

## 2023-05-16 ENCOUNTER — Other Ambulatory Visit (HOSPITAL_BASED_OUTPATIENT_CLINIC_OR_DEPARTMENT_OTHER): Payer: Self-pay

## 2023-05-23 ENCOUNTER — Other Ambulatory Visit: Payer: Self-pay | Admitting: Family Medicine

## 2023-05-23 ENCOUNTER — Other Ambulatory Visit (HOSPITAL_BASED_OUTPATIENT_CLINIC_OR_DEPARTMENT_OTHER): Payer: Self-pay

## 2023-05-23 ENCOUNTER — Other Ambulatory Visit: Payer: Self-pay

## 2023-05-23 DIAGNOSIS — E876 Hypokalemia: Secondary | ICD-10-CM

## 2023-05-23 MED ORDER — POTASSIUM CHLORIDE ER 10 MEQ PO TBCR
10.0000 meq | EXTENDED_RELEASE_TABLET | Freq: Two times a day (BID) | ORAL | 0 refills | Status: DC
  Filled 2023-05-23: qty 180, 90d supply, fill #0

## 2023-05-23 MED ORDER — FENOFIBRATE 160 MG PO TABS
160.0000 mg | ORAL_TABLET | Freq: Every day | ORAL | 0 refills | Status: DC
  Filled 2023-05-23: qty 90, 90d supply, fill #0

## 2023-06-07 ENCOUNTER — Ambulatory Visit: Payer: Medicare Other | Admitting: Family Medicine

## 2023-06-07 ENCOUNTER — Other Ambulatory Visit (HOSPITAL_BASED_OUTPATIENT_CLINIC_OR_DEPARTMENT_OTHER): Payer: Self-pay

## 2023-06-07 ENCOUNTER — Other Ambulatory Visit: Payer: Self-pay | Admitting: Family Medicine

## 2023-06-09 ENCOUNTER — Other Ambulatory Visit: Payer: Self-pay | Admitting: Family Medicine

## 2023-06-09 ENCOUNTER — Other Ambulatory Visit (HOSPITAL_BASED_OUTPATIENT_CLINIC_OR_DEPARTMENT_OTHER): Payer: Self-pay

## 2023-06-11 ENCOUNTER — Other Ambulatory Visit (HOSPITAL_BASED_OUTPATIENT_CLINIC_OR_DEPARTMENT_OTHER): Payer: Self-pay

## 2023-06-11 MED ORDER — TRAZODONE HCL 150 MG PO TABS
150.0000 mg | ORAL_TABLET | Freq: Every day | ORAL | 1 refills | Status: DC
  Filled 2023-06-11: qty 90, 90d supply, fill #0

## 2023-06-11 NOTE — Telephone Encounter (Signed)
 Trazodone was rx'd by ER doctor-ok to refill?

## 2023-07-10 ENCOUNTER — Encounter: Payer: Self-pay | Admitting: Family Medicine

## 2023-07-10 ENCOUNTER — Ambulatory Visit (INDEPENDENT_AMBULATORY_CARE_PROVIDER_SITE_OTHER): Payer: Medicare Other | Admitting: Family Medicine

## 2023-07-10 DIAGNOSIS — E782 Mixed hyperlipidemia: Secondary | ICD-10-CM | POA: Diagnosis not present

## 2023-07-10 DIAGNOSIS — E876 Hypokalemia: Secondary | ICD-10-CM

## 2023-07-10 DIAGNOSIS — G609 Hereditary and idiopathic neuropathy, unspecified: Secondary | ICD-10-CM

## 2023-07-10 MED ORDER — NUCYNTA 100 MG PO TABS: 100.0000 mg | ORAL_TABLET | Freq: Four times a day (QID) | ORAL | 0 refills | Status: DC

## 2023-07-10 MED ORDER — MELOXICAM 15 MG PO TABS: 15.0000 mg | ORAL_TABLET | ORAL | Status: DC

## 2023-07-10 MED ORDER — POTASSIUM CHLORIDE ER 10 MEQ PO TBCR: 10.0000 meq | EXTENDED_RELEASE_TABLET | Freq: Two times a day (BID) | ORAL | 0 refills | Status: DC

## 2023-07-10 MED ORDER — METOPROLOL SUCCINATE ER 50 MG PO TB24: ORAL_TABLET | ORAL | 2 refills | Status: DC

## 2023-07-10 MED ORDER — FENOFIBRATE 160 MG PO TABS: 160.0000 mg | ORAL_TABLET | Freq: Every day | ORAL | 0 refills | Status: DC

## 2023-07-10 NOTE — Progress Notes (Signed)
 Subjective:  Patient ID: Brittany Mercer, female    DOB: 02-19-1946  Age: 78 y.o. MRN: [id number]  CC: Medication Refill   HPI Brittany Mercer presents for pain mgmt. After her last fall she decided to move. Yesterday moved into [facility]. Sons helped her move.   Has white to purple fingertips.  They stay cold. Onset 3 months ago. Occurring daily.   Pain runs 2-3/10 with med, 7-8/10 without. PDMP review shows appropriate utilization of the patient's controlled medications.  No longer taking repatha      07/10/2023   10:31 AM 04/03/2023   11:25 AM 03/12/2023    2:27 PM  Depression screen PHQ 2/9  Decreased Interest 0 0 0  Down, Depressed, Hopeless 0 0 0  PHQ - 2 Score 0 0 0  Altered sleeping 0 1 1  Tired, decreased energy 0 1 3  Change in appetite 0 0 1  Feeling bad or failure about yourself  0 0 0  Trouble concentrating 0 0 2  Moving slowly or fidgety/restless 0 0 2  Suicidal thoughts 0 0 0  PHQ-9 Score 0 2 9  Difficult doing work/chores Not difficult at all Not difficult at all Somewhat difficult    History Brittany Mercer has a past medical history of Allergy, Cataract, Chronic narcotic use, Headache, Hereditary and idiopathic peripheral neuropathy, Hyperlipidemia, Hypertension, Hypokalemia, OA (osteoarthritis), and Peripheral neuropathy.   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   Her family history includes Arthritis in her brother; Dementia in her father; Heart disease in her mother; Hyperlipidemia in her mother; Hypertension in her brother,  father, and mother; Neuropathy in her father; Stroke in her mother.She reports that she quit smoking about 24 years ago. Her smoking use included cigarettes. She started smoking about 55 years ago. She has a 15 pack-year smoking history. She has never used smokeless tobacco. She reports current alcohol use of about 4.0 standard drinks of alcohol per week. She reports that she does not use drugs.    ROS Review of Systems  Constitutional: Negative.   HENT: Negative.    Eyes:  Negative for visual disturbance.  Respiratory:  Negative for shortness of breath.   Cardiovascular:  Negative for chest pain.  Gastrointestinal:  Negative for abdominal pain.  Musculoskeletal:  Positive for back pain. Negative for arthralgias.    Objective:  BP 125/75   Pulse 60   Temp (!) 97.2 F (36.2 C)   Ht 5\' 9"  (1.753 m)   Wt 171 lb (77.6 kg)   SpO2 98%   BMI 25.25 kg/m   BP Readings from Last 3 Encounters:  07/10/23 125/75  04/03/23 (!) 153/87  03/12/23 (!) 145/88    Wt Readings from Last 3 Encounters:  07/10/23 171 lb (77.6 kg)  04/03/23 158 lb 6.4 oz (71.8 kg)  03/12/23 159 lb 6.4 oz (72.3 kg)     Physical Exam Constitutional:      General: She is not in acute distress.    Appearance: She is well-developed.  Cardiovascular:     Rate and Rhythm: Normal rate and regular rhythm.  Pulmonary:     Breath sounds: Normal breath sounds.  Musculoskeletal:        General: Normal range of motion.  Skin:    General: Skin is warm and dry.  Neurological:     Mental Status: She is alert and oriented to person, place, and time.      Assessment & Plan:  Idiopathic peripheral neuropathy -     Nucynta; Take 1 tablet (100 mg total) by mouth 4 (four) times daily.  Dispense: 120 tablet; Refill: 0 -     Nucynta; Take 1 tablet (100 mg total) by mouth 4 (four) times daily.  Dispense: 120 tablet; Refill: 0 -     Nucynta; Take 1 tablet (100 mg total) by mouth 4 (four) times daily.  Dispense: 120 tablet;  Refill: 0  Hypokalemia -     Potassium Chloride ER; Take 1 tablet (10 mEq total) by mouth 2 (two) times daily.  Dispense: 180 tablet; Refill: 0  Mixed hyperlipidemia  Other orders -     Metoprolol Succinate ER; Take with or immediately following a meal.  Dispense: 90 tablet; Refill: 2 -     Fenofibrate; Take 1 tablet (160 mg total) by mouth daily.  Dispense: 90 tablet; Refill: 0 -     Meloxicam; Take 1 tablet (15 mg total) by mouth every other day.     Follow-up: Return in about 3 months (around 10/09/2023).  Roise Cleaver, M.D.

## 2023-07-26 ENCOUNTER — Ambulatory Visit: Payer: Medicare Other

## 2023-07-26 VITALS — BP 125/75 | HR 60 | Ht 69.0 in | Wt 171.0 lb

## 2023-07-26 DIAGNOSIS — Z0000 Encounter for general adult medical examination without abnormal findings: Secondary | ICD-10-CM

## 2023-07-26 NOTE — Patient Instructions (Signed)
 Brittany Mercer , Thank you for taking time to come for your Medicare Wellness Visit. I appreciate your ongoing commitment to your health goals. Please review the following plan we discussed and let me know if I can assist you in the future.   Referrals/Orders/Follow-Ups/Clinician Recommendations: n/a  This is a list of the screening recommended for you and due dates:  Health Maintenance  Topic Date Due   COVID-19 Vaccine (4 - 2024-25 season) 07/26/2023*   Pap Smear  07/09/2024*   Mammogram  07/09/2024*   Flu Shot  10/26/2023   Medicare Annual Wellness Visit  07/25/2024   DEXA scan (bone density measurement)  04/05/2027   DTaP/Tdap/Td vaccine (3 - Td or Tdap) 03/03/2032   Pneumonia Vaccine  Completed   Hepatitis C Screening  Completed   Zoster (Shingles) Vaccine  Completed   HPV Vaccine  Aged Out   Meningitis B Vaccine  Aged Out   Colon Cancer Screening  Discontinued  *Topic was postponed. The date shown is not the original due date.    Advanced directives: (Copy Requested) Please bring a copy of your health care power of attorney and living will to the office to be added to your chart at your convenience. You can mail to [facility] [address] [place], [place] [address] or email to [email or web address]  Next Medicare Annual Wellness Visit scheduled for next year: Yes

## 2023-07-26 NOTE — Progress Notes (Signed)
 Subjective:   Brittany Mercer is a 78 y.o. who presents for a Medicare Wellness preventive visit.  Visit Complete: Virtual I connected with  [name] on 07/26/23 by a audio enabled telemedicine application and verified that I am speaking with the correct person using two identifiers.  Patient Location: Home  Provider Location: Home Office  I discussed the limitations of evaluation and management by telemedicine. The patient expressed understanding and agreed to proceed.  Vital Signs: Because this visit was a virtual/telehealth visit, some criteria may be missing or patient reported. Any vitals not documented were not able to be obtained and vitals that have been documented are patient reported.  VideoDeclined- This patient declined Librarian, academic. Therefore the visit was completed with audio only.  Persons Participating in Visit: Patient.  AWV Questionnaire: No: Patient Medicare AWV questionnaire was not completed prior to this visit.  Cardiac Risk Factors include: advanced age (>64men, >37 women);dyslipidemia;hypertension     Objective:    Today's Vitals   07/26/23 0802  BP: 125/75  Pulse: 60  Weight: 171 lb (77.6 kg)  Height: 5\' 9"  (1.753 m)   Body mass index is 25.25 kg/m.     07/26/2023    8:08 AM 02/22/2023    9:23 PM 02/22/2023    5:40 PM 10/18/2022   10:03 AM 07/21/2022   11:55 AM 07/19/2021    1:27 PM 03/31/2021    9:37 AM  Advanced Directives  Does Patient Have a Medical Advance Directive? Yes Yes Yes No Yes Yes Yes  Type of Diplomatic Services operational officer  Living will;Healthcare Power of [employer] Power of [place];Living will Healthcare Power of [place];Living will Healthcare Power of [place];Living will  Does patient want to make changes to medical advance directive?  No - Patient declined     No - Patient declined  Copy of Healthcare Power of Attorney in Chart? No - copy  requested    No - copy requested No - copy requested     Current Medications (verified) Outpatient Encounter Medications as of 07/26/2023  Medication Sig   fenofibrate  160 MG tablet Take 1 tablet (160 mg total) by mouth daily.   meclizine  (ANTIVERT ) 12.5 MG tablet Take 1 tablet (12.5 mg total) by mouth 3 (three) times daily as needed for dizziness.   meloxicam  (MOBIC ) 15 MG tablet Take 1 tablet (15 mg total) by mouth every other day.   metoprolol  succinate (TOPROL -XL) 50 MG 24 hr tablet Take with or immediately following a meal.   ondansetron  (ZOFRAN -ODT) 4 MG disintegrating tablet Take 1 tablet (4 mg total) by mouth every 8 (eight) hours as needed for nausea or vomiting. Take 1 tablet every 8 hours for nausea   potassium chloride  (KLOR-CON ) 10 MEQ tablet Take 1 tablet (10 mEq total) by mouth 2 (two) times daily.   prednisoLONE  acetate (PRED FORTE ) 1 % ophthalmic suspension Place 1 drop into the right eye daily.   [START ON 09/08/2023] Tapentadol  HCl (NUCYNTA ) 100 MG TABS Take 1 tablet (100 mg total) by mouth 4 (four) times daily.   Tapentadol  HCl (NUCYNTA ) 100 MG TABS Take 1 tablet (100 mg total) by mouth 4 (four) times daily.   [START ON 08/09/2023] Tapentadol  HCl (NUCYNTA ) 100 MG TABS Take 1 tablet (100 mg total) by mouth 4 (four) times daily.   traZODone  (DESYREL ) 150 MG tablet Take 1 tablet (150 mg total) by mouth at bedtime.   Evolocumab  (REPATHA  SURECLICK) 140 MG/ML SOAJ Inject  140 mg into the skin every 14 (fourteen) days. (Patient not taking: Reported on 07/26/2023)   No facility-administered encounter medications on file as of 07/26/2023.    Allergies (verified) Keflex  [cephalexin ], Misc. sulfonamide containing compounds, Statins, Zetia  [ezetimibe ], Other, Penicillins, and Sulfa antibiotics   History: Past Medical History:  Diagnosis Date   Allergy    Cataract    Chronic narcotic use    takes Nucynta  for neuropathic pain   Headache    Hereditary and idiopathic peripheral neuropathy     followed by pcp   Hyperlipidemia    Hypertension    Hypokalemia    OA (osteoarthritis)    LEFT KNEE   Peripheral neuropathy    Past Surgical History:  Procedure Laterality Date   ABDOMINAL HYSTERECTOMY  1980s   W/ UNILATERAL SALPINGOOPHORECTOMY   CATARACT EXTRACTION Right    CESAREAN SECTION  x2   last one 1975   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   EYE SURGERY     KNEE ARTHROSCOPY Right x2  last one 2016   KNEE CLOSED REDUCTION Left 04/11/2018   Procedure: CLOSED MANIPULATION KNEE;  Surgeon: Claiborne Crew, MD;  Location: [facility];  Service: Orthopedics;  Laterality: Left;   SHOULDER OPEN ROTATOR CUFF REPAIR Left 1990s   TONSILLECTOMY  age 49   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   Procedure: RIGHT TOTAL KNEE ARTHROPLASTY;  Surgeon: Claiborne Crew, MD;  Location: [facility];  Service: Orthopedics;  Laterality: Right;   TOTAL KNEE ARTHROPLASTY Left 02/05/2018   Procedure: LEFT TOTAL KNEE ARTHROPLASTY;  Surgeon: Claiborne Crew, MD;  Location: [facility];  Service: Orthopedics;  Laterality: Left;  70 mins   Family History  Problem Relation Age of Onset   Hypertension Mother    Hyperlipidemia Mother    Stroke Mother    Heart disease Mother    Hypertension Father    Dementia Father    Neuropathy Father    Hypertension Brother    Arthritis Brother    Colon cancer Neg Hx    Colon polyps Neg Hx    Esophageal cancer Neg Hx    Rectal cancer Neg Hx    Stomach cancer Neg Hx    Social History   Socioeconomic History   Marital status: Widowed    Spouse name: Not on file   Number of children: 2   Years of education: college   Highest education level: Bachelor's degree (e.g., BA, AB, BS)  Occupational History   Occupation: Retired   Occupation: Scientist, research (physical sciences)  Tobacco Use   Smoking status: Former    Current packs/day: 0.00    Average packs/day: 0.5 packs/day for 30.0 years (15.0 ttl pk-yrs)    Types: Cigarettes    Start date: 07/17/1968    Quit date: 07/18/1998    Years  since quitting: 25.0   Smokeless tobacco: Never  Vaping Use   Vaping status: Never Used  Substance and Sexual Activity   Alcohol use: Yes    Alcohol/week: 4.0 standard drinks of alcohol    Types: 4 Glasses of wine per week    Comment: 1/month   Drug use: Never   Sexual activity: Not Currently    Birth control/protection: Surgical  Other Topics Concern   Not on file  Social History Narrative   Lives alone. Has 2 adult sons and 4 grandchildren.   Social Drivers of Corporate investment banker Strain: Low Risk  (07/26/2023)   Overall Financial Resource Strain (CARDIA)    Difficulty of Paying Living  Expenses: Not hard at all  Food Insecurity: No Food Insecurity (07/26/2023)   Hunger Vital Sign    Worried About Running Out of Food in the Last Year: Never true    Ran Out of Food in the Last Year: Never true  Transportation Needs: No Transportation Needs (07/26/2023)   PRAPARE - Administrator, Civil Service (Medical): No    Lack of Transportation (Non-Medical): No  Physical Activity: Insufficiently Active (07/26/2023)   Exercise Vital Sign    Days of Exercise per Week: 3 days    Minutes of Exercise per Session: 20 min  Stress: No Stress Concern Present (07/26/2023)   [employer] of Occupational Health - Occupational Stress Questionnaire    Feeling of Stress : Not at all  Social Connections: Moderately Isolated (07/26/2023)   Social Connection and Isolation Panel [NHANES]    Frequency of Communication with Friends and Family: More than three times a week    Frequency of Social Gatherings with Friends and Family: More than three times a week    Attends Religious Services: More than 4 times per year    Active Member of [employer] or Organizations: No    Attends Banker Meetings: Never    Marital Status: Widowed    Tobacco Counseling Counseling given: Yes    Clinical Intake:  Pre-visit preparation completed: Yes  Pain : No/denies pain     BMI - recorded:  25.25 Nutritional Status: BMI 25 -29 Overweight Nutritional Risks: None Diabetes: No  Lab Results  Component Value Date   HGBA1C 4.9 01/30/2018     How often do you need to have someone help you when you read instructions, pamphlets, or other written materials from your doctor or pharmacy?: 1 - Never  Interpreter Needed?: No  Information entered by :: Alia T/cma   Activities of Daily Living     07/26/2023    8:08 AM 02/22/2023    9:23 PM  In your present state of health, do you have any difficulty performing the following activities:  Hearing? 0 0  Vision? 0 0  Difficulty concentrating or making decisions? 0 0  Walking or climbing stairs? 0   Dressing or bathing? 0   Doing errands, shopping? 0   Preparing Food and eating ? N   Using the Toilet? N   In the past six months, have you accidently leaked urine? N   Do you have problems with loss of bowel control? N   Managing your Medications? N   Managing your Finances? N   Housekeeping or managing your Housekeeping? N     Patient Care Team: Roise Cleaver, MD as PCP - General (Family Medicine) Lorry Rouge, MD as Referring Physician (Dermatology) Ladon Pickler, MD as Referring Physician (Ophthalmology)  Indicate any recent Medical Services you may have received from other than [facility] providers in the past year (date may be approximate).     Assessment:   This is a routine wellness examination for Fusaye.  Hearing/Vision screen Hearing Screening - Comments:: Pt wears hearing aids Vision Screening - Comments:: Pt denies vision Pt goes to Dr. Eldred Grego, [place]   Goals Addressed             This Visit's Progress    AWV   On track    07/19/2021 AWV Goal: Fall Prevention  Over the next year, patient will decrease their risk for falls by: Using assistive devices, such as a cane or walker, as needed Identifying fall risks within  their home and correcting them by: Removing throw rugs Adding  handrails to stairs or ramps Removing clutter and keeping a clear pathway throughout the home Increasing light, especially at night Adding shower handles/bars Raising toilet seat Identifying potential personal risk factors for falls: Medication side effects Incontinence/urgency Vestibular dysfunction Hearing loss Musculoskeletal disorders Neurological disorders Orthostatic hypotension       Patient Stated   On track    Eat less, sleep better       Depression Screen     07/26/2023    8:15 AM 07/10/2023   10:31 AM 04/03/2023   11:25 AM 03/12/2023    2:27 PM 03/12/2023    2:20 PM 12/07/2022    9:46 AM 11/09/2022   10:59 AM  PHQ 2/9 Scores  PHQ - 2 Score 0 0 0 0 0 0 1  PHQ- 9 Score 1 0 2 9   11     Fall Risk     07/26/2023    8:07 AM 04/03/2023   11:25 AM 03/12/2023    2:26 PM 03/12/2023    2:20 PM 12/07/2022    9:45 AM  Fall Risk   Falls in the past year? 1 1 1  0 1  Number falls in past yr: 0 1 1  1   Injury with Fall? 1 1 1  1   Risk for fall due to : Impaired balance/gait;Impaired mobility History of fall(s) History of fall(s);Impaired balance/gait  History of fall(s)  Follow up  Falls evaluation completed Falls evaluation completed  Falls evaluation completed    MEDICARE RISK AT HOME:  Medicare Risk at Home Any stairs in or around the home?: No If so, are there any without handrails?: No Home free of loose throw rugs in walkways, pet beds, electrical cords, etc?: Yes Adequate lighting in your home to reduce risk of falls?: Yes Life alert?: Yes (just moved into an assistant living that has a 'help' system when needed) Use of a cane, walker or w/c?: Yes (sometimes uses a cane) Grab bars in the bathroom?: Yes Shower chair or bench in shower?: No Elevated toilet seat or a handicapped toilet?: No  TIMED UP AND GO:  Was the test performed?  no  Cognitive Function: 6CIT completed    06/06/2018    4:08 PM  MMSE - Mini Mental State Exam  Orientation to time 5   Orientation to Place 5  Registration 3  Attention/ Calculation 5  Recall 3  Language- name 2 objects 2  Language- repeat 1  Language- follow 3 step command 3  Language- read & follow direction 1  Write a sentence 1  Copy design 1  Total score 30        07/26/2023    8:18 AM 07/21/2022   11:56 AM 07/19/2021    1:28 PM 07/16/2020    4:43 PM 06/09/2019    2:35 PM  6CIT Screen  What Year? 0 points 0 points 0 points 0 points 0 points  What month? 0 points 0 points 0 points 0 points 0 points  What time? 0 points 0 points 0 points 0 points 0 points  Count back from 20 0 points 0 points 0 points 0 points 0 points  Months in reverse 0 points 0 points 0 points 0 points 0 points  Repeat phrase 0 points 0 points 2 points 0 points 0 points  Total Score 0 points 0 points 2 points 0 points 0 points    Immunizations Immunization History  Administered Date(s) Administered  Fluad Quad(high Dose 65+) 01/22/2020, 01/25/2022   Fluad Trivalent(High Dose 65+) 12/07/2022   Influenza, High Dose Seasonal PF 02/12/2016, 12/31/2018, 01/14/2021   Influenza,inj,Quad PF,6+ Mos 02/17/2013, 12/31/2013, 12/21/2014   Influenza,inj,quad, With Preservative 11/08/2017   Influenza-Unspecified 01/15/2017, 11/08/2017   PFIZER(Purple Top)SARS-COV-2 Vaccination 05/03/2019, 05/31/2019, 01/22/2020   PNEUMOCOCCAL CONJUGATE-20 04/27/2021   Pneumococcal Polysaccharide-23 07/17/2012   Respiratory Syncytial Virus Vaccine,Recomb Aduvanted(Arexvy) 07/22/2022   Tdap 05/14/2017, 03/03/2022   Zoster Recombinant(Shingrix ) 06/06/2018, 10/19/2020   Zoster, Live 02/12/2014    Screening Tests Health Maintenance  Topic Date Due   COVID-19 Vaccine (4 - 2024-25 season) 07/26/2023 (Originally 11/26/2022)   Cervical Cancer Screening (Pap smear)  07/09/2024 (Originally 04/09/2015)   MAMMOGRAM  07/09/2024 (Originally 11/05/2021)   INFLUENZA VACCINE  10/26/2023   Medicare Annual Wellness (AWV)  07/25/2024   DEXA SCAN  04/05/2027    DTaP/Tdap/Td (3 - Td or Tdap) 03/03/2032   Pneumonia Vaccine 65+ Years old  Completed   Hepatitis C Screening  Completed   Zoster Vaccines- Shingrix   Completed   HPV VACCINES  Aged Out   Meningococcal B Vaccine  Aged Out   Colonoscopy  Discontinued    Health Maintenance  There are no preventive care reminders to display for this patient.  Health Maintenance Items Addressed: See Nurse Notes  Additional Screening:  Vision Screening: Recommended annual ophthalmology exams for early detection of glaucoma and other disorders of the eye.  Dental Screening: Recommended annual dental exams for proper oral hygiene  Community Resource Referral / Chronic Care Management: CRR required this visit?  No   CCM required this visit?  No     Plan:     I have personally reviewed and noted the following in the patient's chart:   Medical and social history Use of alcohol, tobacco or illicit drugs  Current medications and supplements including opioid prescriptions. Patient is not currently taking opioid prescriptions. Functional ability and status Nutritional status Physical activity Advanced directives List of other physicians Hospitalizations, surgeries, and ER visits in previous 12 months Vitals Screenings to include cognitive, depression, and falls Referrals and appointments  In addition, I have reviewed and discussed with patient certain preventive protocols, quality metrics, and best practice recommendations. A written personalized care plan for preventive services as well as general preventive health recommendations were provided to patient.     Michaelle Adolphus, CMA   07/26/2023   After Visit Summary: (MyChart) Due to this being a telephonic visit, the after visit summary with patients personalized plan was offered to patient via MyChart   Notes: Nothing significant to report at this time.

## 2023-08-08 ENCOUNTER — Other Ambulatory Visit: Payer: Self-pay

## 2023-09-06 ENCOUNTER — Encounter: Payer: Self-pay | Admitting: Family Medicine

## 2023-09-06 ENCOUNTER — Telehealth: Payer: Self-pay

## 2023-09-06 DIAGNOSIS — G609 Hereditary and idiopathic neuropathy, unspecified: Secondary | ICD-10-CM

## 2023-09-06 NOTE — Telephone Encounter (Signed)
 Copied from CRM [id number]. Topic: Clinical - Prescription Issue >> Sep 06, 2023  2:21 PM [place] wrote: Reason for CRM: Tapentadol  HCl (NUCYNTA ) 100 MG TABS- not in stock- may have to try another pharmacy so she can get it before [phone]

## 2023-09-06 NOTE — Telephone Encounter (Signed)
 Please see mychart messages regarding this. Will close encounter.

## 2023-09-07 ENCOUNTER — Other Ambulatory Visit (HOSPITAL_BASED_OUTPATIENT_CLINIC_OR_DEPARTMENT_OTHER): Payer: Self-pay

## 2023-09-07 MED ORDER — NUCYNTA 100 MG PO TABS
100.0000 mg | ORAL_TABLET | Freq: Four times a day (QID) | ORAL | 0 refills | Status: DC
  Filled 2023-09-08 (×2): qty 120, 30d supply, fill #0
  Filled ????-??-??: fill #0

## 2023-09-08 ENCOUNTER — Other Ambulatory Visit (HOSPITAL_BASED_OUTPATIENT_CLINIC_OR_DEPARTMENT_OTHER): Payer: Self-pay

## 2023-09-10 ENCOUNTER — Other Ambulatory Visit: Payer: Self-pay

## 2023-09-10 ENCOUNTER — Other Ambulatory Visit (HOSPITAL_BASED_OUTPATIENT_CLINIC_OR_DEPARTMENT_OTHER): Payer: Self-pay

## 2023-10-09 ENCOUNTER — Ambulatory Visit (INDEPENDENT_AMBULATORY_CARE_PROVIDER_SITE_OTHER): Admitting: Family Medicine

## 2023-10-09 ENCOUNTER — Encounter: Payer: Self-pay | Admitting: Family Medicine

## 2023-10-09 ENCOUNTER — Other Ambulatory Visit (HOSPITAL_BASED_OUTPATIENT_CLINIC_OR_DEPARTMENT_OTHER): Payer: Self-pay

## 2023-10-09 VITALS — BP 154/79 | HR 55 | Temp 97.8°F | Ht 69.0 in | Wt 166.0 lb

## 2023-10-09 DIAGNOSIS — G609 Hereditary and idiopathic neuropathy, unspecified: Secondary | ICD-10-CM | POA: Diagnosis not present

## 2023-10-09 DIAGNOSIS — M79604 Pain in right leg: Secondary | ICD-10-CM | POA: Diagnosis not present

## 2023-10-09 DIAGNOSIS — M47816 Spondylosis without myelopathy or radiculopathy, lumbar region: Secondary | ICD-10-CM

## 2023-10-09 DIAGNOSIS — E876 Hypokalemia: Secondary | ICD-10-CM | POA: Diagnosis not present

## 2023-10-09 DIAGNOSIS — M79605 Pain in left leg: Secondary | ICD-10-CM | POA: Diagnosis not present

## 2023-10-09 DIAGNOSIS — E782 Mixed hyperlipidemia: Secondary | ICD-10-CM

## 2023-10-09 DIAGNOSIS — H8113 Benign paroxysmal vertigo, bilateral: Secondary | ICD-10-CM | POA: Diagnosis not present

## 2023-10-09 MED ORDER — NUCYNTA 100 MG PO TABS
100.0000 mg | ORAL_TABLET | Freq: Four times a day (QID) | ORAL | 0 refills | Status: DC
  Filled 2023-10-09: qty 120, 30d supply, fill #0

## 2023-10-09 MED ORDER — POTASSIUM CHLORIDE ER 10 MEQ PO TBCR: 10.0000 meq | EXTENDED_RELEASE_TABLET | Freq: Two times a day (BID) | ORAL | 0 refills | Status: DC

## 2023-10-09 MED ORDER — NUCYNTA 100 MG PO TABS
100.0000 mg | ORAL_TABLET | Freq: Four times a day (QID) | ORAL | 0 refills | Status: DC
  Filled 2023-11-08: qty 120, 30d supply, fill #0

## 2023-10-09 MED ORDER — TRAZODONE HCL 150 MG PO TABS: 150.0000 mg | ORAL_TABLET | Freq: Every day | ORAL | 1 refills | Status: DC

## 2023-10-09 MED ORDER — FENOFIBRATE 160 MG PO TABS: 160.0000 mg | ORAL_TABLET | Freq: Every day | ORAL | 0 refills | Status: DC

## 2023-10-09 MED ORDER — NUCYNTA 100 MG PO TABS
100.0000 mg | ORAL_TABLET | Freq: Four times a day (QID) | ORAL | 0 refills | Status: DC
Start: 2023-12-08 — End: 2024-01-09
  Filled 2023-12-07 – 2023-12-08 (×3): qty 120, 30d supply, fill #0

## 2023-10-09 MED ORDER — MELOXICAM 15 MG PO TABS: 15.0000 mg | ORAL_TABLET | ORAL | 3 refills | Status: DC

## 2023-10-09 NOTE — Progress Notes (Signed)
 Subjective:  Patient ID: Brittany Mercer, female    DOB: January 29, 1946  Age: 78 y.o. MRN: [id number]  CC: Medical Management of Chronic Issues (No concerns at this time. /Medications pended/ nucynta  to [place] [facility] and [facility])   HPI Brittany Mercer presents for follow-up on her chronic pain.  Pain level can 7-day days out of 10.  However she is taking the Nucynta  regularly.  She does report that she has trouble getting it filled frequently for variety of reasons including refusal of [facility] to order it at all until the day that it is too and then she has to wait several days before it comes in.  She also has tried to go to [facility] and they will only give her a 1 month supply between contacts with this office.  Another worse the 2nd and 3rd prescription for a 16-month service have no purpose in their system.  The origin of her pain is idiopathic neuropathy which has been treated for many years prior to my meeting her.  She has been on a stable dose of her Nucynta  for at least a decade.PDMP review shows appropriate utilization of the patient's controlled medications.  Patient in for follow-up of elevated cholesterol. Doing well without complaints on current medication. Denies side effects of statin including myalgia and arthralgia and nausea. Also in today for liver function testing. Currently no chest pain, shortness of breath or other cardiovascular related symptoms noted.      10/09/2023   10:07 AM 07/26/2023    8:15 AM 07/10/2023   10:31 AM  Depression screen PHQ 2/9  Decreased Interest 0 0 0  Down, Depressed, Hopeless 0 0 0  PHQ - 2 Score 0 0 0  Altered sleeping 1 1 0  Tired, decreased energy 1 0 0  Change in appetite 0 0 0  Feeling bad or failure about yourself  0 0 0  Trouble concentrating 0 0 0  Moving slowly or fidgety/restless 0 0 0  Suicidal thoughts 0 0 0  PHQ-9 Score 2 1 0  Difficult doing work/chores  Not difficult at all Not difficult  at all    History Rahima has a past medical history of Allergy, Cataract, Chronic narcotic use, Headache, Hereditary and idiopathic peripheral neuropathy, Hyperlipidemia, Hypertension, Hypokalemia, OA (osteoarthritis), and Peripheral neuropathy.   She has a past surgical history that includes Cesarean section (x2   last one 62); Total knee arthroplasty (Right, 01/25/2015); Abdominal hysterectomy (1980s); Knee arthroscopy (Right, x2  last one 2016); Shoulder open rotator cuff repair (Left, 1990s); Tonsillectomy (age 34); Total knee arthroplasty (Left, 02/05/2018); Knee Closed Reduction (Left, 04/11/2018); Eye surgery; Corneal transplant (Right, 05/22/2019); and Cataract extraction (Right).   Her family history includes Arthritis in her brother; Dementia in her father; Heart disease in her mother; Hyperlipidemia in her mother; Hypertension in her brother, father, and mother; Neuropathy in her father; Stroke in her mother.She reports that she quit smoking about 25 years ago. Her smoking use included cigarettes. She started smoking about 55 years ago. She has a 15 pack-year smoking history. She has never used smokeless tobacco. She reports current alcohol use of about 4.0 standard drinks of alcohol per week. She reports that she does not use drugs.    ROS Review of Systems  Objective:  BP (!) 154/79   Pulse (!) 55   Temp 97.8 F (36.6 C)   Ht 5' 9 (1.753 m)   Wt 166 lb (75.3 kg)  SpO2 98%   BMI 24.51 kg/m   BP Readings from Last 3 Encounters:  10/09/23 (!) 154/79  07/26/23 125/75  07/10/23 125/75    Wt Readings from Last 3 Encounters:  10/09/23 166 lb (75.3 kg)  07/26/23 171 lb (77.6 kg)  07/10/23 171 lb (77.6 kg)     Physical Exam   Assessment & Plan:  Lumbar spondylosis -     CMP14+EGFR  Hypokalemia -     Potassium Chloride  ER; Take 1 tablet (10 mEq total) by mouth 2 (two) times daily.  Dispense: 180 tablet; Refill: 0 -     CMP14+EGFR  Idiopathic peripheral  neuropathy -     Nucynta ; Take 1 tablet (100 mg total) by mouth 4 (four) times daily.  Dispense: 120 tablet; Refill: 0 -     CMP14+EGFR -     Nucynta ; Take 1 tablet (100 mg total) by mouth 4 (four) times daily.  Dispense: 120 tablet; Refill: 0 -     Nucynta ; Take 1 tablet (100 mg total) by mouth 4 (four) times daily.  Dispense: 120 tablet; Refill: 0  Mixed hyperlipidemia -     CMP14+EGFR -     Lipid panel  Other orders -     Fenofibrate ; Take 1 tablet (160 mg total) by mouth daily.  Dispense: 90 tablet; Refill: 0 -     traZODone  HCl; Take 1 tablet (150 mg total) by mouth at bedtime.  Dispense: 90 tablet; Refill: 1 -     Meloxicam ; Take 1 tablet (15 mg total) by mouth every other day.  Dispense: 45 tablet; Refill: 3     Follow-up: Return in about 3 months (around 01/09/2024).  Butler Der, M.D.

## 2023-10-10 ENCOUNTER — Other Ambulatory Visit (HOSPITAL_BASED_OUTPATIENT_CLINIC_OR_DEPARTMENT_OTHER): Payer: Self-pay

## 2023-10-10 LAB — CMP14+EGFR
ALT: 14 IU/L (ref 0–32)
AST: 15 IU/L (ref 0–40)
Albumin: 4.7 g/dL (ref 3.8–4.8)
Alkaline Phosphatase: 53 IU/L (ref 44–121)
BUN/Creatinine Ratio: 24 (ref 12–28)
BUN: 24 mg/dL (ref 8–27)
Bilirubin Total: 0.4 mg/dL (ref 0.0–1.2)
CO2: 19 mmol/L — ABNORMAL LOW (ref 20–29)
Calcium: 10.1 mg/dL (ref 8.7–10.3)
Chloride: 104 mmol/L (ref 96–106)
Creatinine, Ser: 1.01 mg/dL — ABNORMAL HIGH (ref 0.57–1.00)
Globulin, Total: 2.1 g/dL (ref 1.5–4.5)
Glucose: 79 mg/dL (ref 70–99)
Potassium: 4.6 mmol/L (ref 3.5–5.2)
Sodium: 141 mmol/L (ref 134–144)
Total Protein: 6.8 g/dL (ref 6.0–8.5)
eGFR: 57 mL/min/1.73 — ABNORMAL LOW (ref >59)

## 2023-10-10 LAB — LIPID PANEL
Chol/HDL Ratio: 3.8 ratio (ref 0.0–4.4)
Cholesterol, Total: 178 mg/dL (ref 100–199)
HDL: 47 mg/dL (ref >39)
LDL Chol Calc (NIH): 113 mg/dL — ABNORMAL HIGH (ref 0–99)
Triglycerides: 98 mg/dL (ref 0–149)
VLDL Cholesterol Cal: 18 mg/dL (ref 5–40)

## 2023-10-11 DIAGNOSIS — M79604 Pain in right leg: Secondary | ICD-10-CM | POA: Diagnosis not present

## 2023-10-11 DIAGNOSIS — M79605 Pain in left leg: Secondary | ICD-10-CM | POA: Diagnosis not present

## 2023-10-11 DIAGNOSIS — H8113 Benign paroxysmal vertigo, bilateral: Secondary | ICD-10-CM | POA: Diagnosis not present

## 2023-10-15 ENCOUNTER — Ambulatory Visit: Payer: Self-pay | Admitting: Family Medicine

## 2023-10-16 DIAGNOSIS — M79604 Pain in right leg: Secondary | ICD-10-CM | POA: Diagnosis not present

## 2023-10-16 DIAGNOSIS — M79605 Pain in left leg: Secondary | ICD-10-CM | POA: Diagnosis not present

## 2023-10-16 DIAGNOSIS — H8113 Benign paroxysmal vertigo, bilateral: Secondary | ICD-10-CM | POA: Diagnosis not present

## 2023-10-18 DIAGNOSIS — H8113 Benign paroxysmal vertigo, bilateral: Secondary | ICD-10-CM | POA: Diagnosis not present

## 2023-10-18 DIAGNOSIS — M79605 Pain in left leg: Secondary | ICD-10-CM | POA: Diagnosis not present

## 2023-10-18 DIAGNOSIS — M79604 Pain in right leg: Secondary | ICD-10-CM | POA: Diagnosis not present

## 2023-10-23 DIAGNOSIS — M79605 Pain in left leg: Secondary | ICD-10-CM | POA: Diagnosis not present

## 2023-10-23 DIAGNOSIS — M79604 Pain in right leg: Secondary | ICD-10-CM | POA: Diagnosis not present

## 2023-10-23 DIAGNOSIS — H8113 Benign paroxysmal vertigo, bilateral: Secondary | ICD-10-CM | POA: Diagnosis not present

## 2023-11-08 ENCOUNTER — Other Ambulatory Visit (HOSPITAL_BASED_OUTPATIENT_CLINIC_OR_DEPARTMENT_OTHER): Payer: Self-pay

## 2023-11-08 DIAGNOSIS — M79604 Pain in right leg: Secondary | ICD-10-CM | POA: Diagnosis not present

## 2023-11-08 DIAGNOSIS — M79605 Pain in left leg: Secondary | ICD-10-CM | POA: Diagnosis not present

## 2023-11-08 DIAGNOSIS — H8113 Benign paroxysmal vertigo, bilateral: Secondary | ICD-10-CM | POA: Diagnosis not present

## 2023-11-09 ENCOUNTER — Other Ambulatory Visit (HOSPITAL_BASED_OUTPATIENT_CLINIC_OR_DEPARTMENT_OTHER): Payer: Self-pay

## 2023-11-09 DIAGNOSIS — Z23 Encounter for immunization: Secondary | ICD-10-CM | POA: Diagnosis not present

## 2023-11-12 ENCOUNTER — Other Ambulatory Visit: Payer: Self-pay

## 2023-11-12 ENCOUNTER — Emergency Department (HOSPITAL_BASED_OUTPATIENT_CLINIC_OR_DEPARTMENT_OTHER)
Admission: EM | Admit: 2023-11-12 | Discharge: 2023-11-12 | Disposition: A | Discharge status: Home / Self Care | Attending: Emergency Medicine | Admitting: Emergency Medicine

## 2023-11-12 ENCOUNTER — Emergency Department (HOSPITAL_BASED_OUTPATIENT_CLINIC_OR_DEPARTMENT_OTHER)

## 2023-11-12 ENCOUNTER — Encounter (HOSPITAL_BASED_OUTPATIENT_CLINIC_OR_DEPARTMENT_OTHER): Payer: Self-pay | Admitting: Emergency Medicine

## 2023-11-12 DIAGNOSIS — S0101XA Laceration without foreign body of scalp, initial encounter: Secondary | ICD-10-CM | POA: Diagnosis not present

## 2023-11-12 DIAGNOSIS — W19XXXA Unspecified fall, initial encounter: Secondary | ICD-10-CM | POA: Diagnosis not present

## 2023-11-12 DIAGNOSIS — S0990XA Unspecified injury of head, initial encounter: Secondary | ICD-10-CM

## 2023-11-12 DIAGNOSIS — S0181XA Laceration without foreign body of other part of head, initial encounter: Secondary | ICD-10-CM | POA: Insufficient documentation

## 2023-11-12 MED ORDER — LIDOCAINE-EPINEPHRINE (PF) 2 %-1:200000 IJ SOLN
10.0000 mL | Freq: Once | INTRAMUSCULAR | Status: AC
  Administered 2023-11-12: 10 mL via INTRADERMAL
  Filled 2023-11-12: qty 20

## 2023-11-12 MED ORDER — LIDOCAINE-EPINEPHRINE-TETRACAINE (LET) TOPICAL GEL
3.0000 mL | Freq: Once | TOPICAL | Status: AC
  Administered 2023-11-12: 3 mL via TOPICAL
  Filled 2023-11-12: qty 3

## 2023-11-12 NOTE — Discharge Instructions (Signed)
 Your history, exam, workup did not reveal evidence of skull fracture or facial fracture but did show the laceration and bruising of the face.  I suspect you have a mild head injury and possible concussion with the persistent headache however your imaging was otherwise reassuring.  We feel you are safe for discharge home now that the wound has been repaired.  We had 9 sutures placed that are nonabsorbable so you have the removed in about a week to 10 days.  Please watch for signs and symptoms of infection as they can happen even with good wound cleaning.  Your tetanus shot was up-to-date per the chart.  Please rest and stay hydrated and if any symptoms change or worsen, return to the nearest emergency department.

## 2023-11-12 NOTE — ED Provider Notes (Signed)
 [place] EMERGENCY DEPARTMENT AT [facility] Provider Note   CSN: [id number] Arrival date & time: 11/12/23  9253     Patient presents with: Brittany Mercer is a 78 y.o. female.   The history is provided by the patient and medical records. No language interpreter was used.  Fall This is a new problem. The current episode started 6 to 12 hours ago. The problem has not changed since onset.Associated symptoms include headaches. Pertinent negatives include no chest pain, no abdominal pain and no shortness of breath. Nothing aggravates the symptoms. Nothing relieves the symptoms. She has tried nothing for the symptoms. The treatment provided no relief.       Prior to Admission medications   Medication Sig Start Date End Date Taking? Authorizing Provider  fenofibrate  160 MG tablet Take 1 tablet (160 mg total) by mouth daily. 10/09/23   Zollie Lowers, MD  meloxicam  (MOBIC ) 15 MG tablet Take 1 tablet (15 mg total) by mouth every other day. 10/09/23   Zollie Lowers, MD  metoprolol  succinate (TOPROL -XL) 50 MG 24 hr tablet Take with or immediately following a meal. 07/10/23   Zollie Lowers, MD  ondansetron  (ZOFRAN -ODT) 4 MG disintegrating tablet Take 1 tablet (4 mg total) by mouth every 8 (eight) hours as needed for nausea or vomiting. Take 1 tablet every 8 hours for nausea 11/22/22   Nandigam, Kavitha V, MD  potassium chloride  (KLOR-CON ) 10 MEQ tablet Take 1 tablet (10 mEq total) by mouth 2 (two) times daily. 10/09/23   Zollie Lowers, MD  prednisoLONE  acetate (PRED FORTE ) 1 % ophthalmic suspension Place 1 drop into the right eye daily. 12/01/22     Tapentadol  HCl (NUCYNTA ) 100 MG TABS Take 1 tablet (100 mg total) by mouth 4 (four) times daily. 07/10/23 08/09/23  Zollie Lowers, MD  Tapentadol  HCl (NUCYNTA ) 100 MG TABS Take 1 tablet (100 mg total) by mouth 4 (four) times daily. 11/08/23 12/09/23  Zollie Lowers, MD  Tapentadol  HCl (NUCYNTA ) 100 MG TABS Take 1 tablet (100 mg total)  by mouth 4 (four) times daily. 12/08/23 01/07/24  Zollie Lowers, MD  Tapentadol  HCl (NUCYNTA ) 100 MG TABS Take 1 tablet (100 mg total) by mouth 4 (four) times daily. 10/09/23 11/09/23  Zollie Lowers, MD  traZODone  (DESYREL ) 150 MG tablet Take 1 tablet (150 mg total) by mouth at bedtime. 10/09/23   Zollie Lowers, MD    Allergies: Keflex  [cephalexin ], Misc. sulfonamide containing compounds, Statins, Zetia  [ezetimibe ], Other, Penicillins, and Sulfa antibiotics    Review of Systems  Constitutional:  Negative for chills, fatigue and fever.  HENT:  Negative for congestion.   Eyes:  Negative for visual disturbance.  Respiratory:  Negative for cough, chest tightness and shortness of breath.   Cardiovascular:  Negative for chest pain.  Gastrointestinal:  Negative for abdominal pain, constipation, diarrhea, nausea and vomiting.  Genitourinary:  Negative for dysuria and flank pain.  Musculoskeletal:  Negative for back pain and neck pain.  Skin:  Positive for wound.  Neurological:  Positive for headaches. Negative for speech difficulty, weakness, light-headedness and numbness.  Psychiatric/Behavioral:  Negative for agitation.   All other systems reviewed and are negative.   Updated Vital Signs BP (!) 168/79 (BP Location: Right Arm)   Pulse 67   Temp 98.7 F (37.1 C) (Oral)   Resp 16   SpO2 97%   Physical Exam Vitals and nursing note reviewed.  Constitutional:      General: She is not in acute distress.  Appearance: She is well-developed. She is not ill-appearing, toxic-appearing or diaphoretic.  HENT:     Head: Abrasion, contusion and laceration present.      Comments: Laceration to right forehead.  Bruising around right orbit.  No focal nasal tenderness or tenderness on the cheeks.  Tenderness on the scalp.  Neck nontender.  Pupils symmetric and reactive with normal extraocular movements without evidence of entrapment    Nose: No congestion or rhinorrhea.     Mouth/Throat:      Mouth: Mucous membranes are moist.     Pharynx: No oropharyngeal exudate or posterior oropharyngeal erythema.  Eyes:     Extraocular Movements: Extraocular movements intact.     Conjunctiva/sclera: Conjunctivae normal.  Cardiovascular:     Rate and Rhythm: Normal rate and regular rhythm.     Heart sounds: No murmur heard. Pulmonary:     Effort: Pulmonary effort is normal. No respiratory distress.     Breath sounds: Normal breath sounds. No wheezing, rhonchi or rales.  Chest:     Chest wall: No tenderness.  Abdominal:     Palpations: Abdomen is soft.     Tenderness: There is no abdominal tenderness.  Musculoskeletal:        General: Tenderness present. No swelling.     Cervical back: Neck supple.  Skin:    General: Skin is warm and dry.     Capillary Refill: Capillary refill takes less than 2 seconds.     Findings: Bruising present. No erythema.  Neurological:     General: No focal deficit present.     Mental Status: She is alert.     Sensory: No sensory deficit.     Motor: No weakness.  Psychiatric:        Mood and Affect: Mood normal.     (all labs ordered are listed, but only abnormal results are displayed) Labs Reviewed - No data to display  EKG: None  Radiology: CT Head Wo Contrast Result Date: 11/12/2023 CLINICAL DATA:  Laceration to right forehead with bruising around the right orbit. Patient fell this morning. EXAM: CT HEAD WITHOUT CONTRAST CT MAXILLOFACIAL WITHOUT CONTRAST TECHNIQUE: Multidetector CT imaging of the head and maxillofacial structures were performed using the standard protocol without intravenous contrast. Multiplanar CT image reconstructions of the maxillofacial structures were also generated. RADIATION DOSE REDUCTION: This exam was performed according to the departmental dose-optimization program which includes automated exposure control, adjustment of the mA and/or kV according to patient size and/or use of iterative reconstruction technique.  COMPARISON:  Head CT 03/26/2023. FINDINGS: CT HEAD FINDINGS Brain: There is no evidence for acute hemorrhage, hydrocephalus, mass lesion, or abnormal extra-axial fluid collection. No definite CT evidence for acute infarction. Patchy low attenuation in the deep hemispheric and periventricular white matter is nonspecific, but likely reflects chronic microvascular ischemic demyelination. Vascular: No hyperdense vessel or unexpected calcification. Skull: No evidence for fracture. No worrisome lytic or sclerotic lesion. Other: Soft tissue gas in the right frontal scalp is compatible laceration. CT MAXILLOFACIAL FINDINGS Osseous: No fracture or mandibular dislocation. No destructive process. Orbits: Negative. No traumatic or inflammatory finding. Sinuses: Trace mucosal thickening noted left maxillary sinus. Remaining paranasal sinuses and mastoid air cells are clear. Soft tissues: Right frontal scalp contusion with laceration. IMPRESSION: 1. No acute intracranial abnormality. 2. Right frontal scalp contusion with laceration. 3. No evidence for facial bone fracture. Electronically Signed   By: Camellia Candle M.D.   On: 11/12/2023 09:01   CT Maxillofacial Wo Contrast Result Date: 11/12/2023  CLINICAL DATA:  Laceration to right forehead with bruising around the right orbit. Patient fell this morning. EXAM: CT HEAD WITHOUT CONTRAST CT MAXILLOFACIAL WITHOUT CONTRAST TECHNIQUE: Multidetector CT imaging of the head and maxillofacial structures were performed using the standard protocol without intravenous contrast. Multiplanar CT image reconstructions of the maxillofacial structures were also generated. RADIATION DOSE REDUCTION: This exam was performed according to the departmental dose-optimization program which includes automated exposure control, adjustment of the mA and/or kV according to patient size and/or use of iterative reconstruction technique. COMPARISON:  Head CT 03/26/2023. FINDINGS: CT HEAD FINDINGS Brain: There  is no evidence for acute hemorrhage, hydrocephalus, mass lesion, or abnormal extra-axial fluid collection. No definite CT evidence for acute infarction. Patchy low attenuation in the deep hemispheric and periventricular white matter is nonspecific, but likely reflects chronic microvascular ischemic demyelination. Vascular: No hyperdense vessel or unexpected calcification. Skull: No evidence for fracture. No worrisome lytic or sclerotic lesion. Other: Soft tissue gas in the right frontal scalp is compatible laceration. CT MAXILLOFACIAL FINDINGS Osseous: No fracture or mandibular dislocation. No destructive process. Orbits: Negative. No traumatic or inflammatory finding. Sinuses: Trace mucosal thickening noted left maxillary sinus. Remaining paranasal sinuses and mastoid air cells are clear. Soft tissues: Right frontal scalp contusion with laceration. IMPRESSION: 1. No acute intracranial abnormality. 2. Right frontal scalp contusion with laceration. 3. No evidence for facial bone fracture. Electronically Signed   By: Camellia Candle M.D.   On: 11/12/2023 09:01     .Laceration Repair  Date/Time: 11/12/2023 11:07 AM  Performed by: Ginger Lonni PARAS, MD Authorized by: Ginger Lonni PARAS, MD   Consent:    Consent obtained:  Verbal   Consent given by:  Patient   Risks, benefits, and alternatives were discussed: yes     Risks discussed:  Pain, poor cosmetic result and infection Universal protocol:    Imaging studies available: yes     Immediately prior to procedure, a time out was called: yes     Patient identity confirmed:  Verbally with patient Anesthesia:    Anesthesia method:  Local infiltration and topical application   Topical anesthetic:  LET   Local anesthetic:  Lidocaine  2% WITH epi Laceration details:    Location:  Face   Face location:  Forehead   Length (cm):  4   Depth (mm):  3 Pre-procedure details:    Preparation:  Patient was prepped and draped in usual sterile fashion and  imaging obtained to evaluate for foreign bodies Exploration:    Limited defect created (wound extended): no     Imaging outcome: foreign body not noted     Wound exploration: wound explored through full range of motion and entire depth of wound visualized     Contaminated: no   Treatment:    Area cleansed with:  Chlorhexidine , saline and Shur-Clens   Amount of cleaning:  Standard   Irrigation solution:  Sterile saline   Debridement:  None   Undermining:  None Skin repair:    Repair method:  Sutures   Suture size:  5-0   Suture material:  Prolene   Suture technique:  Simple interrupted   Number of sutures:  9 Approximation:    Approximation:  Close Repair type:    Repair type:  Simple Post-procedure details:    Dressing:  Antibiotic ointment and non-adherent dressing   Procedure completion:  Tolerated    Medications Ordered in the ED  lidocaine -EPINEPHrine -tetracaine  (LET) topical gel (3 mLs Topical Given 11/12/23 0826)  lidocaine -EPINEPHrine -tetracaine  (LET)  topical gel (3 mLs Topical Given 11/12/23 1005)  lidocaine -EPINEPHrine  (XYLOCAINE  W/EPI) 2 %-1:200000 (PF) injection 10 mL (10 mLs Intradermal Given by Other 11/12/23 1104)                                    Medical Decision Making Amount and/or Complexity of Data Reviewed Radiology: ordered.  Risk Prescription drug management.    Brittany Mercer is a 78 y.o. female with a past medical history significant for hypertension, hyperlipidemia, migraines, vertigo, and previous subdural who presents with head injury after fall.  According to patient, 2 AM she got up to go the bathroom and lost her balance and fell.  This is not uncommon for her but she hit her forehead.  She presents for evaluation and management of the wound.  She sustained laceration to her right forehead and bruising around her right eye.  She reports headache but denies neck pain.  Denies any extremity pains, chest pain, back pain, or abdominal  pain.  She denies any preceding symptoms and denies any recent fevers, chills, congestion, cough, nausea, vomiting, constipation, diarrhea, urinary changes.  She denies vision changes or speech changes.  She thinks her tetanus shot is up-to-date.  On exam, patient does have a laceration to her right forehead that is more vertical in orientation.  She also has bruising under her right eye.  Did not have significant orbital tenderness on the lower side and did not have evidence of entrapment with normal extract movements.  Symmetric smile.  Clear speech.  No focal neurologic deficits otherwise.  Lungs clear and chest nontender.  Abdomen nontender.  Neck and back nontender.  No other injury seen.  We did shared decision made conversation and agreed to hold on extensive workup we will get CT of the head and face and will order some let for the laceration.  He will need repair after imaging.  Tetanus shot appears to be up-to-date in the chart.  Anticipate discharge after imaging and wound repair.  CT scan returned showing no skull fracture or orbital fracture.  Will repair wound and plan for discharge home.  11:05 AM Laceration was repaired without difficulty with 9 sutures.  Will plan for discharge home and follow-up with the PCP.  Suspect some head injury with persistent headache but imaging was reassuring. patient is to return precautions and follow-up instructions and was discharged in good condition.     Final diagnoses:  Fall, initial encounter  Laceration of forehead, initial encounter  Injury of head, initial encounter    ED Discharge Orders     None       Clinical Impression: 1. Fall, initial encounter   2. Laceration of forehead, initial encounter   3. Injury of head, initial encounter     Disposition: Discharge  Condition: Good  I have discussed the results, Dx and Tx plan with the pt(& family if present). He/she/they expressed understanding and agree(s) with the plan.  Discharge instructions discussed at great length. Strict return precautions discussed and pt &/or family have verbalized understanding of the instructions. No further questions at time of discharge.    New Prescriptions   No medications on file    Follow Up: Zollie Lowers, MD [address] [place] [address] [phone]     [place] [facility] at [facility] [address] [place]  [id number] [phone]  Evolette Pendell, Lonni PARAS, MD 11/12/23 [phone]

## 2023-11-12 NOTE — ED Triage Notes (Signed)
 Mechanical fall around 0200 this morning. Denies Loc. Denies thinners. Large lac to forehead. Bruising under R eye.  Ambulatory.

## 2023-11-12 NOTE — ED Notes (Signed)
 DC paperwork given and verbally understood.

## 2023-11-21 ENCOUNTER — Other Ambulatory Visit: Payer: Self-pay

## 2023-11-21 ENCOUNTER — Emergency Department (HOSPITAL_BASED_OUTPATIENT_CLINIC_OR_DEPARTMENT_OTHER)
Admission: EM | Admit: 2023-11-21 | Discharge: 2023-11-21 | Disposition: A | Discharge status: Home / Self Care | Attending: Emergency Medicine | Admitting: Emergency Medicine

## 2023-11-21 DIAGNOSIS — Z79899 Other long term (current) drug therapy: Secondary | ICD-10-CM | POA: Insufficient documentation

## 2023-11-21 DIAGNOSIS — Z4802 Encounter for removal of sutures: Secondary | ICD-10-CM | POA: Insufficient documentation

## 2023-11-21 DIAGNOSIS — Z87891 Personal history of nicotine dependence: Secondary | ICD-10-CM | POA: Diagnosis not present

## 2023-11-21 DIAGNOSIS — I10 Essential (primary) hypertension: Secondary | ICD-10-CM | POA: Insufficient documentation

## 2023-11-21 DIAGNOSIS — S0181XD Laceration without foreign body of other part of head, subsequent encounter: Secondary | ICD-10-CM | POA: Diagnosis not present

## 2023-11-21 NOTE — Discharge Instructions (Signed)
 Your sutures removed in the emergency department.  See information attached your discharge papers regarding suture removal aftercare.

## 2023-11-21 NOTE — ED Triage Notes (Signed)
 Suture removal of 9 stitches on forehead,

## 2023-11-21 NOTE — ED Provider Notes (Signed)
 [place] EMERGENCY DEPARTMENT AT [facility] Provider Note   CSN: [id number] Arrival date & time: 11/21/23  1147     Patient presents with: Suture / Staple Removal   Brittany Mercer is a 78 y.o. female.    Suture / Staple Removal   78 year old female presents to the emergency department with request of suture removal.  Had sutures were placed on the 18th of this month around 9 days ago after a fall.  Had a forehead laceration at that time.  9 nonabsorbable Prolene sutures were placed.  Patient denies any wound complications.  Denies any fever, pus drainage, redness.  Presents to have the sutures taken out.  Past medical history significant for hyperlipidemia, hypertension, hypokalemia, peripheral neuropathy, chronic narcotic use, subdural hematoma  Prior to Admission medications   Medication Sig Start Date End Date Taking? Authorizing Provider  fenofibrate  160 MG tablet Take 1 tablet (160 mg total) by mouth daily. 10/09/23   Zollie Lowers, MD  meloxicam  (MOBIC ) 15 MG tablet Take 1 tablet (15 mg total) by mouth every other day. 10/09/23   Zollie Lowers, MD  metoprolol  succinate (TOPROL -XL) 50 MG 24 hr tablet Take with or immediately following a meal. 07/10/23   Zollie Lowers, MD  ondansetron  (ZOFRAN -ODT) 4 MG disintegrating tablet Take 1 tablet (4 mg total) by mouth every 8 (eight) hours as needed for nausea or vomiting. Take 1 tablet every 8 hours for nausea 11/22/22   Nandigam, Kavitha V, MD  potassium chloride  (KLOR-CON ) 10 MEQ tablet Take 1 tablet (10 mEq total) by mouth 2 (two) times daily. 10/09/23   Zollie Lowers, MD  prednisoLONE  acetate (PRED FORTE ) 1 % ophthalmic suspension Place 1 drop into the right eye daily. 12/01/22     Tapentadol  HCl (NUCYNTA ) 100 MG TABS Take 1 tablet (100 mg total) by mouth 4 (four) times daily. 07/10/23 08/09/23  Zollie Lowers, MD  Tapentadol  HCl (NUCYNTA ) 100 MG TABS Take 1 tablet (100 mg total) by mouth 4 (four) times daily. 11/08/23  12/09/23  Zollie Lowers, MD  Tapentadol  HCl (NUCYNTA ) 100 MG TABS Take 1 tablet (100 mg total) by mouth 4 (four) times daily. 12/08/23 01/07/24  Zollie Lowers, MD  Tapentadol  HCl (NUCYNTA ) 100 MG TABS Take 1 tablet (100 mg total) by mouth 4 (four) times daily. 10/09/23 11/09/23  Zollie Lowers, MD  traZODone  (DESYREL ) 150 MG tablet Take 1 tablet (150 mg total) by mouth at bedtime. 10/09/23   Zollie Lowers, MD    Allergies: Keflex  [cephalexin ], Misc. sulfonamide containing compounds, Statins, Zetia  [ezetimibe ], Other, Penicillins, and Sulfa antibiotics    Review of Systems  All other systems reviewed and are negative.   Updated Vital Signs BP (!) 141/80 (BP Location: Right Arm)   Pulse 67   Temp 98.9 F (37.2 C) (Oral)   Resp 18   SpO2 99%   Physical Exam Vitals and nursing note reviewed.  Constitutional:      General: She is not in acute distress.    Appearance: She is well-developed.  HENT:     Head: Normocephalic and atraumatic.     Comments: Well-approximated prior laceration right forehead 9 Prolene sutures in place.  No erythema, purulent drainage or palpable tenderness. Eyes:     Conjunctiva/sclera: Conjunctivae normal.  Cardiovascular:     Rate and Rhythm: Normal rate and regular rhythm.  Pulmonary:     Effort: Pulmonary effort is normal. No respiratory distress.     Breath sounds: Normal breath sounds.  Abdominal:  Palpations: Abdomen is soft.     Tenderness: There is no abdominal tenderness.  Musculoskeletal:        General: No swelling.     Cervical back: Neck supple.  Skin:    General: Skin is warm and dry.     Capillary Refill: Capillary refill takes less than 2 seconds.  Neurological:     Mental Status: She is alert.  Psychiatric:        Mood and Affect: Mood normal.     (all labs ordered are listed, but only abnormal results are displayed) Labs Reviewed - No data to display  EKG: None  Radiology: No results found.   Suture  Removal  Date/Time: 11/21/2023 12:10 PM  Performed by: Brittany Wonda LABOR, PA Authorized by: Brittany Wonda LABOR, PA   Consent:    Consent obtained:  Verbal   Consent given by:  Patient   Risks, benefits, and alternatives were discussed: yes     Risks discussed:  Pain, bleeding and wound separation   Alternatives discussed:  No treatment, delayed treatment and alternative treatment Universal protocol:    Procedure explained and questions answered to patient or proxy's satisfaction: yes     Relevant documents present and verified: yes     Test results available: yes     Patient identity confirmed:  Verbally with patient Location:    Location:  Head/neck   Head/neck location:  Forehead Procedure details:    Wound appearance:  No signs of infection, good wound healing and clean   Number of sutures removed:  9 Post-procedure details:    Procedure completion:  Tolerated well, no immediate complications    Medications Ordered in the ED - No data to display                                  Medical Decision Making  This patient presents to the ED for concern of suture removal, this involves an extensive number of treatment options, and is a complaint that carries with it a high risk of complications and morbidity.  The differential diagnosis includes wound dehiscence, cellulitis, abscess, other   Co morbidities that complicate the patient evaluation  See HPI   Additional history obtained:  Additional history obtained from EMR External records from outside source obtained and reviewed including hospital records   Lab Tests:  N/a   Imaging Studies ordered:  N/a   Cardiac Monitoring: / EKG:  N/a   Consultations Obtained:  N/a   Problem List / ED Course / Critical interventions / Medication management  Suture removal Reevaluation of the patient showed that the patient stayed the same I have reviewed the patients home medicines and have made adjustments as  needed   Social Determinants of Health:  Former cigarette use.  Denies illicit drug use.   Test / Admission - Considered:  Suture removal Vitals signs significant for htn blood pressure 141/80. Otherwise within normal range and stable throughout visit. 78 year old female presents to the emergency department with request of suture removal.  Had sutures were placed on the 18th of this month around 9 days ago after a fall.  Had a forehead laceration at that time.  9 nonabsorbable Prolene sutures were placed.  Patient denies any wound complications.  Denies any fever, pus drainage, redness.  Presents to have the sutures taken out. On exam, prior laceration repaired using Prolene sutures.  Wound well-approximated.  No secondary evidence  skin changes concerning for infectious process.  Sutures removed in manner as above.  Will recommend local skin care at home.  Recommend follow-up with primary care routinely.  Treatment plan discussed with patient and she acknowledged and stated was agreeable.  Patient well-appearing, afebrile in no acute distress upon discharge. Worrisome signs and symptoms were discussed with the patient, and the patient acknowledged understanding to return to the ED if noticed. Patient was stable upon discharge.       Final diagnoses:  Visit for suture removal    ED Discharge Orders     None          Brittany Mercer, [place] 11/21/23 1213    Ruthe Cornet, DO 11/21/23 1405

## 2023-11-22 DIAGNOSIS — M79605 Pain in left leg: Secondary | ICD-10-CM | POA: Diagnosis not present

## 2023-11-22 DIAGNOSIS — H8113 Benign paroxysmal vertigo, bilateral: Secondary | ICD-10-CM | POA: Diagnosis not present

## 2023-11-22 DIAGNOSIS — M79604 Pain in right leg: Secondary | ICD-10-CM | POA: Diagnosis not present

## 2023-11-29 DIAGNOSIS — M79605 Pain in left leg: Secondary | ICD-10-CM | POA: Diagnosis not present

## 2023-11-29 DIAGNOSIS — M79604 Pain in right leg: Secondary | ICD-10-CM | POA: Diagnosis not present

## 2023-11-29 DIAGNOSIS — H8113 Benign paroxysmal vertigo, bilateral: Secondary | ICD-10-CM | POA: Diagnosis not present

## 2023-12-04 DIAGNOSIS — H8113 Benign paroxysmal vertigo, bilateral: Secondary | ICD-10-CM | POA: Diagnosis not present

## 2023-12-04 DIAGNOSIS — M79604 Pain in right leg: Secondary | ICD-10-CM | POA: Diagnosis not present

## 2023-12-04 DIAGNOSIS — L821 Other seborrheic keratosis: Secondary | ICD-10-CM | POA: Diagnosis not present

## 2023-12-04 DIAGNOSIS — M79605 Pain in left leg: Secondary | ICD-10-CM | POA: Diagnosis not present

## 2023-12-04 DIAGNOSIS — Z85828 Personal history of other malignant neoplasm of skin: Secondary | ICD-10-CM | POA: Diagnosis not present

## 2023-12-04 DIAGNOSIS — L723 Sebaceous cyst: Secondary | ICD-10-CM | POA: Diagnosis not present

## 2023-12-06 ENCOUNTER — Other Ambulatory Visit (HOSPITAL_BASED_OUTPATIENT_CLINIC_OR_DEPARTMENT_OTHER): Payer: Self-pay

## 2023-12-06 DIAGNOSIS — H8113 Benign paroxysmal vertigo, bilateral: Secondary | ICD-10-CM | POA: Diagnosis not present

## 2023-12-06 DIAGNOSIS — M79605 Pain in left leg: Secondary | ICD-10-CM | POA: Diagnosis not present

## 2023-12-06 DIAGNOSIS — M79604 Pain in right leg: Secondary | ICD-10-CM | POA: Diagnosis not present

## 2023-12-07 ENCOUNTER — Other Ambulatory Visit (HOSPITAL_BASED_OUTPATIENT_CLINIC_OR_DEPARTMENT_OTHER): Payer: Self-pay

## 2023-12-08 ENCOUNTER — Other Ambulatory Visit (HOSPITAL_BASED_OUTPATIENT_CLINIC_OR_DEPARTMENT_OTHER): Payer: Self-pay

## 2023-12-25 DIAGNOSIS — M79605 Pain in left leg: Secondary | ICD-10-CM | POA: Diagnosis not present

## 2023-12-25 DIAGNOSIS — M79604 Pain in right leg: Secondary | ICD-10-CM | POA: Diagnosis not present

## 2023-12-25 DIAGNOSIS — H8113 Benign paroxysmal vertigo, bilateral: Secondary | ICD-10-CM | POA: Diagnosis not present

## 2023-12-27 DIAGNOSIS — M79605 Pain in left leg: Secondary | ICD-10-CM | POA: Diagnosis not present

## 2023-12-27 DIAGNOSIS — M79604 Pain in right leg: Secondary | ICD-10-CM | POA: Diagnosis not present

## 2023-12-27 DIAGNOSIS — H8113 Benign paroxysmal vertigo, bilateral: Secondary | ICD-10-CM | POA: Diagnosis not present

## 2024-01-01 DIAGNOSIS — H8113 Benign paroxysmal vertigo, bilateral: Secondary | ICD-10-CM | POA: Diagnosis not present

## 2024-01-01 DIAGNOSIS — M79605 Pain in left leg: Secondary | ICD-10-CM | POA: Diagnosis not present

## 2024-01-01 DIAGNOSIS — M79604 Pain in right leg: Secondary | ICD-10-CM | POA: Diagnosis not present

## 2024-01-03 DIAGNOSIS — M79604 Pain in right leg: Secondary | ICD-10-CM | POA: Diagnosis not present

## 2024-01-03 DIAGNOSIS — M79605 Pain in left leg: Secondary | ICD-10-CM | POA: Diagnosis not present

## 2024-01-03 DIAGNOSIS — H8113 Benign paroxysmal vertigo, bilateral: Secondary | ICD-10-CM | POA: Diagnosis not present

## 2024-01-08 DIAGNOSIS — H8113 Benign paroxysmal vertigo, bilateral: Secondary | ICD-10-CM | POA: Diagnosis not present

## 2024-01-08 DIAGNOSIS — M79605 Pain in left leg: Secondary | ICD-10-CM | POA: Diagnosis not present

## 2024-01-08 DIAGNOSIS — M79604 Pain in right leg: Secondary | ICD-10-CM | POA: Diagnosis not present

## 2024-01-09 ENCOUNTER — Ambulatory Visit: Admitting: Family Medicine

## 2024-01-09 ENCOUNTER — Other Ambulatory Visit (HOSPITAL_BASED_OUTPATIENT_CLINIC_OR_DEPARTMENT_OTHER): Payer: Self-pay

## 2024-01-09 ENCOUNTER — Encounter: Payer: Self-pay | Admitting: Family Medicine

## 2024-01-09 VITALS — BP 136/75 | HR 64 | Temp 98.2°F | Ht 69.0 in | Wt 167.6 lb

## 2024-01-09 DIAGNOSIS — G609 Hereditary and idiopathic neuropathy, unspecified: Secondary | ICD-10-CM | POA: Diagnosis not present

## 2024-01-09 DIAGNOSIS — Z79899 Other long term (current) drug therapy: Secondary | ICD-10-CM | POA: Diagnosis not present

## 2024-01-09 MED ORDER — NUCYNTA 100 MG PO TABS
100.0000 mg | ORAL_TABLET | Freq: Four times a day (QID) | ORAL | 0 refills | Status: DC
  Filled 2024-01-09: qty 120, 30d supply, fill #0

## 2024-01-09 MED ORDER — MELOXICAM 15 MG PO TABS: 15.0000 mg | ORAL_TABLET | Freq: Every day | ORAL | 3 refills | Status: DC

## 2024-01-09 MED ORDER — NUCYNTA 100 MG PO TABS
100.0000 mg | ORAL_TABLET | Freq: Four times a day (QID) | ORAL | 0 refills | Status: DC
  Filled 2024-02-08 (×2): qty 120, 30d supply, fill #0

## 2024-01-09 MED ORDER — NUCYNTA 100 MG PO TABS: 100.0000 mg | ORAL_TABLET | Freq: Four times a day (QID) | ORAL | 0 refills | Status: DC

## 2024-01-09 NOTE — Progress Notes (Signed)
 Subjective:  Patient ID: Brittany Mercer, female    DOB: [birth year]  Age: 78 y.o. MRN: [id number]  CC: Medical Management of Chronic Issues (Update controlled substance contract and [employer])   HPI  Discussed the use of AI scribe software for clinical note transcription with the patient, who gave verbal consent to proceed.  History of Present Illness Brittany Mercer Brittany Mercer is a 78 year old female with chronic pain and neuropathy who presents with dizziness and recent falls.  She experiences dizziness, particularly when standing up, which has been a persistent issue. She is currently undergoing physical therapy twice a week, which she finds helpful, although it leaves her sore the following day. The physical therapist is focusing on strengthening and teaching her proper breathing techniques, as she tends to breathe shallowly when in pain or stressed.  She describes a recent fall that resulted in nine stitches on her forehead. She recalls waking up while falling in the bathroom, having attempted to grab a towel bar which she ripped off the wall. She has no memory of the events leading up to the fall and is concerned about the possibility of sleepwalking, although a neurologist previously suggested she was not sleepwalking due to lack of childhood history. She has taken precautions to ensure a clear path to the bathroom to prevent future falls.  She experiences burning sensations in her feet, which sometimes feel cold to the touch. On particularly bad days, she has difficulty sleeping due to the burning pain and has taken extra medication to manage the symptoms. She has a history of trying various medications, including gabapentin and pregabalin, with limited success. She currently takes Nucynta , which she finds effective, and naloxone, which she takes every day or two depending on her symptoms.  She also reports ongoing back pain, which has been an issue since a previous  incident. She feels old and stiff, with difficulty getting up and down, but acknowledges that she is better off than some others. She lives in [place] and notes that many people in her community use walkers and wheelchairs, which makes her feel fortunate to still be able to walk most of the time.  She mentions that she has not taken her medication for over two days due to a delay in starting her prescription, which has affected her symptoms. She also notes that she is not wearing her hearing aids today as she wants her ears checked for wax buildup.          01/09/2024    9:11 AM 10/09/2023   10:07 AM 07/26/2023    8:15 AM  Depression screen PHQ 2/9  Decreased Interest 0 0 0  Down, Depressed, Hopeless 0 0 0  PHQ - 2 Score 0 0 0  Altered sleeping 1 1 1   Tired, decreased energy 1 1 0  Change in appetite 0 0 0  Feeling bad or failure about yourself  0 0 0  Trouble concentrating 0 0 0  Moving slowly or fidgety/restless 0 0 0  Suicidal thoughts 0 0 0  PHQ-9 Score 2 2 1   Difficult doing work/chores Not difficult at all  Not difficult at all    History Brittany Mercer has a past medical history of Allergy, Cataract, Chronic narcotic use, Headache, Hereditary and idiopathic peripheral neuropathy, Hyperlipidemia, Hypertension, Hypokalemia, OA (osteoarthritis), and Peripheral neuropathy.   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   Her family history includes Arthritis in her brother; Dementia in her father; Heart disease in her mother; Hyperlipidemia in her mother; Hypertension in her brother,  father, and mother; Neuropathy in her father; Stroke in her mother.She reports that she quit smoking about 25 years ago. Her smoking use included cigarettes. She started smoking about 55 years ago. She has a 15 pack-year smoking history. She has never used smokeless tobacco. She reports current alcohol use of about 4.0 standard drinks of alcohol per week. She reports that she does not use drugs.    ROS Review of Systems  Constitutional: Negative.   HENT:  Negative for congestion.   Eyes:  Negative for visual disturbance.  Respiratory:  Negative for shortness of breath.   Cardiovascular:  Negative for chest pain.  Gastrointestinal:  Negative for abdominal pain, constipation, diarrhea, nausea and vomiting.  Genitourinary:  Negative for difficulty urinating.  Musculoskeletal:  Positive for myalgias. Negative for arthralgias.  Neurological:  Positive for numbness. Negative for headaches.  Psychiatric/Behavioral:  Negative for sleep disturbance.     Objective:  BP 136/75   Pulse 64   Temp 98.2 F (36.8 C)   Ht 5' 9 (1.753 m)   Wt 167 lb 9.6 oz (76 kg)   SpO2 98%   BMI 24.75 kg/m   BP Readings from Last 3 Encounters:  01/09/24 136/75  11/21/23 (!) 141/80  11/12/23 133/74    Wt Readings from Last 3 Encounters:  01/09/24 167 lb 9.6 oz (76 kg)  10/09/23 166 lb (75.3 kg)  07/26/23 171 lb (77.6 kg)     Physical Exam Physical Exam GENERAL: Alert, cooperative, well developed, no acute distress HEENT: Normocephalic, normal oropharynx, moist mucous membranes CHEST: Clear to auscultation bilaterally, No wheezes, rhonchi, or crackles CARDIOVASCULAR: Normal heart rate and rhythm, S1 and S2 normal without murmurs ABDOMEN: Soft, non-tender, non-distended, without organomegaly, Normal bowel sounds EXTREMITIES: No cyanosis or edema NEUROLOGICAL: Cranial nerves grossly intact, Moves all extremities without gross motor or sensory deficit   Assessment & Plan:  Controlled substance  agreement signed -     [employer]ox[employer] Select 13 (MW), Urine  Idiopathic peripheral neuropathy -     Nucynta ; Take 1 tablet (100 mg total) by mouth 4 (four) times daily.  Dispense: 120 tablet; Refill: 0 -     Nucynta ; Take 1 tablet (100 mg total) by mouth 4 (four) times daily.  Dispense: 120 tablet; Refill: 0 -     Nucynta ; Take 1 tablet (100 mg total) by mouth 4 (four) times daily.  Dispense: 120 tablet; Refill: 0  Other orders -     Meloxicam ; Take 1 tablet (15 mg total) by mouth daily.  Dispense: 90 tablet; Refill: 3    Assessment and Plan Assessment & Plan Idiopathic peripheral neuropathy   Chronic idiopathic peripheral neuropathy presents with a burning sensation in her feet, recently exacerbated. The current regimen provides some relief but does not completely control symptoms. She reports a burning sensation despite cold feet, indicating a false sensation due to neuropathy. Continue Nucynta  100 mg orally four times daily. Discussed potential benefits of pregabalin (Lyrica) for neuropathy. Increase naloxone to one tablet daily for better symptom control.  Lumbar spondylosis   Chronic lumbar spondylosis contributes to back pain and stiffness. Symptoms are persistent but  manageable with the current treatment regimen. Continue current management with physical therapy twice a week and Meloxicam  15 mg orally every other day.  Dizziness, likely orthostatic   Intermittent dizziness is likely orthostatic in nature. She is careful when standing up.  Fall with head laceration   A recent fall resulted in a head laceration requiring nine stitches. A possible sleepwalking episode was considered, but a neurologist previously ruled out sleepwalking due to the lack of childhood history. She is taking precautions to prevent future falls by ensuring a clear path to the bathroom and keeping a light by the bed.  Cerumen impaction, bilateral   Bilateral cerumen impaction affects her hearing. 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 Recommend visiting [facility] for hearing aid consultation.  Follow-up: Return in about 3 months (around 04/10/2024) for Pain.  Butler Der, M.D.

## 2024-01-09 NOTE — Patient Instructions (Signed)
 For hearing aid use Brittany Mercer , [facility], [address]

## 2024-01-10 ENCOUNTER — Other Ambulatory Visit (HOSPITAL_BASED_OUTPATIENT_CLINIC_OR_DEPARTMENT_OTHER): Payer: Self-pay

## 2024-01-10 DIAGNOSIS — M79604 Pain in right leg: Secondary | ICD-10-CM | POA: Diagnosis not present

## 2024-01-10 DIAGNOSIS — H8113 Benign paroxysmal vertigo, bilateral: Secondary | ICD-10-CM | POA: Diagnosis not present

## 2024-01-10 DIAGNOSIS — M79605 Pain in left leg: Secondary | ICD-10-CM | POA: Diagnosis not present

## 2024-01-12 LAB — TOXASSURE SELECT 13 (MW), URINE

## 2024-01-24 DIAGNOSIS — M79604 Pain in right leg: Secondary | ICD-10-CM | POA: Diagnosis not present

## 2024-01-24 DIAGNOSIS — H8113 Benign paroxysmal vertigo, bilateral: Secondary | ICD-10-CM | POA: Diagnosis not present

## 2024-01-24 DIAGNOSIS — M79605 Pain in left leg: Secondary | ICD-10-CM | POA: Diagnosis not present

## 2024-01-29 DIAGNOSIS — M79604 Pain in right leg: Secondary | ICD-10-CM | POA: Diagnosis not present

## 2024-01-29 DIAGNOSIS — H8113 Benign paroxysmal vertigo, bilateral: Secondary | ICD-10-CM | POA: Diagnosis not present

## 2024-01-29 DIAGNOSIS — M79605 Pain in left leg: Secondary | ICD-10-CM | POA: Diagnosis not present

## 2024-01-31 DIAGNOSIS — M79605 Pain in left leg: Secondary | ICD-10-CM | POA: Diagnosis not present

## 2024-01-31 DIAGNOSIS — H8113 Benign paroxysmal vertigo, bilateral: Secondary | ICD-10-CM | POA: Diagnosis not present

## 2024-01-31 DIAGNOSIS — M79604 Pain in right leg: Secondary | ICD-10-CM | POA: Diagnosis not present

## 2024-02-07 DIAGNOSIS — H8113 Benign paroxysmal vertigo, bilateral: Secondary | ICD-10-CM | POA: Diagnosis not present

## 2024-02-07 DIAGNOSIS — M79605 Pain in left leg: Secondary | ICD-10-CM | POA: Diagnosis not present

## 2024-02-07 DIAGNOSIS — M79604 Pain in right leg: Secondary | ICD-10-CM | POA: Diagnosis not present

## 2024-02-08 ENCOUNTER — Other Ambulatory Visit (HOSPITAL_BASED_OUTPATIENT_CLINIC_OR_DEPARTMENT_OTHER): Payer: Self-pay

## 2024-02-08 ENCOUNTER — Other Ambulatory Visit: Payer: Self-pay

## 2024-02-11 ENCOUNTER — Other Ambulatory Visit (HOSPITAL_BASED_OUTPATIENT_CLINIC_OR_DEPARTMENT_OTHER): Payer: Self-pay

## 2024-02-12 ENCOUNTER — Other Ambulatory Visit: Payer: Self-pay | Admitting: *Deleted

## 2024-02-12 DIAGNOSIS — E876 Hypokalemia: Secondary | ICD-10-CM

## 2024-02-26 ENCOUNTER — Observation Stay (HOSPITAL_COMMUNITY)

## 2024-02-26 ENCOUNTER — Other Ambulatory Visit (HOSPITAL_BASED_OUTPATIENT_CLINIC_OR_DEPARTMENT_OTHER): Payer: Self-pay

## 2024-02-26 ENCOUNTER — Other Ambulatory Visit: Payer: Self-pay

## 2024-02-26 ENCOUNTER — Emergency Department (HOSPITAL_BASED_OUTPATIENT_CLINIC_OR_DEPARTMENT_OTHER)

## 2024-02-26 ENCOUNTER — Emergency Department (HOSPITAL_BASED_OUTPATIENT_CLINIC_OR_DEPARTMENT_OTHER): Admitting: Radiology

## 2024-02-26 ENCOUNTER — Encounter (HOSPITAL_BASED_OUTPATIENT_CLINIC_OR_DEPARTMENT_OTHER): Payer: Self-pay

## 2024-02-26 ENCOUNTER — Observation Stay (HOSPITAL_BASED_OUTPATIENT_CLINIC_OR_DEPARTMENT_OTHER)
Admission: EM | Admit: 2024-02-26 | Discharge: 2024-02-29 | DRG: 176 | Disposition: A | Attending: Family Medicine | Admitting: Family Medicine

## 2024-02-26 DIAGNOSIS — Z87828 Personal history of other (healed) physical injury and trauma: Secondary | ICD-10-CM | POA: Diagnosis not present

## 2024-02-26 DIAGNOSIS — G609 Hereditary and idiopathic neuropathy, unspecified: Secondary | ICD-10-CM | POA: Diagnosis present

## 2024-02-26 DIAGNOSIS — I2699 Other pulmonary embolism without acute cor pulmonale: Secondary | ICD-10-CM

## 2024-02-26 DIAGNOSIS — Z96653 Presence of artificial knee joint, bilateral: Secondary | ICD-10-CM | POA: Diagnosis present

## 2024-02-26 DIAGNOSIS — I672 Cerebral atherosclerosis: Secondary | ICD-10-CM | POA: Diagnosis not present

## 2024-02-26 DIAGNOSIS — M47816 Spondylosis without myelopathy or radiculopathy, lumbar region: Secondary | ICD-10-CM | POA: Diagnosis present

## 2024-02-26 DIAGNOSIS — I10 Essential (primary) hypertension: Secondary | ICD-10-CM | POA: Diagnosis present

## 2024-02-26 DIAGNOSIS — K838 Other specified diseases of biliary tract: Secondary | ICD-10-CM | POA: Diagnosis not present

## 2024-02-26 DIAGNOSIS — H6690 Otitis media, unspecified, unspecified ear: Principal | ICD-10-CM | POA: Diagnosis present

## 2024-02-26 DIAGNOSIS — I160 Hypertensive urgency: Secondary | ICD-10-CM

## 2024-02-26 DIAGNOSIS — R0602 Shortness of breath: Secondary | ICD-10-CM | POA: Diagnosis not present

## 2024-02-26 DIAGNOSIS — I82411 Acute embolism and thrombosis of right femoral vein: Secondary | ICD-10-CM | POA: Insufficient documentation

## 2024-02-26 DIAGNOSIS — R413 Other amnesia: Secondary | ICD-10-CM | POA: Diagnosis present

## 2024-02-26 DIAGNOSIS — R634 Abnormal weight loss: Secondary | ICD-10-CM | POA: Diagnosis present

## 2024-02-26 DIAGNOSIS — K449 Diaphragmatic hernia without obstruction or gangrene: Secondary | ICD-10-CM | POA: Diagnosis not present

## 2024-02-26 DIAGNOSIS — E782 Mixed hyperlipidemia: Secondary | ICD-10-CM | POA: Diagnosis present

## 2024-02-26 DIAGNOSIS — Z791 Long term (current) use of non-steroidal anti-inflammatories (NSAID): Secondary | ICD-10-CM | POA: Diagnosis not present

## 2024-02-26 DIAGNOSIS — E876 Hypokalemia: Secondary | ICD-10-CM | POA: Diagnosis present

## 2024-02-26 DIAGNOSIS — Z888 Allergy status to other drugs, medicaments and biological substances status: Secondary | ICD-10-CM | POA: Diagnosis not present

## 2024-02-26 DIAGNOSIS — I421 Obstructive hypertrophic cardiomyopathy: Secondary | ICD-10-CM | POA: Diagnosis present

## 2024-02-26 DIAGNOSIS — K5730 Diverticulosis of large intestine without perforation or abscess without bleeding: Secondary | ICD-10-CM | POA: Diagnosis not present

## 2024-02-26 DIAGNOSIS — H6692 Otitis media, unspecified, left ear: Secondary | ICD-10-CM | POA: Diagnosis not present

## 2024-02-26 DIAGNOSIS — F419 Anxiety disorder, unspecified: Secondary | ICD-10-CM | POA: Diagnosis present

## 2024-02-26 DIAGNOSIS — Z882 Allergy status to sulfonamides status: Secondary | ICD-10-CM | POA: Diagnosis not present

## 2024-02-26 DIAGNOSIS — Z87820 Personal history of traumatic brain injury: Secondary | ICD-10-CM | POA: Diagnosis not present

## 2024-02-26 DIAGNOSIS — I2693 Single subsegmental pulmonary embolism without acute cor pulmonale: Secondary | ICD-10-CM | POA: Diagnosis not present

## 2024-02-26 DIAGNOSIS — I82452 Acute embolism and thrombosis of left peroneal vein: Secondary | ICD-10-CM | POA: Diagnosis present

## 2024-02-26 DIAGNOSIS — Z947 Corneal transplant status: Secondary | ICD-10-CM | POA: Diagnosis not present

## 2024-02-26 DIAGNOSIS — I82432 Acute embolism and thrombosis of left popliteal vein: Secondary | ICD-10-CM | POA: Insufficient documentation

## 2024-02-26 DIAGNOSIS — H9113 Presbycusis, bilateral: Secondary | ICD-10-CM | POA: Diagnosis present

## 2024-02-26 DIAGNOSIS — Z1152 Encounter for screening for COVID-19: Secondary | ICD-10-CM | POA: Diagnosis not present

## 2024-02-26 DIAGNOSIS — R4182 Altered mental status, unspecified: Secondary | ICD-10-CM | POA: Diagnosis not present

## 2024-02-26 DIAGNOSIS — R0689 Other abnormalities of breathing: Secondary | ICD-10-CM | POA: Diagnosis not present

## 2024-02-26 DIAGNOSIS — D7389 Other diseases of spleen: Secondary | ICD-10-CM | POA: Diagnosis not present

## 2024-02-26 DIAGNOSIS — R296 Repeated falls: Secondary | ICD-10-CM | POA: Diagnosis present

## 2024-02-26 DIAGNOSIS — R059 Cough, unspecified: Secondary | ICD-10-CM | POA: Diagnosis not present

## 2024-02-26 DIAGNOSIS — Z6824 Body mass index (BMI) 24.0-24.9, adult: Secondary | ICD-10-CM | POA: Diagnosis not present

## 2024-02-26 DIAGNOSIS — Z8249 Family history of ischemic heart disease and other diseases of the circulatory system: Secondary | ICD-10-CM | POA: Diagnosis not present

## 2024-02-26 DIAGNOSIS — F1120 Opioid dependence, uncomplicated: Secondary | ICD-10-CM | POA: Diagnosis present

## 2024-02-26 DIAGNOSIS — Z743 Need for continuous supervision: Secondary | ICD-10-CM | POA: Diagnosis not present

## 2024-02-26 DIAGNOSIS — I2602 Saddle embolus of pulmonary artery with acute cor pulmonale: Secondary | ICD-10-CM | POA: Diagnosis not present

## 2024-02-26 DIAGNOSIS — Z87891 Personal history of nicotine dependence: Secondary | ICD-10-CM | POA: Diagnosis not present

## 2024-02-26 LAB — BASIC METABOLIC PANEL WITH GFR
Anion gap: 12 (ref 5–15)
BUN: 21 mg/dL (ref 8–23)
CO2: 26 mmol/L (ref 22–32)
Calcium: 10.1 mg/dL (ref 8.9–10.3)
Chloride: 107 mmol/L (ref 98–111)
Creatinine, Ser: 0.87 mg/dL (ref 0.44–1.00)
GFR, Estimated: >60 mL/min (ref >60)
Glucose, Bld: 102 mg/dL — ABNORMAL HIGH (ref 70–99)
Potassium: 3.5 mmol/L (ref 3.5–5.1)
Sodium: 145 mmol/L (ref 135–145)

## 2024-02-26 LAB — CBC WITH DIFFERENTIAL/PLATELET
Abs Immature Granulocytes: 0.04 K/uL (ref 0.00–0.07)
Basophils Absolute: 0 K/uL (ref 0.0–0.1)
Basophils Relative: 0 %
Eosinophils Absolute: 0.2 K/uL (ref 0.0–0.5)
Eosinophils Relative: 2 %
HCT: 40.1 % (ref 36.0–46.0)
Hemoglobin: 14 g/dL (ref 12.0–15.0)
Immature Granulocytes: 0 %
Lymphocytes Relative: 13 %
Lymphs Abs: 1.3 K/uL (ref 0.7–4.0)
MCH: 31 pg (ref 26.0–34.0)
MCHC: 34.9 g/dL (ref 30.0–36.0)
MCV: 88.9 fL (ref 80.0–100.0)
Monocytes Absolute: 0.6 K/uL (ref 0.1–1.0)
Monocytes Relative: 7 %
Neutro Abs: 7.7 K/uL (ref 1.7–7.7)
Neutrophils Relative %: 78 %
Platelets: 169 K/uL (ref 150–400)
RBC: 4.51 MIL/uL (ref 3.87–5.11)
RDW: 12 % (ref 11.5–15.5)
WBC: 9.9 K/uL (ref 4.0–10.5)
nRBC: 0 % (ref 0.0–0.2)

## 2024-02-26 LAB — TROPONIN T, HIGH SENSITIVITY: Troponin T High Sensitivity: 31 ng/L — ABNORMAL HIGH (ref 0–19)

## 2024-02-26 LAB — D-DIMER, QUANTITATIVE: D-Dimer, Quant: 5.15 ug{FEU}/mL — ABNORMAL HIGH (ref 0.00–0.50)

## 2024-02-26 LAB — PRO BRAIN NATRIURETIC PEPTIDE: Pro Brain Natriuretic Peptide: 538 pg/mL — ABNORMAL HIGH (ref <300.0)

## 2024-02-26 LAB — RESP PANEL BY RT-PCR (RSV, FLU A&B, COVID)  RVPGX2
Influenza A by PCR: NEGATIVE
Influenza B by PCR: NEGATIVE
Resp Syncytial Virus by PCR: NEGATIVE
SARS Coronavirus 2 by RT PCR: NEGATIVE

## 2024-02-26 LAB — HEPARIN LEVEL (UNFRACTIONATED): Heparin Unfractionated: 0.68 [IU]/mL (ref 0.30–0.70)

## 2024-02-26 MED ORDER — LORATADINE 10 MG PO TABS
10.0000 mg | ORAL_TABLET | Freq: Every day | ORAL | Status: DC
  Administered 2024-02-26 – 2024-02-29 (×4): 10 mg via ORAL
  Filled 2024-02-26 (×4): qty 1

## 2024-02-26 MED ORDER — OXYCODONE-ACETAMINOPHEN 5-325 MG PO TABS
1.0000 | ORAL_TABLET | Freq: Once | ORAL | Status: AC
  Administered 2024-02-26: 1 via ORAL
  Filled 2024-02-26: qty 1

## 2024-02-26 MED ORDER — ACETAMINOPHEN 650 MG RE SUPP: 650.0000 mg | Freq: Four times a day (QID) | RECTAL | Status: DC | PRN

## 2024-02-26 MED ORDER — MORPHINE SULFATE (PF) 2 MG/ML IV SOLN: 2.0000 mg | INTRAVENOUS | Status: DC | PRN

## 2024-02-26 MED ORDER — OXYCODONE HCL 5 MG PO TABS
10.0000 mg | ORAL_TABLET | Freq: Four times a day (QID) | ORAL | Status: DC
  Administered 2024-02-26: 10 mg via ORAL
  Filled 2024-02-26 (×2): qty 2

## 2024-02-26 MED ORDER — OXYCODONE-ACETAMINOPHEN 5-325 MG PO TABS
1.0000 | ORAL_TABLET | Freq: Three times a day (TID) | ORAL | 0 refills | Status: DC | PRN
  Filled 2024-02-26 – 2024-02-29 (×2): qty 15, 5d supply, fill #0

## 2024-02-26 MED ORDER — KETOROLAC TROMETHAMINE 60 MG/2ML IM SOLN
30.0000 mg | Freq: Once | INTRAMUSCULAR | Status: AC
  Administered 2024-02-26: 30 mg via INTRAMUSCULAR
  Filled 2024-02-26: qty 2

## 2024-02-26 MED ORDER — LORAZEPAM 1 MG PO TABS
0.5000 mg | ORAL_TABLET | Freq: Once | ORAL | Status: AC
  Administered 2024-02-26: 0.5 mg via ORAL
  Filled 2024-02-26: qty 1

## 2024-02-26 MED ORDER — GUAIFENESIN ER 600 MG PO TB12
600.0000 mg | ORAL_TABLET | Freq: Two times a day (BID) | ORAL | Status: DC
  Administered 2024-02-26 – 2024-02-29 (×6): 600 mg via ORAL
  Filled 2024-02-26 (×6): qty 1

## 2024-02-26 MED ORDER — DOXYCYCLINE HYCLATE 100 MG PO CAPS
100.0000 mg | ORAL_CAPSULE | Freq: Two times a day (BID) | ORAL | 0 refills | Status: DC
  Filled 2024-02-26 – 2024-02-29 (×2): qty 20, 10d supply, fill #0

## 2024-02-26 MED ORDER — METOPROLOL SUCCINATE ER 50 MG PO TB24
50.0000 mg | ORAL_TABLET | Freq: Every day | ORAL | Status: DC
  Administered 2024-02-27 – 2024-02-29 (×3): 50 mg via ORAL
  Filled 2024-02-26 (×3): qty 1

## 2024-02-26 MED ORDER — TRAZODONE HCL 50 MG PO TABS
150.0000 mg | ORAL_TABLET | Freq: Every day | ORAL | Status: DC
  Administered 2024-02-26 – 2024-02-28 (×3): 150 mg via ORAL
  Filled 2024-02-26 (×3): qty 1

## 2024-02-26 MED ORDER — ACETAMINOPHEN 325 MG PO TABS
650.0000 mg | ORAL_TABLET | Freq: Four times a day (QID) | ORAL | Status: DC | PRN
  Administered 2024-02-28: 650 mg via ORAL
  Filled 2024-02-26: qty 2

## 2024-02-26 MED ORDER — HYDRALAZINE HCL 20 MG/ML IJ SOLN
10.0000 mg | Freq: Four times a day (QID) | INTRAMUSCULAR | Status: DC | PRN
  Administered 2024-02-26 – 2024-02-29 (×2): 10 mg via INTRAVENOUS
  Filled 2024-02-26 (×2): qty 1

## 2024-02-26 MED ORDER — HYDROCODONE-ACETAMINOPHEN 5-325 MG PO TABS
1.0000 | ORAL_TABLET | ORAL | Status: DC | PRN
  Administered 2024-02-27: 1 via ORAL
  Filled 2024-02-26: qty 1

## 2024-02-26 MED ORDER — PREDNISOLONE ACETATE 1 % OP SUSP
1.0000 [drp] | Freq: Every day | OPHTHALMIC | Status: DC
  Administered 2024-02-27 – 2024-02-29 (×3): 1 [drp] via OPHTHALMIC
  Filled 2024-02-26: qty 5

## 2024-02-26 MED ORDER — HEPARIN (PORCINE) 25000 UT/250ML-% IV SOLN
1300.0000 [IU]/h | INTRAVENOUS | Status: DC
  Administered 2024-02-26 – 2024-02-27 (×2): 1300 [IU]/h via INTRAVENOUS
  Filled 2024-02-26 (×2): qty 250

## 2024-02-26 MED ORDER — KETOROLAC TROMETHAMINE 15 MG/ML IJ SOLN
15.0000 mg | Freq: Four times a day (QID) | INTRAMUSCULAR | Status: DC | PRN
  Administered 2024-02-26: 15 mg via INTRAVENOUS
  Filled 2024-02-26: qty 1

## 2024-02-26 MED ORDER — IOHEXOL 350 MG/ML SOLN
100.0000 mL | Freq: Once | INTRAVENOUS | Status: AC | PRN
  Administered 2024-02-26: 76 mL via INTRAVENOUS

## 2024-02-26 MED ORDER — ONDANSETRON HCL 4 MG PO TABS: 4.0000 mg | ORAL_TABLET | Freq: Four times a day (QID) | ORAL | Status: DC | PRN

## 2024-02-26 MED ORDER — SENNOSIDES-DOCUSATE SODIUM 8.6-50 MG PO TABS: 1.0000 | ORAL_TABLET | Freq: Every evening | ORAL | Status: DC | PRN

## 2024-02-26 MED ORDER — ONDANSETRON HCL 4 MG/2ML IJ SOLN: 4.0000 mg | Freq: Four times a day (QID) | INTRAMUSCULAR | Status: DC | PRN

## 2024-02-26 MED ORDER — FENOFIBRATE 160 MG PO TABS
160.0000 mg | ORAL_TABLET | Freq: Every day | ORAL | Status: DC
  Administered 2024-02-27 – 2024-02-29 (×3): 160 mg via ORAL
  Filled 2024-02-26 (×3): qty 1

## 2024-02-26 MED ORDER — FLUTICASONE PROPIONATE 50 MCG/ACT NA SUSP
2.0000 | Freq: Every day | NASAL | Status: DC
  Administered 2024-02-26: 2 via NASAL
  Filled 2024-02-26 (×2): qty 16

## 2024-02-26 MED ORDER — ACETAMINOPHEN 325 MG PO TABS
650.0000 mg | ORAL_TABLET | Freq: Once | ORAL | Status: AC
  Administered 2024-02-26: 650 mg via ORAL
  Filled 2024-02-26: qty 2

## 2024-02-26 MED ORDER — DOXYCYCLINE HYCLATE 100 MG PO TABS
100.0000 mg | ORAL_TABLET | Freq: Two times a day (BID) | ORAL | Status: DC
  Administered 2024-02-26 (×2): 100 mg via ORAL
  Filled 2024-02-26 (×2): qty 1

## 2024-02-26 MED ORDER — HEPARIN BOLUS VIA INFUSION
4500.0000 [IU] | Freq: Once | INTRAVENOUS | Status: AC
  Administered 2024-02-26: 4500 [IU] via INTRAVENOUS

## 2024-02-26 MED ORDER — MELOXICAM 15 MG PO TABS
15.0000 mg | ORAL_TABLET | Freq: Every day | ORAL | 3 refills | Status: DC
  Filled 2024-02-26: qty 15, 15d supply, fill #0

## 2024-02-26 MED ORDER — SALINE SPRAY 0.65 % NA SOLN
1.0000 | NASAL | Status: DC | PRN
  Filled 2024-02-26: qty 44

## 2024-02-26 NOTE — Assessment & Plan Note (Addendum)
 Patient notes dyspnea on exertion recently and shortness of breath at rest yesterday, no pleuritic chest pain.  Hemodynamically stable and saturating well on room air. --Continue IV heparin --Echocardiogram --Venous Doppler ultrasound --Telemetry --Monitor hemodynamics and oxygenation closely --Transition to DOAC on discharge --Of note, patient has prior history of subdural hematoma from a fall.  Discussed in detail with patient, benefits of anticoagulation at this time outweigh risk of bleeding.  Discussed diligent fall precautions going forward.

## 2024-02-26 NOTE — ED Triage Notes (Signed)
 Pt comes via [facility] from [facility] for R ear pain that has been going on all night with a headache and congestion

## 2024-02-26 NOTE — Progress Notes (Signed)
  Progress Note   Patient: Brittany Mercer FMW:[id number] DOB: Oct 23, 1945 DOA: 02/26/2024     1 DOS: the patient was seen and examined on 02/26/2024  The patient was seen and examined after [facility] the [facility].  She still having right ear pain, headache, sinus and nasal congestion.  Explained that she has analgesic medications ordered.  Assessment and plan reviewed with the patient.  Physical Exam: Vitals:   02/26/24 1100 02/26/24 1115 02/26/24 1145 02/26/24 1331  BP: (!) 159/79 (!) 158/84 (!) 152/83 (!) 179/96  Pulse: 75 71 76 70  Resp: (!) 26 (!) 25 (!) 23 19  Temp:    97.7 F (36.5 C)  TempSrc:    Oral  SpO2: 98% 98% 100% 99%  Weight:      Height:       Physical Exam Vitals reviewed.  Constitutional:      Appearance: She is ill-appearing.  HENT:     Head: Normocephalic.     Right Ear: Swelling and tenderness present. Tympanic membrane is injected and bulging. Tympanic membrane is not perforated.     Nose: No rhinorrhea.  Eyes:     General: No scleral icterus.    Pupils: Pupils are equal, round, and reactive to light.  Cardiovascular:     Rate and Rhythm: Normal rate and regular rhythm.  Pulmonary:     Effort: Pulmonary effort is normal.     Breath sounds: Normal breath sounds. No wheezing, rhonchi or rales.  Abdominal:     General: Bowel sounds are normal. There is no distension.     Palpations: Abdomen is soft.  Musculoskeletal:     Cervical back: Neck supple.     Right lower leg: No edema.     Left lower leg: No edema.  Skin:    General: Skin is warm and dry.  Neurological:     Mental Status: She is alert and oriented to person, place, and time.    Time spent: 20 minutes  Author: Alm Dorn Castor, MD 02/26/2024 2:36 PM  For on call review [email or web address].   This document was prepared using Dragon voice recognition software and may contain some unintended transcription errors.

## 2024-02-26 NOTE — Assessment & Plan Note (Signed)
 Noted

## 2024-02-26 NOTE — Assessment & Plan Note (Signed)
 No acute issues.  Appears not to be on medication for this

## 2024-02-26 NOTE — Progress Notes (Signed)
 Bilateral lower extremity venous duplex has been completed. Preliminary results can be found in CV Proc through chart review.  Results were given to Dr. Fausto.  02/26/24 3:36 PM Cathlyn Collet RVT

## 2024-02-26 NOTE — Assessment & Plan Note (Addendum)
 BP is elevated in the ED initially 170/139, later 204/98.  No evidence of complications at this time. --IV hydralazine  as needed --Resume home metoprolol  (from home regimen when med history complete) -- Monitor BP closely and titrate regimen

## 2024-02-26 NOTE — Progress Notes (Signed)
 PHARMACY - ANTICOAGULATION CONSULT NOTE  Pharmacy Consult for heparin Indication: pulmonary embolus  Allergies  Allergen Reactions   Keflex  [Cephalexin ]     GI, couldn't eat/drink, felt very sick   Misc. Sulfonamide Containing Compounds    Statins Other (See Comments)    Myalgia, turns red   Zetia  [Ezetimibe ] Other (See Comments)    Myopathy/redness   Other Rash   Penicillins Rash and Other (See Comments)    Pt not sure if she is allergic to penicillin or sulfa. So she does not take either one.  DID THE REACTION INVOLVE: Swelling of the face/tongue/throat, SOB, or low BP? Unknown Sudden or severe rash/hives, skin peeling, or the inside of the mouth or nose? No Did it require medical treatment? No When did it last happen?      childhood allergy If all above answers are "NO", may proceed with cephalosporin use.   Sulfa Antibiotics Rash and Other (See Comments)    Pt not sure if she is allergic to sulfa or penicillin so she doesn't take either one.     Patient Measurements: Height: 5' 9 (175.3 cm) Weight: 76 kg (167 lb 8.8 oz) IBW/kg (Calculated) : 66.2 HEPARIN DW (KG): 76  Vital Signs: Temp: 97.7 F (36.5 C) (12/02 1331) Temp Source: Oral (12/02 1331) BP: 179/96 (12/02 1331) Pulse Rate: 70 (12/02 1331)  Labs: Recent Labs    02/26/24 0613  HGB 14.0  HCT 40.1  PLT 169  CREATININE 0.87    Estimated Creatinine Clearance: 56.6 mL/min (by C-G formula based on SCr of 0.87 mg/dL).   Medical History: Past Medical History:  Diagnosis Date   Allergy    Cataract    Chronic narcotic use    takes Nucynta  for neuropathic pain   Headache    Hereditary and idiopathic peripheral neuropathy    followed by pcp   Hyperlipidemia    Hypertension    Hypokalemia    OA (osteoarthritis)    LEFT KNEE   Peripheral neuropathy     Medications:  Infusions:   heparin 1,300 Units/hr (02/26/24 0841)    Assessment: 78 yof presented to the ED with ear pain and congestion. Found  to have a bilateral PE with right heart strain and starting IV heparin. Baseline CBC is WNL and she is not on anticoagulation PTA. Noted history of traumatic subdural hematoma.   Goal of Therapy:  Heparin level 0.3-0.7 units/ml Monitor platelets by anticoagulation protocol: Yes   Plan:  Heparin bolus 4500 units IV x 1 Heparin gtt 1300 units/hr Check an 8 hr heparin level Daily heparin level and CBC  Vernell Meier, PharmD, BCPS, BCEMP Clinical Pharmacist Please see AMION for all pharmacy numbers

## 2024-02-26 NOTE — Assessment & Plan Note (Addendum)
 PDMP reviewed and appropriate.  Patient is prescribed Nucynta  100 mg, 30-day supply filled on 11/17. -- Oxycodone  substituted for home Nucynta  --IV Toradol  as needed, holding meloxicam 

## 2024-02-26 NOTE — ED Notes (Signed)
 Report called to Stephania, [facility] at [facility].

## 2024-02-26 NOTE — ED Notes (Signed)
 Attempted to call report to [facility]. Left number with floor. Floor to call back for report.

## 2024-02-26 NOTE — Assessment & Plan Note (Addendum)
 Resume home fenofibrate . Does not appear to be on statin.

## 2024-02-26 NOTE — Assessment & Plan Note (Signed)
 Patient notes 25 pound weight loss in a short amount of time last year.  States this was evaluated without cause being found, included colonoscopy.  She reports typically being up-to-date on mammograms but has not had one yet this year. --Recommend mammogram be scheduled, discussed with patient --Given acute PEs further evaluation for occult malignancy is warranted

## 2024-02-26 NOTE — ED Provider Notes (Signed)
 [place] EMERGENCY DEPARTMENT AT [facility] Provider Note   CSN: [id number] Arrival date & time: 02/26/24  9491     Patient presents with: Otalgia   Brittany Mercer is a 78 y.o. female.    Otalgia Associated symptoms: cough      78 year old female with medical history significant for HTN, HLD presenting to the emergency department with otalgia.  The patient states that she has had symptoms of a URI with cough and nasal congestion for the last few days with associated headache.  She has since developed severe ear pain on the right.  She endorses a pressure sensation and sharp pain.  No fevers or chills.  Patient also states that she has been having a cough with associated shortness of breath for the past few days.  She denies chest pain.  Prior to Admission medications   Medication Sig Start Date End Date Taking? Authorizing Provider  doxycycline  (VIBRAMYCIN ) 100 MG capsule Take 1 capsule (100 mg total) by mouth 2 (two) times daily. 02/26/24  Yes Jerrol Agent, MD  oxyCODONE -acetaminophen  (PERCOCET/ROXICET) 5-325 MG tablet Take 1 tablet by mouth every 8 (eight) hours as needed for severe pain (pain score 7-10). 02/26/24  Yes Jerrol Agent, MD  fenofibrate  160 MG tablet TAKE 1 TABLET BY MOUTH EVERY DAY 02/12/24   Zollie Lowers, MD  meloxicam  (MOBIC ) 15 MG tablet Take 1 tablet (15 mg total) by mouth daily. 02/26/24   Jerrol Agent, MD  metoprolol  succinate (TOPROL -XL) 50 MG 24 hr tablet Take with or immediately following a meal. 07/10/23   Zollie Lowers, MD  ondansetron  (ZOFRAN -ODT) 4 MG disintegrating tablet Take 1 tablet (4 mg total) by mouth every 8 (eight) hours as needed for nausea or vomiting. Take 1 tablet every 8 hours for nausea 11/22/22   Nandigam, Kavitha V, MD  potassium chloride  (KLOR-CON ) 10 MEQ tablet TAKE 1 TABLET BY MOUTH 2 TIMES DAILY. 02/12/24   Zollie Lowers, MD  prednisoLONE  acetate (PRED FORTE ) 1 % ophthalmic suspension Place 1 drop into the right  eye daily. 12/01/22     Tapentadol  HCl (NUCYNTA ) 100 MG TABS Take 1 tablet (100 mg total) by mouth 4 (four) times daily. 01/09/24 02/09/24  Zollie Lowers, MD  Tapentadol  HCl (NUCYNTA ) 100 MG TABS Take 1 tablet (100 mg total) by mouth 4 (four) times daily. 02/08/24 03/12/24  Zollie Lowers, MD  Tapentadol  HCl (NUCYNTA ) 100 MG TABS Take 1 tablet (100 mg total) by mouth 4 (four) times daily. 04/08/24 05/08/24  Zollie Lowers, MD  traZODone  (DESYREL ) 150 MG tablet Take 1 tablet (150 mg total) by mouth at bedtime. 10/09/23   Zollie Lowers, MD    Allergies: Keflex  [cephalexin ], Misc. sulfonamide containing compounds, Statins, Zetia  [ezetimibe ], Other, Penicillins, and Sulfa antibiotics    Review of Systems  HENT:  Positive for ear pain.   Respiratory:  Positive for cough and shortness of breath.   All other systems reviewed and are negative.   Updated Vital Signs BP (!) 170/139   Pulse 77   Temp 97.6 F (36.4 C) (Temporal)   Resp 18   SpO2 98%   Physical Exam Vitals and nursing note reviewed.  Constitutional:      General: She is not in acute distress.    Comments: Appears uncomfortable  HENT:     Head: Normocephalic and atraumatic.     Right Ear: Tympanic membrane is erythematous and bulging.     Left Ear: Tympanic membrane, ear canal and external ear normal.  Ears:     Comments: Right TM bulging and erythematous Eyes:     Conjunctiva/sclera: Conjunctivae normal.     Pupils: Pupils are equal, round, and reactive to light.  Cardiovascular:     Rate and Rhythm: Normal rate and regular rhythm.  Pulmonary:     Effort: Pulmonary effort is normal. No respiratory distress.  Abdominal:     General: There is no distension.     Tenderness: There is no guarding.  Musculoskeletal:        General: No deformity or signs of injury.     Cervical back: Neck supple.  Skin:    Findings: No lesion or rash.  Neurological:     General: No focal deficit present.     Mental Status: She is  alert. Mental status is at baseline.     Comments: CN intact with no facial droop     (all labs ordered are listed, but only abnormal results are displayed) Labs Reviewed  BASIC METABOLIC PANEL WITH GFR - Abnormal; Notable for the following components:      Result Value   Glucose, Bld 102 (*)    All other components within normal limits  PRO BRAIN NATRIURETIC PEPTIDE - Abnormal; Notable for the following components:   Pro Brain Natriuretic Peptide 538.0 (*)    All other components within normal limits  D-DIMER, QUANTITATIVE - Abnormal; Notable for the following components:   D-Dimer, Quant 5.15 (*)    All other components within normal limits  TROPONIN T, HIGH SENSITIVITY - Abnormal; Notable for the following components:   Troponin T High Sensitivity 31 (*)    All other components within normal limits  RESP PANEL BY RT-PCR (RSV, FLU A&B, COVID)  [id number]  CBC WITH DIFFERENTIAL/PLATELET    EKG: EKG Interpretation Date/Time:  Tuesday February 26 2024 06:34:53 EST Ventricular Rate:  61 PR Interval:  161 QRS Duration:  86 QT Interval:  436 QTC Calculation: 440 R Axis:   -43  Text Interpretation: Sinus rhythm No significant change since last tracing Confirmed by Jerrol Agent ([id number]) on 02/26/2024 6:40:54 AM  Radiology: DG Chest 2 View Result Date: 02/26/2024 EXAM: 2 VIEW(S) XRAY OF THE CHEST 02/26/2024 06:09:00 AM COMPARISON: CT chest 10/18/2022 CLINICAL HISTORY: SOB FINDINGS: LUNGS AND PLEURA: No focal pulmonary opacity. No pleural effusion. No pneumothorax. HEART AND MEDIASTINUM: No acute abnormality of the cardiac and mediastinal silhouettes. BONES AND SOFT TISSUES: Degenerative changes of thoracic spine. S-shaped thoracolumbar curvature. IMPRESSION: 1. No acute cardiopulmonary abnormality to explain shortness of breath. Electronically signed by: Waddell Calk MD 02/26/2024 06:17 AM EST RP Workstation: [id number]     Procedures   Medications Ordered in the ED   oxyCODONE -acetaminophen  (PERCOCET/ROXICET) 5-325 MG per tablet 1 tablet (1 tablet Oral Given 02/26/24 0526)  acetaminophen  (TYLENOL ) tablet 650 mg (650 mg Oral Given 02/26/24 0526)  ketorolac  (TORADOL ) injection 30 mg (30 mg Intramuscular Given 02/26/24 0526)  iohexol  (OMNIPAQUE ) 350 MG/ML injection 100 mL (76 mLs Intravenous Contrast Given 02/26/24 0749)                                    Medical Decision Making Amount and/or Complexity of Data Reviewed Labs: ordered. Radiology: ordered.  Risk OTC drugs. Prescription drug management.    78 year old female with medical history significant for HTN, HLD presenting to the emergency department with otalgia.  The patient states that she has had symptoms of a URI with  cough and nasal congestion for the last few days with associated headache.  She has since developed severe ear pain on the right.  She endorses a pressure sensation and sharp pain.  No fevers or chills.  Patient also states that she has been having a cough with associated shortness of breath for the past few days.  She denies chest pain.  On arrival, the patient was afebrile, temperature 97.6, heart rate 77, respiratory rate 18, BP 170/139 in the setting of acute pain, O2 saturations 90% on room air.  On exam the patient had a bulging TM on the right.  Clear lungs auscultation, no cranial nerve deficit.  Patient symptoms most consistent with viral upper respiratory infection with associated otitis media.  Given significant bulging, will treat with a course of antibiotics.  Patient has known allergy to penicillins and cephalosporins, will prescribe doxycycline .  Patient administered oxycodone  and Toradol  for pain control.  Patient endorsing dyspnea over the past few days, endorses cough.  An EKG was performed revealed sinus rhythm, ventricular rate 61, no acute ischemic changes.  A chest x-ray was performed which revealed no acute cardiopulmonary abnormality.  COVID, flu, RSV PCR  testing was collected and resulted negative, CBC was without a leukocytosis or anemia, BMP was unremarked initial cardiac troponin was 31, pro BNP was normal after adjustment for age at 64, D-dimer was elevated at 5.15.  In the setting of this, further evaluation via CT imaging was obtained, CT a PE study was ordered.  Signout was given to Dr. Mannie at 0 700 to follow results of CT imaging, ultimate disposition was pending results of diagnostic testing and repeat assessment.     Final diagnoses:  Acute otitis media, unspecified otitis media type    ED Discharge Orders          Ordered    doxycycline  (VIBRAMYCIN ) 100 MG capsule  2 times daily        02/26/24 0626    oxyCODONE -acetaminophen  (PERCOCET/ROXICET) 5-325 MG tablet  Every 8 hours PRN        02/26/24 0654    meloxicam  (MOBIC ) 15 MG tablet  Daily        02/26/24 0654               Jerrol Agent, MD 02/26/24 [phone]

## 2024-02-26 NOTE — Progress Notes (Signed)
 PHARMACY - ANTICOAGULATION CONSULT NOTE  Pharmacy Consult for heparin Indication: pulmonary embolus  Allergies  Allergen Reactions   Keflex  [Cephalexin ]     GI, couldn't eat/drink, felt very sick   Misc. Sulfonamide Containing Compounds    Statins Other (See Comments)    Myalgia, turns red   Zetia  [Ezetimibe ] Other (See Comments)    Myopathy/redness   Other Rash   Penicillins Rash and Other (See Comments)    Pt not sure if she is allergic to penicillin or sulfa. So she does not take either one.  DID THE REACTION INVOLVE: Swelling of the face/tongue/throat, SOB, or low BP? Unknown Sudden or severe rash/hives, skin peeling, or the inside of the mouth or nose? No Did it require medical treatment? No When did it last happen?      childhood allergy If all above answers are "NO", may proceed with cephalosporin use.   Sulfa Antibiotics Rash and Other (See Comments)    Pt not sure if she is allergic to sulfa or penicillin so she doesn't take either one.     Patient Measurements: Height: 5' 9 (175.3 cm) Weight: 76 kg (167 lb 8.8 oz) IBW/kg (Calculated) : 66.2 HEPARIN DW (KG): 76  Vital Signs: Temp: 97.7 F (36.5 C) (12/02 1331) Temp Source: Oral (12/02 1331) BP: 179/96 (12/02 1331) Pulse Rate: 70 (12/02 1331)  Labs: Recent Labs    02/26/24 0613  HGB 14.0  HCT 40.1  PLT 169  CREATININE 0.87    Estimated Creatinine Clearance: 56.6 mL/min (by C-G formula based on SCr of 0.87 mg/dL).   Medical History: Past Medical History:  Diagnosis Date   Allergy    Cataract    Chronic narcotic use    takes Nucynta  for neuropathic pain   Headache    Hereditary and idiopathic peripheral neuropathy    followed by pcp   Hyperlipidemia    Hypertension    Hypokalemia    OA (osteoarthritis)    LEFT KNEE   Peripheral neuropathy     Medications: No anticoagulants PTA  Assessment: Pt is a 44 yoF presenting with otalgia. CTA revealed multiple bilateral PE, suggestive of RHS.  Venous doppler + bilateral acute DVT. 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   Pharmacy consulted to dose heparin.   Today, 02/26/24 Heparin level = 0.68 is therapeutic on heparin infusion of 1300 units/hr No bleeding or complications reported  Goal of Therapy:  Heparin level 0.3-0.7 units/ml Monitor platelets by anticoagulation protocol: Yes   Plan:  Continue heparin infusion at 1300 units/hr  Check confirmatory 8 hour heparin level  CBC, heparin level daily Monitor for signs of bleeding  Ronal CHRISTELLA Rav, PharmD 02/26/2024,2:09 PM

## 2024-02-26 NOTE — Assessment & Plan Note (Signed)
 Secondary to fall which patient reports history of recurrent falls in the setting of neuropathy.   --Monitor closely on anticoagulation. -- PT evaluation  -- Fall precautions

## 2024-02-26 NOTE — Assessment & Plan Note (Addendum)
 Med history pending but does not appear to be on gabapentin or Lyrica for this.  Appears she takes meloxicam  and Nucynta . -- Fall precautions and PT eval

## 2024-02-26 NOTE — Progress Notes (Signed)
 PT Cancellation Note  Patient Details Name: Brittany Mercer MRN: [id number] DOB: 1945-11-16   Cancelled Treatment:    Reason Eval/Treat Not Completed: Patient not medically ready  New PE ,started on  Heparin. Per Therapy  clinical guidelines, PT to initiate  after 24  hours on Hep unless otherwise ordered by MD.   Will follow  tomorrow. Darice Potters PT Acute Rehabilitation Services Office [phone]  Potters Darice Norris 02/26/2024, 3:08 PM

## 2024-02-26 NOTE — Assessment & Plan Note (Signed)
-  See hypertensive urgency

## 2024-02-26 NOTE — ED Notes (Signed)
 Called [facility] to transport the patient to [facility] rm# [phone]

## 2024-02-26 NOTE — Assessment & Plan Note (Addendum)
 Likely contributes to her neuropathy and falls

## 2024-02-26 NOTE — ED Provider Notes (Signed)
 Patient with bilateral pulmonary emboli with evidence of right heart strain.  Have started the patient on heparin.  Patient does have a history of a traumatic subdural 1 year ago.  Is obviously not ideal to start this patient on anticoagulation, however given that it was a traumatic subdural 1 year ago, the benefits of anticoagulation clearly far outweigh the risks of potential rebleed.  Patient does endorse an illness over the last few days, potentially cause for her pulmonary emboli.  She does also report that she has lost some weight recently, possibly occult malignancy.  Admit to hospitalist service.   Mannie Pac T, DO 02/26/24 [phone]

## 2024-02-26 NOTE — Discharge Instructions (Addendum)
 Information on my medicine - ELIQUIS  (apixaban )  This medication education was reviewed with me or my healthcare representative as part of my discharge preparation.   Why was Eliquis  prescribed for you? Eliquis  was prescribed to treat blood clots that may have been found in the veins of your legs (deep vein thrombosis) or in your lungs (pulmonary embolism) and to reduce the risk of them occurring again.  What do You need to know about Eliquis  ? The starting dose is 10 mg (two 5 mg tablets) taken TWICE daily for the FIRST SEVEN (7) DAYS, then on 03/07/24  the dose is reduced to ONE 5 mg tablet taken TWICE daily.  Eliquis  may be taken with or without food.   Try to take the dose about the same time in the morning and in the evening. If you have difficulty swallowing the tablet whole please discuss with your pharmacist how to take the medication safely.  Take Eliquis  exactly as prescribed and DO NOT stop taking Eliquis  without talking to the doctor who prescribed the medication.  Stopping may increase your risk of developing a new blood clot.  Refill your prescription before you run out.  After discharge, you should have regular check-up appointments with your healthcare provider that is prescribing your Eliquis .    What do you do if you miss a dose? If a dose of ELIQUIS  is not taken at the scheduled time, take it as soon as possible on the same day and twice-daily administration should be resumed. The dose should not be doubled to make up for a missed dose.  Important Safety Information A possible side effect of Eliquis  is bleeding. You should call your healthcare provider right away if you experience any of the following: Bleeding from an injury or your nose that does not stop. Unusual colored urine (red or dark brown) or unusual colored stools (red or black). Unusual bruising for unknown reasons. A serious fall or if you hit your head (even if there is no bleeding).  Some  medicines may interact with Eliquis  and might increase your risk of bleeding or clotting while on Eliquis . To help avoid this, consult your healthcare provider or pharmacist prior to using any new prescription or non-prescription medications, including herbals, vitamins, non-steroidal anti-inflammatory drugs (NSAIDs) and supplements.  This website has more information on Eliquis  (apixaban ): [email or web address] .[email or web address] dena

## 2024-02-26 NOTE — H&P (Addendum)
 Telemedicine History and Physical    Patient: Brittany Mercer FMW:[id number] DOB: [birth year] DOA: 02/26/2024 DOS: the patient was seen and examined on 02/26/2024 PCP: Zollie Lowers, MD    Referring Provider: Fairy Gravely, DO Telemedicine Provider: Burnard Cunning, DO Patient Location: [facility] Referring Diagnosis: Multiple acute pulmonary emobli Patient Name and DOB verified: Brittany Mercer, [birth year] Patient consented to Telemedicine Evaluation:Yes RN virtual assistant: Damien Don, RN Video encounter time and date: 02/26/2024  9:07 AM   Patient coming from: Home  Chief Complaint:  Chief Complaint  Patient presents with   Otalgia   HPI: Brittany Mercer is a 78 y.o. female with medical history significant of hypertension, hyperlipidemia, subdural hematoma after a fall, lumbar spondylosis, peripheral neuropathy who presented to [facility] for evaluation of severe right ear pain.  Patient reports upper respiratory cold symptoms since last Thursday or Friday with congestion, sore throat, cough and headaches.  Overnight last night patient developed worsening right ear pain described as pressure and sharp at times with diminished hearing.  She was supposed to have visitors over the weekend but canceled due to headache.  She does endorse some dyspnea on exertion with walking recently and felt somewhat short of breath at rest yesterday.  She denies any chest pain including with inspiration.  She denies fevers or chills but reports diminished appetite.  She denies significant pain or swelling in either of her legs but can have pain that comes and goes related to neuropathy.  Patient also reports weight loss of 25 pounds last year and a short amount of time.  This was evaluated without any cause found including colonoscopy.  She typically is up-to-date on mammograms but has not gotten one yet this year.  Patient reports living in independent living  facility since April.  She uses cane for ambulation sometimes due to history of falls related to her neuropathy.  Patient denies other recent symptoms or illnesses including abdominal pain, nausea, vomiting, diarrhea, dysuria or frequency one-sided weakness numbness or tingling, or speech changes.  Patient does not endorse significant anxiety related to her current situation, stating that yesterday she felt like she was dying.  She is feeling somewhat better today and when we discussed CODE STATUS she states wanting to be full code but would have given a different answer yesterday as she felt so poorly.  ED course --initial vitals 97.6 F, HR 77, RR 18, intermittently BP 170/139 and later 204/98, SpO2 96-100 % on room air. Labs obtained including BMP and CBC were notable only for nonfasting glucose 102 and otherwise normal.  proBNP was 538.  Troponin 31. D-dimer was elevated 5.15. Viral PCR's were negative for COVID, flu A/B and RSV. Imaging: 2 view chest x-ray with no acute findings.  CTA chest revealed bilateral pulmonary emboli involving left upper lobar, left upper segmental, lingular, left lower lobar, right lower lobar, proximal right upper lobar and right middle lobe segmental pulmonary arteries  ED treatment --patient was started on IV heparin and doxycycline  for acute otitis media based on EDP's exam finding of right tympanic membrane erythematous and bulging.  Patient was treated for pain with IV Toradol  and Percocet.  IV hydralazine  was given for BP and hypertensive urgency that is likely related to both pain and significant anxiety.  Patient is being admitted to the hospital on telemetry, will be continued on IV heparin and undergo further evaluation for her acute bilateral PEs as outlined in detail below.   Review of Systems:  As mentioned in the history of present illness. All other systems reviewed and are negative.  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   EYE SURGERY     KNEE ARTHROSCOPY Right x2  last one 2016   KNEE CLOSED REDUCTION Left 04/11/2018   Procedure: CLOSED MANIPULATION KNEE;  Surgeon: Ernie Cough, MD;  Location: [facility];  Service: Orthopedics;  Laterality: Left;   SHOULDER OPEN ROTATOR CUFF REPAIR Left 1990s   TONSILLECTOMY  age 20   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 Her smoking use included cigarettes. She started smoking about 55 years ago. She has a 15 pack-year smoking history. She has never used smokeless tobacco. She reports current alcohol use of about 4.0 standard drinks of alcohol per week. She reports that she does not use drugs.  Allergies  Allergen Reactions   Keflex  [Cephalexin ]     GI, couldn't eat/drink, felt very sick   Misc. Sulfonamide Containing Compounds    Statins Other (See Comments)    Myalgia, turns red   Zetia  [Ezetimibe ] Other (See Comments)    Myopathy/redness   Other Rash   Penicillins Rash and Other (See Comments)    Pt not sure if she is allergic to penicillin or sulfa. So she does not take either one.  DID THE REACTION  INVOLVE: Swelling of the face/tongue/throat, SOB, or low BP? Unknown Sudden or severe rash/hives, skin peeling, or the inside of the mouth or nose? No Did it require medical treatment? No When did it last happen?      childhood allergy If all above answers are "NO", may proceed with cephalosporin use.   Sulfa Antibiotics Rash and Other (See Comments)    Pt not sure if she is allergic to sulfa or penicillin so she doesn't take either one.     Family History  Problem Relation Age of Onset   Hypertension Mother    Hyperlipidemia Mother    Stroke Mother    Heart disease Mother    Hypertension Father    Dementia Father    Neuropathy Father    Hypertension Brother    Arthritis Brother    Colon cancer Neg Hx    Colon polyps Neg Hx    Esophageal cancer Neg Hx    Rectal cancer Neg Hx    Stomach cancer Neg Hx     Prior to Admission medications   Medication Sig Start Date End Date Taking? Authorizing Provider  doxycycline  (VIBRAMYCIN ) 100 MG capsule Take 1  capsule (100 mg total) by mouth 2 (two) times daily. 02/26/24  Yes Jerrol Agent, MD  oxyCODONE -acetaminophen  (PERCOCET/ROXICET) 5-325 MG tablet Take 1 tablet by mouth every 8 (eight) hours as needed for severe pain (pain score 7-10). 02/26/24  Yes Jerrol Agent, MD  fenofibrate  160 MG tablet TAKE 1 TABLET BY MOUTH EVERY DAY 02/12/24   Zollie Lowers, MD  meloxicam  (MOBIC ) 15 MG tablet Take 1 tablet (15 mg total) by mouth daily. 02/26/24   Jerrol Agent, MD  metoprolol  succinate (TOPROL -XL) 50 MG 24 hr tablet Take with or immediately following a meal. 07/10/23   Zollie Lowers, MD  ondansetron  (ZOFRAN -ODT) 4 MG disintegrating tablet Take 1 tablet (4 mg total) by mouth every 8 (eight) hours as needed for nausea or vomiting. Take 1 tablet every 8 hours for nausea 11/22/22   Nandigam, Kavitha V, MD  potassium chloride  (KLOR-CON ) 10 MEQ tablet TAKE 1 TABLET BY MOUTH 2 TIMES DAILY. 02/12/24   Zollie Lowers, MD  prednisoLONE  acetate (PRED FORTE )  1 % ophthalmic suspension Place 1 drop into the right eye daily. 12/01/22     Tapentadol  HCl (NUCYNTA ) 100 MG TABS Take 1 tablet (100 mg total) by mouth 4 (four) times daily. 01/09/24 02/09/24  Zollie Lowers, MD  Tapentadol  HCl (NUCYNTA ) 100 MG TABS Take 1 tablet (100 mg total) by mouth 4 (four) times daily. 02/08/24 03/12/24  Zollie Lowers, MD  Tapentadol  HCl (NUCYNTA ) 100 MG TABS Take 1 tablet (100 mg total) by mouth 4 (four) times daily. 04/08/24 05/08/24  Zollie Lowers, MD  traZODone  (DESYREL ) 150 MG tablet Take 1 tablet (150 mg total) by mouth at bedtime. 10/09/23   Zollie Lowers, MD    Physical Exam: Vitals:   02/26/24 0800 02/26/24 0831 02/26/24 0845 02/26/24 0900  BP:  (!) 204/98 (!) 192/78 (!) 181/82  Pulse:  69  65  Resp:  20 (!) 22 20  Temp:      TempSrc:      SpO2:  100%  96%  Weight: 76 kg     Height: 5' 9 (1.753 m)      Bedside physical exam was performed by RN listed above. Below exam findings are based on their in person physical exam findings and my observations during virtual encounter.  General exam: awake, alert, no acute distress, appears anxious HEENT: hearing grossly normal.   Respiratory system: Lung sounds diminished bilaterally and coarse in the bilateral upper lobes.  No wheezes.  Mildly tachypneic.  Not using accessory muscles.  On room air. Cardiovascular system: normal S1/S2, RRR, no JVD, no peripheral edema.   Gastrointestinal system: soft, NT, ND, no HSM felt, +bowel sounds. Central nervous system: A&O x3. no gross focal neurologic deficits, normal speech Extremities: moves all, no edema Skin: Bedside RN reports no rashes on visualized skin and skin dry Psychiatry: Anxious mood, congruent affect, judgement and insight appear normal   Data Reviewed:  As in studies as reviewed in detail above   Assessment and Plan:  * Acute pulmonary embolism (HCC) Patient notes dyspnea on exertion recently and shortness of breath at rest yesterday, no pleuritic  chest pain.  Hemodynamically stable and saturating well on room air. --Continue IV heparin --Echocardiogram --Venous Doppler ultrasound --Telemetry --Monitor hemodynamics and oxygenation closely --Transition to DOAC on discharge --Of note, patient has prior history of subdural hematoma from a fall.  Discussed in detail with patient, benefits of anticoagulation at this time outweigh risk of bleeding.  Discussed diligent fall precautions going forward.  Acute otitis media  Patient presented with severe right-sided ear pain and pressure in the setting of 3 to 4 days of upper respiratory viral symptoms.  EDPs otoscopic exam revealed bulging and erythematous tympanic membrane on the right.  Started on doxycycline . -- Continue doxycycline  -- Add Flonase and Claritin  --Monitor for clinical improvement  Hypertensive urgency BP is elevated in the ED initially 170/139, later 204/98.  No evidence of complications at this time. --IV hydralazine  as needed --Resume home metoprolol  (from home regimen when med history complete) -- Monitor BP closely and titrate regimen  History of traumatic subdural hematoma Secondary to fall which patient reports history of recurrent falls in the setting of neuropathy.   --Monitor closely on anticoagulation. -- PT evaluation  -- Fall precautions  Essential hypertension, benign See hypertensive urgency  Lumbar spondylosis Likely contributes to her neuropathy and falls  Presbycusis of both ears Noted  Uncomplicated opioid dependence (HCC) PDMP reviewed and appropriate.  Patient is prescribed Nucynta  100 mg, 30-day supply filled on 11/17. -- Oxycodone  substituted for home Nucynta  --IV Toradol  as needed, holding meloxicam   Benign paroxysmal positional vertigo No acute issues.  Appears not to be on medication for this  Idiopathic peripheral neuropathy Med history pending but does not appear to be on gabapentin or Lyrica for this.  Appears she takes meloxicam   and Nucynta . -- Fall precautions and PT eval  Mixed hyperlipidemia Resume home fenofibrate . Does not appear to be on statin.  Unintentional weight loss Patient notes 25 pound weight loss in a short amount of time last year.  States this was evaluated without cause being found, included colonoscopy.  She reports typically being up-to-date on mammograms but has not had one yet this year. --Recommend mammogram be scheduled, discussed with patient --Given acute PEs further evaluation for occult malignancy is warranted      Advance Care Planning: CODE STATUS: Full code Discussed CODE STATUS with patient during virtual encounter and she confirms she wants to be full code  Consults: None  Family Communication: None present during virtual encounter.  Patient updated in detail and agrees with the plan  Severity of Illness: The appropriate patient status for this patient is INPATIENT. Inpatient status is judged to be reasonable and necessary in order to provide the required intensity of service to ensure the patient's safety. The patient's presenting symptoms, physical exam findings, and initial radiographic and laboratory data in the context of their chronic comorbidities is felt to place them at high risk for further clinical deterioration. Furthermore, it is not anticipated that the patient will be medically stable for discharge from the hospital within 2 midnights of admission.   * I certify that at the point of admission it is my clinical judgment that the patient will require inpatient hospital care spanning beyond 2 midnights from the point of admission due to high intensity of service, high risk for further deterioration and high frequency of surveillance required.*  Author: Burnard DELENA Cunning, DO 02/26/2024 11:00 AM  For on call review [email or web address].

## 2024-02-26 NOTE — Assessment & Plan Note (Signed)
 Patient presented with severe right-sided ear pain and pressure in the setting of 3 to 4 days of upper respiratory viral symptoms.  EDPs otoscopic exam revealed bulging and erythematous tympanic membrane on the right.  Started on doxycycline . -- Continue doxycycline  -- Add Flonase and Claritin  --Monitor for clinical improvement

## 2024-02-26 NOTE — ED Provider Notes (Signed)
  [facility] Provider Note    .Critical Care  Performed by: Mannie Fairy DASEN, DO Authorized by: Mannie Fairy DASEN, DO   Critical care provider statement:    Critical care time (minutes):  35   Critical care was necessary to treat or prevent imminent or life-threatening deterioration of the following conditions:  Respiratory failure   Critical care was time spent personally by me on the following activities:  Development of treatment plan with patient or surrogate, discussions with consultants, evaluation of patient's response to treatment, examination of patient, ordering and review of laboratory studies, ordering and review of radiographic studies, ordering and performing treatments and interventions, pulse oximetry, re-evaluation of patient's condition and review of old charts Comments:     Bilateral pulmonary emboli with right heart strain, started on heparin        Mannie Fairy T, DO 02/26/24 0818

## 2024-02-27 ENCOUNTER — Other Ambulatory Visit (HOSPITAL_COMMUNITY): Payer: Self-pay

## 2024-02-27 ENCOUNTER — Telehealth (HOSPITAL_COMMUNITY): Payer: Self-pay

## 2024-02-27 ENCOUNTER — Inpatient Hospital Stay (HOSPITAL_COMMUNITY)

## 2024-02-27 ENCOUNTER — Encounter (HOSPITAL_COMMUNITY): Payer: Self-pay | Admitting: Internal Medicine

## 2024-02-27 ENCOUNTER — Observation Stay (HOSPITAL_COMMUNITY)

## 2024-02-27 DIAGNOSIS — R634 Abnormal weight loss: Secondary | ICD-10-CM

## 2024-02-27 DIAGNOSIS — I82432 Acute embolism and thrombosis of left popliteal vein: Secondary | ICD-10-CM | POA: Insufficient documentation

## 2024-02-27 DIAGNOSIS — R4182 Altered mental status, unspecified: Secondary | ICD-10-CM | POA: Diagnosis not present

## 2024-02-27 DIAGNOSIS — I2699 Other pulmonary embolism without acute cor pulmonale: Secondary | ICD-10-CM | POA: Diagnosis present

## 2024-02-27 DIAGNOSIS — F1120 Opioid dependence, uncomplicated: Secondary | ICD-10-CM

## 2024-02-27 DIAGNOSIS — I672 Cerebral atherosclerosis: Secondary | ICD-10-CM | POA: Diagnosis not present

## 2024-02-27 DIAGNOSIS — I82452 Acute embolism and thrombosis of left peroneal vein: Secondary | ICD-10-CM | POA: Diagnosis not present

## 2024-02-27 DIAGNOSIS — I82411 Acute embolism and thrombosis of right femoral vein: Secondary | ICD-10-CM | POA: Insufficient documentation

## 2024-02-27 LAB — BASIC METABOLIC PANEL WITH GFR
Anion gap: 11 (ref 5–15)
BUN: 23 mg/dL (ref 8–23)
CO2: 23 mmol/L (ref 22–32)
Calcium: 9.6 mg/dL (ref 8.9–10.3)
Chloride: 109 mmol/L (ref 98–111)
Creatinine, Ser: 0.8 mg/dL (ref 0.44–1.00)
GFR, Estimated: >60 mL/min (ref >60)
Glucose, Bld: 88 mg/dL (ref 70–99)
Potassium: 3.3 mmol/L — ABNORMAL LOW (ref 3.5–5.1)
Sodium: 143 mmol/L (ref 135–145)

## 2024-02-27 LAB — ECHOCARDIOGRAM COMPLETE
AR max vel: 2.61 cm2
AV Area VTI: 2.73 cm2
AV Area mean vel: 2.7 cm2
AV Mean grad: 6 mmHg
AV Peak grad: 11.7 mmHg
Ao pk vel: 1.71 m/s
Area-P 1/2: 3.48 cm2
Calc EF: 71.5 %
Est EF: >75
Height: 69 in
MV VTI: 2.39 cm2
S' Lateral: 2.5 cm
Single Plane A2C EF: 65.2 %
Single Plane A4C EF: 77.8 %
Weight: 2680.79 [oz_av]

## 2024-02-27 LAB — CBC
HCT: 39.7 % (ref 36.0–46.0)
Hemoglobin: 13.9 g/dL (ref 12.0–15.0)
MCH: 31.4 pg (ref 26.0–34.0)
MCHC: 35 g/dL (ref 30.0–36.0)
MCV: 89.8 fL (ref 80.0–100.0)
Platelets: 164 K/uL (ref 150–400)
RBC: 4.42 MIL/uL (ref 3.87–5.11)
RDW: 12.4 % (ref 11.5–15.5)
WBC: 6.5 K/uL (ref 4.0–10.5)
nRBC: 0 % (ref 0.0–0.2)

## 2024-02-27 LAB — HEPARIN LEVEL (UNFRACTIONATED): Heparin Unfractionated: 0.68 [IU]/mL (ref 0.30–0.70)

## 2024-02-27 MED ORDER — HEPARIN (PORCINE) 25000 UT/250ML-% IV SOLN
1300.0000 [IU]/h | INTRAVENOUS | Status: AC
  Administered 2024-02-27 – 2024-02-29 (×3): 1300 [IU]/h via INTRAVENOUS
  Filled 2024-02-27 (×2): qty 250

## 2024-02-27 MED ORDER — TAPENTADOL HCL 50 MG PO TABS
100.0000 mg | ORAL_TABLET | Freq: Four times a day (QID) | ORAL | Status: DC
  Administered 2024-02-27 – 2024-02-29 (×9): 100 mg via ORAL
  Filled 2024-02-27 (×9): qty 2

## 2024-02-27 MED ORDER — AMOXICILLIN-POT CLAVULANATE 875-125 MG PO TABS
1.0000 | ORAL_TABLET | Freq: Two times a day (BID) | ORAL | Status: DC
  Administered 2024-02-27 – 2024-02-29 (×5): 1 via ORAL
  Filled 2024-02-27 (×5): qty 1

## 2024-02-27 MED ORDER — MELATONIN 5 MG PO TABS
5.0000 mg | ORAL_TABLET | Freq: Once | ORAL | Status: AC
  Administered 2024-02-27: 5 mg via ORAL
  Filled 2024-02-27: qty 1

## 2024-02-27 MED ORDER — POTASSIUM CHLORIDE ER 10 MEQ PO TBCR
10.0000 meq | EXTENDED_RELEASE_TABLET | Freq: Two times a day (BID) | ORAL | Status: DC
  Administered 2024-02-28 – 2024-02-29 (×3): 10 meq via ORAL
  Filled 2024-02-27 (×6): qty 1

## 2024-02-27 MED ORDER — POTASSIUM CHLORIDE CRYS ER 20 MEQ PO TBCR
40.0000 meq | EXTENDED_RELEASE_TABLET | Freq: Once | ORAL | Status: AC
  Administered 2024-02-27: 40 meq via ORAL
  Filled 2024-02-27: qty 2

## 2024-02-27 NOTE — Evaluation (Signed)
 Physical Therapy Evaluation Patient Details Name: Brittany Mercer MRN: [id number] DOB: 21-Feb-1946 Today's Date: 02/27/2024  History of Present Illness  Pt is a 78y.o. female who presented on 02/26/24 with severe right ear pain, upper respiratory symptoms, cough, dyspnea on exertion, and weight loss of 25lbs over the last year. PMH includes: HTN, hyperlipidemia, suberal hematoma after fall 01/2024, lumbar spondylosis, and peripheral neuropathy. CT shower bilateral pulmonary emboli, and doppler showed bilateral LE DVT.  Clinical Impression  Pt seated at EOB with RN present providing medication upon PT arrival; pt agreeable to evaluation despite continued right ear pain and anxiety with recent changes in her health. Prior to admission, patient was living alone in independent living where they can provide meals and intermittent assistance if needed. She is close to her functional baseline ambulating 382ft with RW at [facility]. PT noticed new word finding and short term memory difficulties throughout session with patient reports is new and frustrating as she has never had issues with my words like this before. Concerns communicated to RN and MD. She will benefit from continued physical therapy while admitted to acute as well as follow up therapy with HHPT following discharge.        If plan is discharge home, recommend the following: Assistance with cooking/housework;A little help with walking and/or transfers   Can travel by private vehicle        Equipment Recommendations Rolling walker (2 wheels)  Recommendations for Other Services       Functional Status Assessment Patient has had a recent decline in their functional status and demonstrates the ability to make significant improvements in function in a reasonable and predictable amount of time.     Precautions / Restrictions Precautions Precautions: None Restrictions Weight Bearing Restrictions Per Provider Order: No      Mobility   Bed Mobility Overal bed mobility: Needs Assistance Bed Mobility: Supine to Sit, Sit to Supine     Supine to sit: Supervision Sit to supine: Supervision   General bed mobility comments: intermittent cues to wait for PT to have things set up and bed alarm turned off for her    Transfers Overall transfer level: Needs assistance Equipment used: Rolling walker (2 wheels) Transfers: Sit to/from Stand Sit to Stand: Contact guard assist                Ambulation/Gait Ambulation/Gait assistance: Contact guard assist Gait Distance (Feet): 300 Feet Assistive device: Rolling walker (2 wheels)   Gait velocity: decreased     General Gait Details: slow cadence, continued cues for awareness to her surroundings with pt hitting objects intermittently along path, difficulty with multitasking having to stop walking to converse with PT  Stairs            Wheelchair Mobility     Tilt Bed    Modified Rankin (Stroke Patients Only)       Balance Overall balance assessment: Needs assistance Sitting-balance support: Feet supported Sitting balance-Leahy Scale: Good     Standing balance support: Bilateral upper extremity supported, During functional activity Standing balance-Leahy Scale: Fair                               Pertinent Vitals/Pain Pain Assessment Pain Assessment: 0-10 Pain Score: 6  Pain Location: R ear Pain Descriptors / Indicators: Aching, Sharp, Grimacing, Guarding Pain Intervention(s): RN gave pain meds during session, Monitored during session    Home Living Family/patient expects to be  discharged to:: Private residence (ILF) Living Arrangements: Alone Available Help at Discharge: Friend(s);Other (Comment);Available PRN/intermittently (staff available at ILF if needed) Type of Home: House Home Access: Level entry       Home Layout: One level Home Equipment: Grab bars - toilet;Grab bars - tub/shower;Cane - single point Additional  Comments: recently started using cane    Prior Function Prior Level of Function : Independent/Modified Independent             Mobility Comments: ambulates with cane ADLs Comments: IND with all ADLs and IADLs     Extremity/Trunk Assessment   Upper Extremity Assessment Upper Extremity Assessment: Generalized weakness    Lower Extremity Assessment Lower Extremity Assessment: Generalized weakness       Communication   Communication Communication: No apparent difficulties Factors Affecting Communication: Hearing impaired (hearing difficulties could be secondary to right ear pain)    Cognition Arousal: Alert Behavior During Therapy: WFL for tasks assessed/performed   PT - Cognitive impairments: Orientation   Orientation impairments: Time, Place                   PT - Cognition Comments: difficulty recalling name of hospital despite reporting she has been told the name several times; unable to state month or state; pt reports memory issues are all new since onset of recent symptoms that brought her to the hospital Following commands: Intact       Cueing Cueing Techniques: Verbal cues, Gestural cues     General Comments      Exercises     Assessment/Plan    PT Assessment Patient needs continued PT services  PT Problem List Decreased strength;Decreased balance;Decreased activity tolerance;Decreased cognition       PT Treatment Interventions Gait training;Functional mobility training;Therapeutic exercise;Balance training    PT Goals (Current goals can be found in the Care Plan section)  Acute Rehab PT Goals Patient Stated Goal: feel normal again when I move around PT Goal Formulation: With patient Time For Goal Achievement: 03/12/24 Potential to Achieve Goals: Good    Frequency Min 2X/week     Co-evaluation               AM-PAC PT 6 Clicks Mobility  Outcome Measure Help needed turning from your back to your side while in a flat bed  without using bedrails?: A Little Help needed moving from lying on your back to sitting on the side of a flat bed without using bedrails?: A Little Help needed moving to and from a bed to a chair (including a wheelchair)?: A Little Help needed standing up from a chair using your arms (e.g., wheelchair or bedside chair)?: A Little Help needed to walk in hospital room?: A Little Help needed climbing 3-5 steps with a railing? : A Little 6 Click Score: 18    End of Session Equipment Utilized During Treatment: Gait belt Activity Tolerance: Patient tolerated treatment well;Patient limited by pain (ear pain distracting during session) Patient left: in chair;with call bell/phone within reach Nurse Communication: Mobility status;Other (comment) (new wording finding and short term memory difficulties) PT Visit Diagnosis: Unsteadiness on feet (R26.81)    Time: 8866-8787 PT Time Calculation (min) (ACUTE ONLY): 39 min   Charges:   PT Evaluation $PT Eval Moderate Complexity: 1 Mod PT Treatments $Gait Training: 8-22 mins $Therapeutic Activity: 8-22 mins           Isaiah DEL. Eduardo Honor, PT, DPT   [employer] 02/27/2024, 1:13 PM

## 2024-02-27 NOTE — Hospital Course (Addendum)
 78 year old woman PMH including subdural hematoma status post fall, presented to ED with severe right ear pain, upper respiratory symptoms, cough, dyspnea on exertion and shortness of breath, weight loss of 25 pounds over the last year which was evaluated with colonoscopy.  CT revealed bilateral pulmonary emboli with concern for right heart strain, a.m. notable for right otitis media.  Consultants   Procedures/Events 12/2 admission for bilateral PE

## 2024-02-27 NOTE — Progress Notes (Signed)
  Echocardiogram 2D Echocardiogram has been performed.  Devora Ellouise SAUNDERS 02/27/2024, 9:10 AM

## 2024-02-27 NOTE — Progress Notes (Signed)
     Patient Name: Brittany Mercer           DOB: May 13, 1945  MRN: [id number]      Admission Date: 02/26/2024  Attending Provider: Jadine Toribio SQUIBB, MD  Primary Diagnosis: Acute pulmonary embolism (HCC)   Level of care: Telemetry   OVERNIGHT EVENT   Follow-up on head CT Impression --  No acute hemorrhage. No evidence of acute infarct. No hydrocephalus. No extra-axial collection. No mass effect or midline shift. Periventricular white matter changes, likely sequela of chronic small vessel ischemic disease. Atherosclerotic calcifications in intracranial carotid and vertebral arteries.   No acute neuro change reported.  Patient remains disoriented.   Plan: Restart heparin gtt. Routine MRI of the brain    Lavanda Horns, DNP, ACNPC- AG [facility] [place]

## 2024-02-27 NOTE — Progress Notes (Signed)
 PHARMACY - ANTICOAGULATION CONSULT NOTE  Pharmacy Consult for heparin Indication: pulmonary embolus/DVT  Allergies  Allergen Reactions   Keflex  [Cephalexin ] Other (See Comments)    GI, couldn't eat/drink, felt very sick   Statins Other (See Comments)    Myalgia, turns red   Zetia  [Ezetimibe ] Other (See Comments)    Myopathy/redness   Penicillins Rash and Other (See Comments)    Pt not sure if she is allergic to penicillin or sulfa. So she does not take either one.  DID THE REACTION INVOLVE: Swelling of the face/tongue/throat, SOB, or low BP? Unknown Sudden or severe rash/hives, skin peeling, or the inside of the mouth or nose? No Did it require medical treatment? No When did it last happen?      childhood allergy If all above answers are "NO", may proceed with cephalosporin use.   Sulfa Antibiotics Rash and Other (See Comments)    Pt not sure if she is allergic to sulfa or penicillin so she doesn't take either one.     Patient Measurements: Height: 5' 9 (175.3 cm) Weight: 76 kg (167 lb 8.8 oz) IBW/kg (Calculated) : 66.2 HEPARIN DW (KG): 76  Vital Signs: Temp: 98.4 F (36.9 C) (12/03 1416) Temp Source: Oral (12/03 1416) BP: 177/104 (12/03 1416) Pulse Rate: 93 (12/03 1416)  Labs: Recent Labs    02/26/24 0613 02/26/24 1806 02/27/24 0137  HGB 14.0  --  13.9  HCT 40.1  --  39.7  PLT 169  --  164  HEPARINUNFRC  --  0.68 0.68  CREATININE 0.87  --  0.80    Estimated Creatinine Clearance: 61.5 mL/min (by C-G formula based on SCr of 0.8 mg/dL).   Medical History: Past Medical History:  Diagnosis Date   Acute deep vein thrombosis (DVT) of left peroneal vein (HCC) 02/27/2024   Acute deep vein thrombosis (DVT) of popliteal vein of left lower extremity (HCC) 02/27/2024   Acute deep vein thrombosis (DVT) of right femoral vein (HCC) 02/27/2024   Acute pulmonary embolism (HCC) 02/26/2024   Allergy    Cataract    Chronic narcotic use    takes Nucynta  for neuropathic pain    Headache    Hereditary and idiopathic peripheral neuropathy    followed by pcp   Hyperlipidemia    Hypertension    Hypokalemia    OA (osteoarthritis)    LEFT KNEE   Peripheral neuropathy     Medications: No anticoagulants PTA  Assessment: Pt is a 69 yoF presenting with otalgia. CTA revealed multiple bilateral PE, suggestive of RHS. Venous doppler + bilateral acute DVT. 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   Pharmacy consulted to dose heparin.   Today, 02/27/24 Confirmatory Heparin level = 0.68 is therapeutic on heparin infusion of 1300 units/hr CBC wnl No bleeding or complications reported  IV heparin stopped at 1454 and head CT ordered CT Head: no acute intracranial abnormality  Goal of Therapy:  Heparin level 0.3-0.7 units/ml Monitor platelets by anticoagulation protocol: Yes   Plan:  Resume IV heparin infusion at 1300 units/hr per A. Chavez CBC, heparin level daily Monitor for signs of bleeding   Thank you for allowing pharmacy to be a part of this patient's care.  Eleanor EMERSON Agent, PharmD, BCPS Clinical Pharmacist [place] 02/27/2024 8:31 PM

## 2024-02-27 NOTE — Progress Notes (Signed)
 Progress Note   Patient: Brittany Mercer FMW:[id number] DOB: 1945/10/13 DOA: 02/26/2024     1 DOS: the patient was seen and examined on 02/27/2024   Brief hospital course: 78 year old woman PMH including subdural hematoma status post fall, presented to ED with severe right ear pain, upper respiratory symptoms, cough, dyspnea on exertion and shortness of breath, weight loss of 25 pounds over the last year which was evaluated with colonoscopy.  CT revealed bilateral pulmonary emboli with concern for right heart strain, a.m. notable for right otitis media.  Consultants   Procedures/Events 12/2 admission for bilateral PE  Assessment and Plan: * Acute pulmonary embolism (HCC) Acute DVT right common femoral vein Acute DVT left popliteal vein, left peroneal vein Elevated troponin Presented with shortness of breath.  Hemodynamic stable.  No hypoxia. CT showed multiple bilateral pulmonary emboli involving the left upper lobar, left upper segmental, lingular, left lower lobar, right lower lobar, proximal right upper lobar, and right middle lobe segmental pulmonary arteries.  Concern for right heart strain. Bilateral lower extremity venous Doppler showed bilateral DVT. 2d echocardiogram showed normal RV systolic function. Inciting event unclear.  Also unintentional weight loss noted.  Reviewing the record she was seen by gastroenterology August 2024, and at that time had an EGD and colonoscopy which were unrevealing.  CT of the abdomen and pelvis as well as MRI of the pancreas were unrevealing.  No follow-up imaging was recommended.  The studies were conducted specifically for related to thrive and unexplained weight loss.  Hypertensive urgency Benign essential hypertension Elevated systolic blood pressure 170-180s Monitor.  Asymptomatic.  Continue metoprolol .  May need second agent.  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  No previous studies on file.   Asymptomatic.  Will refer to cardiology as an outpatient.  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   Therapy evaluation Monitor closely on anticoagulation.  Uncomplicated opioid dependence (HCC) PDMP reviewed and appropriate per admitting physician.  Patient is prescribed Nucynta  100 mg, 30-day supply filled on 11/17. Continue Nucynta   Benign paroxysmal positional vertigo No acute issues.  Appears not to be on medication for this  Idiopathic peripheral neuropathy Not on gabapentin or Lyrica.  Appears she takes meloxicam  and Nucynta . Fall precautions.  Therapy evaluation.  Mixed hyperlipidemia Continue home fenofibrate .  Unintentional weight loss Patient notes 25 pound weight loss in a short amount of time last year.  States this was evaluated without cause being found, included colonoscopy.  She reports typically being up-to-date on mammograms but has not had one yet this year. Recommend mammogram be scheduled, discussed with patient Given acute PEs further evaluation for occult malignancy is warranted in the outpatient setting  Hypokalemia Replete  Puzzling case with unexplained weight loss as well as substantial PE and DVT.  CT chest unrevealing.  Needs outpatient mammography.  Will check CT abdomen pelvis here for completeness, if unrevealing, will start apixaban , will need close outpatient follow-up and potentially further investigation.     Subjective:  Feels tremulous and anxious Breathing ok Still has right ear pain and sore throat  Physical Exam: Vitals:   02/26/24 2058 02/27/24 0058 02/27/24 0500 02/27/24 0504  BP: (!) 182/88 (!) 166/84 (!) 176/80 (!) 175/88  Pulse: 78 78 79 78  Resp: 20 18  16   Temp:  98.8 F (37.1 C)  99.3 F (37.4 C)  TempSrc:  Oral  Oral  SpO2: 97% 98% 98% 98%  Weight:      Height:       Physical Exam Vitals reviewed.   Constitutional:      General: She is not in acute distress.    Appearance: She is not ill-appearing or toxic-appearing.  HENT:     Mouth/Throat:     Pharynx: No oropharyngeal exudate.  Cardiovascular:     Rate and Rhythm: Normal rate and regular rhythm.     Heart sounds: No murmur heard. Pulmonary:     Effort: Pulmonary effort is normal. No respiratory distress.     Breath sounds: No wheezing, rhonchi or rales.  Musculoskeletal:     Right lower leg: No edema.     Left lower leg: No edema.  Neurological:     Mental Status: She is alert.  Psychiatric:        Mood and Affect: Mood normal.        Behavior: Behavior normal.     Data Reviewed: K+ 3.3 CBC WNL Echo pending Bilateral LE venous dopplers noted  Family Communication: none present  Disposition: Status is: Inpatient Remains inpatient appropriate because: bilateral PE     Time spent: 35 minutes  Author: Toribio Door, MD 02/27/2024 10:08 AM  For on call review [email or web address].

## 2024-02-27 NOTE — TOC Initial Note (Signed)
 Transition of Care [facility]) - Initial/Assessment Note   Patient Details  Name: Brittany Mercer MRN: [id number] Date of Birth: 03-07-1946  Transition of [facility] CM/SW Contact:    Duwaine GORMAN Aran, LCSW Phone Number: 02/27/2024, 3:39 PM  Clinical Narrative: PT evaluation recommended HHPT and a rolling walker, but patient was confused. CSW spoke with sister-in-law, Merlynn Antone, to complete an assessment. SIL confirmed patient resides in independent living at [facility] and is not confused at baseline like she is presenting in the hospital. SIL agreeable to patient resuming PT at discharge. CSW spoke with Stevphen at [facility] regarding PT. Per Stevphen, patient was likely getting PT through [facility] [phone]). CSW was unable to reach [facility] to confirm. Care management to follow.  Expected Discharge Plan: Home w Home Health Services ([facility]) Barriers to Discharge: Continued Medical Work up  Patient Goals and CMS Choice Patient states their goals for this hospitalization and ongoing recovery are:: Return to [facility]  Expected Discharge Plan and Services In-house Referral: Clinical Social Work Post Acute Care Choice: Home Health Living arrangements for the past 2 months: Independent Living Facility  Prior Living Arrangements/Services Living arrangements for the past 2 months: Independent Living Facility Lives with:: Facility Resident Patient language and need for interpreter reviewed:: Yes Do you feel safe going back to the place where you live?: Yes      Need for Family Participation in Patient Care: No (Comment) Care giver support system in place?: Yes (comment) Criminal Activity/Legal Involvement Pertinent to Current Situation/Hospitalization: No - Comment as needed  Activities of Daily Living ADL Screening (condition at time of admission) Independently performs ADLs?: Yes (appropriate for developmental age) Is the patient deaf or have difficulty hearing?: No Does the  patient have difficulty seeing, even when wearing glasses/contacts?: No Does the patient have difficulty concentrating, remembering, or making decisions?: No  Permission Sought/Granted Permission sought to share information with : Facility Industrial/product Designer granted to share information with : Yes, Verbal Permission Granted Permission granted to share info w AGENCY: [facility]  Emotional Assessment Attitude/Demeanor/Rapport: Inconsistent Orientation: : Fluctuating Orientation (Suspected and/or reported Sundowners) Alcohol / Substance Use: Not Applicable Psych Involvement: No (comment)  Admission diagnosis:  Acute pulmonary embolism (HCC) [I26.99] Other acute pulmonary embolism without acute cor pulmonale (HCC) [I26.99] Acute otitis media, unspecified otitis media type [H66.90] 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   Hypertensive urgency 02/26/2024   Acute otitis media 02/26/2024   Unintentional weight loss 02/26/2024   SDH (subdural hematoma) (HCC) 02/23/2023   Subdural hematoma (HCC) 02/22/2023   Lumbar spondylosis 01/03/2023   Low back pain 12/18/2022   Encounter for counseling 06/12/2022   Laceration of face with complication 03/28/2022   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 [address] [place] [address] Phone: [phone] Fax: [phone]  Social Drivers of Health (SDOH) Social History: SDOH Screenings   Food Insecurity: No Food Insecurity (02/26/2024)  Housing: Low Risk  (02/26/2024)  Transportation Needs: No Transportation Needs (02/26/2024)  Utilities: Not At Risk (02/26/2024)  Alcohol Screen: Low Risk  (10/08/2023)  Depression (PHQ2-9): Low Risk  (01/09/2024)  Financial Resource Strain: Low Risk  (10/08/2023)  Physical Activity: Inactive (10/08/2023)  Social Connections: Moderately Integrated (02/26/2024)  Stress: No Stress Concern Present (10/08/2023)  Tobacco Use: Medium Risk (02/26/2024)  Health Literacy: Adequate Health Literacy (07/26/2023)   SDOH Interventions:    Readmission Risk Interventions    02/23/2023   11:49 AM  Readmission Risk Prevention Plan  Post Dischage Appt Complete  Medication Screening Complete  Transportation Screening Complete

## 2024-02-27 NOTE — Progress Notes (Signed)
 PHARMACY - ANTICOAGULATION CONSULT NOTE  Pharmacy Consult for heparin Indication: pulmonary embolus/DVT  Allergies  Allergen Reactions   Keflex  [Cephalexin ] Other (See Comments)    GI, couldn't eat/drink, felt very sick   Statins Other (See Comments)    Myalgia, turns red   Zetia  [Ezetimibe ] Other (See Comments)    Myopathy/redness   Penicillins Rash and Other (See Comments)    Pt not sure if she is allergic to penicillin or sulfa. So she does not take either one.  DID THE REACTION INVOLVE: Swelling of the face/tongue/throat, SOB, or low BP? Unknown Sudden or severe rash/hives, skin peeling, or the inside of the mouth or nose? No Did it require medical treatment? No When did it last happen?      childhood allergy If all above answers are "NO", may proceed with cephalosporin use.   Sulfa Antibiotics Rash and Other (See Comments)    Pt not sure if she is allergic to sulfa or penicillin so she doesn't take either one.     Patient Measurements: Height: 5' 9 (175.3 cm) Weight: 76 kg (167 lb 8.8 oz) IBW/kg (Calculated) : 66.2 HEPARIN DW (KG): 76  Vital Signs: Temp: 98.8 F (37.1 C) (12/03 0058) Temp Source: Oral (12/03 0058) BP: 166/84 (12/03 0058) Pulse Rate: 78 (12/03 0058)  Labs: Recent Labs    02/26/24 0613 02/26/24 1806 02/27/24 0137  HGB 14.0  --  13.9  HCT 40.1  --  39.7  PLT 169  --  164  HEPARINUNFRC  --  0.68 0.68  CREATININE 0.87  --  0.80    Estimated Creatinine Clearance: 61.5 mL/min (by C-G formula based on SCr of 0.8 mg/dL).   Medical History: Past Medical History:  Diagnosis Date   Allergy    Cataract    Chronic narcotic use    takes Nucynta  for neuropathic pain   Headache    Hereditary and idiopathic peripheral neuropathy    followed by pcp   Hyperlipidemia    Hypertension    Hypokalemia    OA (osteoarthritis)    LEFT KNEE   Peripheral neuropathy     Medications: No anticoagulants PTA  Assessment: Pt is a 34 yoF presenting with  otalgia. CTA revealed multiple bilateral PE, suggestive of RHS. Venous doppler + bilateral acute DVT. 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   Pharmacy consulted to dose heparin.   Today, 02/27/24 Confirmatory Heparin level = 0.68 is therapeutic on heparin infusion of 1300 units/hr CBC wnl No bleeding or complications reported  Goal of Therapy:  Heparin level 0.3-0.7 units/ml Monitor platelets by anticoagulation protocol: Yes   Plan:  Continue heparin infusion at 1300 units/hr  CBC, heparin level daily Monitor for signs of bleeding  Arvin Gauss, PharmD 02/27/2024,3:36 AM

## 2024-02-27 NOTE — Plan of Care (Signed)

## 2024-02-27 NOTE — Progress Notes (Signed)
 Addendum  Notified by physical therapy with the patient seem to be confused and have some word finding difficulties.  Reevaluated patient in the afternoon, patient very anxious, responses variable and inconsistent.  Seemed to have difficulty answering some questions and not others, nevertheless does appear to be confused.   Physical Exam Vitals reviewed.  Constitutional:      General: She is not in acute distress.    Appearance: She is not ill-appearing or toxic-appearing.  Cardiovascular:     Rate and Rhythm: Normal rate and regular rhythm.     Heart sounds: No murmur heard. Pulmonary:     Effort: Pulmonary effort is normal. No respiratory distress.     Breath sounds: No wheezing, rhonchi or rales.  Neurological:     Mental Status: She is alert. She is disoriented.     Cranial Nerves: No cranial nerve deficit.     Motor: No weakness.     Coordination: Coordination normal.  Psychiatric:        Mood and Affect: Mood normal.        Behavior: Behavior normal.   Alert to self but not able to consistently answer orientation questions otherwise.  Unable to articulate why she is in the hospital.  Rather than word finding difficulties, appears to be at a loss to answer even basic orientation questions.  Long discussion with emergency contact sister-in-law who reports the patient's confusion preceded her presentation to [facility] yesterday and has been demonstrated since that time.  Such confusion is very atypical for her, she lives alone at [place].  Her significant other has advanced dementia and was just placed in [place] before Thanksgiving.  Patient was going to have a visit from her son and family but she canceled this secondary to feeling poorly prior to coming to the hospital.  Did have a fall prior to coming to the hospital.  Her exam is nonfocal other than her difficulty answering simple questions.  She does however have good attention and was able to recall very very recent events  within the last few minutes.  Her symptoms seem to wax and wane a bit and have been present since at least 12/2 prior to presentation at [facility].  Last known normal therefore is unknown.  No focal deficits on exam.  Given the chronicity of symptoms not a candidate for urgent intervention.  Start with CT head which was ordered stat and is still pending.  I have inquired into this.  If this is unrevealing we will proceed with MRI of the brain.  There is definitely component of anxiety and it is possible if other studies and workup is unrevealing that this is secondary to anxiety but that will be a diagnosis of exclusion.  I have temporarily stopped heparin pending CT results.  If it is negative for bleed however will restart heparin.  I will communicate with the night team covering for close follow-up overnight and tight handoff.  Toribio Door, MD [facility]

## 2024-02-27 NOTE — Telephone Encounter (Signed)
 Pharmacy Patient Advocate Encounter  Insurance verification completed.    The patient is insured through [employer]. Patient has Medicare and is not eligible for a copay card, but may be able to apply for patient assistance or Medicare RX Payment Plan (Patient Must reach out to their plan, if eligible for payment plan), if available.    Ran test claim for Xarelto Starter pack and the current 30 day co-pay is $0.  Ran test claim for Eliquis Starter pack and the current 30 day co-pay is $0.  This test claim was processed through [employer]- copay amounts may vary at other pharmacies due to [employer], or as the patient moves through the different stages of their insurance plan.

## 2024-02-27 NOTE — Plan of Care (Signed)
  Problem: Education: Goal: Knowledge of General Education information will improve Description: Including pain rating scale, medication(s)/side effects and non-pharmacologic comfort measures Outcome: Progressing   Problem: Clinical Measurements: Goal: Diagnostic test results will improve Outcome: Progressing   Problem: Activity: Goal: Risk for activity intolerance will decrease Outcome: Progressing   Problem: Nutrition: Goal: Adequate nutrition will be maintained Outcome: Progressing   Problem: Pain Managment: Goal: General experience of comfort will improve and/or be controlled Outcome: Progressing   Problem: Safety: Goal: Ability to remain free from injury will improve Outcome: Progressing

## 2024-02-28 ENCOUNTER — Inpatient Hospital Stay (HOSPITAL_COMMUNITY)

## 2024-02-28 DIAGNOSIS — I82452 Acute embolism and thrombosis of left peroneal vein: Secondary | ICD-10-CM

## 2024-02-28 DIAGNOSIS — I82411 Acute embolism and thrombosis of right femoral vein: Secondary | ICD-10-CM | POA: Diagnosis not present

## 2024-02-28 DIAGNOSIS — Z87828 Personal history of other (healed) physical injury and trauma: Secondary | ICD-10-CM

## 2024-02-28 DIAGNOSIS — I2699 Other pulmonary embolism without acute cor pulmonale: Secondary | ICD-10-CM | POA: Diagnosis not present

## 2024-02-28 DIAGNOSIS — I82432 Acute embolism and thrombosis of left popliteal vein: Secondary | ICD-10-CM | POA: Diagnosis not present

## 2024-02-28 LAB — CBC
HCT: 37.9 % (ref 36.0–46.0)
Hemoglobin: 13.1 g/dL (ref 12.0–15.0)
MCH: 31 pg (ref 26.0–34.0)
MCHC: 34.6 g/dL (ref 30.0–36.0)
MCV: 89.8 fL (ref 80.0–100.0)
Platelets: 169 K/uL (ref 150–400)
RBC: 4.22 MIL/uL (ref 3.87–5.11)
RDW: 12.5 % (ref 11.5–15.5)
WBC: 6.4 K/uL (ref 4.0–10.5)
nRBC: 0 % (ref 0.0–0.2)

## 2024-02-28 LAB — HEPARIN LEVEL (UNFRACTIONATED)
Heparin Unfractionated: 0.39 [IU]/mL (ref 0.30–0.70)
Heparin Unfractionated: 0.41 [IU]/mL (ref 0.30–0.70)

## 2024-02-28 MED ORDER — IOHEXOL 9 MG/ML PO SOLN
500.0000 mL | ORAL | Status: AC
  Administered 2024-02-28: 500 mL via ORAL

## 2024-02-28 MED ORDER — IOHEXOL 300 MG/ML  SOLN
100.0000 mL | Freq: Once | INTRAMUSCULAR | Status: AC | PRN
  Administered 2024-02-28: 100 mL via INTRAVENOUS

## 2024-02-28 MED ORDER — LORAZEPAM 0.5 MG PO TABS
0.5000 mg | ORAL_TABLET | Freq: Four times a day (QID) | ORAL | Status: DC | PRN
  Administered 2024-02-28: 0.5 mg via ORAL
  Filled 2024-02-28: qty 1

## 2024-02-28 NOTE — Progress Notes (Signed)
 Progress Note   Patient: Brittany Mercer FMW:[id number] DOB: Jul 29, 1945 DOA: 02/26/2024     2 DOS: the patient was seen and examined on 02/28/2024   Brief hospital course: 78 year old woman PMH including subdural hematoma status post fall, presented to ED with severe right ear pain, upper respiratory symptoms, cough, dyspnea on exertion and shortness of breath, weight loss of 25 pounds over the last year which was evaluated with colonoscopy.  CT revealed bilateral pulmonary emboli with concern for right heart strain, a.m. notable for right otitis media.  Consultants   Procedures/Events 12/2 admission for bilateral PE   Assessment and Plan: * Acute pulmonary embolism (HCC) Acute DVT right common femoral vein Acute DVT left popliteal vein, left peroneal vein Elevated troponin Presented with shortness of breath.  Hemodynamic stable.  No hypoxia. CT showed multiple bilateral pulmonary emboli involving the left upper lobar, left upper segmental, lingular, left lower lobar, right lower lobar, proximal right upper lobar, and right middle lobe segmental pulmonary arteries.   Bilateral lower extremity venous Doppler showed bilateral DVT. 2d echocardiogram showed normal RV systolic function. Inciting event unclear.  Also unintentional weight loss noted.  Reviewing the record she was seen by gastroenterology August 2024, and at that time had an EGD and colonoscopy which were unrevealing.  CT of the abdomen and pelvis as well as MRI of the pancreas were unrevealing.  No follow-up imaging was recommended.  The studies were conducted specifically for related to thrive and unexplained weight loss. Will check a CT abdomen pelvis to rule out obvious abnormalities.  Confusion Memory loss Anxiety Noted by staff yesterday to be confused and have some memory loss.  Multiple examinations were suggestive of severe anxiety and overwhelm.  Send no focal neurologic deficits.  CT head was negative.   Clinically the patient is improved today although still mildly confused.  Most likely this is related to anxiety and overwhelm.  Will check an MRI for completeness.    Hypertensive urgency Benign essential hypertension Elevated systolic blood pressure 170-180s Monitor.  Asymptomatic.  Continue metoprolol .  May need second agent.   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  No previous studies on file.  Asymptomatic.  Will refer to cardiology as an outpatient.   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   Therapy evaluation Monitor closely on anticoagulation.   Uncomplicated opioid dependence (HCC) PDMP reviewed and appropriate per admitting physician.  Patient is prescribed Nucynta  100 mg, 30-day supply filled on 11/17. Continue Nucynta    Benign paroxysmal positional vertigo No acute issues.  Appears not to be on medication for this   Idiopathic peripheral neuropathy Not on gabapentin or Lyrica.  Appears she takes meloxicam  and Nucynta . Fall precautions.  Therapy evaluation--Home health PT   Mixed hyperlipidemia Continue home fenofibrate .   Unintentional weight loss Patient notes 25 pound weight loss in a short amount of time last year.  States this was evaluated without cause being found, included colonoscopy.  She reports typically being up-to-date on mammograms but has not had one yet this year. Recommend mammogram be scheduled, discussed with patient Given acute PEs further evaluation for occult malignancy is warranted in the outpatient setting.  CT abdomen pelvis here.   Hypokalemia Replete  Improving, if continues to improve, likely 12/5.      Subjective:  Feels a bit better today, still some confusion and memory loss but took a walk with nurses.  RN  was concerned about patient's anxiety and reported the patient was much calmer after  walk  Physical Exam: Vitals:   02/27/24 1406 02/27/24 1416 02/27/24 2044 02/28/24 0608  BP:  (!) 177/104 (!) 197/101 (!) 157/80  Pulse:  93 77 77  Resp: (!) 22 16 20 20   Temp:  98.4 F (36.9 C) 99 F (37.2 C) 98.6 F (37 C)  TempSrc:  Oral Oral Oral  SpO2: 97% 94% 95% 93%  Weight:      Height:       Physical Exam Vitals reviewed.  Constitutional:      General: She is not in acute distress.    Appearance: She is not ill-appearing or toxic-appearing.  Cardiovascular:     Rate and Rhythm: Normal rate and regular rhythm.     Heart sounds: No murmur heard. Pulmonary:     Effort: Pulmonary effort is normal. No respiratory distress.     Breath sounds: No wheezing, rhonchi or rales.  Neurological:     Mental Status: She is alert.  Psychiatric:        Mood and Affect: Mood normal.        Behavior: Behavior normal.     Data Reviewed: CBC within normal limits CT head negative  Family Communication: sister-in-law Merlynn by telephone  Disposition: Status is: Inpatient Remains inpatient appropriate because: PE     Time spent: 35 minutes  Author: Toribio Door, MD 02/28/2024 7:54 AM  For on call review [email or web address].

## 2024-02-28 NOTE — Progress Notes (Signed)
 PHARMACY - ANTICOAGULATION CONSULT NOTE  Pharmacy Consult for heparin Indication: pulmonary embolus, DVT  Allergies  Allergen Reactions   Keflex  [Cephalexin ] Other (See Comments)    GI, couldn't eat/drink, felt very sick   Statins Other (See Comments)    Myalgia, turns red   Zetia  [Ezetimibe ] Other (See Comments)    Myopathy/redness   Sulfa Antibiotics Rash and Other (See Comments)    Pt not sure if she is allergic to sulfa or penicillin so she doesn't take either one.     Patient Measurements: Height: 5' 9 (175.3 cm) Weight: 76 kg (167 lb 8.8 oz) IBW/kg (Calculated) : 66.2 HEPARIN DW (KG): 76  Vital Signs: Temp: 98.6 F (37 C) (12/04 0608) Temp Source: Oral (12/04 0608) BP: 137/66 (12/04 0927) Pulse Rate: 81 (12/04 0927)  Labs: Recent Labs    02/26/24 0613 02/26/24 1806 02/27/24 0137 02/28/24 0649 02/28/24 1503  HGB 14.0  --  13.9 13.1  --   HCT 40.1  --  39.7 37.9  --   PLT 169  --  164 169  --   HEPARINUNFRC  --    < > 0.68 0.39 0.41  CREATININE 0.87  --  0.80  --   --    < > = values in this interval not displayed.    Estimated Creatinine Clearance: 61.5 mL/min (by C-G formula based on SCr of 0.8 mg/dL).   Medical History: Past Medical History:  Diagnosis Date   Acute deep vein thrombosis (DVT) of left peroneal vein (HCC) 02/27/2024   Acute deep vein thrombosis (DVT) of popliteal vein of left lower extremity (HCC) 02/27/2024   Acute deep vein thrombosis (DVT) of right femoral vein (HCC) 02/27/2024   Acute pulmonary embolism (HCC) 02/26/2024   Allergy    Cataract    Chronic narcotic use    takes Nucynta  for neuropathic pain   Headache    Hereditary and idiopathic peripheral neuropathy    followed by pcp   Hyperlipidemia    Hypertension    Hypokalemia    OA (osteoarthritis)    LEFT KNEE   Peripheral neuropathy     Medications: No anticoagulants PTA  Assessment: Pt is a 61 yoF presenting with otalgia. CTA revealed multiple bilateral PE,  suggestive of RHS. Venous doppler + bilateral acute DVT. 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   Pharmacy consulted to dose heparin.   Significant Events: -12/3 PM: Heparin paused due to AMS, word finding difficulties. CT Head no acute intracranial abnormality. Heparin resumed. MRI brain ordered.  PM, 02/28/24 Heparin level remains therapeutic on heparin infusion of 1300 units/hr CBC WNL, stable No bleeding or complications reported  Goal of Therapy:  Heparin level 0.3-0.7 units/ml Monitor platelets by anticoagulation protocol: Yes   Plan:  Continue heparin infusion at 1300 units/hr  CBC, heparin level daily Monitor for signs of bleeding   Eva CHRISTELLA Allis, PharmD, BCPS Secure Chat if ?s 02/28/2024 4:07 PM

## 2024-02-28 NOTE — Progress Notes (Signed)
 Mobility Specialist - Progress Note   02/28/24 1432  Mobility  Activity Ambulated with assistance  Level of Assistance Contact guard assist, steadying assist  Assistive Device Front wheel walker  Distance Ambulated (ft) 350 ft  Range of Motion/Exercises Active  Activity Response Tolerated well  Mobility visit 1 Mobility  Mobility Specialist Start Time (ACUTE ONLY) 1420  Mobility Specialist Stop Time (ACUTE ONLY) 1432  Mobility Specialist Time Calculation (min) (ACUTE ONLY) 12 min   Pt was found sitting EOB and agreeable to mobilize. Requires safety cues during hallway ambulation. At EOS returned to bed with all needs met. Call bell in reach and son in room.   Erminio Leos,  Mobility Specialist Can be reached via Secure Chat

## 2024-02-28 NOTE — Progress Notes (Signed)
 PHARMACY - ANTICOAGULATION CONSULT NOTE  Pharmacy Consult for heparin Indication: pulmonary embolus, DVT  Allergies  Allergen Reactions   Keflex  [Cephalexin ] Other (See Comments)    GI, couldn't eat/drink, felt very sick   Statins Other (See Comments)    Myalgia, turns red   Zetia  [Ezetimibe ] Other (See Comments)    Myopathy/redness   Penicillins Rash and Other (See Comments)    Pt not sure if she is allergic to penicillin or sulfa. So she does not take either one.  DID THE REACTION INVOLVE: Swelling of the face/tongue/throat, SOB, or low BP? Unknown Sudden or severe rash/hives, skin peeling, or the inside of the mouth or nose? No Did it require medical treatment? No When did it last happen?      childhood allergy If all above answers are "NO", may proceed with cephalosporin use.   Sulfa Antibiotics Rash and Other (See Comments)    Pt not sure if she is allergic to sulfa or penicillin so she doesn't take either one.     Patient Measurements: Height: 5' 9 (175.3 cm) Weight: 76 kg (167 lb 8.8 oz) IBW/kg (Calculated) : 66.2 HEPARIN DW (KG): 76  Vital Signs: Temp: 98.6 F (37 C) (12/04 0608) Temp Source: Oral (12/04 0608) BP: 137/66 (12/04 0927) Pulse Rate: 81 (12/04 0927)  Labs: Recent Labs    02/26/24 0613 02/26/24 1806 02/27/24 0137 02/28/24 0649  HGB 14.0  --  13.9 13.1  HCT 40.1  --  39.7 37.9  PLT 169  --  164 169  HEPARINUNFRC  --  0.68 0.68 0.39  CREATININE 0.87  --  0.80  --     Estimated Creatinine Clearance: 61.5 mL/min (by C-G formula based on SCr of 0.8 mg/dL).   Medical History: Past Medical History:  Diagnosis Date   Acute deep vein thrombosis (DVT) of left peroneal vein (HCC) 02/27/2024   Acute deep vein thrombosis (DVT) of popliteal vein of left lower extremity (HCC) 02/27/2024   Acute deep vein thrombosis (DVT) of right femoral vein (HCC) 02/27/2024   Acute pulmonary embolism (HCC) 02/26/2024   Allergy    Cataract    Chronic narcotic use     takes Nucynta  for neuropathic pain   Headache    Hereditary and idiopathic peripheral neuropathy    followed by pcp   Hyperlipidemia    Hypertension    Hypokalemia    OA (osteoarthritis)    LEFT KNEE   Peripheral neuropathy     Medications: No anticoagulants PTA  Assessment: Pt is a 78 yoF presenting with otalgia. CTA revealed multiple bilateral PE, suggestive of RHS. Venous doppler + bilateral acute DVT. 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   Pharmacy consulted to dose heparin.   Significant Events: -12/3 PM: Heparin paused due to AMS, word finding difficulties. CT Head no acute intracranial abnormality. Heparin resumed. MRI brain ordered.  Today, 02/28/24 Heparin level = 0.39 remains therapeutic on heparin infusion of 1300 units/hr CBC WNL, stable No bleeding or complications reported  Goal of Therapy:  Heparin level 0.3-0.7 units/ml Monitor platelets by anticoagulation protocol: Yes   Plan:  Continue heparin infusion at 1300 units/hr  Check confirmatory 8 hour heparin level  CBC, heparin level daily Monitor for signs of bleeding  Ronal CHRISTELLA Rav, PharmD 02/28/2024,9:52 AM

## 2024-02-28 NOTE — Progress Notes (Signed)
 Mobility Specialist - Progress Note   02/28/24 0830  Mobility  Activity Ambulated with assistance  Level of Assistance Contact guard assist, steadying assist  Assistive Device Front wheel walker  Distance Ambulated (ft) 350 ft  Range of Motion/Exercises Active  Activity Response Tolerated fair  Mobility visit 1 Mobility  Mobility Specialist Start Time (ACUTE ONLY) 0810  Mobility Specialist Stop Time (ACUTE ONLY) 0830  Mobility Specialist Time Calculation (min) (ACUTE ONLY) 20 min   Pt was found sitting EOB and agreeable to mobilize. Pt stated feeling unwell due to not eating or sleeping. At EOS returned to sit on recliner chair with all needs met. Call bell in reach and RN in room.   Erminio Leos,  Mobility Specialist Can be reached via Secure Chat

## 2024-02-28 NOTE — Progress Notes (Signed)
 Pt states that she is very scared. I was able to assure her that she as being taking care of. She told me she had not slept in four days. I gave her a back run, her nightly meds and turned on the relaxation channel.

## 2024-02-29 ENCOUNTER — Other Ambulatory Visit (HOSPITAL_BASED_OUTPATIENT_CLINIC_OR_DEPARTMENT_OTHER): Payer: Self-pay

## 2024-02-29 ENCOUNTER — Other Ambulatory Visit (HOSPITAL_COMMUNITY): Payer: Self-pay

## 2024-02-29 DIAGNOSIS — I82452 Acute embolism and thrombosis of left peroneal vein: Secondary | ICD-10-CM | POA: Diagnosis not present

## 2024-02-29 DIAGNOSIS — I82432 Acute embolism and thrombosis of left popliteal vein: Secondary | ICD-10-CM | POA: Diagnosis not present

## 2024-02-29 DIAGNOSIS — H6690 Otitis media, unspecified, unspecified ear: Secondary | ICD-10-CM

## 2024-02-29 DIAGNOSIS — I2699 Other pulmonary embolism without acute cor pulmonale: Secondary | ICD-10-CM | POA: Diagnosis not present

## 2024-02-29 LAB — BASIC METABOLIC PANEL WITH GFR
Anion gap: 9 (ref 5–15)
BUN: 22 mg/dL (ref 8–23)
CO2: 22 mmol/L (ref 22–32)
Calcium: 9.2 mg/dL (ref 8.9–10.3)
Chloride: 106 mmol/L (ref 98–111)
Creatinine, Ser: 0.82 mg/dL (ref 0.44–1.00)
GFR, Estimated: >60 mL/min (ref >60)
Glucose, Bld: 95 mg/dL (ref 70–99)
Potassium: 3.8 mmol/L (ref 3.5–5.1)
Sodium: 138 mmol/L (ref 135–145)

## 2024-02-29 LAB — CBC
HCT: 37.5 % (ref 36.0–46.0)
Hemoglobin: 13 g/dL (ref 12.0–15.0)
MCH: 31.1 pg (ref 26.0–34.0)
MCHC: 34.7 g/dL (ref 30.0–36.0)
MCV: 89.7 fL (ref 80.0–100.0)
Platelets: 175 K/uL (ref 150–400)
RBC: 4.18 MIL/uL (ref 3.87–5.11)
RDW: 12.6 % (ref 11.5–15.5)
WBC: 5.5 K/uL (ref 4.0–10.5)
nRBC: 0 % (ref 0.0–0.2)

## 2024-02-29 LAB — HEPARIN LEVEL (UNFRACTIONATED): Heparin Unfractionated: 0.6 [IU]/mL (ref 0.30–0.70)

## 2024-02-29 MED ORDER — APIXABAN 5 MG PO TABS: 5.0000 mg | ORAL_TABLET | Freq: Two times a day (BID) | ORAL | Status: DC

## 2024-02-29 MED ORDER — APIXABAN 5 MG PO TABS
10.0000 mg | ORAL_TABLET | Freq: Two times a day (BID) | ORAL | Status: DC
  Administered 2024-02-29: 10 mg via ORAL
  Filled 2024-02-29: qty 2

## 2024-02-29 MED ORDER — APIXABAN 5 MG PO TABS
5.0000 mg | ORAL_TABLET | Freq: Two times a day (BID) | ORAL | 1 refills | Status: DC
  Filled 2024-02-29: qty 60, 30d supply, fill #0

## 2024-02-29 MED ORDER — APIXABAN (ELIQUIS) VTE STARTER PACK (10MG AND 5MG)
ORAL_TABLET | ORAL | 0 refills | Status: AC
  Filled 2024-02-29: qty 74, 30d supply, fill #0

## 2024-02-29 MED ORDER — AMOXICILLIN-POT CLAVULANATE 875-125 MG PO TABS
1.0000 | ORAL_TABLET | Freq: Two times a day (BID) | ORAL | 0 refills | Status: AC
  Filled 2024-02-29: qty 14, 7d supply, fill #0

## 2024-02-29 NOTE — Discharge Summary (Addendum)
 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  No previous studies on file.  Asymptomatic.  Suggest referral to cardiology as an outpatient.   Home health PT. Monitor closely on anticoagulation.  Resolution of right sided acute otitis media   Unintentional weight loss Given acute PEs further evaluation for occult malignancy could be considered in the outpatient setting.  CT abdomen pelvis here was unrevealing.  Discharge Diagnoses: Principal Problem:   Acute pulmonary embolism (HCC) Active Problems:   Hypertensive urgency   Acute otitis media   Essential hypertension, benign   History of traumatic subdural hematoma   Mixed hyperlipidemia   Idiopathic peripheral neuropathy   Uncomplicated opioid dependence (HCC)   Presbycusis of both ears   Lumbar spondylosis   Unintentional weight loss   Acute deep vein thrombosis (DVT) of right femoral vein (HCC)   Acute deep vein thrombosis (DVT) of popliteal vein of left lower extremity (HCC)   Acute deep vein thrombosis (DVT) of left peroneal vein (HCC)  Resolved Problems:   * No resolved hospital problems. *  Hospital Course: 78 year old woman PMH including subdural hematoma status post fall, presented to ED with severe right ear pain, upper respiratory symptoms, cough, dyspnea on exertion and shortness of breath, weight loss of 25 pounds over the last year which was evaluated with colonoscopy.  CT revealed bilateral pulmonary emboli with concern for right heart strain, admitted for further evaluation.  Also found to have  bilateral lower extremity DVTs.  Treated for acute otitis media.  Confusion became apparent and on further questioning was subacute and most likely related to anxiety and improved spontaneously.  Overall did well and was discharged home in stable condition.  Consultants None  Procedures/Events 12/2 admission for bilateral PE   * Acute pulmonary embolism (HCC) Acute DVT right common femoral vein Acute DVT left popliteal vein, left peroneal vein Elevated troponin Presented with shortness of breath.  Hemodynamic stable.  No hypoxia. CT showed multiple bilateral pulmonary emboli involving the left upper lobar, left upper segmental, lingular, left lower lobar, right lower lobar, proximal right upper lobar, and right middle lobe segmental pulmonary arteries.   Bilateral lower extremity venous Doppler showed bilateral DVT. 2d echocardiogram showed normal RV systolic function. Inciting event may have been prolonged immobility for about 6 days when she did not get out of bed.  Also unintentional weight loss noted last year.  Reviewing the record she was seen by gastroenterology August 2024, and at that time had an EGD and colonoscopy which were unrevealing.  CT of the abdomen and pelvis as well as MRI of the pancreas were unrevealing.  No follow-up imaging was recommended.  The studies were conducted specifically for related to thrive and unexplained weight loss. CT abdomen pelvis here was unremarkable as well as CT head.   Confusion Memory loss Anxiety Noted by staff to be confused and have some memory loss.  Multiple examinations were suggestive of severe anxiety and overwhelm.  Send no focal neurologic deficits.  CT head was negative.  Much improved today, appears to be back to baseline.  Presume secondary to anxiety.  Canceled MRI.  Hypertensive urgency Benign essential hypertension Stable.  Continue metoprolol .   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  No  previous studies on file.  Asymptomatic.  Suggest referral to cardiology as an outpatient.   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   Therapy evaluation appreciated, home health PT. Monitor closely on anticoagulation.   Uncomplicated opioid dependence (HCC) Continue Nucynta    Benign paroxysmal positional vertigo No acute issues.  Appears not to be on medication for this   Idiopathic peripheral neuropathy Not on gabapentin or Lyrica.  Appears she takes meloxicam  and Nucynta . Fall precautions.  Therapy evaluation--Home health PT   Mixed hyperlipidemia Continue home fenofibrate .   Unintentional weight loss Patient notes 25 pound weight loss in a short amount of time last year.  States this was evaluated without cause being found, included colonoscopy.  She reports typically being up-to-date on mammograms but has not had one yet this year. Recommend mammogram be scheduled, discussed with patient Given acute PEs further evaluation for occult malignancy could be considered in the outpatient setting.  CT abdomen pelvis here was unrevealing.   Hypokalemia  Disposition: Home health Diet recommendation:  Regular diet DISCHARGE MEDICATION: Allergies as of 02/29/2024       Reactions   Keflex  [cephalexin ] Other (See Comments)   GI, couldn't eat/drink, felt very sick   Statins Other (See Comments)   Myalgia, turns red   Zetia  [ezetimibe ] Other (See Comments)   Myopathy/redness   Sulfa Antibiotics Rash, Other (See Comments)   Pt not sure if she is allergic to sulfa or penicillin so she doesn't take either one.         Medication List     STOP taking these medications    meloxicam  15 MG tablet Commonly known as: MOBIC        TAKE these medications    amoxicillin -clavulanate 875-125 MG tablet Commonly known as: AUGMENTIN  Take 1 tablet by mouth every 12 (twelve) hours for 7  days.   Eliquis  DVT/PE Starter Pack Generic drug: Apixaban  Starter Pack (10mg  and 5mg ) Take as directed on package: start with two-5mg  tablets twice daily for 7 days. On day 8, switch to one-5mg  tablet twice daily.   Eliquis  5 MG Tabs tablet Generic drug: apixaban  Take 1 tablet (5 mg total) by mouth 2 (two) times daily. Start in January after you complete the Apixaban  starter pack. Do not start until you have finished the starter pack. Start taking on: March 30, 2024   fenofibrate  160 MG tablet TAKE 1 TABLET BY MOUTH EVERY DAY   metoprolol  succinate 50 MG 24 hr tablet Commonly known as: TOPROL -XL Take with or immediately following a meal. What changed:  how much to take how to take this when to take this   Nucynta  100 MG Tabs Generic drug: Tapentadol  HCl Take 1 tablet (100 mg total) by mouth 4 (four) times daily. What changed: Another medication with the same name was removed. Continue taking this medication, and follow the directions you see here.   potassium chloride  10 MEQ tablet Commonly known as: KLOR-CON  TAKE 1 TABLET BY MOUTH 2 TIMES DAILY.   prednisoLONE  acetate 1 % ophthalmic suspension Commonly known as: PRED FORTE  Place 1 drop into the right eye daily.   traZODone  150 MG tablet Commonly known as: DESYREL  Take 1 tablet (150 mg total) by mouth at bedtime. What changed:  when to take this reasons to take this  Follow-up Information     Stacks, Butler, MD. Schedule an appointment as soon as possible for a visit in 1 week(s).   Specialty: Family Medicine Contact information: [address] [place] [address] [phone]                Feels better  Discharge Exam: Brittany Mercer   02/26/24 0800  Weight: 76 kg   Physical Exam Vitals reviewed.  Constitutional:      General: She is not in acute distress.    Appearance: She is not ill-appearing or toxic-appearing.  HENT:     Ears:     Comments: Right ear with substantial cerumen  impaction, tympanic membrane red and inflamed Cardiovascular:     Rate and Rhythm: Normal rate and regular rhythm.     Heart sounds: No murmur heard. Pulmonary:     Effort: Pulmonary effort is normal. No respiratory distress.     Breath sounds: No wheezing, rhonchi or rales.  Neurological:     Mental Status: She is alert and oriented to person, place, and time.  Psychiatric:        Mood and Affect: Mood normal.        Behavior: Behavior normal.      Condition at discharge: good  The results of significant diagnostics from this hospitalization (including imaging, microbiology, ancillary and laboratory) are listed below for reference.   Imaging Studies: CT ABDOMEN PELVIS W CONTRAST Result Date: 02/28/2024 CLINICAL DATA:  Weight loss. Extensive PE and DVT. Evaluate for mass. EXAM: CT ABDOMEN AND PELVIS WITH CONTRAST TECHNIQUE: Multidetector CT imaging of the abdomen and pelvis was performed using the standard protocol following bolus administration of intravenous contrast. RADIATION DOSE REDUCTION: This exam was performed according to the departmental dose-optimization program which includes automated exposure control, adjustment of the mA and/or kV according to patient size and/or use of iterative reconstruction technique. CONTRAST:  OMNIPAQUE  IOHEXOL  300 MG/ML  SOLN COMPARISON:  Recent chest CT. Abdominopelvic CT 11/17/2022, abdominal MRI 12/15/2022 FINDINGS: Lower chest: Known bilateral pulmonary emboli in the lower lobes. No lower lobe pulmonary infarct. Subsegmental atelectasis or scarring in the right lower lobe. Hepatobiliary: Few scattered tiny hepatic hypodensities are unchanged from prior exam. No suspicious liver lesion. The gallbladder is decompressed. No calcified gallstone. No pericholecystic inflammation. Mild common bile duct dilatation at 8 mm, without significant interval change. Normal tapering distally. Pancreas: Unremarkable. No pancreatic ductal dilatation or  surrounding inflammatory changes. The cystic lesion adjacent to the pancreas on prior MRI is not demonstrated on the current exam. There is no evidence of pancreatic mass. Spleen: 17 mm low-density lesion in the central spleen is nonspecific, but unchanged from prior. The spleen is upper normal in size, 12.1 cm AP. Adrenals/Urinary Tract: No adrenal nodule or mass. Unremarkable appearance of the kidneys without hydronephrosis, inflammation, or suspicious lesion. Unremarkable urinary bladder. Stomach/Bowel: Small hiatal hernia. No small bowel obstruction, wall thickening or inflammation, enteric contrast reaches the colon. The appendix is normal. Moderate colonic stool burden. Colonic diverticula without diverticulitis. No obvious colonic wall thickening or mass. Vascular/Lymphatic: No obvious filling defects within the included iliac veins or IVC. The portal, splenic, and mesenteric veins are patent. No suspicious lymphadenopathy. Reproductive: Hysterectomy. No adnexal mass. Quiescent right ovary tentatively visualized. The left ovary is not seen. Other: No free air, free fluid, or intra-abdominal fluid collection. No abdominal wall or inguinal hernia. Musculoskeletal: Scoliosis and degenerative change throughout the spine. There are no acute or suspicious osseous abnormalities. IMPRESSION: 1. No  evidence of malignancy in the abdomen or pelvis. 2. Known bilateral pulmonary emboli in the lower lobes. 3. Colonic diverticulosis without diverticulitis. 4. Small hiatal hernia. 5. Stable low-density lesion in the central spleen, nonspecific, but unchanged from prior exam. 6. The cystic lesion adjacent to the pancreas on prior MRI is not demonstrated on the current exam. There is no evidence of pancreatic mass. Electronically Signed   By: Andrea Gasman M.D.   On: 02/28/2024 16:21   CT HEAD WO CONTRAST ( ) Result Date: 02/27/2024 EXAM: CT HEAD WITHOUT CONTRAST 02/27/2024 06:51:00 PM TECHNIQUE: CT of the head was  performed without the administration of intravenous contrast. Automated exposure control, iterative reconstruction, and/or weight based adjustment of the mA/kV was utilized to reduce the radiation dose to as low as reasonably achievable. COMPARISON: 11/12/2023 CLINICAL HISTORY: Mental status change, unknown cause; admitted for PE, DVT, on heparin , confusion, difficulty answering questions. FINDINGS: BRAIN AND VENTRICLES: No acute hemorrhage. No evidence of acute infarct. No hydrocephalus. No extra-axial collection. No mass effect or midline shift. Periventricular white matter changes, likely sequela of chronic small vessel ischemic disease. Atherosclerotic calcifications in intracranial carotid and vertebral arteries. ORBITS: Right lens replacement. SINUSES: Mucosal thickening in ethmoid air cells. Mild mucosal thickening in left maxillary sinus. Fluid in right mastoid air cells. SOFT TISSUES AND SKULL: No acute soft tissue abnormality. No skull fracture. IMPRESSION: 1. No acute intracranial abnormality. 2. Periventricular white matter changes, likely sequela of chronic small vessel ischemic disease. Electronically signed by: Franky Stanford MD 02/27/2024 08:01 PM EST RP Workstation: [id number]   ECHOCARDIOGRAM COMPLETE Result Date: 02/27/2024    ECHOCARDIOGRAM REPORT   Patient Name:   Brittany Mercer Date of Exam: 02/27/2024 Medical Rec #:  [id number]                Height:       69.0 in Accession #:    [id number]               Weight:       167.5 lb Date of Birth:  [birth year]                BSA:          1.916 m Patient Age:    78 years                 BP:           175/88 mmHg Patient Gender: F                        HR:           87 bpm. Exam Location:  Inpatient Procedure: 2D Echo, Cardiac Doppler and Color Doppler (Both Spectral and Color            Flow Doppler were utilized during procedure). Indications:    I26.02 Pulmonary embolus  History:        Patient has no prior history of Echocardiogram  examinations.                 Risk Factors:Hypertension and Dyslipidemia. SDH.  Sonographer:    Ellouise Mose RDCS Referring Phys: [id number] [facility] A GRIFFITH  Sonographer Comments: Technically difficult study due to poor echo windows and suboptimal parasternal window. IMPRESSIONS  1. The LV is hyperdynamic with evidence of intracavitary gradient with PG 20 mmHg at rest with no significant change w/ valsalva. Left ventricular ejection fraction, by estimation, is >75%. Left ventricular ejection fraction  by 2D MOD biplane is 71.5 %.  The left ventricle has hyperdynamic function. The left ventricle has no regional wall motion abnormalities. There is moderate concentric left ventricular hypertrophy. Left ventricular diastolic parameters were normal. The average left ventricular global  longitudinal strain is -24.3 %. The global longitudinal strain is normal.  2. Right ventricular systolic function is normal. The right ventricular size is normal. There is moderately elevated pulmonary artery systolic pressure. The estimated right ventricular systolic pressure is 42.9 mmHg.  3. The mitral valve is degenerative. Trivial mitral valve regurgitation. Mild mitral stenosis. Moderate mitral annular calcification.  4. Tricuspid valve regurgitation is moderate.  5. The aortic valve was not well visualized. Aortic valve regurgitation is not visualized. No aortic stenosis is present. Comparison(s): No prior Echocardiogram. Conclusion(s)/Recommendation(s): Findings consistent with hypertrophic obstructive cardiomyopathy. FINDINGS  Left Ventricle: The LV is hyperdynamic with evidence of intracavitary gradient with PG 20 mmHg at rest with no significant change w/ valsalva. Left ventricular ejection fraction, by estimation, is >75%. Left ventricular ejection fraction by 2D MOD biplane is 71.5 %. The left ventricle has hyperdynamic function. The left ventricle has no regional wall motion abnormalities. The average left ventricular global  longitudinal strain is -24.3 %. Strain was performed and the global longitudinal strain is normal. The left ventricular internal cavity size was small. There is moderate concentric left ventricular hypertrophy. Left ventricular diastolic parameters were normal. Right Ventricle: The right ventricular size is normal. No increase in right ventricular wall thickness. Right ventricular systolic function is normal. There is moderately elevated pulmonary artery systolic pressure. The tricuspid regurgitant velocity is 3.16 m/s, and with an assumed right atrial pressure of 3 mmHg, the estimated right ventricular systolic pressure is 42.9 mmHg. Left Atrium: Left atrial size was normal in size. Right Atrium: Right atrial size was normal in size. Pericardium: There is no evidence of pericardial effusion. Mitral Valve: The mitral valve is degenerative in appearance. Moderate mitral annular calcification. Trivial mitral valve regurgitation. Mild mitral valve stenosis. MV peak gradient, 11.7 mmHg. The mean mitral valve gradient is 4.0 mmHg. Tricuspid Valve: The tricuspid valve is normal in structure. Tricuspid valve regurgitation is moderate . No evidence of tricuspid stenosis. Aortic Valve: The aortic valve was not well visualized. Aortic valve regurgitation is not visualized. No aortic stenosis is present. Aortic valve mean gradient measures 6.0 mmHg. Aortic valve peak gradient measures 11.7 mmHg. Aortic valve area, by VTI measures 2.73 cm. Pulmonic Valve: The pulmonic valve was not well visualized. Pulmonic valve regurgitation is not visualized. No evidence of pulmonic stenosis. Aorta: The aortic root and ascending aorta are structurally normal, with no evidence of dilitation. Venous: The right upper pulmonary vein is normal. IAS/Shunts: No atrial level shunt detected by color flow Doppler.  LEFT VENTRICLE PLAX 2D                        Biplane EF (MOD) LVIDd:         3.80 cm         LV Biplane EF:   Left LVIDs:         2.50  cm                          ventricular LV PW:         1.20 cm  ejection LV IVS:        1.90 cm                          fraction by LVOT diam:     2.10 cm                          2D MOD LV SV:         78                               biplane is LV SV Index:   41                               71.5 %. LVOT Area:     3.46 cm                                Diastology                                LV e' medial:    5.98 cm/s LV Volumes (MOD)               LV E/e' medial:  9.9 LV vol d, MOD    47.4 ml       LV e' lateral:   6.31 cm/s A2C:                           LV E/e' lateral: 9.4 LV vol d, MOD    69.7 ml A4C:                           2D Longitudinal LV vol s, MOD    16.5 ml       Strain A2C:                           2D Strain GLS   -24.3 % LV vol s, MOD    15.5 ml       Avg: A4C: LV SV MOD A2C:   30.9 ml LV SV MOD A4C:   69.7 ml LV SV MOD BP:    42.3 ml RIGHT VENTRICLE             IVC RV S prime:     11.78 cm/s  IVC diam: 1.60 cm TAPSE (M-mode): 1.6 cm                             PULMONARY VEINS                             Diastolic Velocity: 36.60 cm/s                             S/D Velocity:       1.80                             Systolic  Velocity:  66.20 cm/s LEFT ATRIUM           Index        RIGHT ATRIUM          Index LA diam:      2.80 cm 1.46 cm/m   RA Area:     9.88 cm LA Vol (A2C): 24.3 ml 12.68 ml/m  RA Volume:   16.50 ml 8.61 ml/m LA Vol (A4C): 27.9 ml 14.56 ml/m  AORTIC VALVE                     PULMONIC VALVE AV Area (Vmax):    2.61 cm      PV Vmax:       1.86 m/s AV Area (Vmean):   2.70 cm      PV Peak grad:  13.8 mmHg AV Area (VTI):     2.73 cm AV Vmax:           171.00 cm/s AV Vmean:          115.000 cm/s AV VTI:            0.287 m AV Peak Grad:      11.7 mmHg AV Mean Grad:      6.0 mmHg LVOT Vmax:         129.00 cm/s LVOT Vmean:        89.600 cm/s LVOT VTI:          0.226 m LVOT/AV VTI ratio: 0.79  AORTA Ao Root diam: 2.90 cm Ao Asc diam:  2.90 cm MITRAL VALVE                 TRICUSPID VALVE MV Area (PHT): 3.48 cm     TR Peak grad:   39.9 mmHg MV Area VTI:   2.39 cm     TR Vmax:        316.00 cm/s MV Peak grad:  11.7 mmHg MV Mean grad:  4.0 mmHg     SHUNTS MV Vmax:       1.71 m/s     Systemic VTI:  0.23 m MV Vmean:      94.3 cm/s    Systemic Diam: 2.10 cm MV Decel Time: 218 msec MV E velocity: 59.40 cm/s MV A velocity: 134.00 cm/s MV E/A ratio:  0.44 Franck Azobou Tonleu Electronically signed by Joelle Cedars Tonleu Signature Date/Time: 02/27/2024/11:22:53 AM    Final    VAS US  LOWER EXTREMITY VENOUS (DVT) Result Date: 02/26/2024  Lower Venous DVT Study Patient Name:  Brittany Mercer  Date of Exam:   02/26/2024 Medical Rec #: [id number]                 Accession #:    [id number] Date of Birth: 1945-06-01                 Patient Gender: F Patient Age:   81 years Exam Location:  [facility] Procedure:      VAS US  LOWER EXTREMITY VENOUS (DVT) Referring Phys: BURNARD GRIFFITH --------------------------------------------------------------------------------  Indications: Pulmonary embolism.  Risk Factors: Confirmed PE. Anticoagulation: Heparin . Comparison Study: No prior studies. Performing Technologist: Cordella Collet RVT  Examination Guidelines: A complete evaluation includes B-mode imaging, spectral Doppler, color Doppler, and power Doppler as needed of all accessible portions of each vessel. Bilateral testing is considered an integral part of a complete examination. Limited examinations for reoccurring indications may be performed as noted. The reflux portion of the exam is performed  with the patient in reverse Trendelenburg.  +---------+---------------+---------+-----------+----------+--------------+ RIGHT    CompressibilityPhasicitySpontaneityPropertiesThrombus Aging +---------+---------------+---------+-----------+----------+--------------+ CFV      Partial        Yes      Yes                  Acute           +---------+---------------+---------+-----------+----------+--------------+ SFJ      Full                                                        +---------+---------------+---------+-----------+----------+--------------+ FV Prox  Full                                                        +---------+---------------+---------+-----------+----------+--------------+ FV Mid   Full                                                        +---------+---------------+---------+-----------+----------+--------------+ FV DistalFull                                                        +---------+---------------+---------+-----------+----------+--------------+ PFV      Full                                                        +---------+---------------+---------+-----------+----------+--------------+ POP      Full           Yes      Yes                                 +---------+---------------+---------+-----------+----------+--------------+ PTV      Full                                                        +---------+---------------+---------+-----------+----------+--------------+ PERO     Full                                                        +---------+---------------+---------+-----------+----------+--------------+ EIV                     Yes      Yes                                 +---------+---------------+---------+-----------+----------+--------------+   +---------+---------------+---------+-----------+----------+--------------+  LEFT     CompressibilityPhasicitySpontaneityPropertiesThrombus Aging +---------+---------------+---------+-----------+----------+--------------+ CFV      Full           Yes      Yes                                 +---------+---------------+---------+-----------+----------+--------------+ SFJ      Full                                                         +---------+---------------+---------+-----------+----------+--------------+ FV Prox  Full                                                        +---------+---------------+---------+-----------+----------+--------------+ FV Mid   Full                                                        +---------+---------------+---------+-----------+----------+--------------+ FV DistalFull                                                        +---------+---------------+---------+-----------+----------+--------------+ PFV      Full                                                        +---------+---------------+---------+-----------+----------+--------------+ POP      Partial        Yes      Yes                  Acute          +---------+---------------+---------+-----------+----------+--------------+ PTV      Full                                                        +---------+---------------+---------+-----------+----------+--------------+ PERO     None                                         Acute          +---------+---------------+---------+-----------+----------+--------------+     Summary: RIGHT: - Findings consistent with acute deep vein thrombosis involving the right common femoral vein.  - No cystic structure found in the popliteal fossa.  LEFT: - Findings consistent with acute deep vein thrombosis involving the left popliteal vein, and left peroneal veins.  - No cystic structure found in the popliteal fossa.  *  See table(s) above for measurements and observations. Electronically signed by Debby Robertson on 02/26/2024 at 4:23:06 PM.    Final    CT Angio Chest PE W and/or Wo Contrast Result Date: 02/26/2024 EXAM: CTA CHEST 02/26/2024 07:55:41 AM TECHNIQUE: CTA of the chest was performed after the administration of 76 mL iohexol  (OMNIPAQUE ) 350 MG/ML injection 100 mL IOHEXOL  350 MG/ML SOLN. Multiplanar reformatted images are provided for review. MIP images are provided  for review. Automated exposure control, iterative reconstruction, and/or weight based adjustment of the mA/kV was utilized to reduce the radiation dose to as low as reasonably achievable. COMPARISON: 10/18/2022 CLINICAL HISTORY: Pulmonary embolism (PE) suspected, high prob. FINDINGS: PULMONARY ARTERIES: Pulmonary arteries are adequately opacified for evaluation. Filling defects identified within the left upper lobar, left upper segmental, lingular pulmonary artery, left lower lobar pulmonary artery, and right lower lobar pulmonary artery. Clot also noted within the proximal right upper lobar pulmonary artery and segmental arteries of the right middle lobe. Main pulmonary artery measures 3.4 cm. MEDIASTINUM: The heart demonstrates an RV to LV ratio of 1.2. No pericardial effusion. Aortic atherosclerotic calcifications. There is no acute abnormality of the thoracic aorta. LYMPH NODES: No mediastinal, hilar or axillary lymphadenopathy. LUNGS AND PLEURA: Scarring within the lingula and right lower lobe appears unchanged. A faint ground glass nodule in the right upper lobe measures 4 mm, image 67/6. No focal consolidation or pulmonary edema. No evidence of pleural effusion or pneumothorax. UPPER ABDOMEN: Small hiatal hernia. Unchanged cyst within the spleen measuring 1.6 cm, image 136/4. SOFT TISSUES AND BONES: Thoracolumbar scoliosis deformity. No acute or suspicious osseous findings. No acute soft tissue abnormality. IMPRESSION: 1. Multiple bilateral pulmonary emboli involving the left upper lobar, left upper segmental, lingular, left lower lobar, right lower lobar, proximal right upper lobar, and right middle lobe segmental pulmonary arteries. 2. RV to LV ratio is equal to 1.2, suggestive of right heart strain. 3. Critical results were called to the ordering provider at the time of interpretation. I personally spoke with Dr. mannie, who acknowledged these findings. Electronically signed by: Waddell Calk MD  02/26/2024 08:08 AM EST RP Workstation: [id number]   DG Chest 2 View Result Date: 02/26/2024 EXAM: 2 VIEW(S) XRAY OF THE CHEST 02/26/2024 06:09:00 AM COMPARISON: CT chest 10/18/2022 CLINICAL HISTORY: SOB FINDINGS: LUNGS AND PLEURA: No focal pulmonary opacity. No pleural effusion. No pneumothorax. HEART AND MEDIASTINUM: No acute abnormality of the cardiac and mediastinal silhouettes. BONES AND SOFT TISSUES: Degenerative changes of thoracic spine. S-shaped thoracolumbar curvature. IMPRESSION: 1. No acute cardiopulmonary abnormality to explain shortness of breath. Electronically signed by: Waddell Calk MD 02/26/2024 06:17 AM EST RP Workstation: [id number]    Microbiology: Results for orders placed or performed during the hospital encounter of 02/26/24  Resp panel by RT-PCR (RSV, Flu A&B, Covid) Anterior Nasal Swab     Status: None   Collection Time: 02/26/24  5:31 AM   Specimen: Anterior Nasal Swab  Result Value Ref Range Status   SARS Coronavirus 2 by RT PCR NEGATIVE NEGATIVE Final    Comment: (NOTE) SARS-CoV-2 target nucleic acids are NOT DETECTED.  The SARS-CoV-2 RNA is generally detectable in upper respiratory specimens during the acute phase of infection. The lowest concentration of SARS-CoV-2 viral copies this assay can detect is 138 copies/mL. A negative result does not preclude SARS-Cov-2 infection and should not be used as the sole basis for treatment or other patient management decisions. A negative result may occur with  improper specimen collection/handling, submission of specimen other than  nasopharyngeal swab, presence of viral mutation(s) within the areas targeted by this assay, and inadequate number of viral copies(<138 copies/mL). A negative result must be combined with clinical observations, patient history, and epidemiological information. The expected result is Negative.  Fact Sheet for Patients:  bloggercourse.com  Fact Sheet for Healthcare  Providers:  seriousbroker.it  This test is no t yet approved or cleared by the [place]  FDA and  has been authorized for detection and/or diagnosis of SARS-CoV-2 by FDA under an Emergency Use Authorization (EUA). This EUA will remain  in effect (meaning this test can be used) for the duration of the COVID-19 declaration under Section 564(b)(1) of the Act, 21 U.S.C.section 360bbb-3(b)(1), unless the authorization is terminated  or revoked sooner.       Influenza A by PCR NEGATIVE NEGATIVE Final   Influenza B by PCR NEGATIVE NEGATIVE Final    Comment: (NOTE) The Xpert Xpress SARS-CoV-2/FLU/RSV plus assay is intended as an aid in the diagnosis of influenza from Nasopharyngeal swab specimens and should not be used as a sole basis for treatment. Nasal washings and aspirates are unacceptable for Xpert Xpress SARS-CoV-2/FLU/RSV testing.  Fact Sheet for Patients: bloggercourse.com  Fact Sheet for Healthcare Providers: seriousbroker.it  This test is not yet approved or cleared by the [place]  FDA and has been authorized for detection and/or diagnosis of SARS-CoV-2 by FDA under an Emergency Use Authorization (EUA). This EUA will remain in effect (meaning this test can be used) for the duration of the COVID-19 declaration under Section 564(b)(1) of the Act, 21 U.S.C. section 360bbb-3(b)(1), unless the authorization is terminated or revoked.     Resp Syncytial Virus by PCR NEGATIVE NEGATIVE Final    Comment: (NOTE) Fact Sheet for Patients: bloggercourse.com  Fact Sheet for Healthcare Providers: seriousbroker.it  This test is not yet approved or cleared by the [place]  FDA and has been authorized for detection and/or diagnosis of SARS-CoV-2 by FDA under an Emergency Use Authorization (EUA). This EUA will remain in effect (meaning this test can be  used) for the duration of the COVID-19 declaration under Section 564(b)(1) of the Act, 21 U.S.C. section 360bbb-3(b)(1), unless the authorization is terminated or revoked.  Performed at [facility], [address], [place], [place] [address]     Labs: CBC: Recent Labs  Lab 02/26/24 [phone] 02/27/24 0137 02/28/24 0649 02/29/24 0403  WBC 9.9 6.5 6.4 5.5  NEUTROABS 7.7  --   --   --   HGB 14.0 13.9 13.1 13.0  HCT 40.1 39.7 37.9 37.5  MCV 88.9 89.8 89.8 89.7  PLT 169 164 169 175   Basic Metabolic Panel: Recent Labs  Lab 02/26/24 0613 02/27/24 0137 02/29/24 0403  NA 145 143 138  K 3.5 3.3* 3.8  CL 107 109 106  CO2 26 23 22   GLUCOSE 102* 88 95  BUN 21 23 22   CREATININE 0.87 0.80 0.82  CALCIUM  10.1 9.6 9.2   Liver Function Tests: No results for input(s): AST, ALT, ALKPHOS, BILITOT, PROT, ALBUMIN in the last 168 hours. CBG: No results for input(s): GLUCAP in the last 168 hours.  Discharge time spent: greater than 30 minutes.  Signed: Toribio Door, MD [facility] 02/29/2024

## 2024-02-29 NOTE — Progress Notes (Signed)
 Discharge meds in a secure bag delivered to patient by this RN

## 2024-02-29 NOTE — Progress Notes (Addendum)
 PHARMACY - ANTICOAGULATION CONSULT NOTE  Pharmacy Consult for heparin   Indication: acute pulmonary embolus and DVT  Allergies  Allergen Reactions   Keflex  [Cephalexin ] Other (See Comments)    GI, couldn't eat/drink, felt very sick   Statins Other (See Comments)    Myalgia, turns red   Zetia  [Ezetimibe ] Other (See Comments)    Myopathy/redness   Sulfa Antibiotics Rash and Other (See Comments)    Pt not sure if she is allergic to sulfa or penicillin so she doesn't take either one.     Patient Measurements: Height: 5' 9 (175.3 cm) Weight: 76 kg (167 lb 8.8 oz) IBW/kg (Calculated) : 66.2 HEPARIN  DW (KG): 76  Vital Signs: Temp: 98.1 F (36.7 C) (12/05 0438) BP: 124/67 (12/05 0438) Pulse Rate: 62 (12/05 0438)  Labs: Recent Labs    02/27/24 0137 02/28/24 0649 02/28/24 1503 02/29/24 0403  HGB 13.9 13.1  --  13.0  HCT 39.7 37.9  --  37.5  PLT 164 169  --  175  HEPARINUNFRC 0.68 0.39 0.41 0.60  CREATININE 0.80  --   --  0.82    Estimated Creatinine Clearance: 60 mL/min (by C-G formula based on SCr of 0.82 mg/dL).   Medical History: Past Medical History:  Diagnosis Date   Acute deep vein thrombosis (DVT) of left peroneal vein (HCC) 02/27/2024   Acute deep vein thrombosis (DVT) of popliteal vein of left lower extremity (HCC) 02/27/2024   Acute deep vein thrombosis (DVT) of right femoral vein (HCC) 02/27/2024   Acute pulmonary embolism (HCC) 02/26/2024   Allergy    Cataract    Chronic narcotic use    takes Nucynta  for neuropathic pain   Headache    Hereditary and idiopathic peripheral neuropathy    followed by pcp   Hyperlipidemia    Hypertension    Hypokalemia    OA (osteoarthritis)    LEFT KNEE   Peripheral neuropathy     Assessment: Patient is a 78 y.o F with 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  She was subsequently found to have acute bilateral  PE with RHS and bilateral LE DVT.  She is currently on heparin  drip for VTE treatment.   Significant Events: -12/3 PM: Heparin  paused due to AMS, word finding difficulties. CT Head no acute intracranial abnormality. Heparin  resumed. MRI brain ordered.  Today, 02/29/2024: - heparin  level is therapeutic at 0.60 - cbc stable - no bleeding documented  Goal of Therapy:  Heparin  level 0.3-0.7 units/ml Monitor platelets by anticoagulation protocol: Yes   Plan:  - Continue heparin  drip at 1300 units/hr - daily heparin  level and cbc - monitor for s/sx bleeding   Tayna Smethurst P 02/29/2024,9:13 AM  ________________________________________  Adden: Pharmacy has been consulted to transition patient to Eliquis  .  - d/c heparin  drip - start Eliquis  10 mg bid x7 days, then 5mg  bid - We will sign off. Re-consult us  if need further assistance.  Iantha Batch, PharmD, BCPS 02/29/2024 1:29 PM

## 2024-02-29 NOTE — Progress Notes (Signed)
 Physical Therapy Treatment Patient Details Name: Brittany Mercer MRN: [id number] DOB: 1946-03-13 Today's Date: 02/29/2024   History of Present Illness Pt is a 78y.o. female who presented on 02/26/24 with severe right ear pain, upper respiratory symptoms, cough, dyspnea on exertion, and weight loss of 25lbs over the last year. Pt found to have PE with concern for R heart strain and bil DVT.  PMH includes: HTN, hyperlipidemia, suberal hematoma after fall 01/2024, lumbar spondylosis, and peripheral neuropathy.    PT Comments  Pt making gradual progress. She ambulated 400' some with IV pole and other without AD.  Does have some instability with higher level balance and reports hx of falls.  Session focused on balance with ambulation and transfers.  Pt on RA with VSS.  Recommended use of cane or RW for safety at home and further therapy  for balance.     If plan is discharge home, recommend the following: Assistance with cooking/housework;A little help with walking and/or transfers;Help with stairs or ramp for entrance   Can travel by private vehicle        Equipment Recommendations  Rolling walker (2 wheels)    Recommendations for Other Services       Precautions / Restrictions Precautions Precautions: None Restrictions Weight Bearing Restrictions Per Provider Order: No     Mobility  Bed Mobility Overal bed mobility: Needs Assistance Bed Mobility: Supine to Sit, Sit to Supine     Supine to sit: Supervision Sit to supine: Supervision        Transfers Overall transfer level: Needs assistance Equipment used: None Transfers: Sit to/from Stand Sit to Stand: Contact guard assist           General transfer comment: STS x 3 during session    Ambulation/Gait Ambulation/Gait assistance: Contact guard assist, Supervision Gait Distance (Feet): 400 Feet Assistive device: IV Pole, None Gait Pattern/deviations: Step-through pattern, Decreased stride length Gait  velocity: decreased but fnctional     General Gait Details: Pt started wtih no AD but tending to hold hand in mid guard and reach out for wall, then switched to using IV pole for 300' with improved steadiness and no LOB.  Last 100' had pt ambulate without AD for balance.  Provided CGA when no AD used.  Advised cane at home.   Stairs             Wheelchair Mobility     Tilt Bed    Modified Rankin (Stroke Patients Only)       Balance Overall balance assessment: Needs assistance Sitting-balance support: Feet supported Sitting balance-Leahy Scale: Good     Standing balance support: No upper extremity supported Standing balance-Leahy Scale: Good Standing balance comment: Could ambulate without AD but improved stability wtih IV pole.  Performed below balance challenges               High Level Balance Comments: With walking without AD worked on looking up/down/L/R and changing speeds.  No LOB but did slow down with head turns.  In room worked on stepping forward, backward, adn R/L side steps around 2' square x 5.  Turned 360 in circle both direction x 2.  Provided CGA or close supervision with all.            Communication Communication Factors Affecting Communication: Hearing impaired  Cognition Arousal: Alert Behavior During Therapy: Anxious   PT - Cognitive impairments: No apparent impairments  PT - Cognition Comments: Reports feels like speech and word finding improving.  Does occasionally answer question incorrectly but related to hearing difficulty and when repeated able to respond appropriately        Cueing    Exercises      General Comments        Pertinent Vitals/Pain Pain Assessment Pain Assessment: 0-10 Pain Score: 6  Pain Location: R ear Pain Descriptors / Indicators: Aching, Sharp, Grimacing, Guarding Pain Intervention(s): Monitored during session    Home Living                          Prior  Function            PT Goals (current goals can now be found in the care plan section) Progress towards PT goals: Progressing toward goals    Frequency    Min 2X/week      PT Plan      Co-evaluation              AM-PAC PT 6 Clicks Mobility   Outcome Measure  Help needed turning from your back to your side while in a flat bed without using bedrails?: A Little Help needed moving from lying on your back to sitting on the side of a flat bed without using bedrails?: A Little Help needed moving to and from a bed to a chair (including a wheelchair)?: A Little Help needed standing up from a chair using your arms (e.g., wheelchair or bedside chair)?: A Little Help needed to walk in hospital room?: A Little Help needed climbing 3-5 steps with a railing? : A Little 6 Click Score: 18    End of Session Equipment Utilized During Treatment: Gait belt Activity Tolerance: Patient tolerated treatment well Patient left: in chair;with call bell/phone within reach;with chair alarm set Nurse Communication: Mobility status PT Visit Diagnosis: Unsteadiness on feet (R26.81)     Time: 8761-8740 PT Time Calculation (min) (ACUTE ONLY): 21 min  Charges:    $Gait Training: 8-22 mins PT General Charges $$ ACUTE PT VISIT: 1 Visit                     Benjiman, PT Acute Rehab Services [facility] [phone]    Benjiman VEAR Mulberry 02/29/2024, 2:07 PM

## 2024-02-29 NOTE — TOC Progression Note (Signed)
 Transition of Care [facility]) - Progression Note    Patient Details  Name: Brittany Mercer MRN: [id number] Date of Birth: 1946-02-25  [facility] CM/SW Contact  Tawni CHRISTELLA Eva, LCSW Phone Number: 02/29/2024, 10:49 AM  Clinical Narrative:     CSW spoke with Darryle from [facility], the have confirmed that pt is active with the program. She is requesting new HH orders to be fax to [phone]. ICM to follow.    Expected Discharge Plan: Home w Home Health Services ([facility]) Barriers to Discharge: Continued Medical Work up               Expected Discharge Plan and Services In-house Referral: Clinical Social Work   Post Acute Care Choice: Home Health Living arrangements for the past 2 months: Independent Living Facility                                       Social Drivers of Health (SDOH) Interventions SDOH Screenings   Food Insecurity: No Food Insecurity (02/26/2024)  Housing: Low Risk  (02/26/2024)  Transportation Needs: No Transportation Needs (02/26/2024)  Utilities: Not At Risk (02/26/2024)  Alcohol Screen: Low Risk  (10/08/2023)  Depression (PHQ2-9): Low Risk  (01/09/2024)  Financial Resource Strain: Low Risk  (10/08/2023)  Physical Activity: Inactive (10/08/2023)  Social Connections: Moderately Integrated (02/26/2024)  Stress: No Stress Concern Present (10/08/2023)  Tobacco Use: Medium Risk (02/26/2024)  Health Literacy: Adequate Health Literacy (07/26/2023)    Readmission Risk Interventions    02/23/2023   11:49 AM  Readmission Risk Prevention Plan  Post Dischage Appt Complete  Medication Screening Complete  Transportation Screening Complete

## 2024-02-29 NOTE — Progress Notes (Signed)
 AVS given to patient and explained at the bedside. Medications and follow up appointments have been explained with pt verbalizing understanding.

## 2024-03-03 ENCOUNTER — Telehealth: Payer: Self-pay

## 2024-03-03 ENCOUNTER — Ambulatory Visit: Payer: Self-pay

## 2024-03-03 NOTE — Telephone Encounter (Signed)
 FYI Only or Action Required?: FYI only for provider: appointment scheduled on 03/04/24.  Brittany Mercer was last seen in primary care on 01/09/2024 by Zollie Lowers, MD.  Called Nurse Triage reporting Ear Pain, Headache, Shortness of Breath.  Symptoms began Mercer week ago.  Interventions attempted: Prescription medications: Augmentin , Eliquis .  Symptoms are: right ear pain worsening, SOB unchanged.  Triage Disposition: See Physician Within 24 Hours  Brittany Mercer/caregiver understands and will follow disposition?: Yes          Copied from CRM [id number]. Topic: Clinical - Red Word Triage >> Mar 03, 2024  8:11 AM Brittany Mercer wrote: Red Word that prompted transfer to Nurse Triage: Brittany Mercer is having severe pain on her right side. Brittany Mercer has been in the hospital for an ear infection and has been given an antibiotic but it has not helped. Reason for Disposition  [1] Taking antibiotic > 72 hours (3 days) and [2] pain persists or recurs  Condition / symptoms WORSE  Answer Assessment - Initial Assessment Questions 1. ANTIBIOTIC: What antibiotic are you taking? How many times per day?     Augmentin  twice daily.  2. ONSET: When was the antibiotic started?     02/27/24.  3. LOCATION: Which ear is involved?     Right.  4. PAIN: How bad is the pain?   (Scale 0-10; none, mild, moderate or severe)     6/10.  5. FEVER: Do you have Mercer fever? If Yes, ask: What is your temperature, how was it measured, and when did it start?     No.  6. DISCHARGE: Is there any discharge? If Yes, ask: What color is it? (e.g., clear, white; yellow, green; bloody)     No.  7. OTHER SYMPTOMS: Do you have any other symptoms? (e.g., headache, stiff neck, dizziness, vomiting, runny nose)     Headache 6/10 top and back of head; nasal congestion. No vomiting or chest pain. Overall, generalized weakness. SOB worsens when talking Mercer lot and with activity, not worsening and states it was from the blood clots found  in the hospital.  Answer Assessment - Initial Assessment Questions 1. MAIN CONCERN OR SYMPTOM:  What is your main concern right now? What question do you have? What's the main symptom you're worried about? (e.g., breathing difficulty, ankle swelling, weight gain.)     Right ear pain and headache.  2. ONSET: When did the  symptoms  start?     Mercer week ago.  3. BETTER-SAME-WORSE: Are you getting better, staying the same, or getting worse compared to the day you were discharged?     Ear pain has gotten worse, Brittany Mercer is also experiencing SOB and states it is about the same (not worsened) from when she was hospitalized.  4. HOSPITALIZATION: How long were you hospitalized? (e.g., days)     12/2-12/5  5. DISCHARGE DIAGNOSIS:  What problem or disease were you hospitalized for?     Pulmonary embolus, 3 DVT's, acute otitis media.  6. DISCHARGE DATE: What date were you discharged from the hospital?     02/29/24  7. DISCHARGE DOCTOR: Who is the main doctor taking care of you now?     Dr Zollie.  8. DISCHARGE APPOINTMENT: Have you scheduled Mercer follow-up discharge appointment with your doctor?     No. She states she was told to follow up as soon as possible.  9. DISCHARGE MEDICINES: Did the doctor (or NP/PA) who discharged you order any new medicines for you to use? If yes, have you  filled the prescription and started taking the medicine?      Augmentin , Eliquis . She states she is taking both.  10. PAIN: Is there any pain? If Yes, ask: How bad is it?  (Scale 0-10; or none, mild, moderate, severe)       6/10.  11. FEVER: Do you have Mercer fever? If Yes, ask: What is it, how was it measured  and when did it start?       No.  12. OTHER SYMPTOMS: Do you have any other symptoms?       See other protocol. Denies unilateral numbness/weakness, changes in speech or vision, dizziness (states she was told to use Mercer cane and walker when discharged).  Protocols used: Ear - Otitis  Media Follow-up Call-Mercer-[facility], Post-Hospitalization Follow-up Call-Mercer-[facility]

## 2024-03-03 NOTE — Transitions of Care (Post Inpatient/ED Visit) (Signed)
 03/03/2024  Name: Brittany Mercer MRN: [id number] DOB: Oct 05, 1945  Today's TOC FU Call Status: Today's TOC FU Call Status:: Successful TOC FU Call Completed TOC FU Call Complete Date: 03/03/24  Patient's Name and Date of Birth confirmed. DOB, Name  Transition Care Management Follow-up Telephone Call Date of Discharge: 02/29/24 Discharge Facility: [facility]) Type of Discharge: Inpatient Admission Primary Inpatient Discharge Diagnosis:: Acute Pulmonary Embolism How have you been since you were released from the hospital?: Better Any questions or concerns?: Yes Patient Questions/Concerns:: (S) continues to have earache and headache; also concerned about medication discontinued by hospitalist Patient Questions/Concerns Addressed: Notified Provider of Patient Questions/Concerns  Items Reviewed: Did you receive and understand the discharge instructions provided?: Yes Medications obtained,verified, and reconciled?: Yes (Medications Reviewed) Any new allergies since your discharge?: No Dietary orders reviewed?: NA Do you have support at home?: Yes  Medications Reviewed Today: Medications Reviewed Today     Reviewed by Lavelle Charmaine NOVAK, LPN (Licensed Practical Nurse) on 03/03/24 at 1127  Med List Status: <None>   Medication Order Taking? Sig Documenting Provider Last Dose Status Informant  amoxicillin -clavulanate (AUGMENTIN ) 875-125 MG tablet [id number]  Take 1 tablet by mouth every 12 (twelve) hours for 7 days. Jadine Toribio SQUIBB, MD  Active   apixaban  (ELIQUIS ) 5 MG TABS tablet [id number]  Take 1 tablet (5 mg total) by mouth 2 (two) times daily. Start in January after you complete the Apixaban  starter pack. Do not start until you have finished the starter pack. Jadine Toribio SQUIBB, MD  Active   APIXABAN  (ELIQUIS ) VTE STARTER PACK (10MG  AND 5MG ) [id number]  Take as directed on package: start with two-5mg  tablets twice daily for 7 days. On day 8, switch to one-5mg  tablet twice  daily. Jadine Toribio SQUIBB, MD  Active   fenofibrate  160 MG tablet [id number] No TAKE 1 TABLET BY MOUTH EVERY DAY Stacks, Warren, MD 02/26/2024 Morning Active Self  metoprolol  succinate (TOPROL -XL) 50 MG 24 hr tablet [id number] No Take with or immediately following a meal.  Patient taking differently: Take 50 mg by mouth daily. Take with or immediately following a meal.   Zollie Lowers, MD 02/26/2024  8:00 AM Active Self  potassium chloride  (KLOR-CON ) 10 MEQ tablet [id number] No TAKE 1 TABLET BY MOUTH 2 TIMES DAILY. Zollie Lowers, MD 02/26/2024 Morning Active Self  prednisoLONE  acetate (PRED FORTE ) 1 % ophthalmic suspension [id number] No Place 1 drop into the right eye daily.  02/26/2024 Morning Active Self  Tapentadol  HCl (NUCYNTA ) 100 MG TABS [id number] No Take 1 tablet (100 mg total) by mouth 4 (four) times daily. Zollie Lowers, MD 02/25/2024 Bedtime Active Self  traZODone  (DESYREL ) 150 MG tablet [id number] No Take 1 tablet (150 mg total) by mouth at bedtime.  Patient taking differently: Take 150 mg by mouth at bedtime as needed for sleep.   Zollie Lowers, MD 02/23/2024 Active Self  Med List Note Jackolyn Waddell DEL, CPhT 02/26/24 1337): [facility] [phone] (Independent Living)            Home Care and Equipment/Supplies: Were Home Health Services Ordered?: Yes Any new equipment or medical supplies ordered?: NA  Functional Questionnaire: Do you need assistance with bathing/showering or dressing?: No Do you need assistance with meal preparation?: No Do you need assistance with eating?: No Do you have difficulty maintaining continence: No Do you need assistance with getting out of bed/getting out of a chair/moving?: Yes Do you have difficulty managing or taking your medications?: No  Follow up appointments  reviewed: PCP Follow-up appointment confirmed?: Yes Date of PCP follow-up appointment?: 03/04/24 Follow-up Provider: Ronal Quant [facility] Follow-up  appointment confirmed?: NA Do you need transportation to your follow-up appointment?: No Do you understand care options if your condition(s) worsen?: Yes-patient verbalized understanding    SIGNATURE Charmaine Bloodgood, LPN [facility] Advisor [facility] You Are. We Are. [facility] Direct Dial [phone]

## 2024-03-03 NOTE — Telephone Encounter (Signed)
 APPT MADE

## 2024-03-04 ENCOUNTER — Encounter: Payer: Self-pay | Admitting: Nurse Practitioner

## 2024-03-04 ENCOUNTER — Other Ambulatory Visit (HOSPITAL_BASED_OUTPATIENT_CLINIC_OR_DEPARTMENT_OTHER): Payer: Self-pay

## 2024-03-04 ENCOUNTER — Ambulatory Visit (INDEPENDENT_AMBULATORY_CARE_PROVIDER_SITE_OTHER): Admitting: Nurse Practitioner

## 2024-03-04 VITALS — BP 157/82 | HR 60 | Temp 97.5°F | Ht 69.0 in | Wt 162.0 lb

## 2024-03-04 DIAGNOSIS — K861 Other chronic pancreatitis: Secondary | ICD-10-CM | POA: Diagnosis not present

## 2024-03-04 DIAGNOSIS — G609 Hereditary and idiopathic neuropathy, unspecified: Secondary | ICD-10-CM

## 2024-03-04 DIAGNOSIS — H9201 Otalgia, right ear: Secondary | ICD-10-CM | POA: Diagnosis not present

## 2024-03-04 DIAGNOSIS — I2694 Multiple subsegmental pulmonary emboli without acute cor pulmonale: Secondary | ICD-10-CM

## 2024-03-04 DIAGNOSIS — S065XAA Traumatic subdural hemorrhage with loss of consciousness status unknown, initial encounter: Secondary | ICD-10-CM | POA: Diagnosis not present

## 2024-03-04 DIAGNOSIS — Z789 Other specified health status: Secondary | ICD-10-CM

## 2024-03-04 MED ORDER — NUCYNTA 100 MG PO TABS
100.0000 mg | ORAL_TABLET | Freq: Four times a day (QID) | ORAL | 0 refills | Status: DC
  Filled 2024-03-08 – 2024-03-10 (×2): qty 120, 30d supply, fill #0

## 2024-03-04 NOTE — Patient Instructions (Signed)
 Fall Prevention in the Home, Adult Falls can cause injuries and can happen to people of all ages. There are many things you can do to make your home safer and to help prevent falls. What actions can I take to prevent falls? General information Use good lighting in all rooms. Make sure to: Replace any light bulbs that burn out. Turn on the lights in dark areas and use night-lights. Keep items that you use often in easy-to-reach places. Lower the shelves around your home if needed. Move furniture so that there are clear paths around it. Do not use throw rugs or other things on the floor that can make you trip. If any of your floors are uneven, fix them. Add color or contrast paint or tape to clearly mark and help you see: Grab bars or handrails. First and last steps of staircases. Where the edge of each step is. If you use a ladder or stepladder: Make sure that it is fully opened. Do not climb a closed ladder. Make sure the sides of the ladder are locked in place. Have someone hold the ladder while you use it. Know where your pets are as you move through your home. What can I do in the bathroom?     Keep the floor dry. Clean up any water on the floor right away. Remove soap buildup in the bathtub or shower. Buildup makes bathtubs and showers slippery. Use non-skid mats or decals on the floor of the bathtub or shower. Attach bath mats securely with double-sided, non-slip rug tape. If you need to sit down in the shower, use a non-slip stool. Install grab bars by the toilet and in the bathtub and shower. Do not use towel bars as grab bars. What can I do in the bedroom? Make sure that you have a light by your bed that is easy to reach. Do not use any sheets or blankets on your bed that hang to the floor. Have a firm chair or bench with side arms that you can use for support when you get dressed. What can I do in the kitchen? Clean up any spills right away. If you need to reach something  above you, use a step stool with a grab bar. Keep electrical cords out of the way. Do not use floor polish or wax that makes floors slippery. What can I do with my stairs? Do not leave anything on the stairs. Make sure that you have a light switch at the top and the bottom of the stairs. Make sure that there are handrails on both sides of the stairs. Fix handrails that are broken or loose. Install non-slip stair treads on all your stairs if they do not have carpet. Avoid having throw rugs at the top or bottom of the stairs. Choose a carpet that does not hide the edge of the steps on the stairs. Make sure that the carpet is firmly attached to the stairs. Fix carpet that is loose or worn. What can I do on the outside of my home? Use bright outdoor lighting. Fix the edges of walkways and driveways and fix any cracks. Clear paths of anything that can make you trip, such as tools or rocks. Add color or contrast paint or tape to clearly mark and help you see anything that might make you trip as you walk through a door, such as a raised step or threshold. Trim any bushes or trees on paths to your home. Check to see if handrails are loose  or broken and that both sides of all steps have handrails. Install guardrails along the edges of any raised decks and porches. Have leaves, snow, or ice cleared regularly. Use sand, salt, or ice melter on paths if you live where there is ice and snow during the winter. Clean up any spills in your garage right away. This includes grease or oil spills. What other actions can I take? Review your medicines with your doctor. Some medicines can cause dizziness or changes in blood pressure, which increase your risk of falling. Wear shoes that: Have a low heel. Do not wear high heels. Have rubber bottoms and are closed at the toe. Feel good on your feet and fit well. Use tools that help you move around if needed. These include: Canes. Walkers. Scooters. Crutches. Ask  your doctor what else you can do to help prevent falls. This may include seeing a physical therapist to learn to do exercises to move better and get stronger. Where to find more information Centers for Disease Control and Prevention, STEADI: TonerPromos.no [employer] on Aging: BaseRingTones.pl National Institute on Aging: BaseRingTones.pl Contact a doctor if: You are afraid of falling at home. You feel weak, drowsy, or dizzy at home. You fall at home. Get help right away if you: Lose consciousness or have trouble moving after a fall. Have a fall that causes a head injury. These symptoms may be an emergency. Get help right away. Call 911. Do not wait to see if the symptoms will go away. Do not drive yourself to the hospital. This information is not intended to replace advice given to you by your health care provider. Make sure you discuss any questions you have with your health care provider. Document Revised: 11/14/2021 Document Reviewed: 11/14/2021 Elsevier Patient Education  2024 [employer].

## 2024-03-04 NOTE — Progress Notes (Signed)
 Subjective:    Patient ID: Brittany Mercer, female    DOB: [birth year], 78 y.o.   MRN: [id number]   Chief Complaint: Transitions Of Care   HPI Today's visit was for Transitional Care Management.  The patient was discharged from [facility] on 02/29/24 with a primary diagnosis of acute pulmonary embolism.   Contact with the patient and/or caregiver, by a clinical staff member, was made on 03/03/24 and was documented as a telephone encounter within the EMR.  Through chart review and discussion with the patient I have determined that management of their condition is of high complexity.   Patient went to the ED on 02/26/24. Originally c/o ear pain. Strange as it sounds , she ended up being dx with a pulmonary emboli along with 2 DVT's.  CT revealed bilateral pulmonary emboli with concern for right heart strain, admitted for further evaluation.  Also found to have bilateral lower extremity DVTs.  Treated for acute otitis media.  Confusion became apparent and on further questioning was subacute and most likely related to anxiety and improved spontaneously.  Overall did well and was discharged home in stable condition. Since she has been home she is still c/o right ear pain. Her breathing has done well.    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   Hypertensive urgency 02/26/2024   Acute otitis media 02/26/2024   Unintentional weight loss 02/26/2024   SDH (subdural hematoma) (HCC) 02/23/2023   Subdural hematoma (HCC) 02/22/2023   Lumbar spondylosis 01/03/2023   Low back pain 12/18/2022   Encounter for counseling 06/12/2022   Laceration of  face with complication 03/28/2022   History of Descemet membrane endothelial keratoplasty (DMEK) 01/25/2021   Pseudophakia, right eye 01/25/2021   Uncomplicated opioid dependence (HCC) 08/02/2018   Stiffness of left knee 04/12/2018   S/P left TKA 02/05/2018   S/P knee replacement 02/05/2018   Presbycusis of both ears 10/16/2016   S/P right TKA 01/25/2015   Benign paroxysmal positional vertigo 02/09/2014   Essential hypertension, benign 07/17/2012   Mixed hyperlipidemia 07/17/2012   Migraines 07/17/2012   Idiopathic peripheral neuropathy 07/17/2012   Hypokalemia 07/17/2012       Review of Systems  Constitutional:  Negative for diaphoresis.  Eyes:  Negative for pain.  Respiratory:  Negative for shortness of breath.   Cardiovascular:  Negative for chest pain, palpitations and leg swelling.  Gastrointestinal:  Negative for abdominal pain.  Endocrine: Negative for polydipsia.  Skin:  Negative for rash.  Neurological:  Negative for dizziness, weakness and headaches.  Hematological:  Does not bruise/bleed easily.  All other systems reviewed and are negative.      Objective:   Physical Exam Constitutional:      Appearance: Normal appearance.  HENT:     Right Ear: Tympanic membrane normal. Tenderness present. No drainage. Tympanic membrane is not erythematous.     Left Ear: Tympanic membrane normal.  Cardiovascular:     Rate and Rhythm: Normal rate and regular rhythm.     Heart sounds: Normal heart sounds.  Pulmonary:     Effort: Pulmonary effort is normal.     Breath sounds: Normal breath sounds.  Skin:    General: Skin is warm and dry.  Neurological:     General: No focal deficit present.     Mental Status: She is alert and oriented to person, place, and time.     BP (!) 157/82   Pulse 60   Temp (!) 97.5 F (36.4 C) (Temporal)   Ht 5' 9 (1.753 m)   Wt 162 lb (73.5 kg)   SpO2 96%   BMI 23.92 kg/m        Assessment & Plan:   Brittany Mercer in  today with chief complaint of Transitions Of Care   1. Transition of care (Primary) Hospital records reviewed  2. Multiple subsegmental pulmonary emboli without acute cor pulmonale (HCC) Stay on eliquis  Report any bleeding issues  3. Right ear pain Add flonase  nasal spray Finish antibiotic.     The above assessment and management plan was discussed with the patient. The patient verbalized understanding of and has agreed to the management plan. Patient is aware to call the clinic if symptoms persist or worsen. Patient is aware when to return to the clinic for a follow-up visit. Patient educated on when it is appropriate to go to the emergency department.   Mary-Margaret Gladis, FNP

## 2024-03-06 ENCOUNTER — Other Ambulatory Visit: Payer: Self-pay

## 2024-03-06 ENCOUNTER — Other Ambulatory Visit (HOSPITAL_BASED_OUTPATIENT_CLINIC_OR_DEPARTMENT_OTHER): Payer: Self-pay

## 2024-03-06 DIAGNOSIS — M6281 Muscle weakness (generalized): Secondary | ICD-10-CM | POA: Diagnosis not present

## 2024-03-08 ENCOUNTER — Other Ambulatory Visit (HOSPITAL_BASED_OUTPATIENT_CLINIC_OR_DEPARTMENT_OTHER): Payer: Self-pay

## 2024-03-10 ENCOUNTER — Other Ambulatory Visit (HOSPITAL_BASED_OUTPATIENT_CLINIC_OR_DEPARTMENT_OTHER): Payer: Self-pay

## 2024-03-10 ENCOUNTER — Other Ambulatory Visit: Payer: Self-pay

## 2024-03-11 ENCOUNTER — Other Ambulatory Visit (HOSPITAL_BASED_OUTPATIENT_CLINIC_OR_DEPARTMENT_OTHER): Payer: Self-pay

## 2024-03-11 DIAGNOSIS — M6281 Muscle weakness (generalized): Secondary | ICD-10-CM | POA: Diagnosis not present

## 2024-03-17 ENCOUNTER — Ambulatory Visit: Payer: Self-pay | Admitting: Family Medicine

## 2024-03-17 ENCOUNTER — Encounter: Payer: Self-pay | Admitting: Family Medicine

## 2024-03-17 ENCOUNTER — Other Ambulatory Visit (HOSPITAL_BASED_OUTPATIENT_CLINIC_OR_DEPARTMENT_OTHER): Payer: Self-pay

## 2024-03-17 VITALS — BP 122/69 | HR 62 | Temp 98.0°F | Ht 69.0 in | Wt 156.0 lb

## 2024-03-17 DIAGNOSIS — G609 Hereditary and idiopathic neuropathy, unspecified: Secondary | ICD-10-CM | POA: Diagnosis not present

## 2024-03-17 DIAGNOSIS — N898 Other specified noninflammatory disorders of vagina: Secondary | ICD-10-CM | POA: Diagnosis not present

## 2024-03-17 DIAGNOSIS — E782 Mixed hyperlipidemia: Secondary | ICD-10-CM | POA: Diagnosis not present

## 2024-03-17 DIAGNOSIS — R300 Dysuria: Secondary | ICD-10-CM

## 2024-03-17 DIAGNOSIS — E876 Hypokalemia: Secondary | ICD-10-CM | POA: Diagnosis not present

## 2024-03-17 LAB — MICROSCOPIC EXAMINATION
Bacteria, UA: NONE SEEN
RBC, Urine: NONE SEEN /HPF (ref 0–2)
Renal Epithel, UA: NONE SEEN /HPF
Yeast, UA: NONE SEEN

## 2024-03-17 LAB — URINALYSIS, ROUTINE W REFLEX MICROSCOPIC
Bilirubin, UA: NEGATIVE
Glucose, UA: NEGATIVE
Ketones, UA: NEGATIVE
Nitrite, UA: NEGATIVE
Protein,UA: NEGATIVE
Specific Gravity, UA: 1.015 (ref 1.005–1.030)
Urobilinogen, Ur: 0.2 mg/dL (ref 0.2–1.0)
pH, UA: 6 (ref 5.0–7.5)

## 2024-03-17 LAB — WET PREP FOR TRICH, YEAST, CLUE
Clue Cell Exam: NEGATIVE
Trichomonas Exam: NEGATIVE
Yeast Exam: NEGATIVE

## 2024-03-17 MED ORDER — APIXABAN 5 MG PO TABS
5.0000 mg | ORAL_TABLET | Freq: Two times a day (BID) | ORAL | 1 refills | Status: AC
  Filled 2024-03-17: qty 180, 90d supply, fill #0

## 2024-03-17 MED ORDER — FENOFIBRATE 160 MG PO TABS
160.0000 mg | ORAL_TABLET | Freq: Every day | ORAL | 3 refills | Status: AC
  Filled 2024-03-17: qty 90, 90d supply, fill #0

## 2024-03-17 MED ORDER — METOPROLOL SUCCINATE ER 50 MG PO TB24
50.0000 mg | ORAL_TABLET | Freq: Every day | ORAL | 3 refills | Status: AC
  Filled 2024-03-17: qty 90, 90d supply, fill #0

## 2024-03-17 MED ORDER — NUCYNTA 100 MG PO TABS
100.0000 mg | ORAL_TABLET | Freq: Four times a day (QID) | ORAL | 0 refills | Status: DC
  Filled 2024-04-07 – 2024-04-08 (×2): qty 120, 30d supply, fill #0

## 2024-03-17 MED ORDER — POTASSIUM CHLORIDE ER 10 MEQ PO TBCR
10.0000 meq | EXTENDED_RELEASE_TABLET | Freq: Two times a day (BID) | ORAL | 3 refills | Status: AC
  Filled 2024-03-17: qty 180, 90d supply, fill #0

## 2024-03-17 MED ORDER — TRAZODONE HCL 150 MG PO TABS
150.0000 mg | ORAL_TABLET | Freq: Every day | ORAL | 3 refills | Status: AC
  Filled 2024-03-17: qty 90, 90d supply, fill #0

## 2024-03-17 NOTE — Progress Notes (Signed)
 "  Subjective:  Patient ID: Brittany Mercer, female    DOB: 06-16-1945  Age: 78 y.o. MRN: [id number]  CC: Medical Management of Chronic Issues and Vaginal Itching (Vaginal itching and irritation. Constant lower back back. One week. )   HPI  Discussed the use of AI scribe software for clinical note transcription with the patient, who gave verbal consent to proceed.  History of Present Illness Brittany Mercer is a 78 year old female who presents with vaginal itching and fatigue.  She has been experiencing vaginal itching primarily on the surface for about a week. There is no associated discharge, bleeding, or rash. She does not recall having similar symptoms frequently in the past.  She describes significant fatigue and lack of energy, stating she has no energy at all. She feels exhausted and has trouble sleeping, often feeling the need to lie down.  Earlier this month, she was hospitalized due to a severe earache, during which blood clots were discovered. She was started on Eliquis , which she takes twice daily. She recalls being confused and unable to speak properly upon arrival at the hospital, leading to a suspicion of stroke. The patient recalls being told that blood clots were found in her lungs during her hospitalization.  She experienced shortness of breath for about a week to ten days prior to her hospitalization, severe enough to require her to stop and rest while walking down the hall.  She is planning to move to [place]  to be closer to her sons, who have been supportive during her recent health issues. She is concerned about managing her pain medications after the move, as she will need to find a new healthcare provider in [place] .  She is currently taking Eliquis  and fenofibrate . She also mentions using eye drops and referenced a dietitian named Geigengack during the visit. She is concerned about a possible urinary tract infection and has provided a urine  sample for testing.          03/17/2024    9:07 AM 03/04/2024   11:58 AM 01/09/2024    9:11 AM  Depression screen PHQ 2/9  Decreased Interest 0 0 0  Down, Depressed, Hopeless 0 0 0  PHQ - 2 Score 0 0 0  Altered sleeping 1  1  Tired, decreased energy 2  1  Change in appetite 0  0  Feeling bad or failure about yourself  0  0  Trouble concentrating 0  0  Moving slowly or fidgety/restless 0  0  Suicidal thoughts 0  0  PHQ-9 Score 3  2   Difficult doing work/chores Not difficult at all  Not difficult at all     Data saved with a previous flowsheet row definition    History Brittany Mercer has a past medical history of Acute deep vein thrombosis (DVT) of left peroneal vein (HCC) (02/27/2024), Acute deep vein thrombosis (DVT) of popliteal vein of left lower extremity (HCC) (02/27/2024), Acute deep vein thrombosis (DVT) of right femoral vein (HCC) (02/27/2024), Acute pulmonary embolism (HCC) (02/26/2024), Allergy, Cataract, Chronic narcotic use, Headache, Hereditary and idiopathic peripheral neuropathy, Hyperlipidemia, Hypertension, Hypokalemia, OA (osteoarthritis), and Peripheral neuropathy.   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   Her family history includes Arthritis in her brother; Dementia in her father; Heart disease in her mother; Hyperlipidemia in her mother; Hypertension in her brother, father, and mother; Neuropathy in her father; Stroke in her mother.She reports that she quit smoking about 25 years ago. Her smoking use included cigarettes. She started smoking about 55 years ago. She  has a 15 pack-year smoking history. She has never used smokeless tobacco. She reports current alcohol use of about 4.0 standard drinks of alcohol per week. She reports that she does not use drugs.    ROS Review of Systems  Constitutional: Negative.   HENT:  Negative for congestion.   Eyes:  Negative for visual disturbance.  Respiratory:  Negative for shortness of breath.   Cardiovascular:  Negative for chest pain.  Gastrointestinal:  Negative for abdominal pain, constipation, diarrhea, nausea and vomiting.  Genitourinary:  Negative for difficulty urinating.  Musculoskeletal:  Negative for arthralgias and myalgias.  Neurological:  Negative for headaches.  Psychiatric/Behavioral:  Negative for sleep disturbance.     Objective:  BP 122/69   Pulse 62   Temp 98 F (36.7 C)   Ht 5' 9 (1.753 m)   Wt 156 lb (70.8 kg)   SpO2 99%   BMI 23.04 kg/m   BP Readings from Last 3 Encounters:  03/17/24 122/69  03/04/24 (!) 157/82  02/29/24 124/67    Wt Readings from Last 3 Encounters:  03/17/24 156 lb (70.8 kg)  03/04/24 162 lb (73.5 kg)  02/26/24 167 lb 8.8 oz (76 kg)     Physical Exam Physical Exam GENERAL: Alert, cooperative, well developed, no acute distress HEENT: Normocephalic, normal oropharynx, moist mucous membranes CHEST: Clear to auscultation bilaterally, No wheezes, rhonchi, or crackles CARDIOVASCULAR: Normal heart rate and rhythm, S1 and S2 normal without murmurs ABDOMEN: Soft, non-tender, non-distended, without organomegaly, Normal bowel sounds EXTREMITIES: No cyanosis or edema NEUROLOGICAL: Cranial nerves grossly intact, Moves all extremities without gross motor or sensory deficit   Assessment & Plan:  Vaginal irritation -     Urinalysis, Routine w reflex microscopic -     Urine Culture -     CBC with Differential/Platelet -     WET PREP FOR TRICH, YEAST, CLUE  Dysuria -     Urinalysis, Routine w reflex microscopic -     Urine Culture -     CBC with  Differential/Platelet -     WET PREP FOR TRICH, YEAST, CLUE  Hypokalemia -     CBC with Differential/Platelet -     CMP14+EGFR -     Potassium Chloride  ER; Take 1 tablet (10 mEq total) by mouth 2 (two) times daily.  Dispense: 180 tablet; Refill: 3  Idiopathic peripheral neuropathy -     CBC with Differential/Platelet -     CMP14+EGFR -     Nucynta ; Take 1 tablet (100 mg total) by mouth 4 (four) times daily.  Dispense: 120 tablet; Refill: 0  Mixed hyperlipidemia -     CMP14+EGFR -     Lipid panel  Other orders -     traZODone  HCl; Take 1 tablet (150 mg total) by mouth at bedtime.  Dispense: 90 tablet; Refill: 3 -     Metoprolol  Succinate ER; Take 1 tablet (50 mg total) by mouth daily. Take with or immediately following a meal.  Dispense: 90 tablet; Refill: 3 -     Fenofibrate ; Take 1 tablet (160 mg total) by mouth daily.  Dispense: 90 tablet;  Refill: 3 -     Apixaban ; Take 1 tablet (5 mg total) by mouth 2 (two) times daily. Start in January after you complete the Apixaban  starter pack. Do not start until you have finished the starter pack.  Dispense: 180 tablet; Refill: 1 -     Microscopic Examination    Assessment and Plan Assessment & Plan Vulvovaginal irritation and possible infection   She experiences itching primarily on the vulva's surface for about a week without discharge or bleeding, and no rash is present. A vaginal swab is ordered to rule out infection. If itching persists after swab results, she is advised to use over-the-counter yeast cream.  Urinary symptoms, rule out urinary tract infection   The urine test is negative for signs of infection, suggesting the itching may indicate a vaginal issue rather than a urinary tract infection. If the urine test is positive, treatment for a urinary tract infection will be initiated. If itching persists, a vaginal swab will be considered.  Extensive pulmonary embolism   She has extensive blood clots in all lung lobes, likely  idiopathic, with no signs of cancer in the abdomen or pelvis. Symptoms include fatigue and shortness of breath. Eliquis  is prescribed to prevent further clotting and aid in clot dissolution. Clots are expected to dissolve over time with treatment. The condition could have been fatal, emphasizing the importance of adherence to medication. She should continue Eliquis  twice daily, and blood count, chemistry, and cholesterol tests are ordered. Immediate medical attention is advised if bleeding occurs.  Chronic pain due to idiopathic peripheral neuropathy and lumbar spondylosis   Chronic pain management is complicated by an upcoming move to [place] . Current management includes Nucynta  and Naloxone, with difficulty managing pain medications across state lines. The pain medication prescription is renewed for three months. She is advised to find a new pain management provider in [place] , and the potential for a pharmacy to accept an out-of-state prescription is discussed.  Mixed hyperlipidemia   Management continues with fenofibrate . She should continue fenofibrate .       Follow-up: Return in about 3 months (around 06/15/2024).  Butler Der, M.D. "

## 2024-03-20 LAB — URINE CULTURE

## 2024-03-23 ENCOUNTER — Other Ambulatory Visit: Payer: Self-pay | Admitting: Family Medicine

## 2024-03-23 ENCOUNTER — Other Ambulatory Visit (HOSPITAL_BASED_OUTPATIENT_CLINIC_OR_DEPARTMENT_OTHER): Payer: Self-pay

## 2024-03-23 ENCOUNTER — Ambulatory Visit: Payer: Self-pay | Admitting: Family Medicine

## 2024-03-23 MED ORDER — AMOXICILLIN 500 MG PO CAPS
500.0000 mg | ORAL_CAPSULE | Freq: Three times a day (TID) | ORAL | 0 refills | Status: DC
  Filled 2024-03-23: qty 21, 7d supply, fill #0

## 2024-03-24 ENCOUNTER — Other Ambulatory Visit (HOSPITAL_BASED_OUTPATIENT_CLINIC_OR_DEPARTMENT_OTHER): Payer: Self-pay

## 2024-04-07 ENCOUNTER — Other Ambulatory Visit: Payer: Self-pay

## 2024-04-07 ENCOUNTER — Other Ambulatory Visit (HOSPITAL_BASED_OUTPATIENT_CLINIC_OR_DEPARTMENT_OTHER): Payer: Self-pay

## 2024-04-08 ENCOUNTER — Other Ambulatory Visit (HOSPITAL_BASED_OUTPATIENT_CLINIC_OR_DEPARTMENT_OTHER): Payer: Self-pay

## 2024-04-11 ENCOUNTER — Ambulatory Visit: Payer: Self-pay

## 2024-04-11 NOTE — Telephone Encounter (Signed)
 Appt made

## 2024-04-11 NOTE — Telephone Encounter (Signed)
 FYI Only or Action Required?: FYI only for provider: appointment scheduled on 04/14/24.  Patient was last seen in primary care on 03/17/2024 by Zollie Lowers, MD.  Called Nurse Triage reporting Dysuria and Otalgia.  Symptoms began several days ago.  Interventions attempted: OTC medications: Acetaminophen .  Symptoms are: unchanged.  Triage Disposition: See Physician Within 24 Hours (overriding See HCP Within 4 Hours (Or PCP Triage)), See Physician Within 24 Hours (overriding See HCP Within 4 Hours (Or PCP Triage))  Patient/caregiver understands and will follow disposition?: Yes  Reason for Disposition  Age > 50 years  [1] SEVERE pain (e.g., excruciating) and [2] not improved 2 hours after pain medicine (e.g., acetaminophen  or ibuprofen)  Answer Assessment - Initial Assessment Questions Patient states that she was recently treated for a UTI. Symptoms went away for about 5 day and then she started to have burning with urination again about 5 days ago. Office visit advised. Advised to go to UC/ED if symptoms worsen.   1. SEVERITY: How bad is the pain?  (e.g., Scale 1-10; mild, moderate, or severe)     6/10  2. FREQUENCY: How many times have you had painful urination today?      Unknown  3. PATTERN: Is pain present every time you urinate or just sometimes?      Yes  4. ONSET: When did the painful urination start?      5 days ago  5. FEVER: Do you have a fever? If Yes, ask: What is your temperature, how was it measured, and when did it start?     No  6. PAST UTI: Have you had a urine infection before? If Yes, ask: When was the last time? and What happened that time?      Had recent UTI a few weeks ago  7. CAUSE: What do you think is causing the painful urination?  (e.g., UTI, scratch, Herpes sore)     UTI  8. OTHER SYMPTOMS: Do you have any other symptoms? (e.g., blood in urine, flank pain, genital sores, urgency, vaginal discharge)     States she may have  seen a small spot of blood once  9. PREGNANCY: Is there any chance you are pregnant? When was your last menstrual period?     NA  Answer Assessment - Initial Assessment Questions Patient states that she started to have pain around left ear about 5 days ago. Rates it as 8/10 pain and has taken Acetaminophen  with no relief. She is also experiencing headaches. She states she previously had pain like this on the right side and mentions being hospitalized for this and having blood clots. Office visit or UC advised today. No appts available, patient states she'd like  to come in on Monday due to not feeling well today. Office visit scheduled.   1. LOCATION: Which ear is involved?     Left  2. ONSET: When did the ear pain start?      5 days ago  3. SEVERITY: How bad is the pain?  (Scale 1-10; mild, moderate or severe)     8/10  4. URI SYMPTOMS: Do you have a runny nose or cough?     No  5. FEVER: Do you have a fever? If Yes, ask: What is your temperature, how was it measured, and when did it start?     No  6. CAUSE: Have you been swimming recently?, How often do you use Q-TIPS?, Have you had any recent air travel or scuba diving?  No to above  7. OTHER SYMPTOMS: Do you have any other symptoms? (e.g., decreased hearing, dizziness, headache, stiff neck, vomiting)     Headache, mild stiff neck, has been nauseous but states this is normal for her with earache or headache  8. PREGNANCY: Is there any chance you are pregnant? When was your last menstrual period?     NA  Protocols used: Urination Pain - Female-A-AH, Earache-A-AH  Reason for Triage: Really bad earache and UTI sensitivity & burning and maybe a dot of blood in urine

## 2024-04-14 ENCOUNTER — Other Ambulatory Visit (HOSPITAL_BASED_OUTPATIENT_CLINIC_OR_DEPARTMENT_OTHER): Payer: Self-pay

## 2024-04-14 ENCOUNTER — Ambulatory Visit: Admitting: Family Medicine

## 2024-04-14 ENCOUNTER — Encounter: Payer: Self-pay | Admitting: Family Medicine

## 2024-04-14 VITALS — BP 143/80 | HR 62 | Temp 97.2°F | Ht 69.0 in | Wt 162.0 lb

## 2024-04-14 DIAGNOSIS — M47816 Spondylosis without myelopathy or radiculopathy, lumbar region: Secondary | ICD-10-CM | POA: Diagnosis not present

## 2024-04-14 DIAGNOSIS — R300 Dysuria: Secondary | ICD-10-CM | POA: Diagnosis not present

## 2024-04-14 DIAGNOSIS — I10 Essential (primary) hypertension: Secondary | ICD-10-CM

## 2024-04-14 DIAGNOSIS — G609 Hereditary and idiopathic neuropathy, unspecified: Secondary | ICD-10-CM

## 2024-04-14 DIAGNOSIS — H6502 Acute serous otitis media, left ear: Secondary | ICD-10-CM

## 2024-04-14 LAB — URINALYSIS, ROUTINE W REFLEX MICROSCOPIC
Bilirubin, UA: NEGATIVE
Glucose, UA: NEGATIVE
Ketones, UA: NEGATIVE
Nitrite, UA: NEGATIVE
Protein,UA: NEGATIVE
RBC, UA: NEGATIVE
Specific Gravity, UA: 1.015 (ref 1.005–1.030)
Urobilinogen, Ur: 0.2 mg/dL (ref 0.2–1.0)
pH, UA: 7 (ref 5.0–7.5)

## 2024-04-14 LAB — MICROSCOPIC EXAMINATION
Bacteria, UA: NONE SEEN
Epithelial Cells (non renal): NONE SEEN /HPF (ref 0–10)
RBC, Urine: NONE SEEN /HPF (ref 0–2)
Renal Epithel, UA: NONE SEEN /HPF
Yeast, UA: NONE SEEN

## 2024-04-14 MED ORDER — AMOXICILLIN-POT CLAVULANATE 875-125 MG PO TABS
1.0000 | ORAL_TABLET | Freq: Two times a day (BID) | ORAL | 0 refills | Status: AC
  Filled 2024-04-14: qty 20, 10d supply, fill #0

## 2024-04-14 MED ORDER — NUCYNTA 100 MG PO TABS: 100.0000 mg | ORAL_TABLET | Freq: Four times a day (QID) | ORAL | 0 refills | Status: AC

## 2024-04-14 NOTE — Progress Notes (Signed)
 "  Subjective:  Patient ID: Brittany Mercer, female    DOB: [birth year]  Age: 79 y.o. MRN: [id number]  CC: Dysuria (Was on abx for UTI and 5  days after she started having symptoms gain. Sensitivity and urgency. No pain. ) and Otalgia (Left ear for a little over a week. Hurts all around the ear and much worse at night. Making it  hard to sleep. Debrox last night. )   HPI  Discussed the use of AI scribe software for clinical note transcription with the patient, who gave verbal consent to proceed.  History of Present Illness Brittany Mercer is a 79 year old female who presents with left ear pain and discomfort.  She has been experiencing left ear pain and discomfort for the past ten days, which worsens at night when lying down, affecting her sleep. The pain is fluctuating, with varying intensity on different days. Over-the-counter medications have been used but are inconsistently effective.  She has a history of discomfort around the ear area, which previously led her to visit the emergency room in December. Currently, she experiences tenderness behind the ear.  She is taking Nucynta  for pain management but has been taking half doses due to supply issues. She is also on a blood thinner.  She is in the process of moving to [place] , which is causing her anxiety. She has lived in the area before and hopes to feel at home once she relocates.  She has difficulty sleeping due to ear pain and is unsure if her hearing has been affected, as she already has poor hearing.          04/14/2024   10:04 AM 03/17/2024    9:07 AM 03/04/2024   11:58 AM  Depression screen PHQ 2/9  Decreased Interest 0 0 0  Down, Depressed, Hopeless 0 0 0  PHQ - 2 Score 0 0 0  Altered sleeping 1 1   Tired, decreased energy 1 2   Change in appetite 0 0   Feeling bad or failure about yourself  0 0   Trouble concentrating 0 0   Moving slowly or fidgety/restless 0 0   Suicidal thoughts 0 0    PHQ-9 Score 2 3   Difficult doing work/chores Not difficult at all Not difficult at all     History Ambyr has a past medical history of Acute deep vein thrombosis (DVT) of left peroneal vein (HCC) (02/27/2024), Acute deep vein thrombosis (DVT) of popliteal vein of left lower extremity (HCC) (02/27/2024), Acute deep vein thrombosis (DVT) of right femoral vein (HCC) (02/27/2024), Acute pulmonary embolism (HCC) (02/26/2024), Allergy, Cataract, Chronic narcotic use, Headache, Hereditary and idiopathic peripheral neuropathy, Hyperlipidemia, Hypertension, Hypokalemia, Laceration of face with complication (03/28/2022), OA (osteoarthritis), Peripheral neuropathy, and SDH (subdural hematoma) (HCC) (02/23/2023).   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   Her family history includes Arthritis in her brother; Dementia in her father; Heart disease in her mother; Hyperlipidemia in her mother; Hypertension in her brother, father, and mother; Neuropathy in her father; Stroke in her mother.She reports that she quit smoking about 25 years ago. Her smoking use included cigarettes. She started smoking about 55  years ago. She has a 15 pack-year smoking history. She has never used smokeless tobacco. She reports current alcohol use of about 4.0 standard drinks of alcohol per week. She reports that she does not use drugs.    ROS Review of Systems  Constitutional: Negative.   HENT: Negative.    Eyes:  Negative for visual disturbance.  Respiratory:  Negative for shortness of breath.   Cardiovascular:  Negative for  chest pain.  Gastrointestinal:  Negative for abdominal pain.  Musculoskeletal:  Positive for arthralgias.    Objective:  BP (!) 143/80   Pulse 62   Temp (!) 97.2 F (36.2 C)   Ht 5' 9 (1.753 m)   Wt 162 lb (73.5 kg)   SpO2 99%   BMI 23.92 kg/m   BP Readings from Last 3 Encounters:  04/14/24 (!) 143/80  03/17/24 122/69  03/04/24 (!) 157/82    Wt Readings from Last 3 Encounters:  04/14/24 162 lb (73.5 kg)  03/17/24 156 lb (70.8 kg)  03/04/24 162 lb (73.5 kg)     Physical Exam Physical Exam GENERAL: Alert, cooperative, well developed, no acute distress. HEENT: Normocephalic, normal oropharynx, moist mucous membranes. Fluid in left ear, no redness. Tenderness in sinuses. CHEST: Clear to auscultation bilaterally. No wheezes, rhonchi, or crackles. CARDIOVASCULAR: Normal heart rate and rhythm. S1 and S2 normal without murmurs. ABDOMEN: Soft, non-tender, non-distended, without organomegaly. Normal bowel sounds. EXTREMITIES: No cyanosis or edema. NEUROLOGICAL: Cranial nerves grossly intact. Moves all extremities without gross motor or sensory deficit.   Assessment & Plan:  Dysuria -     Urinalysis, Routine w reflex microscopic -     Urine Culture  Idiopathic peripheral neuropathy -     Nucynta ; Take 1 tablet (100 mg total) by mouth 4 (four) times daily.  Dispense: 120 tablet; Refill: 0  Lumbar spondylosis  Non-recurrent acute serous otitis media of left ear  Essential hypertension, benign  Other orders -     Amoxicillin -Pot Clavulanate; Take 1 tablet by mouth 2 (two) times daily. Take all of this medication  Dispense: 20 tablet; Refill: 0    Assessment and Plan Assessment & Plan Acute otitis media   Left ear pain for 10 days with fluid and tenderness behind the ear indicates infection, though no redness is observed. Hearing remains unclear due to pre-existing issues. Prescribe Augmentin  twice daily with food for 10 days.  Dysuria   Intermittent discomfort with  no clear pattern. Urine specimen is normal, and culture results are pending to rule out infection. Await culture results and contact provider if symptoms do not improve.  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 She is taking half a dose to extend supply, with withdrawal symptoms anticipated if medication is stopped abruptly. Provided a paper prescription for Nucynta  dated January 22nd for use in [place] . Advised to attempt filling the prescription at a pharmacy in [place] , possibly one she has used in the past.       Follow-up: No follow-ups on file.  Butler Der, M.D. "

## 2024-04-16 ENCOUNTER — Other Ambulatory Visit (HOSPITAL_BASED_OUTPATIENT_CLINIC_OR_DEPARTMENT_OTHER): Payer: Self-pay

## 2024-04-16 ENCOUNTER — Other Ambulatory Visit (HOSPITAL_COMMUNITY): Payer: Self-pay

## 2024-04-16 LAB — URINE CULTURE

## 2024-04-28 ENCOUNTER — Other Ambulatory Visit (HOSPITAL_BASED_OUTPATIENT_CLINIC_OR_DEPARTMENT_OTHER): Payer: Self-pay

## 2024-04-28 ENCOUNTER — Other Ambulatory Visit (HOSPITAL_COMMUNITY): Payer: Self-pay

## 2024-05-02 ENCOUNTER — Encounter: Payer: Self-pay | Admitting: Family Medicine

## 2024-08-05 ENCOUNTER — Ambulatory Visit
